# Patient Record
Sex: Male | Born: 1960 | Race: White | Hispanic: No | Marital: Married | Smoking: Never smoker
Health system: Southern US, Community
[De-identification: ages and names within clinical notes are randomized; demographics above are authoritative.]

## PROBLEM LIST (undated history)

## (undated) DIAGNOSIS — C801 Malignant (primary) neoplasm, unspecified: Secondary | ICD-10-CM

## (undated) DIAGNOSIS — E785 Hyperlipidemia, unspecified: Secondary | ICD-10-CM

## (undated) DIAGNOSIS — I10 Essential (primary) hypertension: Secondary | ICD-10-CM

## (undated) DIAGNOSIS — D649 Anemia, unspecified: Secondary | ICD-10-CM

## (undated) DIAGNOSIS — I2510 Atherosclerotic heart disease of native coronary artery without angina pectoris: Secondary | ICD-10-CM

## (undated) DIAGNOSIS — K2950 Unspecified chronic gastritis without bleeding: Secondary | ICD-10-CM

## (undated) DIAGNOSIS — N529 Male erectile dysfunction, unspecified: Secondary | ICD-10-CM

## (undated) DIAGNOSIS — T82858A Stenosis of vascular prosthetic devices, implants and grafts, initial encounter: Secondary | ICD-10-CM

## (undated) DIAGNOSIS — F419 Anxiety disorder, unspecified: Secondary | ICD-10-CM

## (undated) DIAGNOSIS — K3184 Gastroparesis: Secondary | ICD-10-CM

## (undated) DIAGNOSIS — K2270 Barrett's esophagus without dysplasia: Secondary | ICD-10-CM

## (undated) DIAGNOSIS — K861 Other chronic pancreatitis: Secondary | ICD-10-CM

## (undated) DIAGNOSIS — D45 Polycythemia vera: Secondary | ICD-10-CM

## (undated) DIAGNOSIS — B3781 Candidal esophagitis: Secondary | ICD-10-CM

## (undated) DIAGNOSIS — G4730 Sleep apnea, unspecified: Secondary | ICD-10-CM

## (undated) DIAGNOSIS — R112 Nausea with vomiting, unspecified: Secondary | ICD-10-CM

## (undated) DIAGNOSIS — K219 Gastro-esophageal reflux disease without esophagitis: Secondary | ICD-10-CM

## (undated) DIAGNOSIS — Z9889 Other specified postprocedural states: Secondary | ICD-10-CM

## (undated) DIAGNOSIS — T7840XA Allergy, unspecified, initial encounter: Secondary | ICD-10-CM

## (undated) HISTORY — DX: Barrett's esophagus without dysplasia: K22.70

## (undated) HISTORY — PX: NASAL SEPTUM SURGERY: SHX37

## (undated) HISTORY — DX: Other chronic pancreatitis: K86.1

## (undated) HISTORY — DX: Anemia, unspecified: D64.9

## (undated) HISTORY — DX: Unspecified chronic gastritis without bleeding: K29.50

## (undated) HISTORY — DX: Male erectile dysfunction, unspecified: N52.9

## (undated) HISTORY — DX: Hyperlipidemia, unspecified: E78.5

## (undated) HISTORY — DX: Sleep apnea, unspecified: G47.30

## (undated) HISTORY — DX: Gastroparesis: K31.84

## (undated) HISTORY — DX: Polycythemia vera: D45

## (undated) HISTORY — DX: Atherosclerotic heart disease of native coronary artery without angina pectoris: I25.10

## (undated) HISTORY — DX: Anxiety disorder, unspecified: F41.9

## (undated) HISTORY — PX: FINGER TENDON REPAIR: SHX1640

## (undated) HISTORY — PX: KNEE SURGERY: SHX244

## (undated) HISTORY — DX: Candidal esophagitis: B37.81

## (undated) HISTORY — PX: CARDIAC CATHETERIZATION: SHX172

## (undated) HISTORY — PX: CHOLECYSTECTOMY: SHX55

## (undated) HISTORY — DX: Gastro-esophageal reflux disease without esophagitis: K21.9

## (undated) HISTORY — DX: Allergy, unspecified, initial encounter: T78.40XA

## (undated) HISTORY — PX: APPENDECTOMY: SHX54

---

## 1985-07-31 HISTORY — PX: COLONOSCOPY: SHX174

## 2000-09-15 ENCOUNTER — Encounter: Admission: RE | Admit: 2000-09-15 | Discharge: 2000-09-15 | Payer: Self-pay | Admitting: Orthopedic Surgery

## 2000-09-15 ENCOUNTER — Encounter: Payer: Self-pay | Admitting: Orthopedic Surgery

## 2001-01-03 ENCOUNTER — Encounter: Payer: Self-pay | Admitting: Family Medicine

## 2001-01-03 ENCOUNTER — Ambulatory Visit (HOSPITAL_COMMUNITY): Admission: RE | Admit: 2001-01-03 | Discharge: 2001-01-03 | Payer: Self-pay | Admitting: Family Medicine

## 2001-01-26 ENCOUNTER — Inpatient Hospital Stay (HOSPITAL_COMMUNITY): Admission: EM | Admit: 2001-01-26 | Discharge: 2001-01-31 | Payer: Self-pay | Admitting: *Deleted

## 2001-01-26 ENCOUNTER — Encounter: Payer: Self-pay | Admitting: *Deleted

## 2001-01-30 ENCOUNTER — Encounter: Payer: Self-pay | Admitting: Family Medicine

## 2001-07-30 ENCOUNTER — Inpatient Hospital Stay (HOSPITAL_COMMUNITY): Admission: EM | Admit: 2001-07-30 | Discharge: 2001-08-05 | Payer: Self-pay | Admitting: *Deleted

## 2001-07-30 ENCOUNTER — Encounter: Payer: Self-pay | Admitting: *Deleted

## 2001-07-31 ENCOUNTER — Encounter: Payer: Self-pay | Admitting: Family Medicine

## 2001-09-19 ENCOUNTER — Ambulatory Visit (HOSPITAL_COMMUNITY): Admission: RE | Admit: 2001-09-19 | Discharge: 2001-09-19 | Payer: Self-pay | Admitting: Internal Medicine

## 2001-09-19 HISTORY — PX: ESOPHAGOGASTRODUODENOSCOPY: SHX1529

## 2001-09-21 ENCOUNTER — Encounter (INDEPENDENT_AMBULATORY_CARE_PROVIDER_SITE_OTHER): Payer: Self-pay | Admitting: Internal Medicine

## 2001-09-21 ENCOUNTER — Ambulatory Visit (HOSPITAL_COMMUNITY): Admission: RE | Admit: 2001-09-21 | Discharge: 2001-09-21 | Payer: Self-pay | Admitting: Internal Medicine

## 2001-11-07 ENCOUNTER — Ambulatory Visit (HOSPITAL_COMMUNITY): Admission: RE | Admit: 2001-11-07 | Discharge: 2001-11-07 | Payer: Self-pay | Admitting: Internal Medicine

## 2001-11-07 ENCOUNTER — Encounter (INDEPENDENT_AMBULATORY_CARE_PROVIDER_SITE_OTHER): Payer: Self-pay | Admitting: Internal Medicine

## 2002-04-03 ENCOUNTER — Ambulatory Visit (HOSPITAL_COMMUNITY): Admission: RE | Admit: 2002-04-03 | Discharge: 2002-04-03 | Payer: Self-pay | Admitting: Internal Medicine

## 2002-04-03 ENCOUNTER — Encounter (INDEPENDENT_AMBULATORY_CARE_PROVIDER_SITE_OTHER): Payer: Self-pay | Admitting: Internal Medicine

## 2002-08-07 ENCOUNTER — Encounter: Payer: Self-pay | Admitting: Family Medicine

## 2002-08-07 ENCOUNTER — Ambulatory Visit (HOSPITAL_COMMUNITY): Admission: RE | Admit: 2002-08-07 | Discharge: 2002-08-07 | Payer: Self-pay | Admitting: Family Medicine

## 2002-09-07 ENCOUNTER — Emergency Department (HOSPITAL_COMMUNITY): Admission: EM | Admit: 2002-09-07 | Discharge: 2002-09-07 | Payer: Self-pay | Admitting: Emergency Medicine

## 2003-07-22 ENCOUNTER — Emergency Department (HOSPITAL_COMMUNITY): Admission: EM | Admit: 2003-07-22 | Discharge: 2003-07-22 | Payer: Self-pay | Admitting: Emergency Medicine

## 2003-08-01 ENCOUNTER — Ambulatory Visit (HOSPITAL_COMMUNITY): Admission: RE | Admit: 2003-08-01 | Discharge: 2003-08-01 | Payer: Self-pay | Admitting: Family Medicine

## 2003-08-30 ENCOUNTER — Emergency Department (HOSPITAL_COMMUNITY): Admission: EM | Admit: 2003-08-30 | Discharge: 2003-08-30 | Payer: Self-pay | Admitting: Emergency Medicine

## 2004-01-20 ENCOUNTER — Ambulatory Visit (HOSPITAL_COMMUNITY): Admission: RE | Admit: 2004-01-20 | Discharge: 2004-01-20 | Payer: Self-pay | Admitting: Internal Medicine

## 2004-08-23 HISTORY — PX: CORONARY ANGIOPLASTY WITH STENT PLACEMENT: SHX49

## 2004-09-10 ENCOUNTER — Ambulatory Visit: Payer: Self-pay | Admitting: Internal Medicine

## 2004-10-15 ENCOUNTER — Inpatient Hospital Stay (HOSPITAL_COMMUNITY): Admission: EM | Admit: 2004-10-15 | Discharge: 2004-10-24 | Payer: Self-pay | Admitting: Cardiology

## 2004-10-15 ENCOUNTER — Ambulatory Visit: Payer: Self-pay | Admitting: Cardiology

## 2004-10-15 ENCOUNTER — Emergency Department (HOSPITAL_COMMUNITY): Admission: RE | Admit: 2004-10-15 | Discharge: 2004-10-15 | Payer: Self-pay | Admitting: Family Medicine

## 2004-10-28 ENCOUNTER — Ambulatory Visit (HOSPITAL_COMMUNITY): Admission: RE | Admit: 2004-10-28 | Discharge: 2004-10-28 | Payer: Self-pay | Admitting: Family Medicine

## 2004-11-02 ENCOUNTER — Encounter
Admission: RE | Admit: 2004-11-02 | Discharge: 2004-11-02 | Payer: Self-pay | Admitting: Thoracic Surgery (Cardiothoracic Vascular Surgery)

## 2004-11-16 ENCOUNTER — Ambulatory Visit: Payer: Self-pay | Admitting: Cardiology

## 2004-11-23 ENCOUNTER — Encounter
Admission: RE | Admit: 2004-11-23 | Discharge: 2004-11-23 | Payer: Self-pay | Admitting: Thoracic Surgery (Cardiothoracic Vascular Surgery)

## 2004-11-24 ENCOUNTER — Observation Stay (HOSPITAL_COMMUNITY)
Admission: RE | Admit: 2004-11-24 | Discharge: 2004-11-25 | Payer: Self-pay | Admitting: Thoracic Surgery (Cardiothoracic Vascular Surgery)

## 2004-11-24 ENCOUNTER — Encounter (INDEPENDENT_AMBULATORY_CARE_PROVIDER_SITE_OTHER): Payer: Self-pay | Admitting: *Deleted

## 2004-11-24 HISTORY — PX: CORONARY ARTERY BYPASS GRAFT: SHX141

## 2005-01-26 ENCOUNTER — Ambulatory Visit: Payer: Self-pay | Admitting: Cardiology

## 2005-03-15 ENCOUNTER — Encounter
Admission: RE | Admit: 2005-03-15 | Discharge: 2005-03-15 | Payer: Self-pay | Admitting: Thoracic Surgery (Cardiothoracic Vascular Surgery)

## 2005-06-15 ENCOUNTER — Ambulatory Visit: Payer: Self-pay | Admitting: Internal Medicine

## 2005-07-26 ENCOUNTER — Ambulatory Visit: Payer: Self-pay | Admitting: Cardiology

## 2006-01-11 ENCOUNTER — Ambulatory Visit: Payer: Self-pay | Admitting: Internal Medicine

## 2006-03-15 ENCOUNTER — Ambulatory Visit (HOSPITAL_COMMUNITY): Admission: RE | Admit: 2006-03-15 | Discharge: 2006-03-15 | Payer: Self-pay | Admitting: Family Medicine

## 2006-05-16 ENCOUNTER — Encounter
Admission: RE | Admit: 2006-05-16 | Discharge: 2006-05-16 | Payer: Self-pay | Admitting: Thoracic Surgery (Cardiothoracic Vascular Surgery)

## 2006-05-23 ENCOUNTER — Encounter
Admission: RE | Admit: 2006-05-23 | Discharge: 2006-05-23 | Payer: Self-pay | Admitting: Thoracic Surgery (Cardiothoracic Vascular Surgery)

## 2006-05-30 ENCOUNTER — Encounter: Payer: Self-pay | Admitting: Emergency Medicine

## 2006-05-30 ENCOUNTER — Ambulatory Visit: Payer: Self-pay | Admitting: Cardiology

## 2006-05-30 ENCOUNTER — Observation Stay (HOSPITAL_COMMUNITY): Admission: AD | Admit: 2006-05-30 | Discharge: 2006-06-01 | Payer: Self-pay | Admitting: Cardiology

## 2006-06-21 ENCOUNTER — Ambulatory Visit: Payer: Self-pay | Admitting: Cardiology

## 2006-07-01 ENCOUNTER — Ambulatory Visit: Payer: Self-pay | Admitting: Internal Medicine

## 2006-10-20 ENCOUNTER — Ambulatory Visit: Payer: Self-pay | Admitting: Cardiology

## 2006-10-20 ENCOUNTER — Ambulatory Visit: Payer: Self-pay | Admitting: *Deleted

## 2006-11-10 ENCOUNTER — Ambulatory Visit: Payer: Self-pay | Admitting: Cardiology

## 2007-04-13 ENCOUNTER — Ambulatory Visit: Payer: Self-pay | Admitting: Internal Medicine

## 2007-04-13 ENCOUNTER — Inpatient Hospital Stay (HOSPITAL_COMMUNITY): Admission: EM | Admit: 2007-04-13 | Discharge: 2007-04-17 | Payer: Self-pay | Admitting: Cardiology

## 2007-04-13 ENCOUNTER — Encounter: Payer: Self-pay | Admitting: Emergency Medicine

## 2007-05-09 ENCOUNTER — Ambulatory Visit: Payer: Self-pay | Admitting: Cardiology

## 2007-09-29 ENCOUNTER — Ambulatory Visit: Payer: Self-pay | Admitting: Cardiology

## 2009-04-10 ENCOUNTER — Ambulatory Visit (HOSPITAL_COMMUNITY): Admission: RE | Admit: 2009-04-10 | Discharge: 2009-04-10 | Payer: Self-pay | Admitting: Family Medicine

## 2009-05-20 ENCOUNTER — Ambulatory Visit: Payer: Self-pay | Admitting: Cardiology

## 2009-05-20 ENCOUNTER — Telehealth: Payer: Self-pay | Admitting: Cardiology

## 2009-05-20 ENCOUNTER — Observation Stay (HOSPITAL_COMMUNITY): Admission: RE | Admit: 2009-05-20 | Discharge: 2009-05-21 | Payer: Self-pay | Admitting: Cardiology

## 2009-05-20 ENCOUNTER — Encounter: Payer: Self-pay | Admitting: Cardiology

## 2009-05-20 DIAGNOSIS — I2510 Atherosclerotic heart disease of native coronary artery without angina pectoris: Secondary | ICD-10-CM | POA: Insufficient documentation

## 2009-05-20 DIAGNOSIS — R079 Chest pain, unspecified: Secondary | ICD-10-CM

## 2009-05-20 DIAGNOSIS — E119 Type 2 diabetes mellitus without complications: Secondary | ICD-10-CM | POA: Insufficient documentation

## 2009-05-20 DIAGNOSIS — E785 Hyperlipidemia, unspecified: Secondary | ICD-10-CM

## 2009-05-21 ENCOUNTER — Encounter: Payer: Self-pay | Admitting: Cardiology

## 2009-05-30 ENCOUNTER — Encounter: Payer: Self-pay | Admitting: Cardiology

## 2009-06-10 ENCOUNTER — Telehealth: Payer: Self-pay | Admitting: Cardiology

## 2010-08-25 ENCOUNTER — Ambulatory Visit: Admit: 2010-08-25 | Payer: Self-pay | Admitting: Internal Medicine

## 2010-09-13 ENCOUNTER — Encounter: Payer: Self-pay | Admitting: Thoracic Surgery (Cardiothoracic Vascular Surgery)

## 2010-09-22 ENCOUNTER — Ambulatory Visit
Admission: RE | Admit: 2010-09-22 | Discharge: 2010-09-22 | Payer: Self-pay | Source: Home / Self Care | Attending: Internal Medicine | Admitting: Internal Medicine

## 2010-09-27 NOTE — Consult Note (Signed)
NAME:  [name]             ACCOUNT NO.:  [id number]  MEDICAL RECORD NO.:  [id number]           PATIENT TYPE:  LOCATION:                                 FACILITY:  PHYSICIAN:  [name], M.D.    DATE OF BIRTH:  04-19-1961  DATE OF CONSULTATION: DATE OF DISCHARGE:                                CONSULTATION   PRESENTING COMPLAINT:  Followup for chronic pancreatitis secondary to hypertriglyceridemia, chronic GERD/gastroparesis.  Last visit in [name] 2010 in [place].  [name] is 50 year old Caucasian male patient of Dr. Katharine Look who is here for scheduled visit.  He states he has been having intermittent pain in his left upper quadrant.  He had one such episode in [name]. He had back pain and nausea and symptoms lasted for 3 days.  He had a single episode of vomiting.  When this occurs, he goes on liquid diet and slowly he gets better.  He had milder episodes.  He has not been to Dr. Michelle Nas[facility] and emergency room during these visits.  He is presently taking Levaquin for prostatitis and sinusitis.  He continues to experience left-sided chest and shoulder and neck pain felt to be musculoskeletal and now rarely using naproxen.  He has a copy of blood work from June 23, 2010, his triglycerides is 847.  He is due to have blood work next week.  He states triglyceride level prior to this was close to 1100.  He denies fever, chills, or vomiting.  He did have a CAT scan by Dr. Sudie Bailey in August of 2010, and no abnormality was noted to his pancreas.  CURRENT MEDICATIONS: 1. Enalapril 2.5 mg daily. 2. Metoprolol 25 mg daily. 3. Lantus 48 units every night. 4. Pravastatin 40 mg daily. 5. Paroxetine 10 mg daily. 6. Plavix 37.5 mg daily. 7. Gemfibrozil 300 mg daily. 8. Pancrelipase 5000 three capsules each meal, one with snack. 9. Glipizide 10 mg b.i.d. 10.Metformin 1 g b.i.d. 11.Domperidone 10 mg b.i.d. 12.Nitroglycerin 0.4 mg sublingual  p.r.n. 13.Levofloxacin 500 mg daily. 14.Naproxen 500 mg daily p.r.n. which is occasional or Aleve 220 mg     daily p.r.n. 15.Prevacid 30 mg daily p.r.n. 16.Hydrocodone/APAP 7.5/650 b.i.d. p.r.n. 17.Diazepam 10 mg daily p.r.n. 18.Co Q10 one daily. 19.Vitamin D 50,000 units once a week.  OBJECTIVE:  VITAL SIGNS:  Weight 171 pounds, which is stable, he is 69- 1/2 inches tall, pulse 72 per minute, blood pressure 120/70, temp is 97.8. HEENT:  Conjunctivae is pink.  Sclerae nonicteric.  Oropharyngeal mucosa is normal.  No neck masses are noted. ABDOMEN:  Symmetrical.  Bowel sounds are normal.  He has mild tenderness in left upper quadrant and midepigastric region, but no organomegaly or masses noted. EXTREMITIES:  He does not have peripheral edema or clubbing.  LABORATORY DATA:  From June 23, 2010, bilirubin is 0.9, AP 53, SGOT 24, SGPT 15, albumin 4.2.  His cholesterol was 275, triglycerides 847, HDL 32.  His hemoglobin A1c was 7.9.  ASSESSMENT:  [name] has chronic pancreatitis secondary to hypertriglyceridemia complicated by pseudocysts, but these have completely resolved.  His last CT was over a year ago.  He may be experiencing mild episode of pancreatitis which is not surprising, given his triglyceride level is still very high.  Ideally, he should get it below 500, but at least keep it below 1000.  The risk of pancreatitis increases significantly with levels above 1000.  He is on subtherapeutic dose of gemfibrozil.  He is intolerant of NIACIN.  PLAN:  The patient advised to increase his gemfibrozil to at least 300 mg b.i.d.  He needs to go back to his walking or exercise schedule.  If he has another episode of severe epigastric pain, we will check his amylase and perhaps consider CT.  We will wait to get copy of his planned lipid profile.  Unless he has problems, he will return for OV in 1 year.     [name], M.D.     NR/MEDQ  D:  09/23/2010  T:  09/23/2010  Job:   [id number]  cc:   Mila Homer. Sudie Bailey, M.D. Fax: [phone]  Electronically Signed by [name] M.D. on 09/27/2010 01:33:36 PM

## 2010-10-14 ENCOUNTER — Ambulatory Visit: Payer: Self-pay | Admitting: Cardiology

## 2010-11-12 ENCOUNTER — Encounter: Payer: Self-pay | Admitting: Cardiology

## 2010-11-13 ENCOUNTER — Encounter: Payer: Self-pay | Admitting: Cardiology

## 2010-11-13 ENCOUNTER — Encounter: Payer: Self-pay | Admitting: *Deleted

## 2010-11-23 ENCOUNTER — Ambulatory Visit (INDEPENDENT_AMBULATORY_CARE_PROVIDER_SITE_OTHER): Payer: Medicare Other | Admitting: Cardiology

## 2010-11-23 ENCOUNTER — Encounter: Payer: Self-pay | Admitting: Cardiology

## 2010-11-23 VITALS — BP 140/90 | HR 64 | Ht 69.0 in | Wt 172.0 lb

## 2010-11-23 DIAGNOSIS — I2510 Atherosclerotic heart disease of native coronary artery without angina pectoris: Secondary | ICD-10-CM

## 2010-11-23 DIAGNOSIS — R079 Chest pain, unspecified: Secondary | ICD-10-CM

## 2010-11-23 DIAGNOSIS — E785 Hyperlipidemia, unspecified: Secondary | ICD-10-CM

## 2010-11-23 NOTE — Patient Instructions (Signed)
Follow up in 12 months with Dr Antoine Poche

## 2010-11-23 NOTE — Assessment & Plan Note (Signed)
He has not anginal musculoskeletal pain. No further workup is planned. He does feel occasional pounding heartbeats at night. He is reminded to take his p.m. Beta blocker which he sometimes forgets.

## 2010-11-23 NOTE — Assessment & Plan Note (Signed)
A stress perfusion study demonstrated no new ischemia in 2010. No change in therapy is indicated and he will continue with risk reduction. No further imaging is indicated.

## 2010-11-23 NOTE — Progress Notes (Signed)
HPI The patient presents for followup of his known coronary disease. Since I last saw him he has had no new cardiovascular problems. He did have a catheterization in 2010 with results described below. He has continued to have some sternal chest discomfort but no anginal pain. It actually been treated with nonsteroidals with some relief. He denies any new chest pressure, neck or arm discomfort. He denies any palpitations, presyncope or syncope. He has no weight gain or edema.  He has no new SOB, PND or orthopnea.    Allergies  Allergen Reactions  . Iodinated Diagnostic Agents   . Iohexol      Desc: HIVES,SOB NEEDS PREP.MEDS   . Penicillins   . Sulfonamide Derivatives     Current Outpatient Prescriptions  Medication Sig Dispense Refill  . aspirin 81 MG tablet Take 81 mg by mouth daily.        . clopidogrel (PLAVIX) 75 MG tablet Take 75 mg by mouth daily.        . diazepam (VALIUM) 10 MG tablet Take 10 mg by mouth. Take 1 tablet three times a day       . enalapril (VASOTEC) 10 MG tablet Take 2.5 mg by mouth daily.       Marland Kitchen gemfibrozil (LOPID) 600 MG tablet Take 300 mg by mouth daily.        Marland Kitchen glipiZIDE (GLUCOTROL) 10 MG tablet Take 10 mg by mouth 2 (two) times daily.        . hydrocodone-acetaminophen (LORCET PLUS) 7.5-650 MG per tablet Take 1 tablet by mouth every 6 (six) hours as needed.        . insulin glargine (LANTUS) 100 UNIT/ML injection Inject 35 Units into the skin at bedtime.        . lansoprazole (PREVACID) 30 MG capsule Take 30 mg by mouth as needed.        Marland Kitchen levofloxacin (LEVAQUIN) 500 MG tablet Take 500 mg by mouth daily.        . metFORMIN (GLUCOPHAGE) 1000 MG tablet Take 1,000 mg by mouth 2 (two) times daily.        . metoprolol (LOPRESSOR) 100 MG tablet Take 25 mg by mouth daily.       . naproxen sodium (ANAPROX) 220 MG tablet Take 220 mg by mouth as needed.        . nitroGLYCERIN (NITROSTAT) 0.4 MG SL tablet Place 0.4 mg under the tongue every 5 (five) minutes as needed.         . NON FORMULARY Take 10 mg by mouth daily. Motilium       . PANCRELIPASE, LIP-PROT-AMYL, 5000 UNITS CPEP Take 5,000 Units by mouth. Take 3 tablets before each meal and 2 tablets with a snack       . PARoxetine (PAXIL) 10 MG tablet Take 10 mg by mouth daily.        . pravastatin (PRAVACHOL) 40 MG tablet Take 40 mg by mouth at bedtime.        . promethazine (PHENERGAN) 25 MG tablet Take 25 mg by mouth as needed.        Marland Kitchen DISCONTD: niacin (NIASPAN) 500 MG CR tablet Take 1,000 mg by mouth at bedtime.        Marland Kitchen DISCONTD: Omega-3 Fatty Acids (FISH OIL MAXIMUM STRENGTH) 1200 MG CAPS Take 1 capsule by mouth at bedtime.        Marland Kitchen DISCONTD: RABEprazole (ACIPHEX) 20 MG tablet Take 20 mg by mouth daily.  Past Medical History  Diagnosis Date  . Coronary artery disease     Patent Stent to Circ.  Patent coronary bypass grafts with a free radial graft to left  . Diabetes mellitus   . Pancreatitis chronic     with pseudocyst  . Dyslipidemia     poorly controlled, probably related to his DM (Some of this is secondary to his inability to afford medications).  . ED (erectile dysfunction)     Past Surgical History  Procedure Date  . Appendectomy   . Cholecystectomy   . Knee surgery     left knee  . Nasal septum surgery   . Finger tendon repair     in middle finger  . Coronary artery bypass graft 11/24/2004  . Cardiac catheterization     ROS  PHYSICAL EXAM BP 140/90  Pulse 64  Ht 5\' 9"  (1.753 m)  Wt 172 lb (78.019 kg)  BMI 25.40 kg/m2 GENERAL:  Well appearing HEENT:  Pupils equal round and reactive, fundi not visualized, oral mucosa unremarkable NECK:  No jugular venous distention, waveform within normal limits, carotid upstroke brisk and symmetric, no bruits, no thyromegaly LYMPHATICS:  No cervical, inguinal adenopathy LUNGS:  Clear to auscultation bilaterally BACK:  No CVA tenderness CHEST:  Well healed sternotomy scar. HEART:  PMI not displaced or sustained,S1 and S2 within  normal limits, no S3, no S4, no clicks, no rubs, no murmurs ABD:  Flat, positive bowel sounds normal in frequency in pitch, no bruits, no rebound, no guarding, no midline pulsatile mass, no hepatomegaly, no splenomegaly EXT:  2 plus pulses throughout, no edema, no cyanosis no clubbing SKIN:  No rashes no nodules NEURO:  Cranial nerves II through XII grossly intact, motor grossly intact throughout PSYCH:  Cognitively intact, oriented to person place and time   EKG:  Normal sinus rhythm, rate 64, incomplete right bundle branch block, no acute ST-T wave changes  ASSESSMENT AND PLAN

## 2010-11-23 NOTE — Assessment & Plan Note (Signed)
I don't have his most recent lipid profile. However, he is on combination therapy and it appears that he is getting very aggressive therapy.

## 2010-11-25 ENCOUNTER — Encounter: Payer: Self-pay | Admitting: Cardiology

## 2010-11-27 LAB — TSH: TSH: 1.794 u[IU]/mL (ref 0.350–4.500)

## 2010-11-27 LAB — COMPREHENSIVE METABOLIC PANEL
AST: 36 U/L (ref 0–37)
CO2: 28 mEq/L (ref 19–32)
Calcium: 9.1 mg/dL (ref 8.4–10.5)
Chloride: 105 mEq/L (ref 96–112)
Creatinine, Ser: 0.73 mg/dL (ref 0.4–1.5)
GFR calc Af Amer: >60 mL/min (ref >60)
GFR calc non Af Amer: >60 mL/min (ref >60)
Glucose, Bld: 100 mg/dL — ABNORMAL HIGH (ref 70–99)
Total Bilirubin: 1.7 mg/dL — ABNORMAL HIGH (ref 0.3–1.2)

## 2010-11-27 LAB — GLUCOSE, CAPILLARY
Glucose-Capillary: 281 mg/dL — ABNORMAL HIGH (ref 70–99)
Glucose-Capillary: 340 mg/dL — ABNORMAL HIGH (ref 70–99)
Glucose-Capillary: 392 mg/dL — ABNORMAL HIGH (ref 70–99)
Glucose-Capillary: 88 mg/dL (ref 70–99)

## 2010-11-27 LAB — LIPID PANEL: Cholesterol: 221 mg/dL — ABNORMAL HIGH (ref 0–200)

## 2010-11-27 LAB — CBC
HCT: 49.2 % (ref 39.0–52.0)
HCT: 54.4 % — ABNORMAL HIGH (ref 39.0–52.0)
Hemoglobin: 16.9 g/dL (ref 13.0–17.0)
MCHC: 33.8 g/dL (ref 30.0–36.0)
MCHC: 34.3 g/dL (ref 30.0–36.0)
MCV: 86.5 fL (ref 78.0–100.0)
MCV: 87.4 fL (ref 78.0–100.0)
RBC: 5.68 MIL/uL (ref 4.22–5.81)
RBC: 6.22 MIL/uL — ABNORMAL HIGH (ref 4.22–5.81)
WBC: 4.6 10*3/uL (ref 4.0–10.5)
WBC: 5.5 10*3/uL (ref 4.0–10.5)

## 2010-11-27 LAB — CARDIAC PANEL(CRET KIN+CKTOT+MB+TROPI): Relative Index: INVALID (ref 0.0–2.5)

## 2010-11-27 LAB — APTT: aPTT: 29 seconds (ref 24–37)

## 2010-11-27 LAB — HEMOGLOBIN A1C: Hgb A1c MFr Bld: 7.7 % — ABNORMAL HIGH (ref 4.6–6.1)

## 2010-11-27 LAB — PROTIME-INR: INR: 0.9 (ref 0.00–1.49)

## 2011-01-05 NOTE — Cardiovascular Report (Signed)
NAMEDAYTON, KENLEY NO.:  [id number]   MEDICAL RECORD NO.:  [id number]           PATIENT TYPE:   LOCATION:                                 FACILITY:   PHYSICIAN:  Rollene Rotunda, MD, FACCDATE OF BIRTH:  November 06, 1960   DATE OF PROCEDURE:  04/17/2007  DATE OF DISCHARGE:                            CARDIAC CATHETERIZATION   PRIMARY CARE PHYSICIAN:  Dr. John Giovanni.   CARDIOLOGIST:  Dr. Antoine Poche.   PROCEDURE:  Left heart catheterization/coronary arteriography.   INDICATIONS:  A patient with chest pain suggestive of his previous  unstable angina.  He had previous bypass grafting and previous stenting  of his native circumflex in the AV groove.   PROCEDURE NOTE:  Left heart catheterization performed.  The right  femoral artery was cannulated using the anterior wall puncture.  A #6-  French arterial sheath was inserted via the modified Seldinger  technique.  A preformed Judkins and a pigtail catheter were utilized.  The patient tolerated the procedure well and left the lab in stable  condition.   RESULTS:  Hemodynamics LV 124/23, AO 121/90.  Coronaries:  The left main  was normal.  The LAD was large, wrapping the apex.  There was a long  proximal 80% stenosis.  There was mid 30% stenosis.  There was diffuse  apical moderate plaque.  A mid diagonal was moderate with 60% proximal  stenosis, a second diagonal was large and normal.  The circumflex in the  AV groove was a dominant vessel.  There was a patent stent in the mid  segment.  First and second obtuse marginal were small and normal.  Third  and fourth obtuse marginal were moderate size and normal.  The  posterolateral was large and it occluded at the ostium.  It was seen to  fill via vein graft.  The PDA was small to moderate size and normal.  The right coronary artery is a nondominant vessel with a mid 99%  stenosis.   Grafts:  A left radial to the posterolateral was widely patent.  LIMA to  the LAD was  widely patent.   Left ventriculogram:  Left ventriculogram was obtained in the RAO  projection.  The EF was 60% with mild inferior hypokinesis.   CONCLUSION:  Native three-vessel coronary artery disease.  Patent bypass  grafts.  Well-preserved ejection fraction.   PLAN:  The patient's pain is either nonanginal, as there is no objective  evidence of ischemia, or it could be related to small vessel disease.  He will continue with aggressive medical management and risk reduction.  I have discussed with Dr. Sudie Bailey the need to continue to rule out  nonanginal causes.      Rollene Rotunda, MD, [facility]  Electronically Signed     JH/MEDQ  D:  04/17/2007  T:  04/17/2007  Job:  [id number]   cc:   Mila Homer. Sudie Bailey, M.D.

## 2011-01-05 NOTE — Discharge Summary (Signed)
NAMEDANDRAE, KUSTRA NO.:  [id number]   MEDICAL RECORD NO.:  [id number]          PATIENT TYPE:  INP   LOCATION:  2028                         FACILITY:  [facility]   PHYSICIAN:  Rollene Rotunda, MD, FACCDATE OF BIRTH:  1960/09/30   DATE OF ADMISSION:  04/13/2007  DATE OF DISCHARGE:  04/17/2007                               DISCHARGE SUMMARY   PRIMARY CARDIOLOGIST:  Rollene Rotunda, MD, [facility]   PRIMARY CARE PHYSICIAN:  Mila Homer. Sudie Bailey, M.D.   DISCHARGE DIAGNOSIS:  Chest pain.   SECONDARY DIAGNOSES:  1. Coronary artery disease.      a.     Status post CABG x2.      b.     Status post PCI and stenting of the PDA with bare metal       stent in October 2007.  2. Hyperlipidemia.  3. Type 2 diabetes mellitus.  4. Chronic pancreatitis.  5. Erectile dysfunction.  6. GERD.  7. Hypertriglyceridemia.   ALLERGIES:  PENICILLIN, SULFA, REGLAN, INTOLERANCE TO SOME STATINS.   PROCEDURES:  Left heart cardiac catheterization.   HISTORY OF PRESENT ILLNESS:  A 50 year old Caucasian male with prior  history of CAD as outlined above who was in his usual state of health  until the morning of April 13, 2007, when he had mid sternal chest  discomfort with radiation to the left arm and left neck with associated  nausea.  Symptoms lasted approximately 2 hours prior to resolving with  sublingual nitroglycerin.  Unfortunately, had recurrent symptoms  prompting him to present to the [facility] ED.  He notes that his pain  was similar to his previous MI pain.  Cardiac markers were negative in  the ED and he was admitted for further evaluation.   HOSPITAL COURSE:  He had recurrent symptoms while hospitalized despite  ruling out.  Decision was made to perform cardiac catheterization.  He  had no recurrent pain over the weekend, and this morning underwent left  heart cardiac catheterization revealing patency of his LIMA to the LAD  as well as radial graft to the PL.  He had multivessel  native coronary  artery disease which was stable.  EF was 60% with mild inferior  hypokinesis.  Post catheterization he has not had recurrent symptoms and  he has been ambulating without difficulty.  He will be discharged home  today in satisfactory condition.   DISCHARGE LABORATORIES:  Hemoglobin 17.6, hematocrit 51.3, WBC 6.4,  platelets 187, MCV 82.9, neutrophils 63, sodium 134, potassium 4.5,  chloride 99, CO2 26, BUN 18, creatinine 1.01, glucose 256.  Total  bilirubin 123, alkaline phosphatase 41, AST 28, ALT 19, amylase 63,  albumin 3.6, CK 174, MB 0.9, troponin I 0.01, total cholesterol 163,  triglycerides 843, HDL 29, LDL unable to be calculated, calcium 9.6,  hemoglobin A1c 7.8.   DISPOSITION:  The patient is being discharged home today in good  condition.   FOLLOW-UP PLANS AND APPOINTMENTS:  He has follow-up with Dr. Antoine Poche on  September 16 at 3:45 p.m..  He is asked to follow-up Dr. Sudie Bailey as  previously scheduled.  DISCHARGE MEDICATIONS:  1. Fenofibrate 48 mg daily (new).  2. Lantus 42 units q.h.s.  3. Metoprolol 50 mg half tablet b.i.d.  4. Vytorin 10/04 mg daily.  5. Domperidone 10 mg one tablet b.i.d.  6. Glipizide 10 mg b.i.d.  7. Enalapril 2.5 mg daily.  8. Nitroglycerin 0.4 mg sublingual p.r.n. chest pain.  9. Prevacid 30 mg daily.  10.Aspirin 81 mg daily.  11.Fish oil one tablet daily.  12.Paxil 10 mg daily.  13.Plavix 75 mg daily.  14.Lipram 4500, three caps with meals, one to two caps with snacks.  15.Metformin 1000 mg b.i.d. to be resumed April 19, 2007.  16.Hydrocodone, Naproxen, diazepam and promethazine all as previously      prescribed.   PENDING LABORATORY STUDIES:  None.   DURATION DISCHARGE ENCOUNTER:  Thirty eight minutes including physician  time.      Nicolasa Ducking, ANP      Rollene Rotunda, MD, [facility]  Electronically Signed    CB/MEDQ  D:  04/17/2007  T:  04/17/2007  Job:  [id number]   cc:   Mila Homer. Sudie Bailey, M.D.

## 2011-01-05 NOTE — H&P (Signed)
[name], HOLTSCLAW NO.:  [id number]   MEDICAL RECORD NO.:  [id number]          PATIENT TYPE:  INP   LOCATION:  2904                         FACILITY:  [facility]   PHYSICIAN:  Bevelyn Buckles. Bensimhon, MDDATE OF BIRTH:  Nov 01, 1960   DATE OF ADMISSION:  04/13/2007  DATE OF DISCHARGE:                              HISTORY & PHYSICAL   PRIMARY CARDIOLOGIST:  Dr. Angelina Sheriff.   PRIMARY CARE PHYSICIAN:  Dr. Sudie Bailey in [place].   HISTORY OF PRESENT ILLNESS:  This is a 50 year old Caucasian male with  known history of coronary artery disease, coronary artery bypass graft  with left internal mammary artery to left anterior descending and left  radial artery to left posterolateral branch, with history of  percutaneous coronary intervention to the posterior descending artery in  2007, with also history of insulin-dependent diabetes mellitus, chronic  pancreatitis, and hyperlipidemia.  The patient began to feel some dull,  midsternal left-sided chest pain radiating to the left rib and left arm  today. However, he has been having some nagging pain over the last  couple of weeks in his chest, which he has ignored.  The patient has  chronic pancreatitis, and felt it was related to that.  The patient also  states that he did some yard work about a week ago, doing some hoeing,  et [employer], outside, and he was asymptomatic at that time.  Today the  patient has noticed some increasing intensity of the left arm pain, left-  sided chest pain, and radiating now to the left neck with numbness onto  the left side of his head, and associated nausea.  The pain lasted  approximately 2 hours this morning before the patient took a  nitroglycerin sublingually.  He said that it decreased the pain and  helped for about 10-15 minutes, but then the pain returned and increased  in intensity.  The patient waited an additional 2 hours and took a  second nitroglycerin because he was nauseated, and  did not want to put  anything in his stomach.  He did take the second nitroglycerin and it  did help transiently.  Right afterward, he had what he described as  almost falling.  The patient states that he went down to his knees, that  he broke the fall by catching himself before hitting the floor, and  regained his strength and sat in a chair for a few minutes until the  dizziness passed.  He thought it was related to taking the second  nitroglycerin.  The pain recurred and his dizziness went away.  He got  up and took a shower, got dressed, and walked about in his home, and the  pain continued to be nagging.  He went to a neighbor's house to take his  dog there to have them look after it, and drove himself to the emergency  room at [facility].  The patient was seen at [facility], and had an EKG  completed, revealing no acute ST-T wave abnormalities.  He was given  morphine IV, heparin, nitroglycerin, aspirin, and Lopressor 5 mg IV.  The pain was relieved.  The patient also admits to being on a recent  course of antibiotics for sinusitis and prostatitis, which he states was  Levaquin through his primary care physician.  The pain he describes, he  states was similar to pain prior to his coronary artery bypass graft and  stent 2 years ago.  As a result of his history and recurrent discomfort,  the patient was transferred to [facility] for further evaluation and  continued treatment.   The patient currently is complaining of some mild soreness in his chest  but no acute frank pain at this time.   REVIEW OF SYSTEMS:  Positive for some mild diaphoresis, headache, chest  pain, mild shortness of breath, one episode of near-syncope, but no loss  of consciousness.  He complains of numbness in his head and the left  side of his neck with associated nausea.  All other systems are reviewed  and found to be negative.   PAST MEDICAL HISTORY:  1. Includes coronary artery disease with severe 3-vessel  coronary      artery disease found by cardiac catheterization in 2006, with      subsequent coronary artery bypass grafting in 2006, with left      internal mammary artery to left anterior descending and left radial      artery to left posterolateral branch.  At that time, the      catheterization revealed a 40% circumflex stenosis, 80% posterior      descending artery, and right coronary artery being nondominant.      The patient had subsequent bare metal stent to the posterior      descending artery secondary to the 80% stenosis in 2007 per Dr.      Samule Ohm.  2. The patient has history of diabetes.  3. Chronic pancreatitis.  4. Hyperlipidemia.  5. Erectile dysfunction.   PAST SURGICAL HISTORY:  1. Coronary artery bypass grafting.  2. Appendectomy.  3. Cholecystectomy.  4. Left knee surgery.   SOCIAL HISTORY:  The patient lives in [place] with his wife.  He is  disabled secondary to coronary artery disease and pancreatitis.  He does  not smoke, he does not drink alcohol.  There is no drug use.   FAMILY HISTORY:  Nonsignificant secondary to current diagnoses.   CURRENT MEDICATIONS:  1. Metoprolol 25 mg b.i.d.  2. Lipram once a day.  3. Paroxetine 10 mg daily.  4. Glipizide 10 mg b.i.d.  5. Metformin 1000 mg b.i.d.  6. Lantus insulin 35 units subcutaneously daily.  7. Prevacid 30 mg daily.  8. Vytorin 10/40 mg once a day.  9. Enalapril 2.5 mg daily.  10.Plavix 75mg  daily.  11.Aspirin 81 mg daily.  12.Niacin 500 mg once a day.  13.Diazepam 10 mg t.i.d.  14.Hydrocodone 7.5/650 mg t.i.d. p.r.n.   ALLERGIES:  1. To intravenous contrast dye.  2. Penicillin.  3. Sulfa.  4. Reglan.  5. Intolerance to statins.   CURRENT LABORATORY DATA:  Sodium 136, potassium 3.8, chloride 106, CO2  22, BUN 15, creatinine 0.78, glucose 121.  Hemoglobin 17.6, hematocrit  51.3, white blood cell count 6.4, platelets 187.  CK 54, MB 1.2,  troponin 0.03.  Point of care markers:  Myoglobin  45.4, MB less than  1.0, troponin less than 0.05.   PHYSICAL EXAMINATION:  VITAL SIGNS:  Blood pressure 105/73, pulse 58,  respirations 16.  HEENT:  Head normocephalic, atraumatic.  Eyes:  Pupils are equal, round,  reactive to  light and accommodation.  Mucous membranes are now pink and  moist.  Tongue midline.  NECK:  Supple.  There is no jugular venous distention and no carotid  bruit appreciated.  CARDIOVASCULAR:  Regular rate and rhythm with 1/6 systolic murmur  auscultated.  Pulses are 2+ and equal bilaterally with no bruits  appreciated.  LUNGS:  Clear to auscultation without wheezes, rales, or rhonchi.  ABDOMEN:  Soft, nontender, with 2+ bowel sounds.  There is some mild  tenderness to palpation.  EXTREMITIES:  No clubbing, cyanosis or edema.  MUSCULOSKELETAL:  Pain is reproducible on palpation of the left chest,  radiating to the left arm, and also pain is reproducible with range of  motion of the left arm.  NEUROLOGIC:  Cranial nerves II-XII are intact.   EKG reveals normal sinus rhythm without evidence of acute ST-T wave  changes.   IMPRESSION:  1. Chest pain in a patient with known coronary artery disease.      a.     Status post coronary artery bypass grafting left internal       mammary artery to left anterior descending and radial artery to       left posterolateral branch in 2006.      b.     Status post bare metal stent to posterior descending artery.  2. Insulin-dependent diabetes mellitus.  3. Chronic pancreatitis.  4. Familial hypercholesterolemia.  5. History of erectile dysfunction.   PLAN:  The patient was seen and examined by myself and Dr. Arvilla Meres in intensive care unit after being transferred from [facility].  The chest pain has both typical and atypical features, primarily  atypical.  EKG is negative for acute ST-T wave changes.  Discussed  stress test versus catheterization.  Dr. Gala Romney favors stress  Myoview.  The patient will discuss  this with family and make a decision  concerning whether he wishes to proceed with catheterization or stress  test.  If cardiac enzymes are positive, will definitely need to be  scheduled for catheterization.  In the interim, we will discontinue  heparin, continue the nitroglycerin, cycle cardiac enzymes, check  amylase and lipase, and start the patient on Lovenox in lieu of heparin.  The patient will be followed in the ICU overnight, and further  recommendations based on hospital course, and the patient's response to  treatment.      Bettey Mare. Lyman Bishop, NP      Bevelyn Buckles. Bensimhon, MD  Electronically Signed    KML/MEDQ  D:  04/13/2007  T:  04/14/2007  Job:  [id number]   cc:   [name] Giovanni, MD

## 2011-01-05 NOTE — Assessment & Plan Note (Signed)
[facility]                            CARDIOLOGY OFFICE NOTE   NAME:[name], [name] ALEN                      MRN:          [id number]  DATE:09/29/2007                            DOB:          [birth year]    PRIMARY CARE PHYSICIAN:  Mila Homer. Sudie Bailey, M.D.   REASON FOR PRESENTATION:  A patient with coronary disease.   HISTORY OF PRESENT ILLNESS:  The patient presents for evaluation of the  above.  Since I last saw him he has had at least one episode of chest  discomfort, that was somewhat severe.  This was about one week ago.  He  took nitroglycerin for this.  It was somewhat atypical.  He did feel  uncomfortable the next day.  It is similar to previous pain he had, such  as pain with the last catheterization; when he was found to have disease  and we managed medically.  He says he has taken 3 nitroglycerin in the  last 6 months.  He has otherwise felt pretty well.  He is not describing  any chest pressure, neck or arm discomfort.  He has not been having any  palpitation, presyncope or syncope.  He has not been having any PND or  orthopnea.   PAST MEDICAL HISTORY:  1. Coronary artery disease:  (Catheterization on August 21      demonstrated left radial to PDA graft widely patent;  LIMA to the      LAD widely patent.  He had native three-vessel disease.  He had      normal left main.  The LAD had  proximal 80% stenosis and mid 30%      stenosis.  There was apical moderate plaque.  He had a diagonal      which had 60% proximal stenosis.  The circumflex had a stent in the      mid segment; this was patent.  Posterolateral was large and      occluded at the ostium; this was seen to fill via vein graft.  The      PDA was small to moderate sized and normal.  The right coronary      artery was nondominant, with mid 99% stenosis.  He was managed      medically.).  2. Diabetes mellitus.  3. Chronic pancreatitis with pseudocyst.  4. Poorly controlled  dyslipidemia, probably related to his diabetes.      (Some of this is secondary to his inability to afford medications).  5. Appendectomy.  6. Cholecystectomy.  7. Left knee surgery.  8. Repair of a deviated nasal septum.  9. Repair of tendons in the middle finger of his right hand.  10.Erectile dysfunction.   ALLERGIES AND INTOLERANCES:  CONTRAST DYE, PENICILLIN, SULFA and REGLAN.   MEDICATIONS:  1. Fenofibrate 54 mg daily.  2. Plavix 75 mg daily.  3. Enalapril 2.5 mg daily.  4. Vytorin 10/40.  5. Omega-3.  6. Prevacid 30 mg daily.  7. Lantus.  8. Metformin 1000 mg b.i.d.  9. Glipizide 10 mg b.i.d.  10.Paroxetine.  11.Domperidone.  12.__________.  13.Metoprolol 25  mg b.i.d.   REVIEW OF SYSTEMS:  As stated in the HPI, otherwise negative for other  systems.   PHYSICAL EXAMINATION:  The patient is in no distress.  Blood pressure  139/95, heart 74 and regular, weight 178 pounds, body mass index 25.  HEENT:  Eyes were unremarkable; pupils equal, round and reactive to  light.  Fundi not visualized.  Oral mucosa unremarkable.  NECK:  No jugular distention to 45 degrees.  Carotid upstroke brisk and  symmetrical.  No bruits or thyromegaly.  LYMPHATICS:  No cervical, axillary or inguinal adenopathy.  LUNGS:  Clear to auscultation bilaterally.  BACK:  No costovertebral angle tenderness.  CHEST:  Well-healed sternotomy scar.  HEART:  PMI not displaced or sustained.  S1 and S2 within normal limits;  with no S3 and no S4.  No clicks, rubs or murmurs.  ABDOMEN:  Obese, positive bowel sounds.  Normal frequency and pitch.  No  bruits, rebound, guarding or midline pulsatile mass.  No organomegaly.  SKIN:  No rash.  EXTREMITIES:  Pulses 2+ and no edema.   ASSESSMENT/PLAN:  1. CORONARY DISEASE.  He is having some atypical symptoms.  This is a      relatively stable pattern.  No further cardiovascular testing is      suggested.  He will continue with risk reduction.  2. DYSLIPIDEMIA.   He is having this followed closely by Dr. Sudie Bailey.      His triglycerides were 920 in December.  However, he was not taking      good control of his diabetes.  He was skimping on his medicines      because of cost.  Dr. Sudie Bailey is helping him with this.  Hopefully      he will get this into a reasonable level.   FOLLOW-UP:  We will see the patient back in 1 year, or sooner if needed.     Rollene Rotunda, MD, [facility]  Electronically Signed    JH/MedQ  DD: 09/29/2007  DT: 10/01/2007  Job #: [id number]   cc:   Mila Homer. Sudie Bailey, M.D.

## 2011-01-05 NOTE — Assessment & Plan Note (Signed)
[facility]                            CARDIOLOGY OFFICE NOTE   NAME:[name], [name] NANNEY                      MRN:          [id number]  DATE:05/09/2007                            DOB:          [birth year]    PRIMARY:  Dr. Sudie Bailey.   REASON FOR PRESENTATION:  Evaluate patient with chest pain and coronary  disease.   HISTORY OF PRESENT ILLNESS:  Patient was admitted on August 21 with  chest discomfort.  He subsequently had a cardiac catheterization that  demonstrated a left radial graft to a posterior lateral as widely  patent, a LIMA to an LAD was widely patent.  He has native 3 vessel  coronary disease with a normal left main, the LAD had a proximal 80%  stenosis, mid 30% stenosis.  There was apical moderate plaque.  A  diagonal had 60% proximal stenosis.  His circumflex had a stent in the  mid-segment.  It was patent.  It was a posterior lateral which was large  and occluded at the ostium.  This was seen to fill via the vein graft.  The PDA was small to moderate size and normal.  The right coronary  artery is nondominant with mid 99% stenosis.  He was managed medically.  Since that time he has done well.  He has had no recurrent chest  discomfort.  He is doing some activity.  He has had no new shortness of  breath.  He denies any PND or orthopnea.   PAST MEDICAL HISTORY:  1. Coronary artery disease as described above.  2. Diabetes mellitus.  3. Chronic pancreatitis with pseudocyst.  4. Familiar dyslipidemia.  5. Appendectomy.  6. Cholecystectomy.  7. Left knee surgery.  8. Repair of a deviated nasal septum.  9. Repair of tendons in the middle finger of the right hand.  10.Erectile dysfunction.   ALLERGIES:  CONTRAST DYE, PENICILLIN, SULFA, REGLAN.   MEDICATIONS:  1. Metoprolol 25 mg b.i.d.  2. Lipram.  3. Paroxetine 10 mg daily.  4. Glipizide 10 mg b.i.d.  5. Metformin 1000 mg b.i.d.  6. Lantus.  7. Prevacid 30 mg daily.  8. Vytorin  10/40 daily.  9. Enalapril 2.5 mg daily.  10.Plavix 75 mg daily.  11.Aspirin 81 mg daily.  12.Niacin 500 mg daily.  13.Diazepam 10 mg t.i.d.  14.Hydrocodone.   REVIEW OF SYSTEMS:  As stated in the HPI and otherwise negative for  other systems.   PHYSICAL EXAMINATION:  Patient is in no distress.  Blood pressure  127/91, heart rate 64 and regular.  HEENT:  Eyelids unremarkable, pupils equally round and reactive to  light, fundi not visualized.  NECK:  No jugular venous distention at 45 degrees, carotid upstroke  brisk and symmetrical, no bruits, no thyromegaly.  LYMPHATICS:  No adenopathy.  LUNGS:  Clear to auscultation bilaterally.  BACK:  No costovertebral angle tenderness.  CHEST:  Well-healed sternotomy scar.  HEART:  PMI not displaced or sustained, S1 and S2 within normal limits,  no S3, no S4, no clicks, no rubs, no murmurs.  ABDOMEN:  Flat, positive bowel sounds normal in  frequency and pitch, no  bruits, no rebound, no guarding, no midline pulsatile mass, no  organomegaly.  SKIN:  No rashes, no nodules.  EXTREMITIES:  With 2+ right radial pulse, absent left radial pulse with  a well-healed radial harvest site, no cyanosis, no clubbing, no edema.  NEURO:  Grossly intact.   EKG sinus rhythm, rate 60, incomplete right bundle branch block, no  acute ST-T wave changes.   ASSESSMENT/PLAN:  1. Coronary disease.  Patient is having no new symptoms consistent      with angina.  He is being managed with secondary risk reduction.  2. Hypertriglyceridemia, this is his predominant lipid abnormality.      He is due to have lipids checked again and may need an increase to      his fenofibrate.  He has been watching his diet.  This can be      followed by his primary care physician.  3. Diabetes mellitus per Dr. Sudie Bailey.  4. Followup, we will see the patient back in about 6 months or sooner      if needed.     [name] Rotunda, MD, [facility]  Electronically Signed    JH/MedQ  DD:  05/11/2007  DT: 05/11/2007  Job #: [id number]   cc:   [name]. Sudie Bailey, M.D.

## 2011-01-08 NOTE — Group Therapy Note (Signed)
[facility]  Patient:    [name], [name] Visit Number: [id number] MRN: [id number]          Service Type: MED Location: 3A A213 01 Attending Physician:  Rosalyn Charters Dictated by:   John Giovanni, M.D. Admit Date:  07/30/2001                               Progress Note  SUBJECTIVE:  Patient developed diffuse abdominal pain starting about four days prior to admission.  I had seen him in the office two days prior to that and had cut back his Duragesic patch from 50 mg q.3d. to 25 mg q.3d.  When the patient developed pain, he thought it was secondary to a lower-dose patch and within a couple of days, he increased the patch back to 50 mg q.3d., but then two days later he was still having abdominal pain, so he presented to the ER. He says the pain now is somewhat improved.  CT of the abdomen done two days ago did show multiple diverticula in the descending colon.  It was felt that he had multiple diverticula and acute diverticulitis.  His pancreas was absolutely normal.  (His biliary stent was removed three weeks ago.)  OBJECTIVE:  He is supine in bed, in no acute distress, oriented and alert. Today, his temperature is 99 degrees, pulse is 70, respiratory rate 20, blood pressure 103/58.  The heart is regular with no murmur, rate of 70.  Lungs are clear throughout.  Abdomen soft without hepatosplenomegaly or mass and although he has had diffuse tenderness, there is no severe tenderness or rebound.  Bowel sounds appear to be normal and there is no edema in the ankles.  ASSESSMENT: 1. Diverticulitis. 2. History of pancreatitis.  PLAN:  Continue current medications including IV Cipro and IV metronidazole. Decrease IV rate.  Hopefully, he will be able to be discharged home in two to three days. Dictated by:   John Giovanni, M.D. Attending Physician:  Rosalyn Charters DD:  08/01/01 TD:  08/01/01 Job: [id number]

## 2011-01-08 NOTE — Op Note (Signed)
NAMEJAHAAD, [name]               ACCOUNT NO.:  [id number]   MEDICAL RECORD NO.:  [id number]          PATIENT TYPE:  INP   LOCATION:  2870                         FACILITY:  [facility]   PHYSICIAN:  Salvatore Decent. Cornelius Moras, M.D. DATE OF BIRTH:  [birth year]   DATE OF PROCEDURE:  11/24/2004  DATE OF DISCHARGE:                                 OPERATIVE REPORT   PREOPERATIVE DIAGNOSIS:  Open sternal wound with exposed sternal wire.   POSTOPERATIVE DIAGNOSIS:  Open sternal wound with exposed sternal wire.   PROCEDURE:  Sternal wire removal, excisional debridement of sternal wound,  placement of vacuum-assisted closure device.   SURGEON:  Salvatore Decent. Cornelius Moras, M.D.   ANESTHESIA:  General.   BRIEF CLINICAL NOTE:  The patient is a 50 year old diabetic male who  underwent coronary artery bypass grafting x2 on October 20, 2004.  Over the  last two weeks prior to surgery, the patient has been followed carefully in  the office with an area of nonhealing portion of the inferior aspect of the  sternal wound.  Wound cultures have been obtained and have been sterile.  There is evidence of some fat necrosis inferiorly, and the wound is been  opened and packed.  Upon return to the office April 3, the wound appeared  very clean with healthy granulation tissue, although one could appreciate  that the inferior sternal wire is now exposed.  The patient is brought in  for planned sternal wire removal with wound debridement and subsequent  V.A.C. placement to assist with further wound healing.   OPERATIVE CONSENT:  The patient and his wife have been counseled at length  regarding the indications and potential benefits of wound debridement and  wire removal.  They understand the possibility that the sternum could become  unstable, although with exposed wire it is unlikely that the wound would  heal sufficiently without wire removal.  All of their questions have been  addressed.   OPERATIVE NOTE IN DETAIL:  The  patient is brought to the operating room on  the above-mentioned date and placed in the supine position on the operating  table.  Intravenous antibiotics are administered.  General endotracheal  anesthesia is induced uneventfully under the care and direction of Dr. Sheldon Silvan.  The patient's anterior chest is prepared with Betadine solution  after removal of the small dressing from the patient's existing open wound.   The wound is carefully inspected.  The exposed sternal wire is removed.  The  soft tissues around this area are debrided with a 15 blade knife. A second  sternal wire that is immediately adjacent is encountered, and this is  subsequently removed and both wires are sent to pathology for identification  purposes.  Further sharp debridement of a slight amount of exudate and  devitalized tissue is performed, although the wound itself looks very clean  with essentially healthy-appearing granulation tissue surrounding.  There is  no sign of sternal instability.  The wound is irrigated with copious saline  solution.  A vacuum-assisted closure device is placed after trimming a small size  sponge to an appropriate  size.  The V.A.C. device is hooked up to continuous  suction.   A Foley catheter is placed and removed while the patient remains under  general anesthesia to enter the patient's bladder.  The patient is extubated  in the operating room and transported to the recovery room in stable  condition.  There are no intraoperative complications.      CHO/MEDQ  D:  11/24/2004  T:  11/24/2004  Job:  [id number]

## 2011-01-08 NOTE — Discharge Summary (Signed)
[name], [name]               ACCOUNT NO.:  [id number]   MEDICAL RECORD NO.:  [id number]          PATIENT TYPE:  INP   LOCATION:  2915                         FACILITY:  [facility]   PHYSICIAN:  Rollene Rotunda, MD, FACCDATE OF BIRTH:  [birth year]   DATE OF ADMISSION:  05/30/2006  DATE OF DISCHARGE:  06/01/2006                                 DISCHARGE SUMMARY   PRIMARY CARDIOLOGIST:  Rollene Rotunda, M.D.   PRIMARY CARE PHYSICIAN:  Dr. Sudie Bailey in [place].   PRINCIPAL DIAGNOSIS:  Unstable angina.   SECONDARY DIAGNOSES:  1. Coronary artery disease.  2. Hyperlipidemia.  3. Type 2 diabetes mellitus.  4. Chronic pancreatitis.  5. Severe hypertriglyceridemia.  6. Pancreatic stenting in 2005.  7. Status post appendectomy.  8. Status post cholecystectomy.  9. History of deviated septum status post repair.  10.Status post right hand surgical procedure.   ALLERGIES:  IV CONTRAST, PENICILLIN, SULFA, REGLAN AND INTOLERANCE TO  STATINS.   PROCEDURES:  Left heart catheterization with successful PCI and stenting of  the distal left circumflex with placement of a 2.0 x 12 mm Multilink  MiniVision bare metal stent.   HISTORY OF PRESENT ILLNESS:  A 50 year old married white male with prior  history of CAD status post CABG x2 in February of 2006 who was in his usual  state of health until several weeks ago when he began to experience an  uneasy feeling in his chest.  He recently had a chest CT performed by Dr.  Tressie Stalker which showed no significant abnormalities.  He had recurrent  discomfort in his chest the morning of October 8 prompting him to present to  [facility] where an EKG showed sinus rhythm without any acute ST or  T abnormalities.  Cardiac markers were negative x1 and his discomfort was  relieved with sublingual nitroglycerin.  The decision was made to transfer  him to [facility] for further evaluation.   HOSPITAL COURSE:  [name] ruled out for MI by cardiac  markers.  On October  9, he was taken to the cardiac cath lab where he underwent left heart  cardiac catheterization revealing patency of the previously placed LIMA to  the first diagonal as well as the free radial artery to the left LAD.  He  was noted to have an 80% stenosis in the distal circumflex as well as an  occluded nondominant right coronary artery which was old.  Films were  reviewed by Dr. Samule Ohm and he underwent successful PCI and stenting of the  distal circumflex with placement of a 2.0 x 12 mm Multilink MiniVision bare  metal stent.  Of note, his EF was 60% without regional wall motion  abnormalities on left ventriculography.  Postprocedure, [name] was  monitored in the stepdown area where he has not had any recurrent chest  pain.  He has been maintained on aspirin and Plavix therapy as well as his  home medications and is being discharged home today in satisfactory  condition.   DISCHARGE LABS:  Hemoglobin 15.8, hematocrit 46.5, WBC 8.8, platelets 152,  sodium 135, potassium 4.3,  chloride 102, CO2 29, BUN 15, creatinine 1.0,  glucose 271, total bilirubin 1.1, alkaline phosphatase 50, AST 17, ALT 13,  total protein 5.4, albumin 3.0, calcium 8.6, CK 69, MB 1.0, troponin I 0.02.   DISPOSITION:  The patient is being discharged home today in good condition.   FOLLOWUP PLANS AND APPOINTMENTS:  He has a followup appointment with Dr.  Rollene Rotunda at our [facility] office on October 30 at 10:45 a.m.  He is  asked to follow up with his primary care physician, Dr. Sudie Bailey, in  approximately three to four weeks.   DISCHARGE MEDICATIONS:  1. Aspirin 325 mg q.d.  2. Plavix 75 mg q.d. x30 days.  3. Paxil 10 mg q.d.  4. Glipizide 10 mg b.i.d.  5. Enalapril 2.5 mg q.d.  6. Metformin 500 mg b.i.d. to be resumed June 02, 2006.  7. Vytorin 10/40 mg q.h.s.  8. Lipram three capsules t.i.d. with meals.  9. Metoprolol 50 mg b.i.d.  10.Lantus 35 units q.h.s.  11.Prevacid 30 mg  q.d.  12.Nitroglycerin 0.4 mg sublingual p.r.n. chest pain.   OUTSTANDING LABS AND STUDIES:  None.   DURATION OF DISCHARGE ENCOUNTER:  Fifty minutes including physician time.     ______________________________  Nicolasa Ducking, ANP    ______________________________  Rollene Rotunda, MD, [facility]    CB/MEDQ  D:  06/01/2006  T:  06/01/2006  Job:  [id number]   cc:   Dr. Sudie Bailey

## 2011-01-08 NOTE — Group Therapy Note (Signed)
[facility]  Patient:    [name], [name] Visit Number: [id number] MRN: [id number]          Service Type: MED Location: 3A Y782 01 Attending Physician:  Rosalyn Charters Dictated by:   John Giovanni, M.D. Admit Date:  07/30/2001                               Progress Note  SUBJECTIVE:  Patient is generally getting better.  He really started getting sick about a week ago and was admitted to the hospital three days ago.  Since then, he has gradually improved.  He still has some lower abdominal tenderness.  OBJECTIVE:  Temperature 97 degrees, pulse 86, respiratory rate 20, blood pressure 110/60.  He is semirecumbent in bed, in no acute distress, oriented and alert, well-developed, well-nourished.  His abdomen is soft without hepatosplenomegaly or mass but there is some mild tenderness, particularly in the left lower quadrant.  His skin turgor is normal.  Mucous membranes are moist.  ASSESSMENT:  Diverticulitis, much improved.  PLAN:  Since he is now urinating well, we will switch to a Heplock.  He has been getting injections of narcotic q.2h. and will discontinue this and put him back on his Duragesic 50 patch q.3d. and use Tylox for breakthrough pain. Hopefully, we will be able to discharge him in a couple of days.  DISCUSSION:  At his request, I am checking an ANA, given his family history of lupus (sister has this).Dictated by:   John Giovanni, M.D. Attending Physician:  Rosalyn Charters DD:  08/02/01 TD:  08/03/01 Job: [id number]

## 2011-01-08 NOTE — Discharge Summary (Signed)
[facility]  Patient:    [name], [name] Visit Number: [id number] MRN: [id number]          Service Type: MED Location: 3A Y782 01 Attending Physician:  Rosalyn Charters Dictated by:   John Giovanni, M.D. Admit Date:  07/30/2001 Disc. Date: 08/05/01                             Discharge Summary  This 50 year old man was admitted to the hospital with abdominal pain which was found to be secondary to diverticulitis.  He had a benign seven day hospitalization extending from July 30, 2001 through August 05, 2001.  Given his history of pancreatitis it was initially felt that he had recurrent pancreatitis.  His acute abdominal x-ray and PA chest were essentially normal except for chronic bronchitis changes in the lung.  The CT scan of the chest showed a normal noncontrast CT appearance of the pancreas with no evidence of pancreatitis, but changes along the descending colon felt to be representing an acute diverticulitis.  CT of the pelvis, again, showed diverticulitis findings.  Admission white cell count 3900, 55 neutrophils, 34 lymphs.  Recheck on August 03 2999 with 48 neutrophils and 38 lymphs.  MET-7 was acceptable. Lipase 18, amylase 56.  UA was negative.  Liver tests were normal.  Treatment consisted of IV normal saline 125 cc/hour, Dilaudid 2 mg IV q.2h. p.r.n. severe pain.  He is continued on Tricor 160 mg q.d., Paxil 20 mg q.d., Protonix 40 mg q.d., Pancrease three capsules with meals and one with snacks, Zocor 10 mg q.d., Xanax 0.5 mg q.i.d. p.r.n.  Accu-Cheks a.c. and h.s.  He was also given Reglan 10 mg IV q.6h. and when the diverticulitis was diagnosed he was started on Cipro 400 mg IV q.12h, Flagyl 500 mg IV q.8h.  By the third day his IV fluids were decreased to 50 ml/hour.  Also, since he was concerned about lupus in a family member we checked an ANA which was negative.  Sedimentation rate was 1.  Repeat MET-7 was normal.  By the  fourth day his I was decreased to 30 cc/hour and then discontinued and switched to Heplock.  After four days of Dilaudid this was discontinued.  He was put on Tylox q.4h. p.r.n. severe pain and his Duragesic 50 patch started back up again q.3 days.  That evening he became quite agitated and his Dilaudid had to be restarted at 2 mg IV just over the night.  Did well by the following morning, however, and seemed to be doing fine with his patch and Tylox and we were able to continue him off the Dilaudid at that point.  I also switched him from alprazolam 0.5 to diazepam 10 mg t.i.d. which is the benzodiazepine he usually uses at home. He was put on a low fat diet which he tolerated quite well the morning of discharge.  DISCHARGE DIAGNOSES: 1. Acute diverticulitis. 2. History of pancreatitis currently with a normal CT of the pancreas. 3. Diabetes related to the pancreatitis, but with stable blood sugars on his    a.c. and h.s. Accu-Cheks. 4. Hyperlipidemia. 5. Depression.  DISCHARGE MEDICATIONS: 1. Cipro 500 mg b.i.d. for seven day (14, no refills). 2. Flagyl 500 mg t.i.d. (20 with no refills). 3. Duragesic patch at home two days from tomorrow and then three days after    that will go from 50 patch to 25 patch as already arranged  outpatient    before he was admitted. 4. Go back on Pancrease three capsules with meals and one with snacks. 5. Tylox q.4h. 6. Zocor 10 mg q.d. 7. Tricor 160 mg q.d. 8. Protonix 40 mg q.d. 9. He has done well without Avandia in the hospital, see if he can do okay    without this at home.  Follow up in my office in about a week. Dictated by:   John Giovanni, M.D. Attending Physician:  Rosalyn Charters DD:  08/05/01 TD:  08/05/01 Job: [id number]

## 2011-01-08 NOTE — Procedures (Signed)
[name], [name] NO.:  [id number]   MEDICAL RECORD NO.:  [id number]           PATIENT TYPE:   LOCATION:  2027                          FACILITY:  [facility]   PHYSICIAN:  Edward L. Juanetta Gosling, M.D.DATE OF BIRTH:  [birth year]   DATE OF PROCEDURE:  10/15/2004  DATE OF DISCHARGE:                                EKG INTERPRETATION   TIME SEEN:  1453   EKG INTERPRETATION:  The rhythm is sinus rhythm with sinus arrythmia.  There  is an incomplete right bundle branch block.  There is a suggestion of a  chronic pulmonary disease pattern.  Q-waves are seen inferiorly and there  was T-wave inversion inferiorly which could indicate something acute but  there were no ST elevations definitely suggesting acute inferior MI.  Clinical correlation is suggested.  T-wave abnormalities were seen  anteriorly and laterally  Abnormal electrocardiogram.      ELH/MEDQ  D:  10/16/2004  T:  10/16/2004  Job:  [id number]

## 2011-01-08 NOTE — H&P (Signed)
[facility]  Patient:    [name], [name] Visit Number: [id number] MRN: [id number]          Service Type: MED Location: 3A [phone] Attending Physician:  Rosalyn Charters Dictated by:   Kari Baars, M.D. Admit Date:  07/30/2001                           History and Physical  REASON FOR ADMISSION:  Recurrent pancreatitis.  HISTORY OF PRESENT ILLNESS:  This is a 50 year old who has had a history of pancreatitis on multiple occasions.  He has had two hospitalizations fairly recently for this, and he has been being followed at [facility] by one of the gastroenterologist.  He recently had a stent removed and his wife says he was told that if had some fluid building up around his pancreas and if this did not clear that he would need to have a partial pancreatectomy.  He said that his pain is exactly what he has had in the past when he has had pancreatitis, and it has got worse over the last three to four days.  He says that he has not been able to eat.  His abdominal pain is 8/10.  He has received Dilaudid and says he is more comfortable.  He has not had any dysphagia.  He also was found to have an ulcer when he was at [facility] recently, and he appears to have had some exacerbation of his abdominal pain, perhaps related to his ulcer. The etiology of his pancreatitis appears to be hyperlipidemia.  MEDICATIONS:  He is on a large number of medications including:  1. Avandia 8 mg daily.  2. Amaryl that he takes on a p.r.n. basis.  3. Tricor 160 mg daily.  4. Zocor 10 mg daily.  5. Paxil 25 mg daily.  6. Fish oil tablets.  7. Tylox on a p.r.n. basis.  8. Duragesic patch (I do not know the size of that).  9. Prevacid 30 mg daily. 10. Pancrease three with meals and one with snacks.  PAST MEDICAL HISTORY:  In addition to his other problems he has diabetes which is related to his pancreatitis obviously.  Otherwise, he has a gastric ulcer, history of pancreatitis,  chronic prostate complaints.  ALLERGIES:  He is allergic to PENICILLIN, SULFA and IV DYES.  PAST SURGICAL HISTORY:  He has had an arthroscopy of the knee, a cholecystectomy, appendectomy, deviated septum repair, repair of the tendons in his middle fingers.  FAMILY HISTORY:  His mother died of a myocardial infarction.  There is a very strong positive family history of hyperlipidemia.  His father died from complications of gangrene in the leg, and his brother has also had problems with pancreatitis.  SOCIAL HISTORY:  He is married.  He does not use alcohol or tobacco.  He has not been able to work in the last 10 months or so because of problems with his abdomen.  PHYSICAL EXAMINATION:  GENERAL:  He appears to be very anxious.  Temperature 97, pulse 87, respirations 16, blood pressure 132/90.  HEENT:  His pupils are equal, round and reactive to light and accommodation. Mucous membranes are slightly dry.  NECK:  His neck is supple without masses, bruits or jugular venous distention.  CHEST: His chest is fairly clear without wheezes, rales or rhonchi.  HEART:  Regular without murmurs, gallops or rubs.  ABDOMEN:  His abdomen is soft without masses.  His abdomen is  indeed fairly soft right now.  He says it is much better since he has had his Dilaudid.  EXTREMITIES:  No edema.  LABORATORY DATA:  Lab work shows that his amylase and lipase are both normal. His metabolic-7 is normal.  Albumin normal.  Liver enzymes are normal.  His white blood count is 3900, hemoglobin 15.5.  ASSESSMENT:  He has abdominal pain but no definite evidence of pancreatitis. However, he certainly has a history of pancreatitis and very well may have problems with that again, and we may simply have not seen a rise in his enzyme levels as yet.  He is clearly having a lot of pain.  He is also very anxious which, of course, may be making his problems worse.  His diabetes is fairly well-controlled at this  point.  He does have a history of an ulcer, and that will need to be treated as well.  He sees a gastroenterologist at [facility] who had recently removed a stent and he actually discussed his situation with a gastroenterologist at [facility] today and was told that he should come here because there were no beds available at [facility] at this time.  PLAN:  The plan then is to get him into the hospital, give him pain medications as needed, put him on Protonix for his presumed ulcer, continue with treatments for his hyperlipidemia, continue with his Paxil for his depression.  Add Xanax as needed and repeat labs, etc., in the morning to see if his amylase and Pancrease have gone up at that time.  He may need to be transferred to [facility] for further evaluation by his gastroenterologist. Dictated by:   Kari Baars, M.D. Attending Physician:  Rosalyn Charters DD:  07/30/01 TD:  07/30/01 Job: [id number]

## 2011-01-08 NOTE — Cardiovascular Report (Signed)
NAMEVALERY, CHANCE NO.:  [id number]   MEDICAL RECORD NO.:  [id number]          PATIENT TYPE:  INP   LOCATION:                               FACILITY:  [facility]   PHYSICIAN:  Rollene Rotunda, M.D.   DATE OF BIRTH:  1960-11-01   DATE OF PROCEDURE:  DATE OF DISCHARGE:                              CARDIAC CATHETERIZATION   PRIMARY CARE PHYSICIAN:  Dr. Katharine Look.   PROCEDURE:  Left heart catheterization/coronary arteriography.   INDICATION:  Evaluate patient with unstable angina.   PROCEDURE NOTE:  Left heart catheterization was performed via the right  femoral artery.  The artery was cannulated using anterior wall puncture.  A  #6 French arterial sheath was inserted via the modified Seldinger technique.  Preformed Judkins and a pigtail catheter were utilized.  The patient  tolerated the procedure well and left the lab in stable condition.   RESULTS:  Hemodynamics:  LV 114/26, AO 115/81.  Coronaries of the left main  was normal.   The LAD was a large vessel wrapping the apex.  There was long proximal 80%  stenosis before a mid diagonal.  Following this there was a mid 30% stenosis  and diffuse luminal irregularities.  The first diagonal was a moderate-sized  vessel with proximal 60% stenosis.  The second diagonal was a large vessel  and was normal.   The circumflex in the AV groove had diffuse luminal irregularities.  There  was long 40% stenosis before posterolateral and PDA in the distal  circumflex.  The first two obtuse marginals were very small normal.  The  next two obtuse marginals were moderate-sized and normal.  A fifth obtuse  marginal was a very large vessel with long proximal and mid 95% stenosis and  TIMI-2 flow.  There was an 80% focal lesion at the takeoff of the moderate-  sized PDA.   The right coronary artery was a nondominant vessel.  It was occluded  proximally with scant bridging collaterals.   Left ventriculogram -- the left  ventriculogram was obtained in the RAO  projection.  The EF was 60% with mild inferior wall hypokinesis.   CONCLUSION:  Three-vessel coronary artery disease.  The lesions are long.  I  think this would best managed with coronary artery bypass grafting.  He will  also need management of his severe hypertriglyceridemia and diabetes.  We  will consult CVTS and discuss possible endocrinology consult with his  primary care physician.   PLAN:  I reviewed the films with Dr. Riley Kill.  The patient may best be  served with a left internal mammary artery to the left anterior descending  artery.       ___________________________________________  Rollene Rotunda, M.D.    JH/MEDQ  D:  10/19/2004  T:  10/19/2004  Job:  [id number]   cc:   Mila Homer. Sudie Bailey, M.D.  [address] [place], [place] [address]  Fax: [phone]

## 2011-01-08 NOTE — Cardiovascular Report (Signed)
NAMEJAKOBY, MELENDREZ NO.:  [id number]   MEDICAL RECORD NO.:  [id number]           PATIENT TYPE:   LOCATION:                                 FACILITY:   PHYSICIAN:  Salvadore Farber, MD  DATE OF BIRTH:  [birth year]   DATE OF PROCEDURE:  05/31/2006  DATE OF DISCHARGE:                              CARDIAC CATHETERIZATION   PROCEDURE:  1. Left heart catheterization.  2. Left ventriculography.  3. Coronary angiography.  4. Bypass graft angiography.  5. Bare metal stenting of the distal circumflex.  6. Star close closure of the right common femoral arteriotomy site.   INDICATIONS:  Mr. Latour is a 50 year old gentleman, status post coronary  artery bypass grafting surgery in February 2006, using a LIMA to the LAD and  a free radial graft to the left posterior left ventricular branch.  He has  been troubled by chronic chest pain since then which he describes as a  bruised feeling.  He now presents with sharp shooting pains radiating from  his chest to his left arm which is distinct from his chronic pain.  He was  admitted to hospital.  Troponin was minimally elevated 0.06.  He was  referred for diagnostic angiography and possible percutaneous coronary  intervention.   PROCEDURAL TECHNIQUE:  Informed consent was obtained.  Under 1% lidocaine  local anesthesia, a 5-French sheath was placed in the right common femoral  artery, using the modified Seldinger technique.  Coronary angiography of the  native vessels was performed using JL-4 and JR-4 catheters.  A JR-4 catheter  was used for the free radial graft and LIMA catheter for the left internal  mammary artery graft.  Left heart catheterization and ventriculography was  then performed using a pigtail catheter.  Images demonstrated an 80-85%  stenosis of the distal circumflex.  Decision was made to proceed to  percutaneous revascularization of this lesion.   Anticoagulation was initiated with bivalirudin.  ACT was  confirmed to be  greater than 225 seconds.  Alla Feeling Study drugs were administered per  protocol.  Sheath was up-sized over a wire to 6-French.  A 6-French Voda  left guide was advanced over wire engaging the ostium of the left main.  Prowater wire was advanced to the distal circumflex without difficulty.  The  lesion was pre-dilated using a 2 x 9-mm Maverick at 6 atmospheres.  He was  then stented using a 2 x 12-mm mini Vision at 14 atmospheres.  The stent was  then post-dilated using a 2.25 x 12-mm Quantum at 16 atmospheres.  Final  angiography demonstrated no residual stenosis, no dissection, and TIMI III  flow to the distal vasculature.   The arteriotomy was then closed using a Star close device.  Complete  hemostasis was obtained.  He was then transferred to the holding room in  stable condition having tolerated the procedure well.   COMPLICATIONS:  None.   FINDINGS:  1. LV:  136/6/10.  2. EF 60% without regional wall motion abnormality.  3. No aortic stenosis or mitral regurgitation.  4. Left subclavian artery, angiographically normal.  5. Left main, angiographically normal.  6. LAD:  Moderate-sized vessel with a long segment of 80% stenosis      proximally.  The LIMA graft does not in fact touchdown on the left      anterior descending artery.  Instead, it touches down a moderate-sized      diagonal which in turn fills the LAD.  Filling of the LAD is excellent.  7. Ramus intermedius:  Moderate-sized vessel with 50% proximal stenosis      and a 1.5-mm vessel.  8. Circumflex:  Fairly large dominant vessel.  There is a 40% stenosis      proximally.  There was then an 80% stenosis distally before the origin      of the left PDA.  This 80% stenosis was stented to no residual using a      bare metal stent.  There is a posterior left ventricular branch which      is totally occluded but is well supplied via the free radial graft      which is widely patent.  9. Right coronary:   Relatively small nondominant vessel.  It is totally      occluded proximally and has bridging collaterals.   IMPRESSION:  1. Patent left internal mammary artery to diagonal and free radial to      posterior left ventricular branch grafts.  2. Successful stenting of the native circumflex.   RECOMMENDATIONS:  The patient should be maintained on Plavix for 30 days and  aspirin indefinitely.      Salvadore Farber, MD  Electronically Signed     WED/MEDQ  D:  05/31/2006  T:  06/01/2006  Job:  [id number]   cc:   Mila Homer. Sudie Bailey, M.D.  Rollene Rotunda, MD, [facility]

## 2011-01-08 NOTE — Group Therapy Note (Signed)
[facility]  Patient:    [name], [name] Visit Number: [id number] MRN: [id number]          Service Type: MED Location: [id number] Attending Physician:  Rosalyn Charters Dictated by:   Kari Baars, M.D. Admit Date:  07/30/2001                               Progress Note  PRIMARY CARE PHYSICIAN:  Dr. John Giovanni.  PROBLEM:  Abdominal pain.  SUBJECTIVE:  [name] says he is feeling a little bit better this morning. He has had no new problems.  He has no fever or chills, no cough, no sputum production.  His abdomen is about the same as it has been.  His amylase and lipase are normal again, however, CT scan shows that his pancreas looks normal on the CT but his sigmoid colon has changes suggestive that he has sigmoid diverticulitis.  I think this would correspond with the clinical picture with left-sided pain.  He now says that his pain may not be exactly what he has had in the past with pancreatitis.  There is no inflammation in the pancreas and no evidence of pseudocyst.  OBJECTIVE:  His exam shows that he is still minimally tender.  He is afebrile with chest clear.  His heart is regular.  His abdomen is softer.  ASSESSMENT:  He has diverticulitis, not pancreatitis.  PLAN:  Plan is to have him start Flagyl 500 mg IV q.8h. and Cipro 400 mg IV q.12h.  I had told him that I could put him on Levaquin but he says that the last few times that he has taken Levaquin, it has caused him to have a lot of problems.  He says he thinks he tolerates Cipro. Dictated by:   Kari Baars, M.D. Attending Physician:  Rosalyn Charters DD:  07/31/01 TD:  08/01/01 Job: [id number]

## 2011-01-08 NOTE — Discharge Summary (Signed)
NAMEKAYSHAWN, OZBURN               ACCOUNT NO.:  [id number]   MEDICAL RECORD NO.:  [id number]          PATIENT TYPE:  INP   LOCATION:  2002                         FACILITY:  [facility]   PHYSICIAN:  Salvatore Decent. Cornelius Moras, M.D. DATE OF BIRTH:  [birth year]   DATE OF ADMISSION:  10/15/2004  DATE OF DISCHARGE:                                 DISCHARGE SUMMARY   PRIMARY DIAGNOSIS:  Coronary artery disease.   SECONDARY DIAGNOSES:  1.  Chronic pancreatitis initially discovered in 2002.  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  Abdominal      computed tomographic scan performed last week at [facility]      documented stable radiographic appearance of two small pseudocysts with      no other acute changes.  2.  Familial hyperlipidemia.  3.  Diabetes mellitus type 2.   OPERATIONS AND PROCEDURES:  1.  Left heart catheterization and coronary arteriography.  2.  Coronary artery bypass grafting x2 with a left internal mammary artery      to the distal left anterior descending coronary artery and left radial      artery to the left distal lateral branch.  Note this was an off pump      coronary artery bypass grafting.   ALLERGIES:  The patient states he is allergic to IV CONTRAST, PENICILLIN,  SULFA and REGLAN.   HISTORY OF PRESENT ILLNESS:  Mr. Witucki is a 50 year old white male with no  previous history of coronary artery disease.  Past medical history notable  for familial hyperlipidemia, pancreatitis secondary to hyperlipidemia, and  adult onset of type 2 diabetes mellitus with a strong family history of  coronary artery disease.  Mr. Monk reports that over the last 2 or 3 years  he has had occasional episodes of mild chest pain.  For evaluation of this,  he apparently underwent a stress Cardiolite exam in 2004 which was thought  to be low risk for ongoing ischemia.  Apparently 2 or  3 months ago, the  patient started developing more frequent episodes of chest pain.  Initially  these symptoms were attributed to possible symptoms of reflux or ulcer  disease.  However, he began to note worsening symptoms of burning and severe  substernal chest pain that was brought on by physical activity and relieved  by sublingual nitroglycerine.  Since these symptoms progressed, he  ultimately returned to see Dr. Sudie Bailey for further evaluation.  Last week  he underwent chest CT scan to follow up his known history of pancreatitis.  Shortly after completing this examination, he remained in the radiology  department in [facility].  He developed severe substernal chest  pain that persisted and radiated down both arms that was associated with  some radiation to his neck as well as nausea and shortness of breath.  His  pain ultimately was relieved following the administration of three  sublingual nitroglycerine tablets and IV nitroglycerin drip.  He was  promptly admitted  to the hospital and transferred to [facility] for further management.  The patient had subsequently ruled out for  acute myocardial infarction by serial cardiac enzymes.  Mr. Carriere was taken  for elective cardiac catheterization by Dr. Antoine Poche on October 19, 2004.  Findings at the time of catheterization included three vessel coronary  artery disease with normal left ventricular function.  We were then  consulted for evaluation of coronary artery bypass grafting.   For details of the patient's past medical history and physical exam, please  see dictated history and physical.   HOSPITAL COURSE:  On October 20, 2004, Mr. Oelkers underwent coronary artery  bypass grafting x2.  This was done off pump.  He had a left anterior mammary  artery to distal left anterior descending artery and the left radial artery  to the left posterolateral artery.  The patient tolerated this procedure  well and was  transferred up to the intensive care unit in stable condition.  His postoperative course was pretty much unremarkable.  The patient was  extubated the following evening.  His chest tubes were discontinued on  postoperative day #1.  The patient was out of bed and ambulating well on  postoperative day #1.  He was transferred over to telemetry floor on  postoperative day #2 in stable condition.  During the patient's hospital  stay his blood glucose levels were elevated in the 200s.  Due to the  elevation, the patient was placed on Lantus Insulin.  At discharge, we did  restart him back on his Glipizide and Avandia, but did not discharge him  home on any insulin.  He was told to follow up with Dr. Sudie Bailey, who is his  family physician, for further management and possible consult for an  endocrinologist.  It was also noted postoperatively that the patient does  have hyperlipidemia for which he was tried on Statin medication prior to  undergoing surgery and he did not tolerate it secondary to muscle aches.  He  has been on gemfibrozil since which has not successfully managed his  hyperlipidemia.  We will discharge him home on his gemfibrozil, but, again,  he will follow up with Dr. Sudie Bailey to discuss further management and  possible change in medication for his hyperlipidemia.  Prior to discharge,  the patient was ambulating well, appetite good and was on room air with O2  saturations about 92%.  Prior to discharge, the patient's incisions were dry  and intact and healing well.  Mr. Seeley was discharged to home on  postoperative day #1 in stable condition.   FOLLOWUP:  A followup appointment was made with Dr. Cornelius Moras for November 16, 2004  at 1:15 p.m.  The patient will followup with Dr. Antoine Poche in about 2 weeks  for which an appointment will be schedule.  At this appointment he will  receive a PA and lateral chest x-ray and he will bring that chest x-ray with him to his appointment with Dr. Cornelius Moras.   The patient will also need to call  and make a followup appointment with Dr. Sudie Bailey for further discussion of  treatment for hyperlipidemia and his diabetes.   Mr. Harner received instructions on diet, activity level and incisional care.  He was told he was restricted to drive till released to do so.  No heavy  lifting greater than 10 pounds.  The patient was also told he is to shower  using soap and water.  He is to contact the  office if he develops any  drainage or bleeding from any of his incisions.  The patient acknowledges  understanding.   DISCHARGE MEDICATIONS:  1.  Aspirin 325 mg p.o. daily.  2.  Lopressor 25 mg p.o. b.i.d.  3.  Vasotec 2.5 mg p.o. daily.  4.  Gemfibrozil 600 mg p.o. b.i.d.  5.  Pancreatic medications as taken at home.  6.  Prevacid 30 mg p.o. daily.  7.  Avandia 8 mg p.o. daily.  8.  Lasix 40 mg p.o. daily x14 days.  9.  Potassium chloride 20 mEq p.o. daily x14 days.  10. Glipizide 10 mg p.o. b.i.d.  11. Paxil 10 mg p.o. daily.  12. Tylox 1 tablet p.o. q.4h. p.r.n. pain.  13. __________ 1 tablet twice daily.  14. Valium as needed.      KMD/MEDQ  D:  10/23/2004  T:  10/24/2004  Job:  [id number]

## 2011-01-08 NOTE — Assessment & Plan Note (Signed)
HEALTHCARE                            CARDIOLOGY OFFICE NOTE   NAME:[name], [name]                      MRN:          [id number]  DATE:10/20/2006                            DOB:          [birth year]    PRIMARY:  Mila Homer. Sudie Bailey, M.D.   REASON FOR PRESENTATION:  Evaluate patient with dyslipidemia and  coronary disease.   HISTORY OF PRESENT ILLNESS:  The patient presents for followup.  He is  50 years old.  He has coronary disease as described below.  We saw him  today here and he was seen extensively (greater than 45 minutes) by our  pharmacist in our lipid clinic to discuss his severe  hypertriglyceridemia.  Currently, it is not clear what the most recent  values are and he did not bring his medications to let me know what  formulation of over-the-counter Niaspan he is currently taking.   He did have some chest discomfort recently but this came from lifting  wood.  He has not had any substernal chest pressure, neck or arm  discomfort.  He has not had any palpitations, presyncope or syncope.  There has been no PND or orthopnea.   PAST MEDICAL HISTORY:  1. Coronary artery disease.  (Catheterization May 30, 2006, with an      LAD moderate sized with 80% proximal stenosis, LIMA to the diagonal      widely patent back-filling the LAD, ramus intermediate 50%      stenosis, circumflex 40% stenosis, 80% stenosis before the PDA, 80%      stenosis treated with a bare-metal stent.  The PDA was totally      occluded but filled via a free radial graft which was widely      patent.  The relatively small nondominant right coronary artery is      totally occluded with bridging collaterals.  EF 60%.)  2. Diabetes mellitus.  3. Chronic pancreatitis with pseudocyst.  4. Familial dyslipidemia.  5. Appendectomy.  6. Cholecystectomy.  7. Left knee surgery.  8. Repair of deviated nasal septum.  9. Repair of tendons in the middle finger of the right  hand.  10.Erectile dysfunction.   ALLERGIES:  1. INTRAVENOUS CONTRAST DYE.  2. PENICILLIN.  3. SULFA.  4. REGLAN.   CURRENT MEDICATIONS:  1. Metoprolol 25 mg b.i.d.  2. Lipram.  3. Domperidone 10 mg b.i.d.  4. Paroxetine 10 mg daily.  5. Glipizide 10 mg b.i.d.  6. Metformin 1000 mg b.i.d.  7. Lantus.  8. Prevacid 30 mg daily.  9. Omega 3 fatty acid.  10.Vytorin 10/40 daily.  11.Enalapril 5 mg daily.  12.Plavix 75 mg daily.  13.Aspirin 81 mg two tablets daily.  14.Niacin 500 mg daily.   REVIEW OF SYSTEMS:  As stated in the HPI and otherwise negative for  other systems.   PHYSICAL EXAMINATION:  GENERAL:  The patient is in no distress.  VITAL SIGNS:  Blood pressure 140/98, heart rate 68 and regular, weight  181 pounds, body mass index 26.  HEENT:  Eyelids unremarkable.  Pupils equal, round, and react to light.  Fundi not visualized.  Oral mucosa unremarkable.  NECK:  No jugular venous distention at 45 degrees.  Carotid upstroke  brisk and symmetric.  No bruits, no thyromegaly.  LYMPHATICS:  No cervical, axillary or inguinal adenopathy.  LUNGS:  Clear to auscultation bilaterally.  BACK:  No costovertebral angle tenderness.  CHEST:  Well-healed sternotomy scar.  HEART:  PMI not displaced or sustained.  S1 and S2 within normal limits.  No S3, no S4.  No clicks, no rubs, no murmurs.  ABDOMEN:  Flat, positive bowel sounds normal in frequency and pitch.  No  bruits, no rebound, no guarding.  No midline pulsatile mass.  No  hepatomegaly, no splenomegaly.  SKIN:  No rashes, no nodules.  EXTREMITIES:  Show 2+ right radial, absent left radial with a well-  healed radial harvest site.  No edema, no cyanosis, 2+ distal lower  extremity pulses.   EKG:  Sinus rhythm, rate 68, axis rightward, incomplete right bundle-  branch block, no acute ST-T wave changes.   ASSESSMENT AND PLAN:  1. Coronary disease.  The patient is having no ongoing symptoms.  No      further cardiovascular  testing is suggested.  He will continue with      aggressive risk reduction.  2. Dyslipidemia.  We saw him and talked to him at great length in our      lipid clinic.  We did not have most recent labs and need to know      the formulation of his niacin before proceeding with suggestions.      However, he has severe dyslipidemia and will need aggressive      combination therapy.  He is going to bring his medicines and come      back in about 2 weeks.  He has discussed this with Dr. Sudie Bailey who      agrees with the lipid clinic evaluation.  3. Hypertension.  Blood pressure is not at target.  However, it has      been at every other visit.  I will not make medication changes at      this point but will follow this.  His target is 120/70 with his      diabetes.  4. Followup.  I will see him back in 6 months but he will be coming      back to the lipid clinic in a couple of weeks.     Rollene Rotunda, MD, [facility]  Electronically Signed    JH/MedQ  DD: 10/20/2006  DT: 10/20/2006  Job #: [id number]   cc:   Mila Homer. Sudie Bailey, M.D.

## 2011-01-08 NOTE — Consult Note (Signed)
[name], [name]               ACCOUNT NO.:  [id number]   MEDICAL RECORD NO.:  [id number]          PATIENT TYPE:  INP   LOCATION:  2302                         FACILITY:  [facility]   PHYSICIAN:  Salvatore Decent. Cornelius Moras, M.D. DATE OF BIRTH:  1961-04-13   DATE OF CONSULTATION:  10/19/2004  DATE OF DISCHARGE:                                   CONSULTATION   REQUESTING PHYSICIAN:  Rollene Rotunda, M.D.   PRIMARY CARE PHYSICIAN:  Mila Homer. Sudie Bailey, M.D.   REASON FOR CONSULTATION:  Severe three-vessel coronary artery disease with  class IV unstable angina.   HISTORY OF PRESENT ILLNESS:  [name] is a 50 year old white male with no  previous history of coronary artery disease but past medical history notable  for history of familial hyperlipidemia, chronic pancreatitis secondary to  hyperlipidemia, and adult-onset type 2 diabetes mellitus with a strong  family history of coronary artery disease.  [name] reports that over the  last two or three years he has had occasional episodes of mild chest pain.  For evaluation of this, he apparently underwent a stress Cardiolite exam in  2004, which was felt to be low-risk for ongoing ischemia.  Approximately two  or three months ago, the patient started developing more frequent episodes  of chest pain.  Initially these symptoms were attributed to possible  symptoms of reflux or ulcer disease.  However, he began to notice worsening  symptoms of burning and severe substernal chest pain that were brought on by  physical activity and relieved by sublingual nitroglycerin.  As these  symptoms progressed, he ultimately returned to see Dr. Sudie Bailey for further  evaluation.  Last week he underwent a chest CT scan to follow up his known  history of pancreatitis.  Shortly after completing this examination while he  remained in the radiology department at [facility], he developed  severe substernal chest pain that persisted and radiates down both arms.   It  was associated with some radiation to his neck as well as nausea and  shortness of breath.  His pain ultimately was relieved following the  administration of three sublingual nitroglycerin tablets and IV  nitroglycerin drip.  He was promptly admitted to the hospital and  transferred to [facility] for further management.  The  patient had subsequently ruled out for acute myocardial infarction by serial  cardiac enzymes.  Mr. Carsten was taken for elective cardiac catheterization  today by Dr. Antoine Poche.  Findings at the time of catheterization include  severe three-vessel coronary artery disease with normal left ventricular  function.  Cardiac surgical consultation history been requested to consider  elective surgical revascularization.   REVIEW OF SYSTEMS:  GENERAL:  The patient reports that his appetite is  stable.  He has not been gaining or losing weight recently.  He does report  worsening exertional fatigue over the last few years.  CARDIAC:  Notable for  symptoms of exertional chest pain prior to the episode of severe chest pain  that occurred last week and prompted hospital admission.  He reports some  exertional shortness of  breath as well as occasional episodes of PND.  He  denies any orthopnea or lower extremity edema.  He has had occasional  palpitations and dizzy spells, but he denies syncopal episodes.  RESPIRATORY:  Notable for mild exertional shortness of breath.  The patient  has intermittent dry, nonproductive cough.  He denies productive cough,  hemoptysis, wheezing.  GASTROINTESTINAL:  Notable for longstanding problems  with indigestion, symptoms of reflux, some difficulty swallowing.  The  patient reports that occasionally food even seems to get stuck and has to  get thrown up afterwards, particularly after meals.  He has had problems  with abdominal pain in the past, although this does not seem to be ongoing  at present.  He reports occasional  constipation but otherwise no problems  with his bowel function.  He denies recent history of hematochezia,  hematemesis, or melena.  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  NEUROLOGIC:  Negative.  The patient denies symptoms suggestive of previous TIA or stroke.  GENITOURINARY:  Negative.  HEENT:  Negative.  ENDOCRINE:  Notable for  history of familial hyperlipidemia, which has been difficult to control.  The patient has also developed secondary diabetes.  PSYCHIATRIC:  Negative.   PAST MEDICAL HISTORY:  1.  Chronic pancreatitis, initially discovered in 2002.  The patient      eventually underwent pancreatic stent placement in June 2005 for      pancreatic duct stricture with known pancreatic pseudocysts.  Abdominal      CT scan performed last week at [facility] documents stable      radiographic appearance of two small pseudocysts with no other acute      changes.  2.  Familial hyperlipidemia.  3.  Adult-onset type 2 diabetes mellitus.   PAST SURGICAL HISTORY:  1.  Appendectomy for ruptured appendix in 1979.  2.  Cholecystectomy in 2001.  3.  Left knee surgery in 2002.  4.  Repair of deviated nasal septum.  5.  Repair of tendons in the middle fingers of his right hand.   FAMILY HISTORY:  The patient's mother died of acute myocardial infarction at  age 53.  The patient has several siblings who have early-onset coronary  artery disease.   SOCIAL HISTORY:  The patient is disabled and lives with his wife in  [place].  They have one daughter.  The patient is a nonsmoker and denies  a history of significant alcohol consumption.   MEDICATIONS PRIOR TO ADMISSION:  1.  Gemfibrozil 20 mg twice daily.  2.  Avandia 8 mg daily.  3.  Glipizide 10 mg twice daily.  4.  Pancrease tablets three tablets before every meal and two tablets before      every snack.  5.  Hydrocodone as needed for pain. 6.  Domperidone two tablets  before breakfast and dinner.  7.  Paxil 10 mg daily.  8.  Valium as needed.  9.  Prevacid 30 mg daily.  10. Enalapril 10 mg twice daily.   DRUG ALLERGIES:  1.  IV CONTRAST.  2.  PENICILLIN.  3.  SULFA.  4.  REGLAN.   PHYSICAL EXAMINATION:  GENERAL:  The patient is a well-appearing white male  who appears his stated age, in no acute distress.  VITAL SIGNS:  Blood pressure most recently measured 114/66 with a pulse 85  and normal sinus rhythm.  The patient has been afebrile.  HEENT:  Grossly unrevealing.  NECK:  Supple.  There is no cervical or supraclavicular lymphadenopathy.  There is no jugular venous distention.  No carotid bruits are noted.  CHEST:  Auscultation of the chest includes clear and symmetrical breath  sounds bilaterally.  No wheezes or rhonchi are demonstrated.  CARDIOVASCULAR:  Regular rate and rhythm.  No murmurs, rubs or gallops are  noted.  ABDOMEN:  Soft, nontender.  There are no palpable masses.  Bowel sounds are  present.  EXTREMITIES:  Warm and well-perfused.  There is no lower extremity edema.  Distal pulses are palpable in both lower legs at the ankle.  There appears  to be intact circulation in the left hand on palmar arch based upon normal  capillary refill despite compression of the left radial pulse.  RECTAL, GENITOURINARY:  Exams are both deferred.  NEUROLOGIC:  Grossly nonfocal and symmetrical throughout.   LABORATORY DATA:  Complete blood count obtained this morning includes  hemoglobin 13.9, hematocrit 39.5%, white blood count 6100, platelet count  143,000.  Serum electrolytes are within normal limits with a baseline  creatinine of 1.1 prior to catheterization.  The patient's blood sugars have  been very poorly controlled this hospital admission so far, and most recent  blood sugar measured 480 last night at 10 p.m.  Hemoglobin a1c measured 8.5.   DIAGNOSTIC TESTS:  Cardiac catheterization performed by Dr. Antoine Poche today  is reviewed.  This  demonstrates a left-dominant coronary circulation.  There  is severe three-vessel coronary artery disease.  Specifically, there is long-  segment 90% stenosis of the proximal and mid-left anterior descending  coronary artery.  The left circumflex coronary artery is a large vessel  which is dominant.  It gives rise to a single dominant posterolateral  branch, which supplies the majority of the posterolateral wall of the left  ventricle.  There is 95% proximal stenosis of this vessel.  There is 80%  stenosis of the distal left circumflex beyond this vessel prior to a small  posterior descending coronary artery.  The posterior descending coronary  artery may not be large enough for grafting.  Left ventricular function  appears normal with no wall motion abnormalities.   IMPRESSION:  Severe three-vessel coronary artery disease with class IV  unstable angina.  I believe that Mr. Danis would best be treated by elective coronary artery bypass grafting.  His diabetes not been well-controlled, and  his blood sugars are now completely out of control as he has been given  steroids in preparation for his recent catheterization.  This will need to  be addressed prior to surgery.   PLAN:  I have outlined the options at length with Mr. Volkov and his family.  They understand and accept all associated risks of surgery, including but  not limited to risk of death, stroke, myocardial infarction, congestive  heart failure, respiratory failure, pneumonia, bleeding requiring blood  transfusion, arrhythmia, infection, and recurrent coronary artery disease.  All of their questions have been addressed.      CHO/MEDQ  D:  10/19/2004  T:  10/20/2004  Job:  [id number]   cc:   Rollene Rotunda, M.D.   Mila Homer. Sudie Bailey, M.D.  [address] [place], [place] [address]  Fax: [phone]

## 2011-01-08 NOTE — Assessment & Plan Note (Signed)
[facility]                              CARDIOLOGY OFFICE NOTE   NAME:[name], [name]                      MRN:          [id number]  DATE:06/21/2006                            DOB:          12-23-1960    PRIMARY:  Dr. Sudie Bailey.   REASON FOR PRESENTATION:  Evaluate the patient with recent stenting.   HISTORY OF PRESENT ILLNESS:  The patient is a 50 year old gentleman with  coronary disease status post CABG in the past.  He presented with chest pain  on October 8, thought possibly to be unstable angina.  He ruled out for a  myocardial infarction.  He did have a cardiac catheterization that  demonstrated the LAD was moderate sized with an 80% proximal stenosis.  There was a LIMA to the diagonal which was widely patent.  It back filled  the LAD.  The ramus intermediate had a 50% stenosis.  There was a circumflex  with 40% stenosis proximally.  There was an 80% stenosis distally before the  origin of the PDA.  The 80% stenosis was treated with a bare metal stent.  There was a PDA which was totally occluded but filled a free radial graft  which was widely patent.  The relatively small, nondominant right coronary  was totally occluded with bridging collaterals.  The EF was 60%.   The patient now returns for followup status post bare metal stenting.  He  has done relatively well.  He had none of the chest discomfort that prompted  his admission.  He has had some fleeting atypical pains.  He has taken a  couple of sublingual nitroglycerins since discharge.  His pains have been  easily relieved.  He has not been having any classic symptoms such as  substernal chest pressure or neck discomfort.  He is not having any arm  discomfort.  He is not noticing any symptomatic palpitations with presyncope  or syncope.  He is not having any new PND or orthopnea.  He actually has  started doing a little exercising and did a little stair climbing and some  light  weights recently without significant limitations.   He does report some significant hypertriglyceridemia.  I do not have these  results.  This was on blood work done prior to admission.   PAST MEDICAL HISTORY:  1. Coronary artery disease as described.  2. Diabetes mellitus.  3. Chronic pancreatitis with pseudocysts.  4. Familial hyperlipidemia.  5. Appendectomy.  6. Cholecystectomy.  7. Left knee surgery.  8. Repair of deviated nasal septum.  9. Repair of tendons in the middle finger of his right hand.  10.Erectile dysfunction.   ALLERGIES:  1. INTRAVENOUS CONTRAST DYE.  2. PENICILLIN.  3. SULFA.  4. REGLAN.   MEDICATIONS:  1. Aspirin 325 mg daily.  2. Plavix 75 mg for 1 month.  3. Paxil 10 mg daily.  4. Glipizide 10 mg b.i.d.  5. Enalapril 2.5 mg a day.  6. Metformin 500 mg b.i.d.  7. Vytorin 10/40 nightly.  8. Lipram 3 capsules t.i.d. with meals.  9. Metoprolol 50 mg b.i.d.  10.Lantus 35 mg nightly.  11.Prevacid 30 mg daily.  12.Nitroglycerin.   REVIEW OF SYSTEMS:  As stated in the HPI and otherwise negative for other  systems.   PHYSICAL EXAMINATION:  The patient is in no distress.  Blood pressure 126/94.  Heart rate 78 and regular.  Weight 178 pounds. Body  mass index 25.  HEENT:  Eyes unremarkable.  Pupils equal, round, and reactive to light.  Fundi not visualized.  Oral mucosa unremarkable.  NECK:  No jugular venous distention at 45 degrees, carotid upstroke brisk  and symmetric, no bruits, no thyromegaly.  LYMPHATICS:  No adenopathy.  LUNGS:  Clear to auscultation bilaterally.  BACK:  No costovertebral angle tenderness.  CHEST:  Well-healed sternotomy scar without sternal mobility, erythema, or  exudate.  HEART:  PMI not displaced or sustained, S1 and S2 within normal limits, no  S3, no S4, no murmurs.  ABDOMEN:  Mildly obese, positive bowel sounds, normal in frequency and  pitch, no bruits, no rebound, no guarding.  No midline pulsatile mass or   organomegaly.  SKIN: No rashes, no nodules.  EXTREMITIES:  2+ right radial pulse, 2+ lower extremity pulses, status post  left radial harvest.  No cyanosis, or clubbing.  NEURO:  Oriented to person, place, and time.  Cranial nerves II through XII  grossly intact.  Motor grossly intact.   EKG sinus rhythm, rate 78, incomplete right bundle branch block, rightward  axis.  No acute ST-T wave changes, no change from previous EKGs.   ASSESSMENT AND PLAN:  1. Coronary disease.  The patient is having no symptoms consistent with      ongoing angina.  No further cardiovascular testing is suggested.  He      will continue with aggressive risk reduction.  2. Dyslipidemia.  I told him I would be happy to review his lipids if he      would like to me.  He is going to send the results.  This will be      managed by Dr. Sudie Bailey and I will share any suggestions I might have      with him.  It sounds like this has been a chronic and ongoing problem      with his hypertriglyceridemia.  3. Hypertension.  Blood pressure is well controlled and he will continue      the medications as listed.  4. Followup.  We will see him back in 4 months or sooner if needed.    ______________________________  Rollene Rotunda, MD, [facility]    JH/MedQ  DD: 06/21/2006  DT: 06/21/2006  Job #: [id number]   cc:   Mila Homer. Sudie Bailey, M.D.

## 2011-01-08 NOTE — Op Note (Signed)
NAMEKEVONTAY, [name]               ACCOUNT NO.:  [id number]   MEDICAL RECORD NO.:  [id number]          PATIENT TYPE:  INP   LOCATION:  2302                         FACILITY:  [facility]   PHYSICIAN:  Salvatore Decent. Cornelius Moras, M.D. DATE OF BIRTH:  01-17-1961   DATE OF PROCEDURE:  10/20/2004  DATE OF DISCHARGE:                                 OPERATIVE REPORT   PREOPERATIVE DIAGNOSIS:  Severe three-vessel coronary artery disease with  class IV unstable angina.   POSTOPERATIVE DIAGNOSIS:  Severe three-vessel coronary artery disease with  class IV unstable angina.   PROCEDURE:  Median sternotomy for off-pump coronary artery bypass grafting x  2 (left internal mammary artery to distal left anterior descending coronary  artery, left radial artery to left posterolateral branch).   SURGEON:  Dr. Purcell Nails.   ASSISTANT:  Evelene Croon, M.D.   SECOND ASSISTANT:  Pecola Leisure, PA   ANESTHESIA:  General.   BRIEF CLINICAL NOTE:  The patient is a 50 year old white male with familial  hypercholesterolemia, diabetes mellitus, and a strong family history of  coronary artery disease. He presents with symptoms of unstable angina and  cardiac catheterization performed by Dr. Rollene Rotunda demonstrates severe  three-vessel coronary artery disease with normal left ventricular function.  There is left dominant coronary circulation. A full consultation note has  been dictated previously.   OPERATIVE CONSENT:  The patient and his wife have been counseled at length  regarding the indications, risks, and potential benefits of coronary artery  bypass grafting. They understand and accept all associated risks of surgery  and desire to proceed as described.   OPERATIVE NOTE IN DETAIL:  The patient is brought to the operating room and  above-mentioned date and central monitoring was established by the  anesthesia service under the care and direction of Dr. Jairo Ben.  Specifically, a Swan-Ganz  catheter was placed through the right internal  jugular approach. A right radial arterial line was placed. Intravenous  antibiotics were administered. In the preoperative holding area, the  patient's left hand is again examined and a pulse oximetry probe was placed  on the patient's left index finger. Pulse oximetry waveform was continuously  monitored while intermittently occluding the left radial pulse. Obliteration  of the radial pulse does not significantly diminish the pulse oximetry  waveform.   The patient is brought to the operating room and placed in the supine  position on the operating table. General endotracheal anesthesia is induced  uneventfully. A Foley catheter was placed. The patient's chest, abdomen,  left upper extremity, both groins, and both lower extremities were prepared  and draped in a sterile manner.   Baseline transesophageal echocardiogram was performed by Dr. Jean Rosenthal. This  demonstrates normal left ventricular function. There is trivial mitral  regurgitation. No other abnormalities were noted.   A median sternotomy incision is performed in the left internal mammary  artery was dissected from the chest wall and prepared for bypass grafting.  The left internal mammary artery is slightly small caliber but otherwise  good quality and has normal flow. Simultaneously, the left radial artery was  harvested from the left forearm through a longitudinal incision along the  volar aspect of the left forearm. The artery was harvested from just below  the antecubital fossa to just above the wrist. All intervening side branches  of the artery and its associated veins were divided with the harmonic  scalpel.  Care was taken during the dissection to avoid dissection and  injury to the superficial thenar branch of the left radial nerve. After the  left radial artery is removed, both the proximal and distal stump of the  artery are oversewn with suture ligatures. The incision  is irrigated with  saline solution and inspected for hemostasis. The forearm incision is then  closed with a single running absorbable suture layer to close the  subcutaneous tissues followed by running subcuticular skin closure.   The patient is heparinized systemically. The pericardium was opened. The  heart is carefully inspected and distal sites for coronary bypass  identified. Of note, there is left dominant coronary circulation.  All of  the patients coronary arteries are relatively small.  The distal branches of  the non-dominant right coronary artery are very small and not suitable for  grafting. The distal branches of the left circumflex coronary artery beyond  the large posterolateral branch are also very small and not suitable for  grafting. The left internal mammary artery and left radial artery are both  prepared for bypass grafting and trimmed to appropriate lengths.   Off-pump coronary artery bypass grafting was performed using the Guidant  acrobat stabilization device with apical vacuum cup and additional  stabilization pad. Elastic vessel loops were placed around each distal  target proximally to assist with hemostasis. Intracoronary shunts are not  necessary. Exposure of stabilization is felt to be excellent.   Following distal coronary anastomoses were performed:  (1)  The posterolateral branch off the distal left circumflex coronary  artery is grafted with a left radial artery in end-to-side fashion. This  vessel was the dominant vessel along the entire inferior posterolateral wall  of the left ventricle. At the site of distal bypass it measured 1.5 mm in  diameter and is of good quality.  (2)  The distal left anterior descending coronary artery is grafted with  left internal mammary artery. This vessel was slightly small in caliber but  good quality, measuring 1.4 mm at the site of distal bypass.  After completion of both distal anastomoses, the heart was returned  to its  normal position. Construction of the distal bypass was performed without  significant hemodynamic compromise. Excellent back bleeding was appreciated  through the left radial artery graft. The radial artery graft is trimmed to  appropriate length and the proximal anastomoses constructed directly to the  ascending aorta under a separate partial occlusion clamp. After completion  of all bypass graft, the single proximal and both distal coronary  anastomoses were inspected for hemostasis and appropriate graft orientation.  Protamine was administered to reverse the anticoagulation.   The mediastinum and left chest are irrigated with saline solution containing  vancomycin. Meticulous surgical hemostasis ascertained. The mediastinum and  left and right pleural spaces were drained using three chest tubes exited  through separate stab incisions inferiorly. The median sternotomy was closed  with double-strength sternal wire. The soft tissues anterior to sternum were  closed in multiple layers and the skin was closed with a running  subcuticular skin closure.   The patient tolerated the procedure well and was transported directly to the  surgical intensive  care unit in stable condition. There are no  intraoperative complications. All sponge, instrument and needle counts were  verified correct at completion of the operation. No blood products were  administered.      CHO/MEDQ  D:  10/20/2004  T:  10/20/2004  Job:  [id number]   cc:   Rollene Rotunda, M.D.   Mila Homer. Sudie Bailey, M.D.  [address] [place], [place] [address]  Fax: [phone]

## 2011-01-08 NOTE — Group Therapy Note (Signed)
[facility]  Patient:    [name], [name] Visit Number: [id number] MRN: [id number]          Service Type: MED Location: 3A [phone] Attending Physician:  Rosalyn Charters Dictated by:   Butch Penny, M.D. Proc. Date: 08/03/01 Admit Date:  07/30/2001                               Progress Note  SUBJECTIVE:  This patient was admitted with abdominal pain, does have previous history of ulcer.  He was thought to have initially pancreatitis but this has not been borne out on his studies.  He does have what appears to be diverticulitis.  OBJECTIVE:  VITAL SIGNS:  Blood pressure 101/70, respirations 20, pulse 77, temperature 97.4.  LUNGS:  Clear to P&A.  Occasional rhonchus heard over lower lung field.  HEART:  Regular rhythm.  ABDOMEN:  No palpable organs or masses.  ASSESSMENT:  Patient is being treated for diverticulitis.  PLAN:  Plan to continue current regimen. Dictated by:   Butch Penny, M.D.  Attending Physician:  Rosalyn Charters DD:  08/03/01 TD:  08/03/01 Job: [id number]

## 2011-01-08 NOTE — H&P (Signed)
[name], [name] NO.:  [id number]   MEDICAL RECORD NO.:  [id number]          PATIENT TYPE:  INP   LOCATION:  2027                         FACILITY:  [facility]   PHYSICIAN:  Rollene Rotunda, M.D.   DATE OF BIRTH:  1961/04/02   DATE OF ADMISSION:  10/15/2004  DATE OF DISCHARGE:                                HISTORY & PHYSICAL   PRIMARY CARE PHYSICIAN:  Mila Homer. Sudie Bailey, M.D.   REASON FOR PRESENTATION:  Patient with chest pain.   HISTORY OF PRESENT ILLNESS:  The patient is a pleasant 50 year old gentleman  with history of chest pain. He had a chest perfusion study in 2004 which was  negative for any evidence of ischemia. In December of 2005 he started  noticing chest discomfort. This was slightly different than his previous  pain which he has had related to chronic pancreatitis. He did have some  epigastric discomfort, but would also get some substernal pain. He would  occasionally take nitroglycerin. He did have some severe episodes. He was  getting a GI evaluation today and a CT scan. He was looking at the results  when he developed severe discomfort. This was substernal. It was 10/10 in  intensity. He had some radiation down both arms. He had some neck  discomfort. He felt somewhat nauseated. He was short of breath. He came to  the emergency room at [facility] because of this. There, he did have some  improvement with sublingual nitroglycerin and eventually IV nitroglycerin.  He was also treated with aspirin and morphine.   The patient says that in retrospect he has had this discomfort typically  with exertion. It seems to be getting slowly worse and is coming on with  less exertion and more frequent and more intense. He denies any  palpitations, presyncope, or syncope. He has had no PND or orthopnea.   PAST MEDICAL HISTORY:  Chronic pancreatitis,  peptic ulcer disease, diabetes  mellitus related to chronic pancreatitis, hyperlipidemia, and chronic  pain.   PAST SURGICAL HISTORY:  Cholecystectomy, knee surgery, appendectomy.   ALLERGIES:  CONTRAST DYE causes hives; SULFA; PENICILLIN.   MEDICATIONS:  1.  Gemfibrozil 600 mg q.12h.  2.  Avandia 8 mg daily.  3.  Diazepam 10 mg t.i.d.  4.  Pantocaps three tablets before meals and one before snacks.  5.  Hydrocodone.  6.  Paroxetine 10 mg daily.  7.  Enalapril 20 mg daily.  8.  Oxycodone 5/500 one tablet six times per day.  9.  Domperidone 10 mg b.i.d. before meals.  10. Nizoral each nostril b.i.d.  11. Prevacid 30 mg daily.   SOCIAL HISTORY:  The patient is married. He is disabled. He has never smoked  cigarettes. He does not drink alcohol.   FAMILY HISTORY:  Contributory for his mother having myocardial infarction at  age 60.   REVIEW OF SYSTEMS:  As stated in HPI. Positive for fever, chills, diarrhea,  and constipation. Negative for all other systems.   PHYSICAL EXAMINATION:  GENERAL: The patient is in no acute distress.  VITAL SIGNS: Blood pressure 118/73, heart rate  95 and regular.  HEENT: Eyes are unremarkable. Pupils equal, round, and reactive to light.  Fundi not visualized. Oral mucosa unremarkable.  NECK: No jugular venous distention. Wave form within normal limits. Carotid  upstrokes brisk and symmetrical. No bruits or thyromegaly.  LYMPHATICS: No cervical, axillary, or inguinal adenopathy.  LUNGS: Clear to auscultation bilaterally.  BACK: No costovertebral angle tenderness.  CHEST: Unremarkable.  HEART: PMI not displaced or sustained. S1 and S2 within normal limits. No  S3. No S4. No murmurs.  ABDOMEN: Mild tenderness in the midline right upper quadrant. Not increased  by baseline report. No rebound, guarding. No hepatomegaly, splenomegaly, or  bruits. No midline pulsatile masses.  SKIN: No rashes. No nodules.  EXTREMITIES: 2+ pulses.  No edema. No cyanosis or clubbing.  NEUROLOGIC: Oriented to person, place, and time. Cranial nerves II-XII  grossly intact.  Motor grossly intact.   EKG reveals sinus rhythm with rate of 95, axis within normal limits,  intervals within normal limits, inferior and anterolateral T-wave inversions  without old EKGs for comparison and consistent with ischemia.   LABORATORY DATA:  Cardiac markers times two negative. WBC 6.1, hemoglobin  17.5, platelet count 176,000. Sodium 127, potassium 4.3, BUN 12, creatinine  0.8, lipase 22.   ASSESSMENT/PLAN:  1.  Chest discomfort. The patient's chest discomfort has some worrisome      features. He certainly has cardiovascular risk factors. Given this he      has to be assumed to have unknown coronary syndrome until proven      otherwise. He will be admitted with heparin, beta blocker, aspirin, and      IV nitroglycerin titrated to pain. I have discussed the risks and      benefits of cardiac catheterization with the patient and his wife, and      they agree to proceed which will be done electively. He will need IV      contrast prophylaxis prior to this. We will look towards risk reduction      by      checking a lipid profile.  2.  Chronic pain. Will continue on his pain regimen.  3.  Diabetes. He will receive his usual medications with sliding scale      insulin.      JH/MEDQ  D:  10/15/2004  T:  10/15/2004  Job:  [id number]   cc:   Mila Homer. Sudie Bailey, M.D.  [address] [place], [place] [address]  Fax: [phone]

## 2011-01-08 NOTE — Consult Note (Signed)
NAMEMARWIN, PRIMMER               ACCOUNT NO.:  [id number]   MEDICAL RECORD NO.:  [id number]          PATIENT TYPE:  INP   LOCATION:  2915                         FACILITY:  [facility]   PHYSICIAN:  Jesse Sans. Wall, MD, FACCDATE OF BIRTH:  1961-05-02   DATE OF CONSULTATION:  DATE OF DISCHARGE:                                   CONSULTATION   CHIEF COMPLAINT:  Elephant sitting on my chest, starting about 8:45 this  morning followed by some nausea.   HISTORY OF PRESENT ILLNESS:  [name] is a 50 year old married  white male from [place] with known coronary disease.  He presented with  chest discomfort that was quite worrisome for angina in February 2006.  He  underwent coronary catheterization, showed an ejection fraction 60% with  mild inferior wall hypokinesia, totally occluded non-dominant right coronary  artery with scant bridging collaterals.  A large circumflex had high-grade  95% stenosis and a very large posterior lateral branch and then some distal  circ disease as well, and 80% proximal long LAD stenosis.   He was seen by Dr. Tressie Stalker.  He underwent coronary bypass grafting on  October 20, 2004 with a left internal mammary graft of the LAD a left  radial artery to the posterior lateral.   Discussion with Dr. Cornelius Moras today implied that there was small distal disease.  He did have a distal totally occluded non-dominant right coronary vessel  that had collaterals that was not grafted.   He presented this morning with the above complaints.  Over the past several  weeks, he has had an uneasy feeling in his chest.  He actually saw Dr. Cornelius Moras  in the office about a week and a half ago and had a chest CT.  This showed  no significant abnormalities.  He had had a mean sternotomy revision from  poor healing with his diabetes in March of 2006.   His presentation to [facility], he had an EKG that showed sinus  rhythm, RSR prime in V1 and V2 with a rightward axis.   His cardiac enzyme  markers x1 were negative.  The rest his blood work was unremarkable.  He  received significant pain relief with sublingual nitroglycerin.  He  complains now of a headache and some occasional sharp shooting pains into  his chest.   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  This was in 2002.  He has familial  hyperlipidemia with severe hypertriglyceridemia.  This could have  contributed.  He has had a stent placed the pancreas in 2005.  He had an  appendectomy, cholecystectomy, deviated septum repair and a right hand  surgical procedure.   ALLERGIES:  HE IS ALLERGIC TO IV CONTRAST, CAUSES HIVES; PENICILLIN, SULFA,  REGLAN, AND HE IS INTOLERANT OF STATINS.   SOCIAL HISTORY:  He lives in [place] with his wife.  He is disabled.  He  has one daughter.  He denies any tobacco, alcohol or drug use.   FAMILY HISTORY:  He has a very positive family history of coronary  disease,  with his mother dying of an MI at age 29.   REVIEW OF SYSTEMS:  Is negative x 12 systems reviewed.   EXAM:  VITAL SIGNS:  His blood pressure is 107/64.  His pulse is 72 and  sinus rhythm, O2 SATs 99%, respirations 15.  He is afebrile.  SKIN:  Warm  and dry.  He is in no acute distress.  HEENT:  He wears glasses.  He has got a mustache.  He is graying in a  balding hair pattern.  His dentition is satisfactory.  PERRLA.  Extraocular  is intact.  Sclerae are clear.  Carotids full without bruits.  No JVD.  Thyroid is not enlarged.  Trachea is midline.  LUNGS:  Clear.  HEART:  Reveals regular rate and rhythm.  There is no rub.  S2 splits  physiologically.  Sternotomy site is stable.  ABDOMEN:  Soft with good bowel sounds.  There is no tenderness.  There is no  midline bruits, no hepatomegaly.  EXTREMITIES:  No sinus clubbing or edema.  Pulses are brisk.  NEURO:  Intact.   ASSESSMENT:  1. Unstable angina in a patient with known coronary  disease status post      coronary bypass grafting as above.  He has diffuse distal disease and a      totally occluded non-dominant right that was not graftable.  2. History of a median sternotomy revision with recent CT scan being      negative with Dr. Cornelius Moras for atypical chest pain.  3. Familial hyperlipidemia.  4. Type 2 diabetes.  5. Chronic pancreatitis.  6. DYE ALLERGY.   PLAN:  1. Cardiac catheterization with left _________ and grafts tomorrow      morning.  Indications, risks, potential  benefits have been discussed      with the patient his wife and family.  They agree to proceed.  2. Hydration.  3. Continue IV nitro and Heparin to me including continue IV Heparin.  4. Dye allergy prophylaxis.      Thomas C. Daleen Squibb, MD, [facility]  Electronically Signed     TCW/MEDQ  D:  05/30/2006  T:  06/01/2006  Job:  [id number]   cc:   Mila Homer. Sudie Bailey, M.D.  Rollene Rotunda, MD, [facility]  Salvatore Decent. Cornelius Moras, M.D.

## 2011-01-08 NOTE — Assessment & Plan Note (Signed)
[facility]                               LIPID CLINIC NOTE   NAME:[name], [name] FULGHUM                      MRN:          [id number]  DATE:11/10/2006                            DOB:          1961-05-20    [name] comes in today for followup of his hyperlipidemia treatment.  His current medications for cholesterol lowering include omega-3 flax  seed oil 2 grams daily, niacin over the counter 500 mg daily, and  Vytorin 10/40 one daily.  He is tolerating these medications just fine  and denies any muscle aches and pains at this time.  His other  medications have not changed and they include metoprolol, Lipram,  domperidone, paroxetine, Glipizide, metformin, Lantus, Prevacid,  Enalapril, Plavix, Aspirin 81mg .   PHYSICAL EXAMINATION:  Weight is 180 pounds, blood pressure is 120/85,  heart rate is 64.   LABORATORY DATA:  Includes total cholesterol 242, triglycerides 1108,  HDL 30, LDL was not able to be calculated.  Liver function tests are  within normal limits.  His hemoglobin A1c was 7.5, fasting glucose was  149.   ASSESSMENT:  [name] has familiar hypertriglyceridemia.  His  triglyceride goal is less than 150, HDL goal greater than 40, LDL goal  of less than 70.  IN THE PAST HE HAS BEEN INTOLERANT TO TRICOR,  REPORTING BACK SPASMS.  ALSO WITH MEVACOR HE EXPERIENCED THE SAME THING  PLUS ABDOMINAL PAIN.  It appears that his diabetes is not perfectly  controlled and this contributes to his high triglycerides.  We talked  about the different types of omega-3 fatty acids, differences between  over the counter niacin and the Niaspan prescription product.   PLAN:  We are going to stop the flax seed omega-3 fish oil and start  omega-3 fish oil supplement over the counter, I recommended it contain  EPA and DHA fatty acids and we are going to start with 2 grams a day,  increasing to 4 grams per day as tolerated.  We are going to stop the  over the counter  niacin and start Niaspan 500 mg per night, increasing  to 1000 mg per night if he tolerates.  I gave him information on how to  limit the flushing reaction often associated with Niaspan.  I encouraged  him to increase the amount of exercise he is getting which is typically  just walking.  I gave him a diet handout on low cholesterol, low fat  diet and encouraged him to make a commitment to lifestyle modifications  as well as the pharmacotherapy.  I asked him to follow up with [place] in 6  weeks.  He is going to have his blood drawn at [facility] and  they will fax those results to [place].  We are going to check a lipid  panel, liver function tests, as well as a BMET to see what his fasting  glucose is looking like. He was instructed to call us with any concerns  or problems in the meantime.      Charolotte Eke, PharmD  Electronically Signed  Rollene Rotunda, MD, [facility]  Electronically Signed   TP/MedQ  DD: 11/10/2006  DT: 11/10/2006  Job #: [id number]

## 2011-01-08 NOTE — Group Therapy Note (Signed)
[facility]  Patient:    [name], [name] Visit Number: [id number] MRN: [id number]          Service Type: MED Location: 3A [id number] Attending Physician:  Rosalyn Charters Dictated by:   John Giovanni, M.D. Admit Date:  07/30/2001                               Progress Note  SUBJECTIVE:  Patients left lower quadrant abdominal pain is generally better. Seemed to be doing okay yesterday but got quite jittery last night so Dr. Kari Baars reinstituted Dilaudid 2 mg IV q.2h. overnight.  Patient has done better today just on Tylox and off the Dilaudid.  As an outpatient, he was on diazepam 10 mg t.i.d. and as an inpatient, he has been on alprazolam 0.5 mg.  Patient notes the jittery feelings are similar to those he was having when we were trying to decrease his Duragesic patch.  He has had pancreatitis most of the year and has been on narcotics most of the year for the pain from the pancreatitis.  He is beginning to feel hungry again but he is still on clear liquids.  Of note, he has had a problem urinating the last couple of days but today, off the Dilaudid, he urinated well and without difficulty starting the urinary stream.  He has had no constipation.  OBJECTIVE:  He is walking around the room.  He is in no acute distress. Temperature is 97.1, pulse 76, respiratory rate 20, blood pressure 123/85. Accu-Cheks have been 89, 124 and 95 today.  The heart has a regular rhythm without murmur, rate of 80.  The lungs are clear throughout.  The abdomen is soft without hepatosplenomegaly or mass.  He has minimal tenderness in the left lower quadrant; this is only elicited with deep palpation.  ASSESSMENT: 1. Diverticulitis. 2. Narcotic habituation. 3. Generalized anxiety disorder. 4. Hyperlipidemia.  PLAN:  Will go back to low-fat diet tomorrow.  Plan to discharge tomorrow if he is doing well.  We will have to send him home on narcotics and  antibiotics. We will plan on getting him off the Duragesic and the Tylox in the new year. Reinstitute diazepam 10 mg tonight. Dictated by:   John Giovanni, M.D. Attending Physician:  Rosalyn Charters DD:  08/04/01 TD:  08/05/01 Job: [id number]

## 2011-03-10 ENCOUNTER — Other Ambulatory Visit: Payer: Self-pay

## 2011-03-10 ENCOUNTER — Emergency Department (HOSPITAL_COMMUNITY)
Admission: EM | Admit: 2011-03-10 | Discharge: 2011-03-11 | Disposition: A | Discharge status: Other Acute Inpatient Hospital | Payer: Medicare Other | Source: Home / Self Care | Attending: Emergency Medicine | Admitting: Emergency Medicine

## 2011-03-10 ENCOUNTER — Emergency Department (HOSPITAL_COMMUNITY): Payer: Medicare Other

## 2011-03-10 ENCOUNTER — Encounter (HOSPITAL_COMMUNITY): Payer: Self-pay | Admitting: *Deleted

## 2011-03-10 DIAGNOSIS — Z8249 Family history of ischemic heart disease and other diseases of the circulatory system: Secondary | ICD-10-CM | POA: Insufficient documentation

## 2011-03-10 DIAGNOSIS — R079 Chest pain, unspecified: Secondary | ICD-10-CM | POA: Insufficient documentation

## 2011-03-10 DIAGNOSIS — Z951 Presence of aortocoronary bypass graft: Secondary | ICD-10-CM | POA: Insufficient documentation

## 2011-03-10 DIAGNOSIS — Z9861 Coronary angioplasty status: Secondary | ICD-10-CM | POA: Insufficient documentation

## 2011-03-10 DIAGNOSIS — Z833 Family history of diabetes mellitus: Secondary | ICD-10-CM | POA: Insufficient documentation

## 2011-03-10 DIAGNOSIS — I2510 Atherosclerotic heart disease of native coronary artery without angina pectoris: Secondary | ICD-10-CM | POA: Insufficient documentation

## 2011-03-10 DIAGNOSIS — E119 Type 2 diabetes mellitus without complications: Secondary | ICD-10-CM | POA: Insufficient documentation

## 2011-03-10 DIAGNOSIS — M79609 Pain in unspecified limb: Secondary | ICD-10-CM | POA: Insufficient documentation

## 2011-03-10 HISTORY — DX: Stenosis of other vascular prosthetic devices, implants and grafts, initial encounter: T82.858A

## 2011-03-10 LAB — CARDIAC PANEL(CRET KIN+CKTOT+MB+TROPI)
CK, MB: 2.5 ng/mL (ref 0.3–4.0)
Total CK: 41 U/L (ref 7–232)
Troponin I: <0.3 ng/mL (ref <0.30)

## 2011-03-10 LAB — CBC
Platelets: 147 10*3/uL — ABNORMAL LOW (ref 150–400)
RBC: 5.48 MIL/uL (ref 4.22–5.81)
WBC: 5.4 10*3/uL (ref 4.0–10.5)

## 2011-03-10 LAB — BASIC METABOLIC PANEL
CO2: 22 mEq/L (ref 19–32)
Calcium: 9.3 mg/dL (ref 8.4–10.5)
Chloride: 100 mEq/L (ref 96–112)
Potassium: 4.4 mEq/L (ref 3.5–5.1)
Sodium: 135 mEq/L (ref 135–145)

## 2011-03-10 MED ORDER — NITROGLYCERIN 0.4 MG SL SUBL
0.4000 mg | SUBLINGUAL_TABLET | Freq: Once | SUBLINGUAL | Status: AC
  Administered 2011-03-10: 0.4 mg via SUBLINGUAL
  Filled 2011-03-10: qty 25

## 2011-03-10 MED ORDER — NITROGLYCERIN IN D5W 200-5 MCG/ML-% IV SOLN
5.0000 ug/min | Freq: Once | INTRAVENOUS | Status: AC
  Administered 2011-03-10: 5 ug/min via INTRAVENOUS
  Filled 2011-03-10: qty 250

## 2011-03-10 MED ORDER — MORPHINE SULFATE 4 MG/ML IJ SOLN
INTRAMUSCULAR | Status: AC
  Filled 2011-03-10: qty 1

## 2011-03-10 MED ORDER — MORPHINE SULFATE 4 MG/ML IJ SOLN: 4.0000 mg | Freq: Once | INTRAMUSCULAR | Status: DC

## 2011-03-10 MED ORDER — ASPIRIN 325 MG PO TABS
325.0000 mg | ORAL_TABLET | Freq: Once | ORAL | Status: AC
  Administered 2011-03-10: 325 mg via ORAL
  Filled 2011-03-10: qty 1

## 2011-03-10 MED ORDER — ONDANSETRON HCL 4 MG/2ML IJ SOLN
INTRAMUSCULAR | Status: AC
  Filled 2011-03-10: qty 2

## 2011-03-10 MED ORDER — MORPHINE SULFATE 4 MG/ML IJ SOLN
4.0000 mg | Freq: Once | INTRAMUSCULAR | Status: AC
  Administered 2011-03-10: 22:00:00 via INTRAVENOUS

## 2011-03-10 MED ORDER — ONDANSETRON HCL 4 MG/2ML IJ SOLN
4.0000 mg | Freq: Once | INTRAMUSCULAR | Status: AC
  Administered 2011-03-10: 4 mg via INTRAVENOUS

## 2011-03-10 NOTE — ED Provider Notes (Addendum)
History     Chief Complaint  Patient presents with  . Chest Pain   HPI Comments: Pt c/o chest pain intermittently for the past few days, onsets while at rest, located in lower central chest, radiating up sternum and to bilateral upper arms today. Dr. Antoine Poche cardiologist, h/o CAD, CABG, stents, last cath 2006 (possibly 2008). NTG >1 hour ago, pain starting to return. No ASA taken today. Pain similar to past ulcer pain except for the radiation, no bloody or black stools, no burning pain, pain not worse with eating, no n/v.   Patient is a 50 y.o. male presenting with chest pain. The history is provided by the patient.  Chest Pain The chest pain began 3 - 5 days ago. Chest pain occurs intermittently. The chest pain is worsening (much worse today in severity with radiation to his BUEs). At its most intense, the pain is at 10/10. The pain is currently at 7/10. The quality of the pain is described as sharp. The pain radiates to the left arm and right arm (up sternum to arms). Exacerbated by: nothing. Pertinent negatives for primary symptoms include no fever, no shortness of breath, no cough, no palpitations, no abdominal pain and no nausea.  Pertinent negatives for associated symptoms include no diaphoresis, no numbness and no weakness. He tried nitroglycerin and narcotics for the symptoms. Risk factors include male gender (h/o CAD).     Past Medical History  Diagnosis Date  . Coronary artery disease     Patent Stent to Circ.  Patent coronary bypass grafts with a free radial graft toPDA and LIMA to the  diag.  . Diabetes mellitus   . Pancreatitis chronic     Pseudocyst  . Dyslipidemia     Poorly controlled, probably related to his DM (Some of this is secondary to his inability to afford medications).  . ED (erectile dysfunction)   . 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 Appendectomy   . Cholecystectomy   . Knee surgery     left knee  . Nasal septum  surgery   . Finger tendon repair     in middle finger  . Coronary artery bypass graft 11/24/2004  . Cardiac catheterization     Family History  Problem Relation Age of Onset  . Coronary artery disease Mother     strong family history of  . Heart attack Mother     dying of (at age 69)  . Diabetes Mother   . Diabetes Sister   . Diabetes Brother     History  Substance Use Topics  . Smoking status: Never Smoker   . Smokeless tobacco: Not on file  . Alcohol Use: No      Review of Systems  Constitutional: Negative for fever, chills and diaphoresis.  HENT: Negative for neck pain and neck stiffness.   Eyes: Negative for pain.  Respiratory: Negative for cough and shortness of breath.   Cardiovascular: Positive for chest pain. Negative for palpitations and leg swelling.  Gastrointestinal: Negative for nausea and abdominal pain.  Genitourinary: Negative for dysuria.  Musculoskeletal: Negative for back pain.  Skin: Negative for rash.  Neurological: Negative for weakness, numbness and headaches.  All other systems reviewed and are negative.    Physical Exam  BP 154/99  Pulse 59  Temp 97.5 F (36.4 C)  Resp 20  Ht 5\' 9"  (1.753 m)  SpO2 99%  Physical Exam  Constitutional: He  is oriented to person, place, and time. He appears well-developed and well-nourished.  HENT:  Head: Normocephalic and atraumatic.  Eyes: Conjunctivae and EOM are normal. Pupils are equal, round, and reactive to light.  Neck: Trachea normal. Neck supple. No thyromegaly present.  Cardiovascular: Normal rate, regular rhythm, S1 normal, S2 normal and normal pulses.     No systolic murmur is present   No diastolic murmur is present  Pulses:      Radial pulses are 2+ on the right side, and 2+ on the left side.  Pulmonary/Chest: Effort normal and breath sounds normal. He has no wheezes. He has no rhonchi. He has no rales. He exhibits no tenderness.  Abdominal: Soft. Normal appearance and bowel sounds are  normal. There is no tenderness. There is no CVA tenderness and negative Murphy's sign.  Musculoskeletal:       BLE:s Calves nontender, no cords or erythema, negative Homans sign  Neurological: He is alert and oriented to person, place, and time. He has normal strength. No cranial nerve deficit or sensory deficit. GCS eye subscore is 4. GCS verbal subscore is 5. GCS motor subscore is 6.  Skin: Skin is warm and dry. No rash noted. He is not diaphoretic.  Psychiatric: His speech is normal.       Cooperative and appropriate    ED Course  Procedures  Results for orders placed during the hospital encounter of 03/10/11  CBC      Component Value Range   WBC 5.4  4.0 - 10.5 (K/uL)   RBC 5.48  4.22 - 5.81 (MIL/uL)   Hemoglobin 17.1 (*) 13.0 - 17.0 (g/dL)   HCT 40.9  81.1 - 91.4 (%)   MCV 86.3  78.0 - 100.0 (fL)   MCH 31.2  26.0 - 34.0 (pg)   MCHC 36.2 (*) 30.0 - 36.0 (g/dL)   RDW 78.2  95.6 - 21.3 (%)   Platelets 147 (*) 150 - 400 (K/uL)  BASIC METABOLIC PANEL      Component Value Range   Sodium 135  135 - 145 (mEq/L)   Potassium 4.4  3.5 - 5.1 (mEq/L)   Chloride 100  96 - 112 (mEq/L)   CO2 22  19 - 32 (mEq/L)   Glucose, Bld 161 (*) 70 - 99 (mg/dL)   BUN 17  6 - 23 (mg/dL)   Creatinine, Ser 0.86  0.50 - 1.35 (mg/dL)   Calcium 9.3  8.4 - 57.8 (mg/dL)   GFR calc non Af Amer >60  >60 (mL/min)   GFR calc Af Amer >60  >60 (mL/min)  CARDIAC PANEL(CRET KIN+CKTOT+MB+TROPI)      Component Value Range   Total CK 41  7 - 232 (U/L)   CK, MB 2.5  0.3 - 4.0 (ng/mL)   Troponin I <0.30  <0.30 (ng/mL)   Relative Index RELATIVE INDEX IS INVALID  0.0 - 2.5    Chest Portable 1 View  03/10/2011  *RADIOLOGY REPORT*  Clinical Data: Chest pain  PORTABLE CHEST - 1 VIEW  Comparison: 05/21/2009  Findings: Cardiomegaly.  CABG.  Clear lung fields.  No bony abnormality.  No change from priors.  IMPRESSION: No active disease.  Original Report Authenticated By: Elsie Stain, M.D.     Date: 03/10/2011  Rate:  62  Rhythm: normal sinus rhythm  QRS Axis: normal  Intervals: normal  ST/T Wave abnormalities: nonspecific ST changes  Conduction Disutrbances:none  Narrative Interpretation: incomplete RBBB  Old EKG Reviewed: unchanged    10:20 PM Pt  recheck: pt still c/o headache and some chest pain  MDM D/w CAR Dr Shirlee Latch with [facility] cardiology accepts for transfer and for possible cath in am 10:26 PM   I personally performed the services described in this documentation, which was scribed in my presence. The recorded information has been reviewed and considered.    Written by Enos Fling acting as scribe for Sunnie Nielsen, MD.   Sunnie Nielsen, MD 03/10/11 1610  Sunnie Nielsen, MD 03/11/11 [phone]

## 2011-03-10 NOTE — ED Notes (Signed)
Pt c/o intermittent chest pain x 2 wks that got worse 1 hr pta. Pt states he had sudden  Sharp chest pain radiating to both arms. Pt took 1 nitro and 1/2 of a 7.5/650 hydrocodone. Rated pain at onset greater than 10. Pt rates pain a 6-7 now with slight headache.

## 2011-03-11 ENCOUNTER — Inpatient Hospital Stay (HOSPITAL_COMMUNITY)
Admission: AD | Admit: 2011-03-11 | Discharge: 2011-03-12 | DRG: 369 | Disposition: A | Discharge status: Home / Self Care | Payer: Medicare Other | Source: Other Acute Inpatient Hospital | Attending: Cardiology | Admitting: Cardiology

## 2011-03-11 ENCOUNTER — Other Ambulatory Visit: Payer: Self-pay | Admitting: Internal Medicine

## 2011-03-11 DIAGNOSIS — K209 Esophagitis, unspecified without bleeding: Secondary | ICD-10-CM | POA: Diagnosis present

## 2011-03-11 DIAGNOSIS — K2960 Other gastritis without bleeding: Secondary | ICD-10-CM

## 2011-03-11 DIAGNOSIS — Z794 Long term (current) use of insulin: Secondary | ICD-10-CM

## 2011-03-11 DIAGNOSIS — F3289 Other specified depressive episodes: Secondary | ICD-10-CM | POA: Diagnosis present

## 2011-03-11 DIAGNOSIS — Z7902 Long term (current) use of antithrombotics/antiplatelets: Secondary | ICD-10-CM

## 2011-03-11 DIAGNOSIS — I2510 Atherosclerotic heart disease of native coronary artery without angina pectoris: Secondary | ICD-10-CM | POA: Diagnosis present

## 2011-03-11 DIAGNOSIS — K219 Gastro-esophageal reflux disease without esophagitis: Secondary | ICD-10-CM | POA: Diagnosis present

## 2011-03-11 DIAGNOSIS — R079 Chest pain, unspecified: Secondary | ICD-10-CM

## 2011-03-11 DIAGNOSIS — K861 Other chronic pancreatitis: Secondary | ICD-10-CM | POA: Diagnosis present

## 2011-03-11 DIAGNOSIS — B3781 Candidal esophagitis: Principal | ICD-10-CM | POA: Diagnosis present

## 2011-03-11 DIAGNOSIS — E785 Hyperlipidemia, unspecified: Secondary | ICD-10-CM | POA: Diagnosis present

## 2011-03-11 DIAGNOSIS — Z7982 Long term (current) use of aspirin: Secondary | ICD-10-CM

## 2011-03-11 DIAGNOSIS — E781 Pure hyperglyceridemia: Secondary | ICD-10-CM | POA: Diagnosis present

## 2011-03-11 DIAGNOSIS — Z951 Presence of aortocoronary bypass graft: Secondary | ICD-10-CM

## 2011-03-11 DIAGNOSIS — K2970 Gastritis, unspecified, without bleeding: Secondary | ICD-10-CM | POA: Diagnosis present

## 2011-03-11 DIAGNOSIS — F329 Major depressive disorder, single episode, unspecified: Secondary | ICD-10-CM | POA: Diagnosis present

## 2011-03-11 DIAGNOSIS — E119 Type 2 diabetes mellitus without complications: Secondary | ICD-10-CM | POA: Diagnosis present

## 2011-03-11 DIAGNOSIS — K3184 Gastroparesis: Secondary | ICD-10-CM | POA: Diagnosis present

## 2011-03-11 LAB — HEPATIC FUNCTION PANEL
AST: 73 U/L — ABNORMAL HIGH (ref 0–37)
Bilirubin, Direct: 0.1 mg/dL (ref 0.0–0.3)
Indirect Bilirubin: 0.4 mg/dL (ref 0.3–0.9)

## 2011-03-11 LAB — LIPASE, BLOOD: Lipase: 27 U/L (ref 11–59)

## 2011-03-11 LAB — PROTIME-INR: Prothrombin Time: 12.5 seconds (ref 11.6–15.2)

## 2011-03-11 LAB — CBC
HCT: 44.6 % (ref 39.0–52.0)
Hemoglobin: 16 g/dL (ref 13.0–17.0)
MCH: 30.4 pg (ref 26.0–34.0)
MCHC: 35.9 g/dL (ref 30.0–36.0)
RDW: 13.5 % (ref 11.5–15.5)

## 2011-03-11 LAB — DIFFERENTIAL
Eosinophils Relative: 4 % (ref 0–5)
Lymphocytes Relative: 31 % (ref 12–46)
Monocytes Absolute: 0.4 10*3/uL (ref 0.1–1.0)
Monocytes Relative: 9 % (ref 3–12)
Neutro Abs: 2.7 10*3/uL (ref 1.7–7.7)

## 2011-03-11 LAB — BASIC METABOLIC PANEL
Chloride: 100 mEq/L (ref 96–112)
GFR calc Af Amer: >60 mL/min (ref >60)
GFR calc non Af Amer: >60 mL/min (ref >60)
Potassium: 3.9 mEq/L (ref 3.5–5.1)
Sodium: 136 mEq/L (ref 135–145)

## 2011-03-11 LAB — GLUCOSE, CAPILLARY
Glucose-Capillary: 127 mg/dL — ABNORMAL HIGH (ref 70–99)
Glucose-Capillary: 152 mg/dL — ABNORMAL HIGH (ref 70–99)
Glucose-Capillary: 152 mg/dL — ABNORMAL HIGH (ref 70–99)
Glucose-Capillary: 91 mg/dL (ref 70–99)
Glucose-Capillary: 217 mg/dL — ABNORMAL HIGH (ref 70–99)

## 2011-03-11 LAB — LIPID PANEL
LDL Cholesterol: UNDETERMINED mg/dL (ref 0–99)
Triglycerides: 1016 mg/dL — ABNORMAL HIGH (ref <150)
VLDL: UNDETERMINED mg/dL (ref 0–40)

## 2011-03-11 LAB — CK TOTAL AND CKMB (NOT AT ARMC)
Relative Index: INVALID (ref 0.0–2.5)
Total CK: 73 U/L (ref 7–232)

## 2011-03-11 LAB — HEMOGLOBIN A1C: Hgb A1c MFr Bld: 8 % — ABNORMAL HIGH (ref <5.7)

## 2011-03-11 LAB — CARDIAC PANEL(CRET KIN+CKTOT+MB+TROPI)
CK, MB: 2.6 ng/mL (ref 0.3–4.0)
Troponin I: <0.3 ng/mL (ref <0.30)

## 2011-03-11 NOTE — H&P (Signed)
NAMEMarland Kitchen  [name], [name] NO.:  [id number]  MEDICAL RECORD NO.:  [id number]  LOCATION:  2607                         FACILITY:  [facility]  PHYSICIAN:  Marca Ancona, MD      DATE OF BIRTH:  Mar 09, 1961  DATE OF ADMISSION:  03/11/2011 DATE OF DISCHARGE:                             HISTORY & PHYSICAL   PRIMARY CARDIOLOGIST:  Rollene Rotunda, MD, [facility]  PRIMARY CARE PHYSICIAN:  Mila Homer. Sudie Bailey, MD  HISTORY OF PRESENT ILLNESS:  This is a 50 year old with history of coronary artery disease status post bypass surgery, chronic pancreatitis, peptic ulcer disease, noncardiac chest pain who presents with recurrent chest pain episodes.  The patient has had soreness in his epigastrium radiating to his neck and down his arms for the last 2 weeks on and off.  He can think of no particular trigger for the episodes. They are not related to exertion or to food.  They are sometimes relieved with Vicodin, sometimes relieved with ibuprofen.  Today the patient was pulling a drawer out of a chest and he had the acute onset of epigastric pain.  This radiated up his chest to his neck and then down his arms bilaterally.  This episode hurt worse than the prior episodes.  Therefore the patient went to the emergency department.  He says that hydrocodone helped and he was started on nitroglycerin drip and he said that this seemed to help but it took a while before it made any difference.  Pain lasted number of hours.  It is now 1/10 on a pain scale.  He is currently on a nitroglycerin drip.  The pain does not seem like his prior chronic pancreatitis pain which is usually located in the left upper quadrant.  It seems to be most similar to pain from his prior gastric ulcer.  His cardiac enzymes are negative x2.  His EKG shows no change from prior.  ALLERGIES:  The patient has a CONTRAST allergy.  He is also allergic to PENICILLIN and SULFA.  MEDICATIONS: 1. Aspirin 81 mg daily. 2. Plavix 75  mg daily. 3. Diazepam 10 mg t.i.d. 4. Enalapril 2.5 mg daily. 5. Gemfibrozil 300 mg daily. 6. Glipizide 10 mg b.i.d. 7. Insulin glargine 35 units at bedtime. 8. Lansoprazole 30 mg daily. 9. Metformin 1000 mg b.i.d. 10.Lopressor 25 mg b.i.d. 11.Pancrelipase 15,000 mg before every meal. 12.Paxil 10 mg daily. 13.Pravastatin 40 mg daily.  PAST MEDICAL HISTORY: 1. 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  This has     been complicated by pseudocyst in the past. 2. Hypertriglyceridemia. 3. Gastroesophageal reflux disease. 4. Gastroparesis. 5. Type 2 diabetes. 6. Prior noncardiac chest pain thought to be musculoskeletal. 7. Depression. 8. Erectile dysfunction. 9. History of cholecystectomy. 10.History of appendectomy. 11.Peptic ulcer disease with prior gastric ulcer. 12.Coronary artery disease.  The patient had bypass surgery in April     2006, last left heart catheterization was in September 2010 in the     setting of chest pain that showed a 70-80% diffuse proximal LAD     lesion, patent circumflex stent to 70%, proximal nondominant RCA     lesion.  The free radial artery to the left PDA  was patent and the     LIMA to the LAD was patent.  The EF was 55%.  SOCIAL HISTORY:  The patient lives in [place].  He is on disability because of pancreatitis.  He is married and does not smoke.  FAMILY HISTORY:  Mother had an MI at 24, brother had bypass surgery in his 18s.  He also had chronic pancreatitis.  REVIEW OF SYSTEMS:  All systems were reviewed and were negative except as noted in the history of present illness.  PHYSICAL EXAMINATION:  VITAL SIGNS:  The patient is afebrile, pulse is in the 50s, blood pressure is 195/82, oxygen saturation 100% on 2 liters nasal cannula. GENERAL:  This is a well-developed male in no apparent distress. HEENT:  Normal exam. ABDOMEN:  The patient's abdomen is soft.  There is mild epigastric tenderness.  There is no  hepatosplenomegaly. NECK:  There is no thyromegaly or thyroid nodule.  There is no JVD. CARDIOVASCULAR:  Heart regular, S1, S2.  No S3, no S4.  There is no murmur.  There are 2+ posterior tibial pulses bilaterally.  There is no edema.  There is no carotid bruit EXTREMITIES:  No clubbing or cyanosis. LUNGS:  Clear to auscultation bilaterally with normal respiratory effort. NEUROLOGIC:  Alert and oriented x3.  Normal affect. SKIN:  Normal exam.  RADIOLOGY:  Chest x-ray shows clear lungs.  EKG, normal sinus rhythm. There is an incomplete right bundle-branch block.  This is no change from prior EKG.  LABORATORY DATA:  White count 5.4, hematocrit 47.3, platelets 147,000. Creatinine 0.71, potassium 4.4.  First set of cardiac enzymes negative.  IMPRESSION:  This is a 50 year old with coronary artery disease, peptic ulcer disease, and chronic pancreatitis presented with epigastric pain radiating to his neck and his arms. 1. Epigastric/chest/arm pain.  The patient has a difficult symptom     complex as he has a number of prior problems that may have caused     this pain (chronic pancreatitis, history of peptic ulcer, history     of coronary artery disease).  The patient's epigastrium is tender.     His EKG is unchanged.  His cardiac enzymes are negative x2.  I     would actually lean towards a GI cause of his symptoms.  I will     check LFTs and amylase and lipase.  I am going to cycle his cardiac     enzymes with EKGs.  We will do fecal occult blood testing.  We will     start him on aspirin and continue his nitroglycerin drip and his     heparin drip.  We will continue aspirin and Plavix which he is     getting at home.  He is on nitroglycerin drip which I will continue     and he will be on a heparin drip for now.  The heparin drip can be     stopped if he rules out.  If the patient rules out for an     myocardial infarction, I would probably consider doing a stress     Myoview.  I will  also get a GI evaluation as I wonder if he needs     an EGD to assess for peptic ulcer disease and as above we are     getting pancreatic enzymes to look for flare of his pancreatitis.     I will keep him n.p.o. for now. 2. Hypertriglyceridemia.  We will check his lipids.  He  has only been     able to tolerate 300 mg daily of gemfibrozil. 3. Diabetes.  We will cover with insulin as well as n.p.o.     Marca Ancona, MD     DM/MEDQ  D:  03/11/2011  T:  03/11/2011  Job:  [id number]  cc:   Mila Homer. Sudie Bailey, M.D.  Electronically Signed by Marca Ancona MD on 03/11/2011 01:46:14 PM

## 2011-03-12 DIAGNOSIS — R079 Chest pain, unspecified: Secondary | ICD-10-CM

## 2011-03-12 LAB — GLUCOSE, CAPILLARY
Glucose-Capillary: 217 mg/dL — ABNORMAL HIGH (ref 70–99)
Glucose-Capillary: 159 mg/dL — ABNORMAL HIGH (ref 70–99)

## 2011-03-13 ENCOUNTER — Encounter: Payer: Self-pay | Admitting: Internal Medicine

## 2011-03-16 ENCOUNTER — Encounter: Payer: Self-pay | Admitting: Internal Medicine

## 2011-03-18 ENCOUNTER — Telehealth: Payer: Self-pay | Admitting: Internal Medicine

## 2011-03-18 NOTE — Telephone Encounter (Signed)
Spoke with patient and reviewed his pathology report with him. He states he is still having the "hurting pain" in upper chest. He took Diflucan x 4 days and is wondering if this was sufficient. He is taking Pantoprazole BID also. Please, advise.

## 2011-03-19 NOTE — Discharge Summary (Signed)
NAMEFISHEL, [name] NO.:  [id number]  MEDICAL RECORD NO.:  [id number]  LOCATION:  2607                         FACILITY:  [facility]  PHYSICIAN:  Rollene Rotunda, MD, FACCDATE OF BIRTH:  07-19-1961  DATE OF ADMISSION:  03/11/2011 DATE OF DISCHARGE:  03/12/2011                              DISCHARGE SUMMARY   PRIMARY CARDIOLOGIST:  Rollene Rotunda, MD, [facility].  PRIMARY CARE PHYSICIAN:  Mila Homer. Sudie Bailey, MD.  GASTROENTEROLOGIST:  Lionel December, MD in [place]  DISCHARGE DIAGNOSIS:  Candida esophagitis.  SECONDARY DIAGNOSES: 1. Chest pain with troponin elevation, some mild gastritis. 2. Coronary artery disease status post coronary artery bypass     grafting. 3. Chronic pancreatitis. 4. Peptic ulcer disease. 5. History of noncardiac chest pain. 6. Hypertriglyceridemia. 7. Gastroesophageal reflux disease. 8. Gastroparesis. 9. Type 2 diabetes mellitus. 10.Depression. 11.Erectile dysfunction. 12.Status post cystectomy. 13.Status post appendectomy.  ALLERGIES:  STATINS, REGLAN, IV CONTRAST, IODINE, SULFA, PENICILLIN.  PROCEDURES:  EGD performed, March 11, 2011, showing esophagitis throughout the esophagus with Candida esophagitis and irregular z-line. Pathology was sent to the lab.  There is mild gastritis as well as two circular antral erosions.  Otherwise, normal exam.  HISTORY OF PRESENT ILLNESS:  A 50 year old male with the above problem list, who was in his usual state of health until approximately 2 weeks prior to admission, began to experience intermittent epigastric and neck discomfort, radiating down to his arms, not related to exertion or to food.  Symptoms were sometimes relieved with Vicodin and ibuprofen.  On the day of admission, the patient was at home and had sudden onset of epigastric pain, radiating up to his chest and neck, and then down his arms bilaterally.  Symptoms were more severe than usual and he presented to the ED where he was  treated with hydrocodone and also nitroglycerin infusion and finally GI cocktail before he was pain-free.  His initial point-of-care markers were negative and ECG was nonacute.  He was admitted for further evaluation.  HOSPITAL COURSE:  Though the patient's first two sets of troponins were normal, his fourth and fifth sets were slightly elevated to a peak of 0.52.  His CKs and MBs were normal.  He continued to have intermittent epigastric pain and treated with GI cocktail and Gastroenterology was consulted.  Gastroenterology recommended EGD, was performed January 19, showing Candida esophagitis, gastritis, and antral erosions.  He recommended initiation of PPI as well as Diflucan x3 days.  Post EGD, the patient did have some recurrence of epigastric discomfort, relieved by GI cocktail.  His follow-up CK-MB remained within normal limits and therefore it was felt that troponin elevation is nonspecific and unlikely be related to ischemia.  Upon discharging the patient today, we will arrange for an outpatient exercise Myoview in the next week to further rule out ischemia.  DISCHARGE LABS:  Hemoglobin 16.0, hematocrit 44.6, WBC 4.6, platelets 149.  INR 0.91.  Sodium 136, potassium 3.9, chloride 100, CO2 28, BUN 14, creatinine 0.73, glucose 130, total bilirubin 0.5, alkaline phosphatase 29, AST 73, ALT 20, total protein 6.3, albumin 3.3, calcium 8.2, amylase 37, A1c 8.0, lipase 27, CK 41, MB 3.0, total cholesterol 399, triglycerides 1016, HDL 32,  TSH 6.711.  MRSA screen was negative.  DISPOSITION:  The patient will be discharged home today in good condition.  FOLLOWUP PLANS AND APPOINTMENTS:  We will arrange for an outpatient exercise Myoview followed by follow-up with Dr. Antoine Poche.  We have asked to follow up Dr. Karilyn Cota in [place] in the next 2-3 weeks for additional GI evaluation.  Follow up with his primary care provider as previously scheduled.  DISCHARGE MEDICATIONS: 1.  Fluconazole 100 mg daily. 2. Nitroglycerin 0.4 mg subcu p.r.n. chest pain. 3. Protonix 40 mg b.i.d. 4. Aspirin 81 mg daily. 5. Enalapril 2.5 mg daily. 6. Gemfibrozil 600 mg daily. 7. Glipizide 10 mg b.i.d. 8. Lantus 49 units at bedtime. 9. Lopressor 25 mg b.i.d. 10.Metformin 1000 mg b.i.d. 11.Pancrease 1500 mg t.i.d. with meals. 12.Paroxetine 10 mg daily. 13.Plavix 75 mg daily. 14.Pravachol 40 mg daily. 15.Valium 10 mg t.i.d. p.r.n.  OUTSTANDING LAB STUDIES:  Exercise Myoview is pending.  DURATION OF DISCHARGE ENCOUNTER:  40 minutes including physician time.     Nicolasa Ducking, ANP   ______________________________ Rollene Rotunda, MD, [facility]    CB/MEDQ  D:  03/12/2011  T:  03/13/2011  Job:  [id number]  cc:   Lionel December, M.D. Mila Homer. Sudie Bailey, M.D.  Electronically Signed by Nicolasa Ducking ANP on 03/15/2011 10:40:05 AM Electronically Signed by Rollene Rotunda MD [facility] on 03/19/2011 07:51:18 PM

## 2011-03-21 NOTE — Telephone Encounter (Signed)
Please refill Diflucan 100mg , #10, 1 po qd x 10 days, continue PPI bid. Needs an OV to see what else needs to be done.

## 2011-03-22 MED ORDER — FLUCONAZOLE 100 MG PO TABS: 100.0000 mg | ORAL_TABLET | Freq: Every day | ORAL | Status: DC

## 2011-03-22 NOTE — Telephone Encounter (Signed)
Follow up with Dr Karilyn Cota

## 2011-03-22 NOTE — Telephone Encounter (Signed)
Pt aware and rx sent to pharmacy. Pt states that his regular GI doctor is Dr. Karilyn Cota at [facility]. Pt wants to know if he should just follow-up with Dr. Karilyn Cota instead of making an appt with Dr. Juanda Chance. Please advise.

## 2011-03-23 NOTE — Telephone Encounter (Signed)
Message left for pt

## 2011-03-26 ENCOUNTER — Encounter (INDEPENDENT_AMBULATORY_CARE_PROVIDER_SITE_OTHER): Payer: Self-pay

## 2011-04-06 ENCOUNTER — Encounter (INDEPENDENT_AMBULATORY_CARE_PROVIDER_SITE_OTHER): Payer: Self-pay | Admitting: Internal Medicine

## 2011-04-06 ENCOUNTER — Ambulatory Visit (INDEPENDENT_AMBULATORY_CARE_PROVIDER_SITE_OTHER): Payer: Medicare Other | Admitting: Internal Medicine

## 2011-04-06 DIAGNOSIS — R1011 Right upper quadrant pain: Secondary | ICD-10-CM

## 2011-04-06 DIAGNOSIS — K219 Gastro-esophageal reflux disease without esophagitis: Secondary | ICD-10-CM

## 2011-04-06 DIAGNOSIS — R1012 Left upper quadrant pain: Secondary | ICD-10-CM

## 2011-04-06 DIAGNOSIS — R1032 Left lower quadrant pain: Secondary | ICD-10-CM

## 2011-04-06 MED ORDER — PREDNISONE 50 MG PO TABS: 50.0000 mg | ORAL_TABLET | Freq: Every day | ORAL | Status: AC

## 2011-04-06 MED ORDER — LANSOPRAZOLE 30 MG PO CPDR: 30.0000 mg | DELAYED_RELEASE_CAPSULE | Freq: Two times a day (BID) | ORAL | Status: DC

## 2011-04-06 NOTE — Progress Notes (Signed)
Presenting complaint; epigastric pain. 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 He began to experience a chest pain radiating to both his upper extremities in the first week of July. He was admitted to [facility] last month his pain was felt to be noncardiac. He has history of coronary artery disease with prior CABG in 2006. Was seen by Dr. Lina Sar and underwent EGD and found to have candidiasis hepatitis as well as gastritis H. pylori stains were negative. He was treated with Diflucan a close to 3 weeks his dysphagia and chest pain have improved now his pain has migrated into his upper abdomen. Some constant pain made worse with meals at times is burning; he also feels full in his abdomen. While he was in the hospital his triglyceride level was over 1000 and recently by Dr. Sudie Bailey was [phone]; his cholesterol level was 430; he has been intolerant of Niaspan as well as lopid recommended dose. He states he is not exercising since he's been sick. Is not lost any weight since his last visit. The last few days he is experienced more pain in the left upper quadrant which reminds him of pain that he had when he had acute pancreatitis. He has experienced some nausea and in and sporadic heaving but no emesis. His bowels are now regular; he had melena once but he took Pepto-Bismol. He denies rectal bleeding; also denies fever or chills. Current medication Current Outpatient Prescriptions on File Prior to Visit  Medication Sig Dispense Refill  . aspirin 81 MG tablet Take 81 mg by mouth daily.        . clopidogrel (PLAVIX) 75 MG tablet Take 75 mg by mouth daily.        . diazepam (VALIUM) 10 MG tablet Take 10 mg by mouth. As Needed      . enalapril (VASOTEC) 10 MG tablet Take 2.5 mg by mouth daily.       Marland Kitchen gemfibrozil (LOPID) 600 MG tablet  Take 300 mg by mouth daily.        Marland Kitchen glipiZIDE (GLUCOTROL) 10 MG tablet Take 10 mg by mouth 2 (two) times daily.        . hydrocodone-acetaminophen (LORCET PLUS) 7.5-650 MG per tablet Take 1 tablet by mouth every 6 (six) hours as needed.        . insulin glargine (LANTUS) 100 UNIT/ML injection Inject 50 Units into the skin at bedtime.       . metFORMIN (GLUCOPHAGE) 1000 MG tablet Take 1,000 mg by mouth 2 (two) times daily.        . metoprolol (LOPRESSOR) 100 MG tablet Take 25 mg by mouth daily.       . nitroGLYCERIN (NITROSTAT) 0.4 MG SL tablet Place 0.4 mg under the tongue every 5 (five) minutes as needed.        . NON FORMULARY Take 10 mg by mouth daily. Motilium , Takes 2-3 per week      . PANCRELIPASE, LIP-PROT-AMYL, 5000 UNITS CPEP Take 6,000 Units by mouth. Take 3 tablets before each meal and 2 tablets with a snack      . PARoxetine (PAXIL) 10 MG tablet Take 10 mg by mouth daily.        . pravastatin (PRAVACHOL) 40 MG tablet Take 40 mg by mouth at bedtime.        Marland Kitchen  promethazine (PHENERGAN) 25 MG tablet Take 25 mg by mouth as needed.        . naproxen sodium (ANAPROX) 220 MG tablet Take 220 mg by mouth as needed.         past medical history Hypertriglyceridemia Chronic pancreatitis secondary to above complicated by which gradually resolved with conservative therapy; last CT was in October 20 his pancreas was unremarkable. Coronary artery disease with CABG in 2006 History of chest wall pain Diabetes mellitus Chronic GERD Gastroparesis Hypertension History of peptic ulcer; status post therapy for H. pylori gastritis History of diverticulitis Other surgeries include appendectomy, cholecystectomy, nasal surgery for DNS Allergies; Reglan; Iodinated diagnostic agents; Iohexol; Penicillins; and Sulfonamide derivatives Please note that he has been able to tolerate IV dye with prednisone and Benadryl. Objective BP 118/80  Pulse 72  Temp(Src) 97.6 F (36.4 C) (Oral)  Ht 5\' 9"  (1.753 m)   Wt 170 lb (77.111 kg)  BMI 25.10 kg/m2 Conjunctiva is pink. Sclerae nonicteric. Oropharyngeal mucosa is normal. Neck without masses or thyromegaly. Cardiac exam with regular rhythm normal S1 and S2. No murmur gallop noted. Lungs clear to auscultation. No chest wall tenderness noted. Abdomen is symmetrical bowel sounds are normal. Soft abdomen with a moderate tenderness in midepigastrium and below the left costal margin. No organomegaly or masses noted. No peripheral edema or clubbing noted. Assessment Suspect patient's upper abdominal pain is secondary to relapse of his pancreatitis; his amylase and lipase during the recent hospitalization were normal these would not rule out pancreatitis in a patient with known hyperlipidemia. Recent triglyceride level was 1898 which is very worrisome. Recommendations Continue low-fat diet Will switch to lansoprazole 30 mg twice daily as pantoprazole is not working. Abdominopelvic CT with contrast. He will be premedicated with prednisone 50 mg x3 doses and Benadryl 50 mg by mouth. He would have to watch his glucose level with prednisone use.

## 2011-04-12 ENCOUNTER — Ambulatory Visit (HOSPITAL_COMMUNITY)
Admission: RE | Admit: 2011-04-12 | Discharge: 2011-04-12 | Disposition: A | Discharge status: Home / Self Care | Payer: Medicare Other | Source: Ambulatory Visit | Attending: Internal Medicine | Admitting: Internal Medicine

## 2011-04-12 DIAGNOSIS — R109 Unspecified abdominal pain: Secondary | ICD-10-CM | POA: Insufficient documentation

## 2011-04-12 DIAGNOSIS — E119 Type 2 diabetes mellitus without complications: Secondary | ICD-10-CM | POA: Insufficient documentation

## 2011-04-12 DIAGNOSIS — K5730 Diverticulosis of large intestine without perforation or abscess without bleeding: Secondary | ICD-10-CM | POA: Insufficient documentation

## 2011-04-12 MED ORDER — IOHEXOL 300 MG/ML  SOLN
100.0000 mL | Freq: Once | INTRAMUSCULAR | Status: AC | PRN
  Administered 2011-04-12: 100 mL via INTRAVENOUS

## 2011-04-15 ENCOUNTER — Telehealth (INDEPENDENT_AMBULATORY_CARE_PROVIDER_SITE_OTHER): Payer: Self-pay | Admitting: *Deleted

## 2011-04-15 NOTE — Telephone Encounter (Signed)
Message copied by Shona Needles on Thu Apr 15, 2011  9:22 AM ------      Message from: Malissa Hippo      Created: Wed Apr 14, 2011  6:39 PM       CT results reviewed with the patient.      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      No changes of acute pancreatitis or diverticulitis noted.      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      Plan.      Office visit in one month.      Should have fasting  triglyceride levels 2 days prior to office visit      Copy to pcp.

## 2011-04-15 NOTE — Telephone Encounter (Signed)
[name] , please make the OV and let me know so that I can do lab order.

## 2011-04-20 ENCOUNTER — Telehealth (INDEPENDENT_AMBULATORY_CARE_PROVIDER_SITE_OTHER): Payer: Self-pay | Admitting: *Deleted

## 2011-04-20 DIAGNOSIS — I2510 Atherosclerotic heart disease of native coronary artery without angina pectoris: Secondary | ICD-10-CM

## 2011-04-20 DIAGNOSIS — E785 Hyperlipidemia, unspecified: Secondary | ICD-10-CM

## 2011-04-20 DIAGNOSIS — E119 Type 2 diabetes mellitus without complications: Secondary | ICD-10-CM

## 2011-04-20 NOTE — Telephone Encounter (Signed)
Patient to have fasting Triglcerides 2 days prior to office visit

## 2011-04-27 ENCOUNTER — Encounter: Payer: Medicare Other | Admitting: Cardiology

## 2011-04-28 ENCOUNTER — Encounter (INDEPENDENT_AMBULATORY_CARE_PROVIDER_SITE_OTHER): Payer: Self-pay | Admitting: *Deleted

## 2011-04-30 ENCOUNTER — Telehealth (INDEPENDENT_AMBULATORY_CARE_PROVIDER_SITE_OTHER): Payer: Self-pay | Admitting: *Deleted

## 2011-04-30 NOTE — Telephone Encounter (Signed)
Referral & notes faxed to [facility], they will contact patient with appointment.

## 2011-04-30 NOTE — Telephone Encounter (Signed)
[name] , called the office and states that he feels that he needs to be seen or have a test done. He c /o nausea , pain in chest, arms. He feels like he may be ulcerated.  I spoke with Dr. Karilyn Cota on 04-29-11, he would like for the patient to have a Endoscopy Ultrasound. I called and left a message for the patient with this recommendation, forwarded to Luster Landsberg to arrange for the patient.

## 2011-05-11 ENCOUNTER — Ambulatory Visit (INDEPENDENT_AMBULATORY_CARE_PROVIDER_SITE_OTHER): Payer: Medicare Other | Admitting: Cardiology

## 2011-05-11 ENCOUNTER — Encounter: Payer: Self-pay | Admitting: Cardiology

## 2011-05-11 DIAGNOSIS — I2510 Atherosclerotic heart disease of native coronary artery without angina pectoris: Secondary | ICD-10-CM

## 2011-05-11 DIAGNOSIS — E785 Hyperlipidemia, unspecified: Secondary | ICD-10-CM

## 2011-05-11 NOTE — Assessment & Plan Note (Signed)
His triglycerides are greater than 1000.  I do not see a direct LDL.  For now he will continue the meds as listed.

## 2011-05-11 NOTE — Assessment & Plan Note (Signed)
At this point I do not think that he is having a change in his anginal pattern.  No further testing is indicated.  He will continue with meds as listed.

## 2011-05-11 NOTE — Patient Instructions (Addendum)
Follow up in 6 months with Dr Hochrein.  You will receive a letter in the mail 2 months before you are due.  Please call us when you receive this letter to schedule your follow up appointment.   The current medical regimen is effective;  continue present plan and medications.  

## 2011-05-11 NOTE — Progress Notes (Signed)
HPI The patient presents for followup of his known coronary disease. Since I last saw him he has had no new cardiovascular problems. He did have a catheterization in 2010 with results described below. He has continued to have some sternal chest discomfort but no anginal pain.  He is being treated for chronic pancreatitis.  He is to go to [facility] for an endoscopic ultrasound.  He remains somewhat inactive because of this pain.  Allergies  Allergen Reactions  . Reglan Hives    Patient became very spastic  . Iodinated Diagnostic Agents   . Iohexol      Desc: HIVES,SOB NEEDS PREP.MEDS   . Penicillins   . Sulfonamide Derivatives     Current Outpatient Prescriptions  Medication Sig Dispense Refill  . aspirin 81 MG tablet Take 81 mg by mouth daily.        . clopidogrel (PLAVIX) 75 MG tablet Take 34.5 mg by mouth daily.       . diazepam (VALIUM) 10 MG tablet Take 10 mg by mouth. As Needed      . enalapril (VASOTEC) 10 MG tablet Take 2.5 mg by mouth daily.       Marland Kitchen gemfibrozil (LOPID) 600 MG tablet Take 300 mg by mouth daily.        Marland Kitchen glipiZIDE (GLUCOTROL) 10 MG tablet Take 10 mg by mouth 2 (two) times daily.        . hydrocodone-acetaminophen (LORCET PLUS) 7.5-650 MG per tablet Take 1 tablet by mouth every 6 (six) hours as needed.        . insulin glargine (LANTUS) 100 UNIT/ML injection Inject 50 Units into the skin at bedtime.       . lansoprazole (PREVACID) 30 MG capsule Take 1 capsule (30 mg total) by mouth 2 (two) times daily.  30 capsule  5  . metFORMIN (GLUCOPHAGE) 1000 MG tablet Take 1,000 mg by mouth 2 (two) times daily.        . metoprolol (LOPRESSOR) 100 MG tablet Take 25 mg by mouth daily.       . naproxen sodium (ANAPROX) 220 MG tablet Take 220 mg by mouth as needed.        . nitroGLYCERIN (NITROSTAT) 0.4 MG SL tablet Place 0.4 mg under the tongue every 5 (five) minutes as needed.        . NON FORMULARY Take 10 mg by mouth daily. Motilium , Takes 2-3 per week      . PANCRELIPASE,  LIP-PROT-AMYL, 5000 UNITS CPEP Take 6,000 Units by mouth. Take 3 tablets before each meal and 2 tablets with a snack      . PARoxetine (PAXIL) 10 MG tablet Take 10 mg by mouth daily.        . pravastatin (PRAVACHOL) 40 MG tablet Take 40 mg by mouth at bedtime.        . promethazine (PHENERGAN) 25 MG tablet Take 25 mg by mouth as needed.          Past Medical History  Diagnosis Date  . Coronary artery disease     Patent Stent to Circ.  Patent coronary bypass grafts with a free radial graft toPDA and LIMA to the  diag.  . Diabetes mellitus   . Pancreatitis chronic     Pseudocyst  . Dyslipidemia     Poorly controlled, probably related to his DM (Some of this is secondary to his inability to afford medications).  . ED (erectile dysfunction)   . Bypass graft stenosis     '  06  . Metaplasia of esophagus   . Chronic gastritis   . Esophageal yeast infection     Past Surgical History  Procedure Date  . Appendectomy   . Cholecystectomy   . Knee surgery     left knee  . Nasal septum surgery   . Finger tendon repair     in middle finger  . Coronary artery bypass graft 11/24/2004  . Cardiac catheterization   . Esophagogastroduodenoscopy 09/19/01  . Colonoscopy 07/31/1985    ROS  PHYSICAL EXAM BP 122/82  Pulse 80  Ht 5\' 9"  (1.753 m)  Wt 165 lb (74.844 kg)  BMI 24.37 kg/m2 GENERAL:  Well appearing HEENT:  Pupils equal round and reactive, fundi not visualized, oral mucosa unremarkable NECK:  No jugular venous distention, waveform within normal limits, carotid upstroke brisk and symmetric, no bruits, no thyromegaly LYMPHATICS:  No cervical, inguinal adenopathy LUNGS:  Clear to auscultation bilaterally BACK:  No CVA tenderness CHEST:  Well healed sternotomy scar. HEART:  PMI not displaced or sustained,S1 and S2 within normal limits, no S3, no S4, no clicks, no rubs, no murmurs ABD:  Flat, positive bowel sounds normal in frequency in pitch, no bruits, no rebound, no guarding, no  midline pulsatile mass, no hepatomegaly, no splenomegaly EXT:  2 plus pulses throughout, no edema, no cyanosis no clubbing SKIN:  No rashes no nodules NEURO:  Cranial nerves II through XII grossly intact, motor grossly intact throughout PSYCH:  Cognitively intact, oriented to person place and time   EKG:  Normal sinus rhythm, rate 80, incomplete right bundle branch block, no acute ST-T wave changes  ASSESSMENT AND PLAN

## 2011-05-14 ENCOUNTER — Other Ambulatory Visit (INDEPENDENT_AMBULATORY_CARE_PROVIDER_SITE_OTHER): Payer: Self-pay | Admitting: Internal Medicine

## 2011-05-18 ENCOUNTER — Ambulatory Visit (INDEPENDENT_AMBULATORY_CARE_PROVIDER_SITE_OTHER): Payer: Medicare Other | Admitting: Internal Medicine

## 2011-05-18 NOTE — Progress Notes (Signed)
Addended by: Burnett Kanaris A on: 05/18/2011 01:22 PM   Modules accepted: Orders

## 2011-05-19 ENCOUNTER — Telehealth (INDEPENDENT_AMBULATORY_CARE_PROVIDER_SITE_OTHER): Payer: Self-pay | Admitting: *Deleted

## 2011-05-19 DIAGNOSIS — E782 Mixed hyperlipidemia: Secondary | ICD-10-CM

## 2011-05-19 NOTE — Progress Notes (Signed)
Lab noted for October 27 prior to patient's office appointment on 06-21-11. Recent lab faxed to PCP.

## 2011-05-19 NOTE — Telephone Encounter (Signed)
Per Dr. Karilyn Cota the patient will need repeat Triglycerides prior to his office visit 06-21-11. The order will be faxed to [facility] beginning of October. Recent lab results will be faxed to patient's PCP.

## 2011-05-19 NOTE — Progress Notes (Signed)
Lab noted for 06-19-11 prior to the patient 's office visit 06-21-11. Recent lab results faxed to patient's PCP.

## 2011-05-27 ENCOUNTER — Encounter (INDEPENDENT_AMBULATORY_CARE_PROVIDER_SITE_OTHER): Payer: Self-pay | Admitting: *Deleted

## 2011-06-04 LAB — POCT CARDIAC MARKERS
CKMB, poc: <1 — ABNORMAL LOW
Operator id: 216461
Operator id: 216461
Troponin i, poc: <0.05
Troponin i, poc: <0.05

## 2011-06-04 LAB — DIFFERENTIAL
Basophils Absolute: 0
Eosinophils Absolute: 0.3
Eosinophils Relative: 5

## 2011-06-04 LAB — COMPREHENSIVE METABOLIC PANEL
ALT: 23
AST: 26
Albumin: 3.6
Alkaline Phosphatase: 41
BUN: 18
CO2: 22
Chloride: 106
Creatinine, Ser: 1.01
GFR calc Af Amer: >60
GFR calc non Af Amer: >60
Glucose, Bld: 256 — ABNORMAL HIGH
Potassium: 3.8
Potassium: 4.5
Sodium: 136
Total Bilirubin: 1.5 — ABNORMAL HIGH
Total Protein: 6.3

## 2011-06-04 LAB — CARDIAC PANEL(CRET KIN+CKTOT+MB+TROPI)
CK, MB: 0.9
CK, MB: 0.9
Relative Index: 0.5
Total CK: 174
Troponin I: 0.01

## 2011-06-04 LAB — BASIC METABOLIC PANEL
BUN: 16
CO2: 30
Calcium: 8.4
Creatinine, Ser: 0.97
GFR calc non Af Amer: >60
Glucose, Bld: 115 — ABNORMAL HIGH

## 2011-06-04 LAB — CBC
RBC: 6.18 — ABNORMAL HIGH
WBC: 6.4

## 2011-06-04 LAB — PROTIME-INR: INR: 1.1

## 2011-06-04 LAB — APTT
aPTT: 34
aPTT: >200

## 2011-06-04 LAB — HEMOGLOBIN A1C
Hgb A1c MFr Bld: 7.8 — ABNORMAL HIGH
Mean Plasma Glucose: 200

## 2011-06-04 LAB — CK TOTAL AND CKMB (NOT AT ARMC): Relative Index: INVALID

## 2011-06-04 LAB — LIPID PANEL
Cholesterol: 163
HDL: 29 — ABNORMAL LOW

## 2011-06-14 DIAGNOSIS — I10 Essential (primary) hypertension: Secondary | ICD-10-CM | POA: Insufficient documentation

## 2011-06-14 DIAGNOSIS — E785 Hyperlipidemia, unspecified: Secondary | ICD-10-CM | POA: Insufficient documentation

## 2011-06-14 DIAGNOSIS — F419 Anxiety disorder, unspecified: Secondary | ICD-10-CM | POA: Insufficient documentation

## 2011-06-14 DIAGNOSIS — Z8719 Personal history of other diseases of the digestive system: Secondary | ICD-10-CM | POA: Insufficient documentation

## 2011-06-14 DIAGNOSIS — E782 Mixed hyperlipidemia: Secondary | ICD-10-CM | POA: Insufficient documentation

## 2011-06-18 ENCOUNTER — Other Ambulatory Visit (INDEPENDENT_AMBULATORY_CARE_PROVIDER_SITE_OTHER): Payer: Self-pay | Admitting: Internal Medicine

## 2011-06-19 LAB — TRIGLYCERIDES: Triglycerides: 2109 mg/dL — ABNORMAL HIGH (ref <150)

## 2011-06-21 ENCOUNTER — Encounter (INDEPENDENT_AMBULATORY_CARE_PROVIDER_SITE_OTHER): Payer: Self-pay | Admitting: Internal Medicine

## 2011-06-21 ENCOUNTER — Ambulatory Visit (INDEPENDENT_AMBULATORY_CARE_PROVIDER_SITE_OTHER): Payer: Medicare Other | Admitting: Internal Medicine

## 2011-06-21 VITALS — BP 140/92 | HR 74 | Temp 97.7°F | Resp 14 | Ht 69.0 in | Wt 172.0 lb

## 2011-06-21 DIAGNOSIS — E781 Pure hyperglyceridemia: Secondary | ICD-10-CM

## 2011-06-21 NOTE — Patient Instructions (Addendum)
Triglyceride level to be repeated on 07/14/2011. Must walk 2 miles a day at least 4 times a week. If possible increase  gemfibrozil dose to 600 mg daily. Limited meat intake to fish and chicken and avoid red meats Physician will contact you with results of EUS when available

## 2011-06-21 NOTE — Progress Notes (Signed)
Presenting complaint; followup for chronic pancreatitis. Subjective. 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 He also has history of pseudocyst but these resolved gradually. He presented on April 06 2011 with epigastric pain that felt to be secondary to relapse of his pancreatitis. His triglyceride was at around 1800. He had abdominopelvic CT on 04/15/2011. He had small fluid collection in the tail of pancreas with minimal wall calcification and unchanged from previous study of 2010. He did not have any evidence of pancreatitis. He had sigmoid and descending colonic diverticulosis with wall thickening and sigmoid colon unchanged from previous exam of 2010 without changes of diverticulitis. Since he was still having pain he was sent over to [facility] for a EUS which he had last week and was told that he had 3 very small aneurysms. I have not yet seen the report. He feels much better. He denies abdominal pain. His weight is up by 2 pounds since his last visit. He states he has been exercising regularly. He is doing more of martial arts been brisk walking. He still has intermittent chest pain which is felt to be musculoskeletal in has been thoroughly evaluated by his physicians in the past. He denies diarrhea melena or rectal bleeding. Current medications Current Outpatient Prescriptions on File Prior to Visit  Medication Sig Dispense Refill  . aspirin 81 MG tablet Take 81 mg by mouth daily.        . clopidogrel (PLAVIX) 75 MG tablet Take 75 mg by mouth daily.       . diazepam (VALIUM) 10 MG tablet Take 10 mg by mouth. As Needed      . enalapril (VASOTEC) 10 MG tablet Take 2.5 mg by mouth daily.       Marland Kitchen gemfibrozil (LOPID) 600 MG tablet Take 300 mg by mouth daily.        Marland Kitchen glipiZIDE (GLUCOTROL) 10 MG tablet Take 10 mg by mouth 2 (two) times daily.        . hydrocodone-acetaminophen (LORCET PLUS) 7.5-650 MG per tablet  Take 1 tablet by mouth every 6 (six) hours as needed.        . insulin glargine (LANTUS) 100 UNIT/ML injection Inject 50 Units into the skin at bedtime.       . lansoprazole (PREVACID) 30 MG capsule Take 1 capsule (30 mg total) by mouth 2 (two) times daily.  30 capsule  5  . metFORMIN (GLUCOPHAGE) 1000 MG tablet Take 1,000 mg by mouth 2 (two) times daily.        . metoprolol (LOPRESSOR) 100 MG tablet Take 25 mg by mouth daily.       . naproxen sodium (ANAPROX) 220 MG tablet Take 220 mg by mouth as needed.        . nitroGLYCERIN (NITROSTAT) 0.4 MG SL tablet Place 0.4 mg under the tongue every 5 (five) minutes as needed.        . NON FORMULARY Take 10 mg by mouth daily. Motilium , Take 3 per week      . PANCRELIPASE, LIP-PROT-AMYL, 5000 UNITS CPEP Take 6,000 Units by mouth. Take 3 tablets before each meal and 2 tablets with a snack      . PARoxetine (PAXIL) 10 MG tablet Take 10 mg by mouth daily.        . pravastatin (PRAVACHOL) 40 MG tablet Take 40 mg by mouth at bedtime.        . promethazine (PHENERGAN) 25 MG tablet Take  25 mg by mouth as needed.         Objective BP 140/92  Pulse 74  Temp(Src) 97.7 F (36.5 C) (Oral)  Resp 14  Ht 5\' 9"  (1.753 m)  Wt 78.019 kg (172 lb)  BMI 25.40 kg/m2 Conjunctiva is pink. Sclerae nonicteric. Neck masses or thyromegaly noted. Abdomen is symmetrical soft and nontender without organomegaly or masses. No LE edema noted. Lab data Triglyceride level from 06/18/2011 is 2109.  Assessment #1. 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 It does not appear that he has acute pancreatitis or any suspicious areas on recent EUS. I will be able to provide patient more accurate details once I receive the report. #2. Hypertriglyceridemia. Serum triglyceride is going up. He needs to quit eating red meat. He needs to walk up least 2 miles a day. If possible he should increase his gemfibrozil to 600 mg daily or 300 mg twice daily. Plan Increase  gemfibrozil to 600 mg daily. Must walk 2 miles a day least 4 times a week. I will contact patient with results of EUS when available. Repeat triglyceride level on 07/14/2011. Office visit in 6 months.

## 2011-08-08 ENCOUNTER — Telehealth (INDEPENDENT_AMBULATORY_CARE_PROVIDER_SITE_OTHER): Payer: Self-pay | Admitting: Internal Medicine

## 2011-08-08 NOTE — Telephone Encounter (Signed)
I called at 10 to go over the results of the ultrasound with him this study was done at [facility] on 06/17/2011 we did not get a report until patient called [place] with the results . Surprisingly no abnormality noted 2 pancreas. CT scan from August 2012 showed a cyst in the tail of pancreas which is now gone. Patient is scheduled to have triglyceride levels which se will have at  Dr. Sudie Bailey office.

## 2011-08-12 ENCOUNTER — Encounter (INDEPENDENT_AMBULATORY_CARE_PROVIDER_SITE_OTHER): Payer: Self-pay | Admitting: *Deleted

## 2011-09-13 ENCOUNTER — Ambulatory Visit (INDEPENDENT_AMBULATORY_CARE_PROVIDER_SITE_OTHER): Payer: Medicare Other | Admitting: Internal Medicine

## 2011-09-13 ENCOUNTER — Encounter (INDEPENDENT_AMBULATORY_CARE_PROVIDER_SITE_OTHER): Payer: Self-pay | Admitting: Internal Medicine

## 2011-09-13 VITALS — BP 118/80 | HR 78 | Temp 97.8°F | Resp 14 | Ht 69.0 in | Wt 160.0 lb

## 2011-09-13 DIAGNOSIS — Z8719 Personal history of other diseases of the digestive system: Secondary | ICD-10-CM

## 2011-09-13 DIAGNOSIS — E785 Hyperlipidemia, unspecified: Secondary | ICD-10-CM

## 2011-09-13 DIAGNOSIS — R634 Abnormal weight loss: Secondary | ICD-10-CM

## 2011-09-13 DIAGNOSIS — K219 Gastro-esophageal reflux disease without esophagitis: Secondary | ICD-10-CM

## 2011-09-13 MED ORDER — PANTOPRAZOLE SODIUM 40 MG PO TBEC: 40.0000 mg | DELAYED_RELEASE_TABLET | Freq: Every day | ORAL | Status: DC

## 2011-09-13 NOTE — Progress Notes (Signed)
Presenting complaint; Followup for chronic pancreatitis and hypertriglyceridemia. Subjective: [name] is 51 year old Caucasian male patient of Dr. Brett Canales Knowlton's who is here for scheduled visit. He has history of pancreatitis complicated by pseudocyst formation. He developed epigastric pain back in June July and August and his triglyceride level was around 2000 was felt she may have suffered mild episode of pancreatitis. Abdominopelvic CT on 04/15/2011 revealed small collection of fluid in tail of pancreas and minimal calcification unchanged from previous study of 2010. He kept having intermittent pain in left upper quadrant. He had EUS at [facility] on 06/17/2011 which surprisingly revealed no abnormalities. His triglyceride level II months ago was 2109 mg/dL. gemfibrozil dose was increased to twice a day until recently when he developed muscle cramps and the dose was used back to once a day. [facility] [place] [facility] [employer] [facility] 3 times a week. He is also change his eating habits and cut meat altogether. He has had very little pain in left upper quadrant only when he is stressed out. He also is taking lansoprazole only on when necessary basis because of cost. He has lost 10 pounds since his last visit. He denies fever chills or night sweats.  Current Medications: Current Outpatient Prescriptions  Medication Sig Dispense Refill  . clopidogrel (PLAVIX) 75 MG tablet Take 75 mg by mouth daily.       . diazepam (VALIUM) 10 MG tablet Take 10 mg by mouth. As Needed      . enalapril (VASOTEC) 10 MG tablet Take 2.5 mg by mouth daily.       Marland Kitchen gemfibrozil (LOPID) 600 MG tablet Take 300 mg by mouth daily.        Marland Kitchen glipiZIDE (GLUCOTROL) 10 MG tablet Take 5 mg by mouth 2 (two) times daily.       . hydrocodone-acetaminophen (LORCET PLUS) 7.5-650 MG per tablet Take 1 tablet by mouth every 6 (six) hours as needed.        . insulin glargine (LANTUS) 100 UNIT/ML injection Inject 35 Units into the  skin at bedtime.       Marland Kitchen levofloxacin (LEVAQUIN) 500 MG tablet 500 mg. Take 1/2 tablet daily until completed      . metFORMIN (GLUCOPHAGE) 1000 MG tablet Take 750 mg by mouth daily.       . metoprolol (LOPRESSOR) 100 MG tablet Take 25 mg by mouth daily.       . nitroGLYCERIN (NITROSTAT) 0.4 MG SL tablet Place 0.4 mg under the tongue every 5 (five) minutes as needed.        Marland Kitchen PARoxetine (PAXIL) 10 MG tablet Take 10 mg by mouth daily.        . pravastatin (PRAVACHOL) 40 MG tablet Take 40 mg by mouth at bedtime.        . promethazine (PHENERGAN) 25 MG tablet Take 25 mg by mouth as needed.        . pantoprazole (PROTONIX) 40 MG tablet Take 1 tablet (40 mg total) by mouth daily.  30 tablet  11    Objective: Blood pressure 118/80, pulse 78, temperature 97.8 F (36.6 C), temperature source Oral, resp. rate 14, height 5\' 9"  (1.753 m), weight 160 lb (72.576 kg). Conjunctiva is pink. Sclera is nonicteric Oral pharyngeal mucosa is normal. No neck masses or thyromegaly noted. Cardiac exam with regular rhythm normal S1 and S2. No murmur or gallop noted. Lungs are clear to auscultation. Abdomen abdomen is flat. Bowel sounds are normal. No bruits  noted. It is soft and nontender without organomegaly or masses No LE edema or clubbing noted. Labs/studies Results: EUS performed at [facility] on 06/17/2011 was a normal study. No evidence of chronic pancreatitis or pseudocyst. Lab data from Dr. Sudie Bailey office from 08/31/2011 cholesterol 224,triglycerides 219, LDL 134 and HDL 46. H&H is 18.5 and 55.3,[id number].   Assessment: #1. Hypertriglyceridemia. His level is finally close to normal. His triglyceride level has not been this low for several years. This appears to be result of dietary changes exercise and higher dose of gemfibrozil.  #2. History of recurrent pancreatitis secondary to elevated triglyceride levels. He is now asymptomatic and recent EUS did not show any changes of chronic pancreatitis or  pseudocyst. #3. Weight loss; a believe this is secondary to lifestyle changes that need to make sure that it has leveled off. #4. History of GERD/gastroparesis. He should continue PPI on daily basis. #5. H&H is at upper limit of normal which be unusual for non-smoker.     Plan: Patient will stop by at office for weight check in 2 and 4 weeks. Discontinue lansoprazole. Start pantoprazole 40 mg by mouth every morning. Office visit in one year unless weight loss continues in which case he would be seen earlier. Repeat CBC in 3 months by Dr. Sudie Bailey

## 2011-09-13 NOTE — Patient Instructions (Addendum)
Please stop by for weight check in 2 and 4 weeks. Have Dr. Sudie Bailey repeat her CBC in 3 months since your hemoglobin is at upper limit of normal

## 2011-11-08 ENCOUNTER — Encounter: Payer: Self-pay | Admitting: Cardiology

## 2011-11-08 ENCOUNTER — Ambulatory Visit (INDEPENDENT_AMBULATORY_CARE_PROVIDER_SITE_OTHER): Payer: Medicare Other | Admitting: Cardiology

## 2011-11-08 VITALS — BP 170/86 | HR 78 | Ht 69.0 in | Wt 161.0 lb

## 2011-11-08 DIAGNOSIS — I2510 Atherosclerotic heart disease of native coronary artery without angina pectoris: Secondary | ICD-10-CM

## 2011-11-08 DIAGNOSIS — I10 Essential (primary) hypertension: Secondary | ICD-10-CM

## 2011-11-08 DIAGNOSIS — E785 Hyperlipidemia, unspecified: Secondary | ICD-10-CM

## 2011-11-08 NOTE — Assessment & Plan Note (Addendum)
In January his total cholesterol is 224, HDL 46, LDL 134 with triglycerides of 219.  Though still elevated this is much better than previous.  He will continue the meds as listed.

## 2011-11-08 NOTE — Assessment & Plan Note (Addendum)
The patient has no new sypmtoms.  No further cardiovascular testing is indicated.  We will continue with aggressive risk reduction and meds as listed.  

## 2011-11-08 NOTE — Progress Notes (Signed)
HPI The patient presents for followup of his known coronary disease.  Since I last saw him he has done well.  He rarely chest discomfort. He works out at [employer] 3-4 times per week. He does not bring on chest pressure, neck or arm discomfort. He's not having any new shortness of breath, PND or orthopnea. He's had no palpitations, presyncope or syncope.  Allergies  Allergen Reactions  . Reglan Hives    Patient became very spastic  . Iodinated Diagnostic Agents   . Iohexol      Desc: HIVES,SOB NEEDS PREP.MEDS   . Penicillins   . Sulfonamide Derivatives     Current Outpatient Prescriptions  Medication Sig Dispense Refill  . clopidogrel (PLAVIX) 75 MG tablet Take 75 mg by mouth daily.       . diazepam (VALIUM) 10 MG tablet Take 10 mg by mouth. As Needed      . enalapril (VASOTEC) 10 MG tablet Take 2.5 mg by mouth daily.       Marland Kitchen gemfibrozil (LOPID) 600 MG tablet Take 300 mg by mouth daily.        Marland Kitchen glipiZIDE (GLUCOTROL) 10 MG tablet Take 5 mg by mouth 2 (two) times daily.       . hydrocodone-acetaminophen (LORCET PLUS) 7.5-650 MG per tablet Take 1 tablet by mouth every 6 (six) hours as needed.        . insulin glargine (LANTUS) 100 UNIT/ML injection Inject 35 Units into the skin at bedtime.       . metFORMIN (GLUCOPHAGE) 1000 MG tablet Take 500 mg by mouth daily.       . metoprolol (LOPRESSOR) 100 MG tablet Take 25 mg by mouth 2 (two) times daily.       . nitroGLYCERIN (NITROSTAT) 0.4 MG SL tablet Place 0.4 mg under the tongue every 5 (five) minutes as needed.        . pantoprazole (PROTONIX) 40 MG tablet Take 1 tablet (40 mg total) by mouth daily.  30 tablet  11  . PARoxetine (PAXIL) 10 MG tablet Take 10 mg by mouth daily.        . pravastatin (PRAVACHOL) 40 MG tablet Take 40 mg by mouth at bedtime.        . promethazine (PHENERGAN) 25 MG tablet Take 25 mg by mouth as needed.          Past Medical History  Diagnosis Date  . Coronary artery disease     Patent Stent to Circ.   Patent coronary bypass grafts with a free radial graft toPDA and LIMA to the  diag.  . Diabetes mellitus   . Pancreatitis chronic     Pseudocyst  . Dyslipidemia     Poorly controlled, probably related to his DM (Some of this is secondary to his inability to afford medications).  . ED (erectile dysfunction)   . 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 Metaplasia of esophagus   . Chronic gastritis   . Esophageal yeast infection     Past Surgical History  Procedure Date  . Appendectomy   . Cholecystectomy   . Knee surgery     left knee  . Nasal septum surgery   . Finger tendon repair     in middle finger  . Coronary artery bypass graft 11/24/2004  . Cardiac catheterization   . Esophagogastroduodenoscopy 09/19/01  . Colonoscopy 07/31/1985    ROS  As stated in the HPI and negative for all other systems.  PHYSICAL EXAM BP 170/86  Pulse 78  Ht 5\' 9"  (1.753 m)  Wt 161 lb (73.029 kg)  BMI 23.78 kg/m2 GENERAL:  Well appearing HEENT:  Pupils equal round and reactive, fundi not visualized, oral mucosa unremarkable NECK:  No jugular venous distention, waveform within normal limits, carotid upstroke brisk and symmetric, no bruits, no thyromegaly LYMPHATICS:  No cervical, inguinal adenopathy LUNGS:  Clear to auscultation bilaterally BACK:  No CVA tenderness CHEST:  Well healed sternotomy scar. HEART:  PMI not displaced or sustained,S1 and S2 within normal limits, no S3, no S4, no clicks, no rubs, no murmurs ABD:  Flat, positive bowel sounds normal in frequency in pitch, no bruits, no rebound, no guarding, no midline pulsatile mass, no hepatomegaly, no splenomegaly EXT:  2 plus pulses throughout, no edema, no cyanosis no clubbing  EKG:  Normal sinus rhythm, rate 78, incomplete right bundle branch block, no acute ST-T wave changes.  11/08/2011   ASSESSMENT AND PLAN

## 2011-11-08 NOTE — Patient Instructions (Signed)
The current medical regimen is effective;  continue present plan and medications.  Follow up in 1 year with Dr Hochrein.  You will receive a letter in the mail 2 months before you are due.  Please call us when you receive this letter to schedule your follow up appointment.  

## 2011-11-08 NOTE — Assessment & Plan Note (Signed)
His blood pressure is elevated today but a repeat demonstrated the systolic to be down to 142. No change in therapy is indicated. He will keep a blood pressure diary.

## 2011-11-25 ENCOUNTER — Encounter (INDEPENDENT_AMBULATORY_CARE_PROVIDER_SITE_OTHER): Payer: Self-pay | Admitting: *Deleted

## 2011-12-02 IMAGING — CT CT ABD-PELV W/ CM
2 of 4 series · 15 of 46 positions shown, 17 images · IV contrast (Omnipaque 300)
Comparison: 04/10/2009

CLINICAL DATA: Abdominal pain, history pancreatitis, appendectomy,
diabetes, coronary artery disease post bypass

CT ABDOMEN AND PELVIS WITH CONTRAST
TECHNIQUE: Multidetector CT imaging of the abdomen and pelvis was
performed following the standard protocol during bolus
administration of intravenous contrast. Due to history of contrast
allergy, the patient was premedicated with a standard regimen of
oral prednisone and Benadryl.  No immediate complication.  Sagittal
and coronal MPR images reconstructed from axial data set.
Contrast: Dilute oral contrast. 100 ml Omnipaque 300 IV.

[Series 2: abd_pel_with 5.0 b40f · axial · 0.73mm/px · z∈[-786,-340]mm · 12 of 99 slices shown, 14 images]
[im 5/99  soft-tissue]
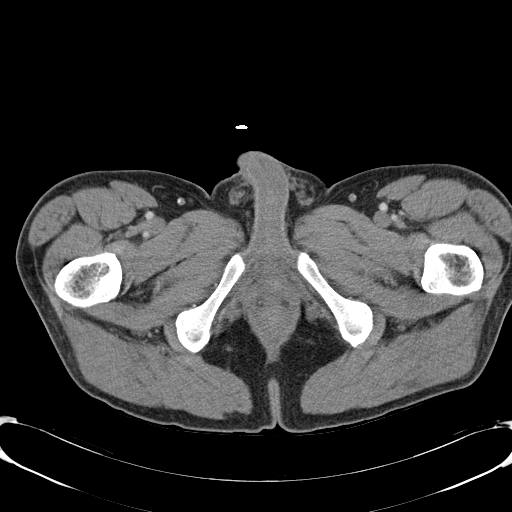
[im 5/99  bone]
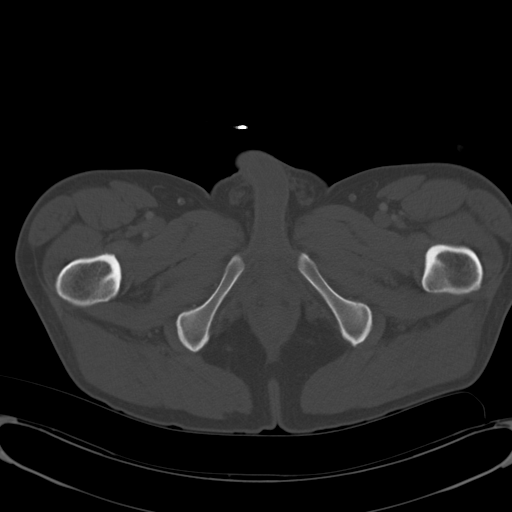
[im 15/99  soft-tissue]
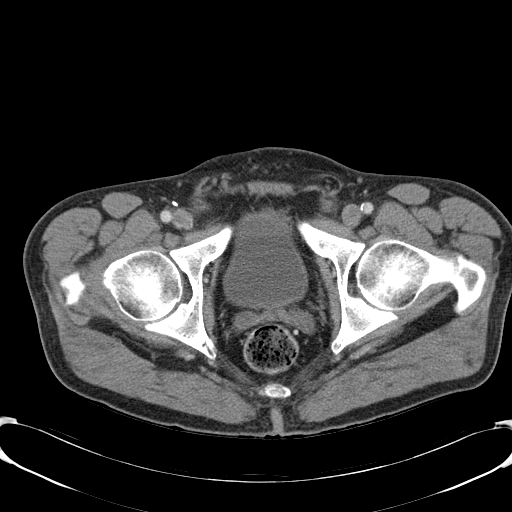
[im 24/99  soft-tissue]
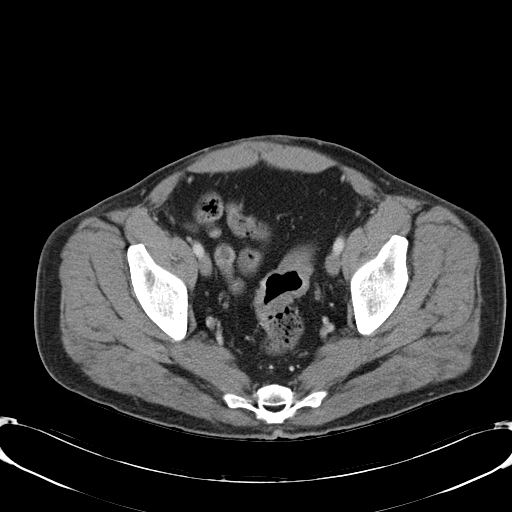
[im 29/99  soft-tissue]
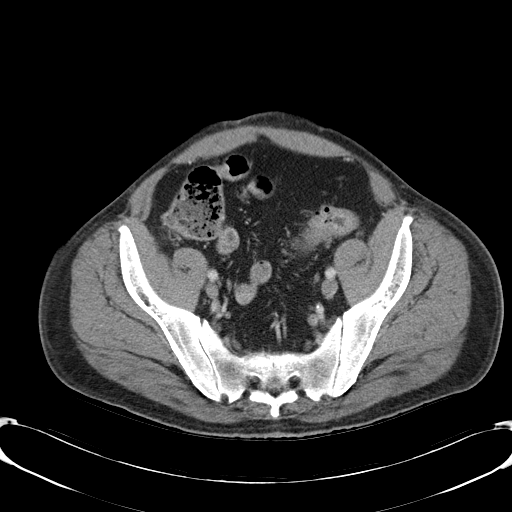
[im 38/99  soft-tissue]
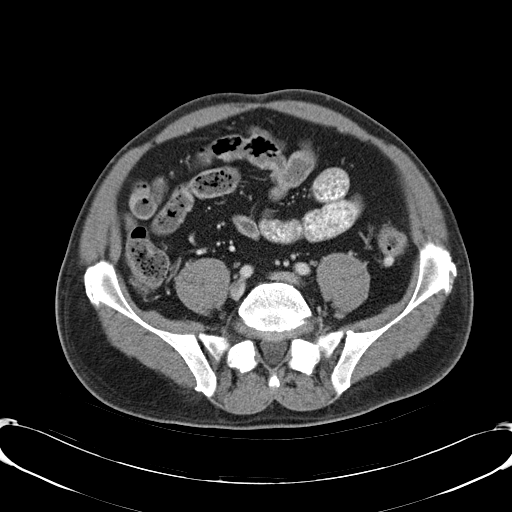
[im 47/99  soft-tissue]
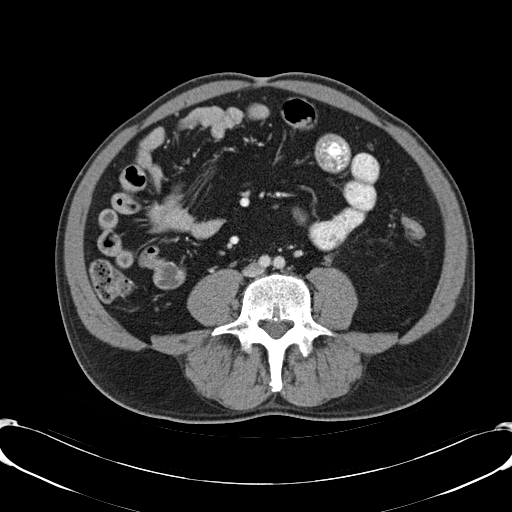
[im 52/99  soft-tissue]
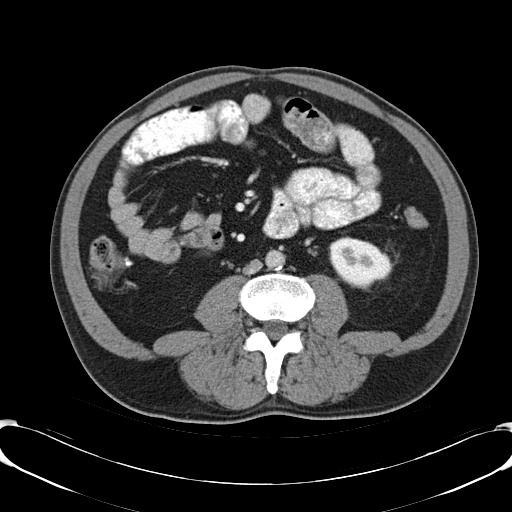
[im 61/99  soft-tissue]
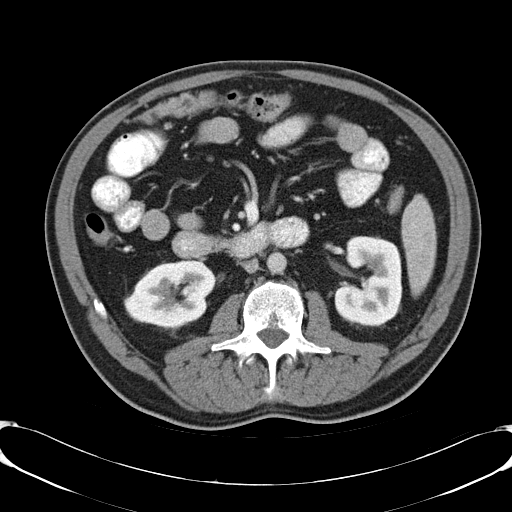
[im 71/99  soft-tissue]
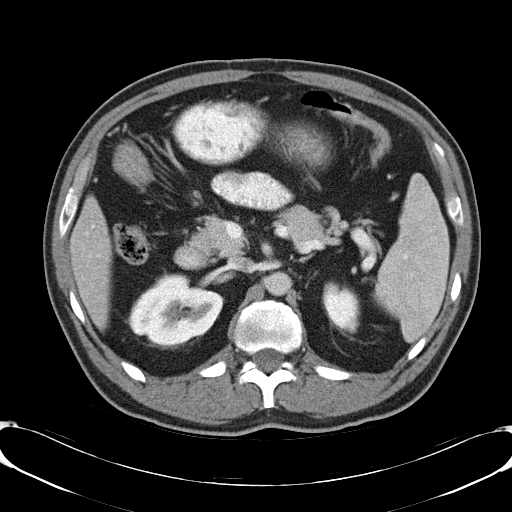
[im 71/99  bone]
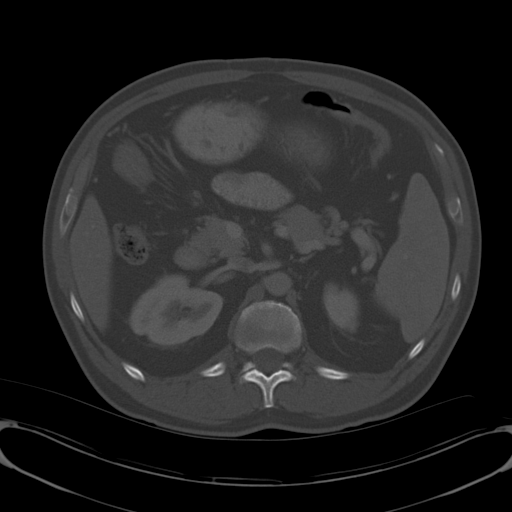
[im 75/99  soft-tissue]
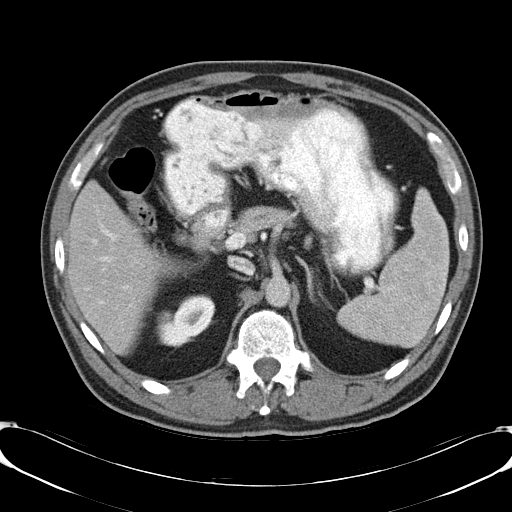
[im 85/99  soft-tissue]
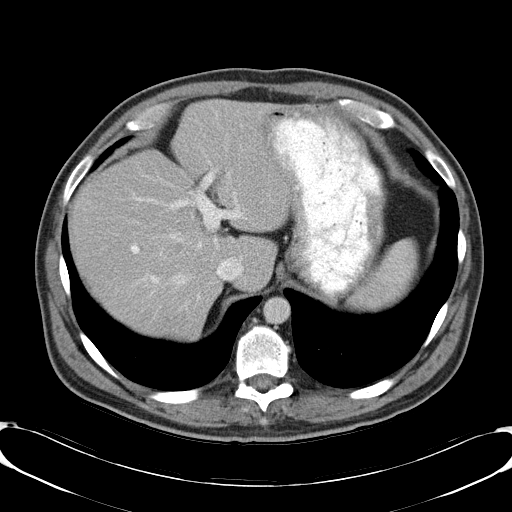
[im 94/99  soft-tissue]
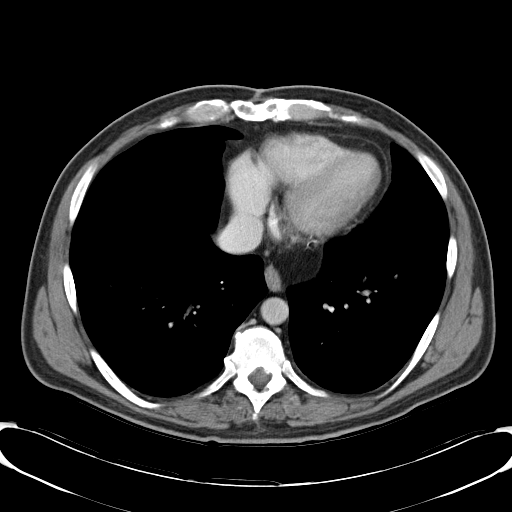

[Series 4: abd_pel_with 3.0 spo cor · coronal · 0.67mm/px · 3 of 96 slices shown]
[im 32/96  soft-tissue]
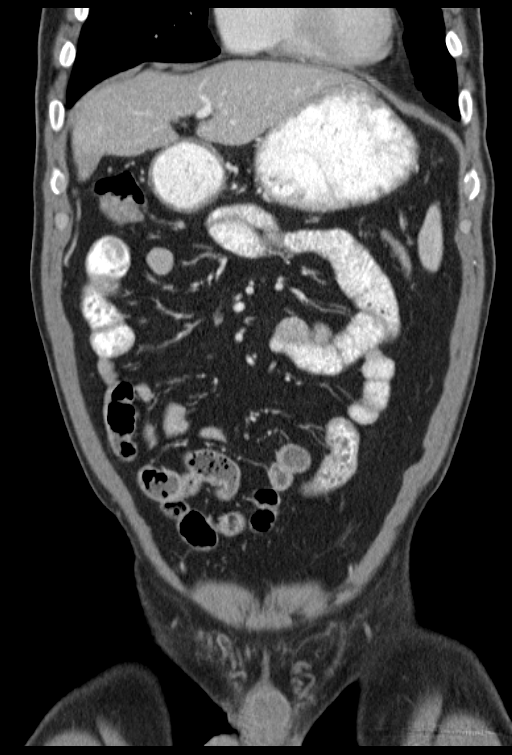
[im 43/96  soft-tissue]
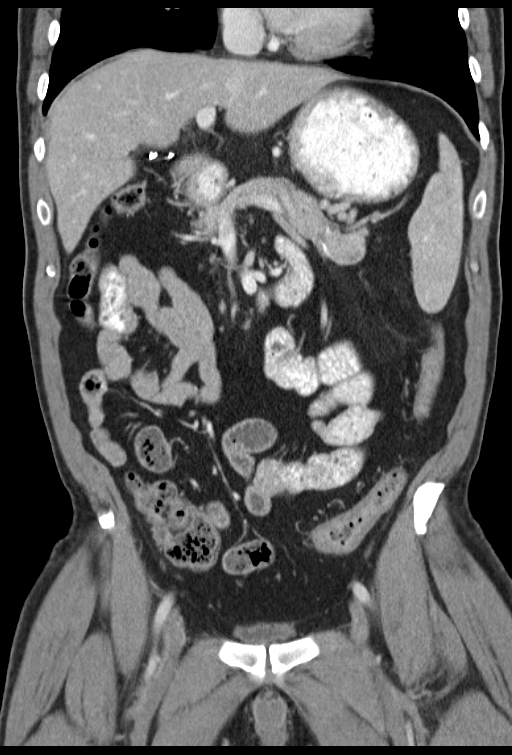
[im 53/96  soft-tissue]
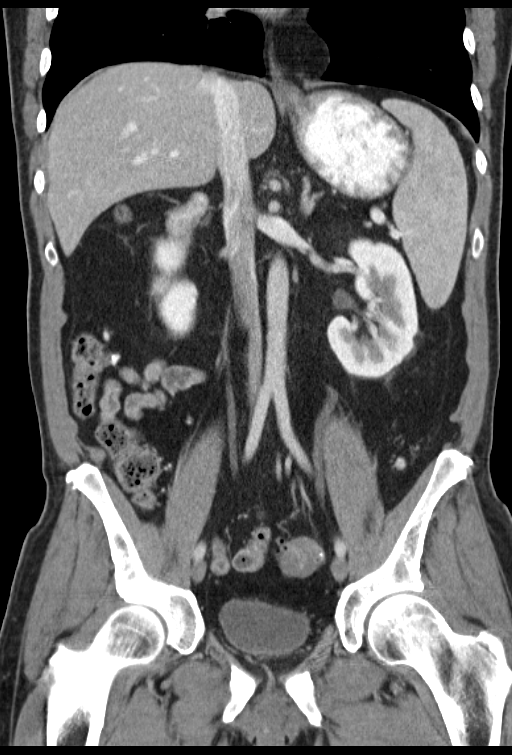

[15 of 46 positions shown; findings below may reference images not displayed]

FINDINGS: Lung bases clear.
Question small lipoma at inferior mediastinum versus para
esophageal herniation of properitoneal fat, 3.9 x 3.2 cm image 8.
Partially calcified nodule within this fat collection, 11 x 6 mm,
unchanged since previous exam, question small partially calcified
lymph node versus calcified area of fat necrosis.
Focal fatty infiltration of liver adjacent the falciform fissure.
Gallbladder and appendix surgically absent.
Probable slightly dilated cystic duct stump unchanged.
Small focal fluid collection with partially calcified walls at tail
of pancreas, 15 x 9 mm image 31, unchanged.
Liver, spleen, pancreas, kidneys, and adrenal glands otherwise
normal.
Scattered normal-sized mesenteric lymph nodes.

Diverticulosis of sigmoid colon with sigmoid wall thickening images
71 - 77, corresponding to site of acute diverticulitis on prior CT.
Pericolic inflammatory changes seen on prior exam resolved.
Remaining bowel loops and stomach unremarkable.
No mass, adenopathy, free fluid or acute inflammatory process.
Minimal asymmetric enlargement of left inguinal canal versus right,
question subtle left inguinal hernia.
No acute osseous findings.
IMPRESSION: Sigmoid and descending colonic diverticulosis with persistent area
of wall thickening of the sigmoid colon, similar to that seen on
the previous exam.
Resolution of previously identified pericolic inflammatory changes.
Stable small focal fluid collection at the tail of pancreas,
demonstrating minimal wall calcification.
This is unchanged since 7282 and similar in size that seen in 9996,
though the wall calcifications are new since 9996.
This may represent a calcified chronic pseudocyst in a patient with
history of pancreatitis, or represent a small stable cystic
pancreatic mass.

Small inferior mediastinal lipoma versus para esophageal herniation
of abdominal fat, containing a stable small calcified nodule as
above.
No other intra abdominal or intrapelvic abnormalities identified.

## 2011-12-21 ENCOUNTER — Ambulatory Visit (INDEPENDENT_AMBULATORY_CARE_PROVIDER_SITE_OTHER): Payer: Medicare Other | Admitting: Internal Medicine

## 2011-12-29 ENCOUNTER — Encounter: Payer: Self-pay | Admitting: Cardiology

## 2012-01-31 ENCOUNTER — Ambulatory Visit (INDEPENDENT_AMBULATORY_CARE_PROVIDER_SITE_OTHER): Payer: Medicare Other | Admitting: Internal Medicine

## 2012-01-31 ENCOUNTER — Encounter (INDEPENDENT_AMBULATORY_CARE_PROVIDER_SITE_OTHER): Payer: Self-pay | Admitting: Internal Medicine

## 2012-01-31 VITALS — BP 130/78 | HR 74 | Temp 96.7°F | Resp 20 | Ht 69.0 in | Wt 160.8 lb

## 2012-01-31 DIAGNOSIS — K219 Gastro-esophageal reflux disease without esophagitis: Secondary | ICD-10-CM | POA: Insufficient documentation

## 2012-01-31 DIAGNOSIS — Z8719 Personal history of other diseases of the digestive system: Secondary | ICD-10-CM

## 2012-01-31 DIAGNOSIS — E781 Pure hyperglyceridemia: Secondary | ICD-10-CM

## 2012-01-31 NOTE — Patient Instructions (Signed)
Notify if you another episode of upper abdominal pain in which case we'll check serum amylase and lipase.

## 2012-01-31 NOTE — Progress Notes (Signed)
Presenting complaint;  Followup for chronic pancreatitis.  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 He did experience left upper quadrant pain about 2 weeks ago. This pain was associated with nausea but no fever or emesis. He states he overexerted himself by doing too much physical activity. His pain has not resolved. His heartburn is well controlled with therapy. His weight has remained stable since his last visit. He does regular exercise to includes stationary bike, wakes and pushups. At times he feels fatigued and he is able to carry on with his day-to-day responsibilities. He had blood work by Dr. Sudie Bailey in January 2013. His hemoglobin was 18.5 g. He was supposed to have a followup exam at forgot to do so. He is planning to get it done prior to his next with Dr. Sudie Bailey next week.  Current Medications: Current Outpatient Prescriptions  Medication Sig Dispense Refill  . clopidogrel (PLAVIX) 75 MG tablet Take 75 mg by mouth daily.       . diazepam (VALIUM) 10 MG tablet Take 10 mg by mouth. As Needed      . enalapril (VASOTEC) 10 MG tablet Take 2.5 mg by mouth daily.       Marland Kitchen gemfibrozil (LOPID) 600 MG tablet Take 300 mg by mouth daily.        Marland Kitchen glipiZIDE (GLUCOTROL) 10 MG tablet Take 5 mg by mouth 2 (two) times daily.       . hydrocodone-acetaminophen (LORCET PLUS) 7.5-650 MG per tablet Take 1 tablet by mouth every 6 (six) hours as needed.        . insulin glargine (LANTUS) 100 UNIT/ML injection Inject 40 Units into the skin at bedtime.       . metFORMIN (GLUCOPHAGE) 1000 MG tablet Take 1,000 mg by mouth daily.       . metoprolol (LOPRESSOR) 100 MG tablet Take 25 mg by mouth 2 (two) times daily.       . nitroGLYCERIN (NITROSTAT) 0.4 MG SL tablet Place 0.4 mg under the tongue every 5 (five) minutes as needed.        . pantoprazole (PROTONIX)  40 MG tablet Take 1 tablet (40 mg total) by mouth daily.  30 tablet  11  . PARoxetine (PAXIL) 10 MG tablet Take 10 mg by mouth daily.        . pravastatin (PRAVACHOL) 40 MG tablet Take 40 mg by mouth at bedtime.        . promethazine (PHENERGAN) 25 MG tablet Take 25 mg by mouth as needed.           Objective: Blood pressure 130/78, pulse 74, temperature 96.7 F (35.9 C), temperature source Oral, resp. rate 20, height 5\' 9"  (1.753 m), weight 160 lb 12.8 oz (72.938 kg). Conjunctiva is pink. Sclera is nonicteric Oropharyngeal mucosa is normal. No neck masses or thyromegaly noted. Abdomen abdomen is symmetrical. Bowel sounds are normal. On palpation soft abdomen without tenderness or organomegaly. Specifically his spleen is not palpable. No LE edema or clubbing noted.  Labs/studies Results: From 08/31/2011. WBC 4.4, H&H 18.5 and 55.3 MCV 91.2 platelet count 134K. Comprehensive chemistry panel normal except glucose was 147. Total cholesterol 224, HDL cholesterol 46, LDL cholesterol 134 and triglycerides 219. Triglyceride level was 2109 mg/dL on 13/03/6577.  Assessment:  #1. 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 He developed pseudocyst but these have gradually resolved. He underwent EUS  in October 2012 which was surprisingly within normal limits. He should not experience pancreatitis as long as triglyceride levels remain below 1000 mg. Should he experience another bout of significant upper abdominal pain would consider checking his amylase and lipase. #2. Familial hypertriglyceridemia. This is the first time that his triglyceride level has been below 250. I hope he can keep triglyceride levels in this range. He is on low-dose gemfibrozil as he cannot tolerate higher doses. #3. hemoglobin is 18.5 g her platelet count is just below normal. He needs to have lab studies repeated as recommended by Dr. Sudie Bailey and if his hemoglobin remains high he needs to be further evaluated to  rule out polycythemia. #4. GERD symptoms well controlled with PPI. He also has history of gastroparesis but currently doing well with dietary measures and a PPI.   Plan:  Notify if upper abdominal pain recurs. Office visit in one year. Patient encouraged to proceed with scheduled blood work as recommended by Dr. Sudie Bailey.

## 2012-03-17 ENCOUNTER — Encounter (HOSPITAL_COMMUNITY): Payer: Medicare Other | Attending: Oncology | Admitting: Oncology

## 2012-03-17 ENCOUNTER — Encounter (HOSPITAL_COMMUNITY): Payer: Self-pay | Admitting: Oncology

## 2012-03-17 VITALS — BP 123/74 | HR 76 | Temp 97.4°F | Ht 69.0 in | Wt 161.0 lb

## 2012-03-17 DIAGNOSIS — D696 Thrombocytopenia, unspecified: Secondary | ICD-10-CM

## 2012-03-17 DIAGNOSIS — D751 Secondary polycythemia: Secondary | ICD-10-CM

## 2012-03-17 DIAGNOSIS — E781 Pure hyperglyceridemia: Secondary | ICD-10-CM

## 2012-03-17 DIAGNOSIS — I25810 Atherosclerosis of coronary artery bypass graft(s) without angina pectoris: Secondary | ICD-10-CM

## 2012-03-17 NOTE — Patient Instructions (Addendum)
[name]  [id number] 1960/09/21 Dr. Glenford Peers  [facility]  Discharge Instructions  RECOMMENDATIONS MADE BY THE CONSULTANT AND ANY TEST RESULTS WILL BE SENT TO YOUR REFERRING DOCTOR.   EXAM FINDINGS BY MD TODAY AND SIGNS AND SYMPTOMS TO REPORT TO CLINIC OR PRIMARY MD: exam and discussion per MD.  Need to check some labs and do CT scan of your abdomen to check size of you liver and spleen.  MEDICATIONS PRESCRIBED: Predinsone 50 mg 13 hours, 7 hours and 1 hour prior to your CTscans on 03/28/12. Benadryl 50 mg 1 hour prior to the CT scans on 03/28/12. Follow label directions  INSTRUCTIONS GIVEN AND DISCUSSED: Other [facility] Radiology Department  Instructions for CT Abdomen/Pelvis  [name], your exam is scheduled for Tuesday  03/28/12 at  1  pm.  **WARNING** IF YOU ARE ALLERGIC TO IODINE/X-RAY DYE, PLEASE NOTIFY us IMMEDIATELY.  YOU MAY NEED ADDITIONAL PRE-MEDICATIONS THE DAY PRIOR TO YOUR EXAM.   Please follow these instructions on the day of your exam.   Do not eat or drink anything after 11am.  (2 hours prior to the exam time)   You will be given two bottles of ReadiCat oral contrast solution to drink.  The solution may taste better if refrigerated, but do not add ice.  SHAKE WELL BEFORE DRINKING   Drink one bottle of ReadiCat (barium solution) at 11am.  (2 hours prior to your exam)   Drink one bottle of ReadiCat (barium solution) at 12 noon.  (1 hour prior to your exam)   You may take any medications as prescribed, with a small amount of water, if necessary.   Please arrive 30 minutes prior to appointment time to register.   Come in and report to Short Stay to register.   If registering in any area other than Radiology, notify the Radiology front office upon your arrival in the Radiology Department and they will notify the CT Department.  The purpose of you drinking the oral contrast solution (ReadiCat) is to aid  in the visualization of your intestinal tract.  The contrast solution may cause some diarrhea. Before your exam is started, you will be given a small amount of water to drink.  Depending on your individual set of symptoms, you may also receive an intravenous injection of x-ray contrast/iodine.  Plan on being in Radiology for 30-60 minutes or longer, depending on the type of exam you are having performed.  If you have any questions regarding your procedure, you may call the CT Department at [phone].    SPECIAL INSTRUCTIONS/FOLLOW-UP: Lab work Needed 8/6, Xray Studies Needed 8/6 and Return to Clinic on 8/27 to see MD.   I acknowledge that I have been informed and understand all the instructions given to me and received a copy. I do not have any more questions at this time, but understand that I may call the Specialty Clinic at [facility] at [phone] during business hours should I have any further questions or need assistance in obtaining follow-up care.    __________________________________________  _____________  __________ Signature of Patient or Authorized Representative            Date                   Time    __________________________________________ Nurse's Signature

## 2012-03-17 NOTE — Progress Notes (Signed)
Problem #1 erythrocytosis unclear etiology occurring in a nonsmoker though he is exposed to his wife's smoking of at least one half a pack of cigarettes per day  Problem #2 hypertriglyceridemia, hereditary, causing pancreatitis on several occasions in the past.  Problem #3 diabetes mellitus on therapy  Problem #4 CABG in the past also with a stent placement.  Problem #5 GERD  Pleasant 51 year old gentleman with the above-mentioned problem list whose main problems have revolved around his hypertriglyceridemia and the several episodes of pancreatitis heart disease etc. He has not been a smoker. Family history is strong for hypertriglyceridemia in a brother died at age 23 of an MI/other complications and his mother died at age 42. He is partially disabled it sounds like but still works and is very active. He was noticed to have elevation in his red cell count and hemoglobin sometime within the last year. He is not aware of weight loss fevers but he does sweat at night somewhat which went his close but he does not have to change sheets or pillows but he does have change his shirt and sometimes unaware. He has a good appetite. He is not aware of any adenopathy. He had a CAT scan last year which suggested a large her than normal spleen and some fatty infiltration of his liver upon my review.  He has not noticed blood in his urine or blood in his stool and he sees Dr. Karilyn Cota on a regular basis he does not have a history of heart disease as a child or heart murmur.  His medications are listed in the chart. Vital signs are recorded he has no lymphadenopathy no thyromegaly. His lungs are clear to auscultation and percussion. He is alert and oriented. He is in no acute distress. HEENT exam is unremarkable. He has no gynecomastia. His skin is very tan from the use of the tanning bed. His heart shows a regular rhythm and rate without murmur rub or gallop. He has a midline chest incision which is well-healed. His  abdomen is soft and nontender without distinct organomegaly. Bowel sounds are normal. He has no peripheral edema. Pulses in his feet are 2+ and symmetrical. He is right handed. Facial symmetry is intact.  Is not clear why this gentleman has erythrocytosis but it needs further investigation. He wants to pursue the laboratory workup and CAT scans that I've ordered sometime in the next 2 or 3 weeks due to his work schedule which is fine.

## 2012-03-28 ENCOUNTER — Other Ambulatory Visit (HOSPITAL_COMMUNITY): Payer: Self-pay | Admitting: Oncology

## 2012-03-28 ENCOUNTER — Encounter (HOSPITAL_COMMUNITY): Payer: Medicare Other | Attending: Oncology

## 2012-03-28 ENCOUNTER — Other Ambulatory Visit (HOSPITAL_COMMUNITY): Payer: Medicare Other

## 2012-03-28 ENCOUNTER — Ambulatory Visit (HOSPITAL_COMMUNITY)
Admission: RE | Admit: 2012-03-28 | Discharge: 2012-03-28 | Disposition: A | Discharge status: Home / Self Care | Payer: Medicare Other | Source: Ambulatory Visit | Attending: Oncology | Admitting: Oncology

## 2012-03-28 DIAGNOSIS — D751 Secondary polycythemia: Secondary | ICD-10-CM

## 2012-03-28 DIAGNOSIS — R161 Splenomegaly, not elsewhere classified: Secondary | ICD-10-CM | POA: Insufficient documentation

## 2012-03-28 DIAGNOSIS — D696 Thrombocytopenia, unspecified: Secondary | ICD-10-CM | POA: Insufficient documentation

## 2012-03-28 DIAGNOSIS — D596 Hemoglobinuria due to hemolysis from other external causes: Secondary | ICD-10-CM | POA: Insufficient documentation

## 2012-03-28 DIAGNOSIS — D599 Acquired hemolytic anemia, unspecified: Secondary | ICD-10-CM

## 2012-03-28 DIAGNOSIS — K5730 Diverticulosis of large intestine without perforation or abscess without bleeding: Secondary | ICD-10-CM | POA: Insufficient documentation

## 2012-03-28 LAB — COMPREHENSIVE METABOLIC PANEL
ALT: 22 U/L (ref 0–53)
Albumin: 3.8 g/dL (ref 3.5–5.2)
Alkaline Phosphatase: 54 U/L (ref 39–117)
Chloride: 98 mEq/L (ref 96–112)
Potassium: 4.6 mEq/L (ref 3.5–5.1)
Sodium: 133 mEq/L — ABNORMAL LOW (ref 135–145)
Total Bilirubin: 0.8 mg/dL (ref 0.3–1.2)
Total Protein: 7.1 g/dL (ref 6.0–8.3)

## 2012-03-28 LAB — CBC WITH DIFFERENTIAL/PLATELET
Basophils Absolute: 0 10*3/uL (ref 0.0–0.1)
Basophils Relative: 0 % (ref 0–1)
HCT: 54.2 % — ABNORMAL HIGH (ref 39.0–52.0)
Hemoglobin: 19 g/dL — ABNORMAL HIGH (ref 13.0–17.0)
Lymphocytes Relative: 4 % — ABNORMAL LOW (ref 12–46)
MCHC: 35.1 g/dL (ref 30.0–36.0)
Monocytes Absolute: 0.2 10*3/uL (ref 0.1–1.0)
Monocytes Relative: 2 % — ABNORMAL LOW (ref 3–12)
Neutro Abs: 8.6 10*3/uL — ABNORMAL HIGH (ref 1.7–7.7)
Neutrophils Relative %: 93 % — ABNORMAL HIGH (ref 43–77)
RDW: 13.1 % (ref 11.5–15.5)
WBC: 9.3 10*3/uL (ref 4.0–10.5)

## 2012-03-28 LAB — URINALYSIS, DIPSTICK ONLY
Glucose, UA: >1000 mg/dL — AB
Ketones, ur: NEGATIVE mg/dL
Leukocytes, UA: NEGATIVE
Nitrite: NEGATIVE
Protein, ur: 30 mg/dL — AB
Urobilinogen, UA: 0.2 mg/dL (ref 0.0–1.0)

## 2012-03-28 LAB — RETICULOCYTES
RBC.: 6.2 MIL/uL — ABNORMAL HIGH (ref 4.22–5.81)
Retic Count, Absolute: 99.2 10*3/uL (ref 19.0–186.0)
Retic Ct Pct: 1.6 % (ref 0.4–3.1)

## 2012-03-28 MED ORDER — IOHEXOL 300 MG/ML  SOLN
100.0000 mL | Freq: Once | INTRAMUSCULAR | Status: AC | PRN
  Administered 2012-03-28: 100 mL via INTRAVENOUS

## 2012-03-28 NOTE — Progress Notes (Signed)
Labs drawn today for cbc/diff,cmp,epo,ferr, retic, Iron and IBC, Urine dipstick

## 2012-03-29 LAB — IRON AND TIBC
Saturation Ratios: 30 % (ref 20–55)
TIBC: 312 ug/dL (ref 215–435)
UIBC: 218 ug/dL (ref 125–400)

## 2012-03-30 ENCOUNTER — Encounter (HOSPITAL_BASED_OUTPATIENT_CLINIC_OR_DEPARTMENT_OTHER): Payer: Medicare Other

## 2012-03-30 DIAGNOSIS — D599 Acquired hemolytic anemia, unspecified: Secondary | ICD-10-CM

## 2012-03-30 DIAGNOSIS — D596 Hemoglobinuria due to hemolysis from other external causes: Secondary | ICD-10-CM

## 2012-03-30 NOTE — Progress Notes (Signed)
[name] presents today for phlebotomy per MD orders. Phlebotomy procedure started at 1140 and ended at 1146. 500 cc removed. Patient tolerated procedure well. IV needle removed intact.  Patient feeling light headed after procedure. Patient has eaten graham crackers and has drunk a diet gingerale. Patient has used the bathroom also.  Patient instructed to rest today, drink a lot of fluids, and to eat. No driving if feeling light headed. Patient verbalized understanding. Patient also instructed that he should feel better tomorrow.

## 2012-04-10 ENCOUNTER — Other Ambulatory Visit (HOSPITAL_COMMUNITY): Payer: Self-pay

## 2012-04-10 DIAGNOSIS — D751 Secondary polycythemia: Secondary | ICD-10-CM

## 2012-04-18 ENCOUNTER — Ambulatory Visit (HOSPITAL_COMMUNITY): Payer: Medicare Other | Admitting: Oncology

## 2012-04-18 ENCOUNTER — Other Ambulatory Visit (HOSPITAL_COMMUNITY): Payer: Medicare Other

## 2012-04-19 ENCOUNTER — Encounter (HOSPITAL_COMMUNITY): Payer: Medicare Other

## 2012-04-19 ENCOUNTER — Other Ambulatory Visit (HOSPITAL_COMMUNITY): Payer: Medicare Other

## 2012-04-21 ENCOUNTER — Encounter (HOSPITAL_BASED_OUTPATIENT_CLINIC_OR_DEPARTMENT_OTHER): Payer: Medicare Other

## 2012-04-21 DIAGNOSIS — D751 Secondary polycythemia: Secondary | ICD-10-CM

## 2012-04-21 NOTE — Addendum Note (Signed)
Addended by: Oda Kilts on: 04/21/2012 12:07 PM   Modules accepted: Orders

## 2012-04-21 NOTE — Progress Notes (Signed)
[name] presents today for phlebotomy per MD orders. Phlebotomy procedure started at 1127 and ended at 1133. 500 cc removed. Patient tolerated procedure well. IV needle removed intact.

## 2012-04-26 ENCOUNTER — Encounter (HOSPITAL_COMMUNITY): Payer: Medicare Other | Attending: Oncology | Admitting: Oncology

## 2012-04-26 ENCOUNTER — Other Ambulatory Visit (HOSPITAL_COMMUNITY): Payer: Medicare Other

## 2012-04-26 ENCOUNTER — Encounter (HOSPITAL_COMMUNITY): Payer: Self-pay

## 2012-04-26 ENCOUNTER — Encounter (HOSPITAL_BASED_OUTPATIENT_CLINIC_OR_DEPARTMENT_OTHER): Payer: Medicare Other

## 2012-04-26 VITALS — BP 148/109 | HR 88 | Temp 98.3°F | Resp 20 | Wt 164.7 lb

## 2012-04-26 VITALS — BP 150/100 | HR 83

## 2012-04-26 DIAGNOSIS — D45 Polycythemia vera: Secondary | ICD-10-CM | POA: Insufficient documentation

## 2012-04-26 DIAGNOSIS — D751 Secondary polycythemia: Secondary | ICD-10-CM | POA: Insufficient documentation

## 2012-04-26 HISTORY — DX: Polycythemia vera: D45

## 2012-04-26 LAB — CBC WITH DIFFERENTIAL/PLATELET
HCT: 45.1 % (ref 39.0–52.0)
Hemoglobin: 15.4 g/dL (ref 13.0–17.0)
Lymphs Abs: 1.2 10*3/uL (ref 0.7–4.0)
Monocytes Absolute: 0.4 10*3/uL (ref 0.1–1.0)
Monocytes Relative: 9 % (ref 3–12)
Neutro Abs: 2.9 10*3/uL (ref 1.7–7.7)
Neutrophils Relative %: 62 % (ref 43–77)
RBC: 5.16 MIL/uL (ref 4.22–5.81)

## 2012-04-26 NOTE — Progress Notes (Signed)
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 He has had 2 phlebotomies a 500 cc each and he feels better interestingly already. He is a nonsmoker.  I have discussed his findings with him and he has asked many good questions. The goal is to keep his hemoglobin at 15 g or less and to keep his hematocrit 45% or less.  He is amenable to regular followup which we will do. He does also understand that our a person and we'll go on to develop a leukemic transformation. This is rare but he didn't need to hear this as a possible complication of this disease process.  We will see him in 3 months check his blood work today continue to phlebotomize him as needed but right now he does not need Hydrea since his white count and platelets are very well within the normal range.

## 2012-04-26 NOTE — Progress Notes (Signed)
[name] presents today for phlebotomy per MD orders. Phlebotomy procedure started at 1528 and ended at 1538. 500 cc removed. Patient tolerated procedure well. IV needle removed intact.

## 2012-05-03 ENCOUNTER — Encounter (HOSPITAL_COMMUNITY): Payer: Medicare Other

## 2012-05-03 ENCOUNTER — Other Ambulatory Visit (HOSPITAL_COMMUNITY): Payer: Medicare Other

## 2012-05-10 ENCOUNTER — Encounter (HOSPITAL_COMMUNITY): Payer: Medicare Other

## 2012-05-16 ENCOUNTER — Encounter (HOSPITAL_BASED_OUTPATIENT_CLINIC_OR_DEPARTMENT_OTHER): Payer: Medicare Other

## 2012-05-16 DIAGNOSIS — D751 Secondary polycythemia: Secondary | ICD-10-CM

## 2012-05-16 NOTE — Progress Notes (Signed)
[name] presents today for phlebotomy per MD orders. Phlebotomy procedure started at 1029 and ended at 1041. 500 cc removed. Patient tolerated procedure well. IV needle removed intact.

## 2012-05-17 ENCOUNTER — Encounter (HOSPITAL_COMMUNITY): Payer: Medicare Other

## 2012-06-07 ENCOUNTER — Other Ambulatory Visit (HOSPITAL_COMMUNITY): Payer: Medicare Other

## 2012-06-14 ENCOUNTER — Encounter (HOSPITAL_COMMUNITY): Payer: Medicare Other | Attending: Oncology

## 2012-06-14 ENCOUNTER — Other Ambulatory Visit (HOSPITAL_COMMUNITY): Payer: Medicare Other

## 2012-06-14 DIAGNOSIS — D45 Polycythemia vera: Secondary | ICD-10-CM | POA: Insufficient documentation

## 2012-06-14 DIAGNOSIS — D751 Secondary polycythemia: Secondary | ICD-10-CM

## 2012-06-14 LAB — CBC
HCT: 45.4 % (ref 39.0–52.0)
Hemoglobin: 15.2 g/dL (ref 13.0–17.0)
MCH: 28.5 pg (ref 26.0–34.0)
MCV: 85.2 fL (ref 78.0–100.0)
RBC: 5.33 MIL/uL (ref 4.22–5.81)

## 2012-06-14 NOTE — Progress Notes (Signed)
Labs drawn today for cbc 

## 2012-07-05 ENCOUNTER — Encounter (HOSPITAL_COMMUNITY): Payer: Medicare Other

## 2012-07-12 ENCOUNTER — Ambulatory Visit (HOSPITAL_BASED_OUTPATIENT_CLINIC_OR_DEPARTMENT_OTHER): Payer: Medicare Other

## 2012-07-12 VITALS — BP 144/99 | HR 65

## 2012-07-12 DIAGNOSIS — D45 Polycythemia vera: Secondary | ICD-10-CM

## 2012-07-12 NOTE — Patient Instructions (Addendum)
[facility]  Discharge Instructions  RECOMMENDATIONS MADE BY THE CONSULTANT AND ANY TEST RESULTS WILL BE SENT TO YOUR REFERRING DOCTOR.   EXAM FINDINGS BY MD TODAY AND SIGNS AND SYMPTOMS TO REPORT TO CLINIC OR PRIMARY MD:   Please keep a check on your Blood Pressure.   I acknowledge that I have been informed and understand all the instructions given to me and received a copy. I do not have any more questions at this time, but understand that I may call the Specialty Clinic at [facility] at [phone] during business hours should I have any further questions or need assistance in obtaining follow-up care.    __________________________________________  _____________  __________ Signature of Patient or Authorized Representative            Date                   Time    __________________________________________ Nurse's Signature

## 2012-07-12 NOTE — Progress Notes (Signed)
[name] presents today for phlebotomy per MD orders. Phlebotomy procedure started at 1423 and ended at 1434. 500 cc removed. Patient tolerated procedure well. IV needle removed intact.

## 2012-07-19 ENCOUNTER — Encounter (HOSPITAL_COMMUNITY): Payer: Medicare Other

## 2012-07-26 ENCOUNTER — Encounter (HOSPITAL_COMMUNITY): Payer: Medicare Other

## 2012-07-26 ENCOUNTER — Ambulatory Visit (HOSPITAL_COMMUNITY): Payer: Medicare Other | Admitting: Oncology

## 2012-07-31 ENCOUNTER — Encounter (HOSPITAL_COMMUNITY): Payer: Medicare Other | Attending: Oncology | Admitting: Oncology

## 2012-07-31 ENCOUNTER — Encounter (HOSPITAL_BASED_OUTPATIENT_CLINIC_OR_DEPARTMENT_OTHER): Payer: Medicare Other

## 2012-07-31 VITALS — BP 156/110 | HR 74

## 2012-07-31 DIAGNOSIS — D45 Polycythemia vera: Secondary | ICD-10-CM

## 2012-07-31 NOTE — Progress Notes (Signed)
[name] presents today for phlebotomy per MD orders. Phlebotomy procedure started at 1117 and ended at 1124. 500 cc removed. Patient tolerated procedure well. IV needle removed intact.

## 2012-07-31 NOTE — Progress Notes (Signed)
Milana Obey, MD [address] [place] [address]  1. Polycythemia vera     CURRENT THERAPY: S/P phlebotomies on 04/26/2012, 05/16/2012, 07/12/2012, 07/31/2012  INTERVAL HISTORY: MADDAX PALINKAS 51 y.o. male returns for  regular  visit for followup of polycythemia vera, Jak2 negative.  Not in need of Hydrea with a normal white count and platelet count.   Tim received a phlebotomy today.  We discussed our goal of a Hgb of 15 g with a secondary goal of a Hct of less than 45%.  I personally reviewed and went over laboratory results with the patient.  All questions were answered.  He was re-educated at his request about the risk of P.vera transforming into a leukemia.   Going forward, we will perform lab work every 4 weeks.  If he requires a phlebotomy, will refer him to the [facility] for donation.  He is agreeable to this plan.  He was educated to contact us after donation so the date can be documented.   He denies any B symptoms today.  He tolerated the phlebotomy well.   Past Medical History  Diagnosis Date  . Coronary artery disease     Patent Stent to Circ.  Patent coronary bypass grafts with a free radial graft toPDA and LIMA to the  diag.  . Diabetes mellitus   . Pancreatitis chronic     Pseudocyst  . Dyslipidemia     Poorly controlled, probably related to his DM (Some of this is secondary to his inability to afford medications).  . ED (erectile dysfunction)   . 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 Metaplasia of esophagus   . Chronic gastritis   . Esophageal yeast infection   . Polycythemia vera 04/26/2012    has DM; DYSLIPIDEMIA; CAD; CHEST PAIN, ACUTE; Hypertension; H/O chronic pancreatitis; GERD (gastroesophageal reflux disease); and Polycythemia vera on his problem list.     is allergic to metoclopramide hcl; iodinated diagnostic agents; iohexol; penicillins; and sulfonamide derivatives.  Mr. Redner does not currently have medications on file.  Past  Surgical History  Procedure Date  . Appendectomy   . Cholecystectomy   . Knee surgery     left knee  . Nasal septum surgery   . Finger tendon repair     in middle finger  . Coronary artery bypass graft 11/24/2004  . Cardiac catheterization   . Esophagogastroduodenoscopy 09/19/01  . Colonoscopy 07/31/1985  . Coronary angioplasty with stent placement 2006    Denies any headaches, dizziness, double vision, fevers, chills, night sweats, nausea, vomiting, diarrhea, constipation, chest pain, heart palpitations, shortness of breath, blood in stool, black tarry stool, urinary pain, urinary burning, urinary frequency, hematuria.   PHYSICAL EXAMINATION  ECOG PERFORMANCE STATUS: 1 - Symptomatic but completely ambulatory  There were no vitals filed for this visit.  GENERAL:alert, no distress, well nourished, well developed, comfortable, cooperative and smiling SKIN: skin color, texture, turgor are normal, no rashes or significant lesions HEAD: Normocephalic, No masses, lesions, tenderness or abnormalities EYES: normal, Conjunctiva are pink and non-injected EARS: External ears normal OROPHARYNX:mucous membranes are moist  NECK: supple, no adenopathy, trachea midline LYMPH:  no palpable lymphadenopathy BREAST:not examined LUNGS: clear to auscultation and percussion HEART: regular rate & rhythm, no murmurs, no gallops, S1 normal and S2 normal ABDOMEN:abdomen soft, non-tender, and normal bowel sounds BACK: Back symmetric, no curvature., No CVA tenderness EXTREMITIES:less then 2 second capillary refill, no joint deformities, effusion, or inflammation, no edema,  no skin discoloration, no clubbing, no cyanosis  NEURO: alert & oriented x 3 with fluent speech, no focal motor/sensory deficits, gait normal    LABORATORY DATA: CBC    Component Value Date/Time   WBC 4.6 06/14/2012 1110   RBC 5.33 06/14/2012 1110   HGB 15.2 06/14/2012 1110   HCT 45.4 06/14/2012 1110   PLT 176 06/14/2012 1110    MCV 85.2 06/14/2012 1110   MCH 28.5 06/14/2012 1110   MCHC 33.5 06/14/2012 1110   RDW 13.2 06/14/2012 1110   LYMPHSABS 1.2 04/26/2012 1223   MONOABS 0.4 04/26/2012 1223   EOSABS 0.2 04/26/2012 1223   BASOSABS 0.0 04/26/2012 1223      Chemistry      Component Value Date/Time   NA 133* 03/28/2012 1048   K 4.6 03/28/2012 1048   CL 98 03/28/2012 1048   CO2 21 03/28/2012 1048   BUN 17 03/28/2012 1048   CREATININE 0.88 03/28/2012 1048      Component Value Date/Time   CALCIUM 9.8 03/28/2012 1048   ALKPHOS 54 03/28/2012 1048   AST 24 03/28/2012 1048   ALT 22 03/28/2012 1048   BILITOT 0.8 03/28/2012 1048     Results for JAXXON, NAEEM (MRN [id number]) as of 07/31/2012 11:50  Ref. Range 04/21/2012 12:07  JAK2 GenotypR No range found Not Detected     RADIOGRAPHIC STUDIES:  03/28/2012  *RADIOLOGY REPORT*  Clinical Data: Splenomegaly. Previous appendectomy,  cholecystectomy.  CT ABDOMEN AND PELVIS WITH CONTRAST  Technique: Multidetector CT imaging of the abdomen and pelvis was  performed following the standard protocol during bolus  administration of intravenous contrast.  Contrast: OMNIPAQUE IOHEXOL 300 MG/ML SOLN the patient was  premedicated per protocol. History of contrast allergy  Comparison: 04/12/2011  Findings: Patchy coronary calcifications. Visualized lung bases  clear. Vascular clips in the gallbladder fossa. Unremarkable  liver, adrenal glands, kidneys. Spleen 13.4 cm craniocaudal length  (previously 14.4), unremarkable. Stable 8 x 16 mm lesion in the  pancreatic tail containing calcifications and central low  attenuation. Portal vein and splenic vein remain patent. Minimal  aortoiliac arterial plaque. A few sub centimeter central  mesenteric lymph nodes. The stomach is distended with ingested  material. Small bowel decompressed. The colon is nondilated with  a few distal descending and sigmoid diverticula; no adjacent  inflammatory/edematous change. Urinary bladder is incompletely   distended. Moderate prostatic enlargement central coarse  calcifications. No ascites. No free air. Bilateral L5 pars  defects with early grade 1 anterolisthesis L5-S1.  IMPRESSION:  1. No acute abdominal process.  2. Stable peripherally calcified cystic pancreatic tail  pseudocyst or mass.  3. Descending and sigmoid diverticulosis.  4. Atherosclerosis, including aortoiliac and coronary artery  disease. Please note that although the presence of coronary artery  calcium documents the presence of coronary artery disease, the  severity of this disease and any potential stenosis cannot be  assessed on this non-gated CT examination. Assessment for  potential risk factor modification, dietary therapy or  pharmacologic therapy may be warranted, if clinically indicated.  Original Report Authenticated By: Osa Craver, M.D.     ASSESSMENT:  1. Jak 2 negative, Polycythemia Vera.  Not in need of hydrea with a normal white count and platelet count.    PLAN:  1. I personally reviewed and went over laboratory results with the patient. 2. Discussion regarding Hgb and HCT goals of 15 and 45% respectively.  3. Patient education regarding polycythemia vera diagnosis. 4. Lab work every  4 weeks: cbc 5. Will cancel Jan appt with Dr. Mariel Sleet and reschedule for 3 months 6. Patient will be referred to [facility] for future phlebotomy when needed.  7. Return in 3 months for follow-up.   All questions were answered. The patient knows to call the clinic with any problems, questions or concerns. We can certainly see the patient much sooner if necessary.  The patient and plan discussed with Glenford Peers, MD and he is in agreement with the aforementioned.  KEFALAS,THOMAS

## 2012-07-31 NOTE — Patient Instructions (Addendum)
[facility] Discharge Instructions  RECOMMENDATIONS MADE BY THE CONSULTANT AND ANY TEST RESULTS WILL BE SENT TO YOUR REFERRING PHYSICIAN.  EXAM FINDINGS BY THE PHYSICIAN TODAY AND SIGNS OR SYMPTOMS TO REPORT TO CLINIC OR PRIMARY PHYSICIAN: exam and discussion by PA.  Will check lab work every 4 weeks and have you seen in follow-up in 3 months.  MEDICATIONS PRESCRIBED:  none  INSTRUCTIONS GIVEN AND DISCUSSED: Report unusual bruising or bleeding or other problems.  SPECIAL INSTRUCTIONS/FOLLOW-UP: Labs monthly and to be seen in follow up in 3 months.  Thank you for choosing [facility] to provide your oncology and hematology care.  To afford each patient quality time with our providers, please arrive at least 15 minutes before your scheduled appointment time.  With your help, our goal is to use those 15 minutes to complete the necessary work-up to ensure our physicians have the information they need to help with your evaluation and healthcare recommendations.    Effective January 1st, 2014, we ask that you re-schedule your appointment with our physicians should you arrive 10 or more minutes late for your appointment.  We strive to give you quality time with our providers, and arriving late affects you and other patients whose appointments are after yours.    Again, thank you for choosing [facility].  Our hope is that these requests will decrease the amount of time that you wait before being seen by our physicians.       _____________________________________________________________  I acknowledge that I have been informed and understand all the instructions given to me and received a copy. I do not have anymore questions at this time but understand that I may call the Cancer Center at [facility] at [phone] during business hours should I have any further questions or need assistance in obtaining follow-up  care.    __________________________________________  _____________  __________ Signature of Patient or Authorized Representative            Date                   Time    __________________________________________ Nurse's Signature

## 2012-08-01 MED ORDER — SODIUM CHLORIDE 0.9 % IJ SOLN
INTRAMUSCULAR | Status: AC
  Filled 2012-08-01: qty 70

## 2012-08-14 ENCOUNTER — Other Ambulatory Visit (HOSPITAL_COMMUNITY): Payer: Medicare Other

## 2012-08-28 ENCOUNTER — Ambulatory Visit (HOSPITAL_COMMUNITY): Payer: Medicare Other | Admitting: Oncology

## 2012-08-28 ENCOUNTER — Other Ambulatory Visit (HOSPITAL_COMMUNITY): Payer: Medicare Other

## 2012-09-05 ENCOUNTER — Encounter (HOSPITAL_COMMUNITY): Payer: Medicare Other | Attending: Oncology

## 2012-09-05 DIAGNOSIS — D45 Polycythemia vera: Secondary | ICD-10-CM

## 2012-09-05 LAB — CBC
HCT: 45.2 % (ref 39.0–52.0)
MCH: 25.1 pg — ABNORMAL LOW (ref 26.0–34.0)
MCV: 79.9 fL (ref 78.0–100.0)
Platelets: 179 10*3/uL (ref 150–400)
RBC: 5.66 MIL/uL (ref 4.22–5.81)
WBC: 5.1 10*3/uL (ref 4.0–10.5)

## 2012-09-05 NOTE — Progress Notes (Signed)
Labs drawn today for cbc 

## 2012-10-02 ENCOUNTER — Other Ambulatory Visit (HOSPITAL_COMMUNITY): Payer: Medicare Other

## 2012-10-30 ENCOUNTER — Encounter (HOSPITAL_COMMUNITY): Payer: Medicare Other | Attending: Oncology

## 2012-10-30 DIAGNOSIS — D45 Polycythemia vera: Secondary | ICD-10-CM

## 2012-10-30 LAB — CBC
HCT: 48.7 % (ref 39.0–52.0)
MCH: 23.9 pg — ABNORMAL LOW (ref 26.0–34.0)
MCHC: 31 g/dL (ref 30.0–36.0)
MCV: 77.2 fL — ABNORMAL LOW (ref 78.0–100.0)
RDW: 16.8 % — ABNORMAL HIGH (ref 11.5–15.5)

## 2012-10-30 NOTE — Progress Notes (Signed)
Labs drawn today for cbc 

## 2012-11-01 ENCOUNTER — Ambulatory Visit (HOSPITAL_COMMUNITY): Payer: Medicare Other | Admitting: Oncology

## 2012-11-06 ENCOUNTER — Ambulatory Visit (INDEPENDENT_AMBULATORY_CARE_PROVIDER_SITE_OTHER): Payer: Medicare Other | Admitting: Internal Medicine

## 2012-11-10 ENCOUNTER — Encounter: Payer: Self-pay | Admitting: Cardiology

## 2012-11-10 ENCOUNTER — Ambulatory Visit (INDEPENDENT_AMBULATORY_CARE_PROVIDER_SITE_OTHER): Payer: Medicare Other | Admitting: Cardiology

## 2012-11-10 VITALS — BP 150/100 | HR 54 | Ht 69.0 in | Wt 167.0 lb

## 2012-11-10 DIAGNOSIS — R079 Chest pain, unspecified: Secondary | ICD-10-CM

## 2012-11-10 DIAGNOSIS — E7800 Pure hypercholesterolemia, unspecified: Secondary | ICD-10-CM

## 2012-11-10 MED ORDER — ENALAPRIL MALEATE 10 MG PO TABS: 5.0000 mg | ORAL_TABLET | Freq: Every day | ORAL | Status: DC

## 2012-11-10 NOTE — Patient Instructions (Addendum)
Please increase your Enalapril to 5 mg a day. Continue all other medications as listed.  Your physician has requested that you have a lexiscan myoview. For further information please visit [email or web address] Please follow instruction sheet, as given.  Please have fasting blood work the same day as your stress test.  Follow up in 6 months with Dr Antoine Poche.  You will receive a letter in the mail 2 months before you are due.  Please call us when you receive this letter to schedule your follow up appointment.

## 2012-11-10 NOTE — Progress Notes (Signed)
HPI The patient presents for followup of his known coronary disease.  Since I last saw him he has done had some chest discomfort. This has been going on for a couple of weeks. He describes a swelling in his chest and down into his shoulder. He has a sick nauseated feeling with this. He's had some discomfort under his left arm.. He has taken naproxen and this has helped recently. He's not been exercising as much. He has no new shortness of breath, PND or orthopnea. There's no new palpitations, presyncope or syncope. He is having treatment for PVR with frequent blood letting.  Allergies  Allergen Reactions  . Metoclopramide Hcl Hives    Patient became very spastic  . Iodinated Diagnostic Agents   . Iohexol      Desc: HIVES,SOB NEEDS PREP.MEDS   . Penicillins   . Sulfonamide Derivatives     Current Outpatient Prescriptions  Medication Sig Dispense Refill  . clopidogrel (PLAVIX) 75 MG tablet Take 75 mg by mouth daily.       . diazepam (VALIUM) 10 MG tablet Take 10 mg by mouth. As Needed      . enalapril (VASOTEC) 10 MG tablet Take 2.5 mg by mouth daily.      Marland Kitchen gemfibrozil (LOPID) 600 MG tablet Take 300 mg by mouth daily.        Marland Kitchen glipiZIDE (GLUCOTROL) 10 MG tablet Take 5 mg by mouth 2 (two) times daily.       . hydrocodone-acetaminophen (LORCET PLUS) 7.5-650 MG per tablet Take 1 tablet by mouth every 6 (six) hours as needed.        . insulin glargine (LANTUS) 100 UNIT/ML injection Inject 50 Units into the skin at bedtime.       . metFORMIN (GLUCOPHAGE) 1000 MG tablet Take 1,000 mg by mouth 2 (two) times daily with a meal.       . metoprolol (LOPRESSOR) 100 MG tablet Take 25 mg by mouth 2 (two) times daily.       . nitroGLYCERIN (NITROSTAT) 0.4 MG SL tablet Place 0.4 mg under the tongue every 5 (five) minutes as needed.        . pantoprazole (PROTONIX) 40 MG tablet Take 1 tablet (40 mg total) by mouth daily.  30 tablet  11  . PARoxetine (PAXIL) 10 MG tablet Take 10 mg by mouth daily.         . pravastatin (PRAVACHOL) 40 MG tablet Take by mouth at bedtime. Takes a whole tablet at bedtime and 1/4 tab in am      . promethazine (PHENERGAN) 25 MG tablet Take 25 mg by mouth as needed.         No current facility-administered medications for this visit.    Past Medical History  Diagnosis Date  . Coronary artery disease     Patent Stent to Circ.  Patent coronary bypass grafts with a free radial graft toPDA and LIMA to the  diag.  . Diabetes mellitus   . Pancreatitis chronic     Pseudocyst  . Dyslipidemia     Poorly controlled, probably related to his DM (Some of this is secondary to his inability to afford medications).  . ED (erectile dysfunction)   . 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 Metaplasia of esophagus   . Chronic gastritis   . Esophageal yeast infection   . Polycythemia vera 04/26/2012    Past Surgical History  Procedure Laterality Date  . Appendectomy    .  Cholecystectomy    . Knee surgery      left knee  . Nasal septum surgery    . Finger tendon repair      in middle finger  . Coronary artery bypass graft  11/24/2004  . Cardiac catheterization    . Esophagogastroduodenoscopy  09/19/01  . Colonoscopy  07/31/1985  . Coronary angioplasty with stent placement  2006    ROS  As stated in the HPI and negative for all other systems.  PHYSICAL EXAM BP 150/100  Pulse 54  Ht 5\' 9"  (1.753 m)  Wt 167 lb (75.751 kg)  BMI 24.65 kg/m2 GENERAL:  Well appearing HEENT:  Pupils equal round and reactive, fundi not visualized, oral mucosa unremarkable NECK:  No jugular venous distention, waveform within normal limits, carotid upstroke brisk and symmetric, no bruits, no thyromegaly LYMPHATICS:  No cervical, inguinal adenopathy LUNGS:  Clear to auscultation bilaterally BACK:  No CVA tenderness CHEST:  Well healed sternotomy scar. HEART:  PMI not displaced or sustained,S1 and S2 within normal limits, no S3, no S4, no clicks, no rubs, no murmurs ABD:  Flat, positive bowel  sounds normal in frequency in pitch, no bruits, no rebound, no guarding, no midline pulsatile mass, no hepatomegaly, no splenomegaly EXT:  2 plus pulses throughout, no edema, no cyanosis no clubbing  EKG:  Normal sinus rhythm, rate 54, incomplete right bundle branch block, no acute ST-T wave changes.  11/10/2012   ASSESSMENT AND PLAN  CAD:  His chest pain is somewhat atypical. However, stress testing is indicated.  In going to try walking on a treadmill. This will be with perfusion imaging. I suspect we might not be able to get his heart rate up and it may  [employer].  need to be converted to  HTN:  His BP is not controlled.  I will increase the lisinopril to 5 mg.    HYPERLIPIDEMIA:  I will check a lipid profile when he returns.

## 2012-11-14 ENCOUNTER — Other Ambulatory Visit (INDEPENDENT_AMBULATORY_CARE_PROVIDER_SITE_OTHER): Payer: Medicare Other

## 2012-11-14 ENCOUNTER — Ambulatory Visit (HOSPITAL_COMMUNITY): Payer: Medicare Other | Attending: Cardiology | Admitting: Radiology

## 2012-11-14 ENCOUNTER — Other Ambulatory Visit: Payer: Medicare Other

## 2012-11-14 VITALS — BP 150/107 | HR 62 | Ht 69.0 in | Wt 166.0 lb

## 2012-11-14 DIAGNOSIS — I2510 Atherosclerotic heart disease of native coronary artery without angina pectoris: Secondary | ICD-10-CM

## 2012-11-14 DIAGNOSIS — R002 Palpitations: Secondary | ICD-10-CM | POA: Insufficient documentation

## 2012-11-14 DIAGNOSIS — Z8249 Family history of ischemic heart disease and other diseases of the circulatory system: Secondary | ICD-10-CM | POA: Insufficient documentation

## 2012-11-14 DIAGNOSIS — E119 Type 2 diabetes mellitus without complications: Secondary | ICD-10-CM | POA: Insufficient documentation

## 2012-11-14 DIAGNOSIS — R5381 Other malaise: Secondary | ICD-10-CM | POA: Insufficient documentation

## 2012-11-14 DIAGNOSIS — R5383 Other fatigue: Secondary | ICD-10-CM | POA: Insufficient documentation

## 2012-11-14 DIAGNOSIS — Z951 Presence of aortocoronary bypass graft: Secondary | ICD-10-CM | POA: Insufficient documentation

## 2012-11-14 DIAGNOSIS — E7800 Pure hypercholesterolemia, unspecified: Secondary | ICD-10-CM

## 2012-11-14 DIAGNOSIS — R079 Chest pain, unspecified: Secondary | ICD-10-CM | POA: Insufficient documentation

## 2012-11-14 LAB — LDL CHOLESTEROL, DIRECT: Direct LDL: 72.9 mg/dL

## 2012-11-14 MED ORDER — TECHNETIUM TC 99M SESTAMIBI GENERIC - CARDIOLITE
30.0000 | Freq: Once | INTRAVENOUS | Status: AC | PRN
  Administered 2012-11-14: 30 via INTRAVENOUS

## 2012-11-14 MED ORDER — REGADENOSON 0.4 MG/5ML IV SOLN
0.4000 mg | Freq: Once | INTRAVENOUS | Status: AC
  Administered 2012-11-14: 0.4 mg via INTRAVENOUS

## 2012-11-14 MED ORDER — TECHNETIUM TC 99M SESTAMIBI GENERIC - CARDIOLITE
10.0000 | Freq: Once | INTRAVENOUS | Status: AC | PRN
  Administered 2012-11-14: 10 via INTRAVENOUS

## 2012-11-14 NOTE — Progress Notes (Signed)
[facility] SITE 3 NUCLEAR MED [address] [place], [place] [address] [phone]    Cardiology Nuclear Med Study  [name] is a 52 y.o. male     MRN : [id number]     DOB: 1961-05-05  Procedure Date: 11/14/2012  Nuclear Med Background Indication for Stress Test:  Evaluation for Ischemia and Stent/Graft Patency History:  '06 CABG; '07 Stent-CFX; '10 Cath:patent grafts, EF=55% Cardiac Risk Factors: Family History - CAD, Hypertension, IDDM Type 2 and Lipids  Symptoms:  Chest Pain/Tightness with and without Exertion (last episode of chest discomfort was yesterday), Fatigue, Palpitations and Rapid HR   Nuclear Pre-Procedure Caffeine/Decaff Intake:  None > 12 hrs NPO After: 9:30pm   Lungs:  Clear. O2 Sat: 98% on room air. IV 0.9% NS with Angio Cath:  20g  IV Site: R Antecubital x 1, tolerated well IV Started by:  Irean Hong, RN  Chest Size (in):  42 Cup Size: n/a  Height: 5\' 9"  (1.753 m)  Weight:  166 lb (75.297 kg)  BMI:  Body mass index is 24.5 kg/(m^2). Tech Comments:  Took Lopressor @11 :00 pm last night, no insulin or diabetic medications today; FBS was 165@8 :45, and 168 @12 :00 noon here    Nuclear Med Study 1 or 2 day study: 1 day  Stress Test Type:  Lexiscan  Reading MD: Kristeen Miss, MD  Order Authorizing Provider:  Rollene Rotunda, MD  Resting Radionuclide: Technetium 59m Sestamibi  Resting Radionuclide Dose: 11.0 mCi   Stress Radionuclide:  Technetium 97m Sestamibi  Stress Radionuclide Dose: 33.0 mCi           Stress Protocol Rest HR: 62 Stress HR: 96  Rest BP: 150/107 Stress BP: 146/111  Exercise Time (min): n/a METS: n/a   Predicted Max HR: 170 bpm % Max HR: 56.47 bpm Rate Pressure Product: 46962   Dose of Adenosine (mg):  n/a Dose of Lexiscan: 0.4 mg  Dose of Atropine (mg): n/a Dose of Dobutamine: n/a mcg/kg/min (at max HR)  Stress Test Technologist: Smiley Houseman, CMA-N  Nuclear Technologist:  Domenic Polite, CNMT     Rest  Procedure:  Myocardial perfusion imaging was performed at rest 45 minutes following the intravenous administration of Technetium 16m Sestamibi.  Rest ECG: NSR with non-specific ST-T wave changes  Stress Procedure: The patient was to initially walk the treadmill utilizing the Bruce protocol, but due to baseline hypertension he was given Lexiscan instead.  He then received IV Lexiscan 0.4 mg over 15-seconds.  Technetium 38m Sestamibi injected at 30-seconds.  Quantitative spect images were obtained after a 45 minute delay.  Stress ECG: No significant ST segment change suggestive of ischemia.  QPS Raw Data Images:  Normal; no motion artifact; normal heart/lung ratio. Stress Images:  Normal homogeneous uptake in all areas of the myocardium. Rest Images:  Normal homogeneous uptake in all areas of the myocardium. Subtraction (SDS):  No evidence of ischemia. Transient Ischemic Dilatation (Normal <1.22):  1.05 Lung/Heart Ratio (Normal <0.45):  0.29  Quantitative Gated Spect Images QGS EDV:  65 ml QGS ESV:  27 ml  Impression Exercise Capacity:  Lexiscan with no exercise. BP Response:  Normal blood pressure response. Clinical Symptoms:  No significant symptoms noted. ECG Impression:  No significant ST segment change suggestive of ischemia. Comparison with Prior Nuclear Study: No images to compare  Overall Impression:  Normal stress nuclear study.  No evidence of ischemia.    LV Ejection Fraction: 58%.  LV Wall Motion:  NL LV Function;  NL Wall Motion.    Vesta Mixer, Montez Hageman., MD, [facility] 11/14/2012, 5:08 PM Office - [phone] Pager [phone]

## 2012-11-22 ENCOUNTER — Other Ambulatory Visit: Payer: Self-pay

## 2012-11-22 MED ORDER — ENALAPRIL MALEATE 10 MG PO TABS: 5.0000 mg | ORAL_TABLET | Freq: Every day | ORAL | Status: DC

## 2012-11-22 NOTE — Telephone Encounter (Signed)
..   Requested Prescriptions   Pending Prescriptions Disp Refills  . enalapril (VASOTEC) 10 MG tablet 90 tablet 3    Sig: Take 0.5 tablets (5 mg total) by mouth daily.

## 2012-12-13 ENCOUNTER — Other Ambulatory Visit (HOSPITAL_COMMUNITY): Payer: Medicare Other

## 2012-12-20 ENCOUNTER — Ambulatory Visit (HOSPITAL_COMMUNITY): Payer: Medicare Other | Admitting: Oncology

## 2012-12-20 NOTE — Progress Notes (Signed)
-  No show, letter sent-   

## 2012-12-22 ENCOUNTER — Other Ambulatory Visit (INDEPENDENT_AMBULATORY_CARE_PROVIDER_SITE_OTHER): Payer: Self-pay | Admitting: Internal Medicine

## 2012-12-22 DIAGNOSIS — K219 Gastro-esophageal reflux disease without esophagitis: Secondary | ICD-10-CM

## 2012-12-22 MED ORDER — PANTOPRAZOLE SODIUM 40 MG PO TBEC: 40.0000 mg | DELAYED_RELEASE_TABLET | Freq: Every day | ORAL | Status: DC

## 2012-12-27 ENCOUNTER — Other Ambulatory Visit (INDEPENDENT_AMBULATORY_CARE_PROVIDER_SITE_OTHER): Payer: Self-pay | Admitting: Internal Medicine

## 2013-01-01 ENCOUNTER — Ambulatory Visit (INDEPENDENT_AMBULATORY_CARE_PROVIDER_SITE_OTHER): Payer: Medicare Other | Admitting: Internal Medicine

## 2013-01-01 ENCOUNTER — Encounter (INDEPENDENT_AMBULATORY_CARE_PROVIDER_SITE_OTHER): Payer: Self-pay | Admitting: Internal Medicine

## 2013-01-01 ENCOUNTER — Other Ambulatory Visit (INDEPENDENT_AMBULATORY_CARE_PROVIDER_SITE_OTHER): Payer: Self-pay | Admitting: Internal Medicine

## 2013-01-01 VITALS — BP 130/90 | HR 70 | Temp 97.0°F | Resp 20 | Ht 69.0 in | Wt 166.8 lb

## 2013-01-01 DIAGNOSIS — K219 Gastro-esophageal reflux disease without esophagitis: Secondary | ICD-10-CM

## 2013-01-01 DIAGNOSIS — Z8719 Personal history of other diseases of the digestive system: Secondary | ICD-10-CM

## 2013-01-01 NOTE — Patient Instructions (Addendum)
Do not take Creon anymore. Can take domperidone 10 mg by mouth 30 minutes before evening meal. Call with progress report in 4 weeks. Notify if you have epigastric pain diarrhea or weight loss

## 2013-01-01 NOTE — Progress Notes (Signed)
Presenting complaint;  Follow for chronic pancreatitis and gastroparesis.  Subjective:  Patient is 52 year old Caucasian male who is here for yearly visit. He was last seen in June 2013. His weight is up by 6 pounds since his last visit. He did have an episode of nausea vomiting and diarrhea last month the symptoms resolved spontaneously. He has intermittent nausea. He believes he may have taken promethazine 10 or 12 times in the last 4 months. He has not experienced any side effects with this medication. He rarely has a good appetite. He notices heartburn when his blood glucoses elevated or if he eats who said he should not. He wants Creon prescription filled. However he's been taking it on as-needed basis. He states if he takes it regularly he gets diarrhea. He denies excessive flatulence orally or bulky stools. He also denies epigastric pain. Patient tells me he lost his brother in January this year possibly due to COPD. He was 52 years old.  Current Medications: Current Outpatient Prescriptions  Medication Sig Dispense Refill  . clopidogrel (PLAVIX) 75 MG tablet Take 75 mg by mouth daily.       . diazepam (VALIUM) 10 MG tablet Take 10 mg by mouth. As Needed      . enalapril (VASOTEC) 10 MG tablet Take 0.5 tablets (5 mg total) by mouth daily.  90 tablet  3  . gemfibrozil (LOPID) 600 MG tablet Take 300 mg by mouth daily.       Marland Kitchen glipiZIDE (GLUCOTROL) 10 MG tablet Take 5 mg by mouth 2 (two) times daily.       . hydrocodone-acetaminophen (LORCET PLUS) 7.5-650 MG per tablet Take 1 tablet by mouth every 6 (six) hours as needed.        . insulin glargine (LANTUS) 100 UNIT/ML injection Inject 50 Units into the skin at bedtime.       . metFORMIN (GLUCOPHAGE) 1000 MG tablet Take 1,000 mg by mouth 2 (two) times daily with a meal.       . metoprolol (LOPRESSOR) 100 MG tablet Take 25 mg by mouth 2 (two) times daily.       . nitroGLYCERIN (NITROSTAT) 0.4 MG SL tablet Place 0.4 mg under the tongue every 5  (five) minutes as needed.        . pantoprazole (PROTONIX) 40 MG tablet TAKE 1 TABLET BY MOUTH DAILY.  30 tablet  1  . PARoxetine (PAXIL) 10 MG tablet Take 10 mg by mouth daily.        . pravastatin (PRAVACHOL) 40 MG tablet Take by mouth at bedtime. Takes a whole tablet at bedtime and 1/4 tab in am      . promethazine (PHENERGAN) 25 MG tablet Take 25 mg by mouth as needed.         No current facility-administered medications for this visit.     Objective: Blood pressure 130/90, pulse 70, temperature 97 F (36.1 C), temperature source Oral, resp. rate 20, height 5\' 9"  (1.753 m), weight 166 lb 12.8 oz (75.66 kg). Patient is alert and in no acute distress. Conjunctiva is pink. Sclera is nonicteric Oropharyngeal mucosa is normal. No neck masses or thyromegaly noted. Cardiac exam with regular rhythm normal S1 and S2. No murmur or gallop noted. Lungs are clear to auscultation. Abdomen is symmetrical. Bowel sounds are normal. He has mild tenderness at LLQ. No organomegaly or masses noted. No LE edema or clubbing noted.   Assessment:  #1. 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 Last CT was in July  2013 revealing small calcified cyst in tail of pancreas. Pancreatic duct and body are had was not dilated. He does not have any symptoms to suggest exocrine pancreatic insufficiency. He's been using Creon sporadically and not having any symptoms. Therefore will ask him not to take pancreatic enzyme supplement. #2. Gastroparesis. Gastroparesis is felt to be secondary to diabetes and medications. He had study in August 2003 and T 1/2 was 208 minutes. Since he is having frequent nausea and need for rheumatic is and I would like him to go back on domperidone as he still has a few pills left    Plan:  Patient advised to take domperidone 10 mg before evening meal daily. Discontinue Creon. Patient advised to take PPI in the morning and clopidogrel in the evening to minimize  interaction. Progress report in 4 weeks. Patient advised to call office should he experience epigastric  Pain, diarrhea or weight loss in which case he will need blood work. Office visit in one year. Will plan pancreatic CT next year.

## 2013-01-01 NOTE — Telephone Encounter (Signed)
He does not need it for now

## 2013-01-17 ENCOUNTER — Encounter (HOSPITAL_COMMUNITY): Payer: Self-pay | Admitting: Oncology

## 2013-02-12 ENCOUNTER — Encounter (HOSPITAL_COMMUNITY): Payer: Self-pay | Admitting: Oncology

## 2013-03-14 ENCOUNTER — Encounter (HOSPITAL_COMMUNITY): Payer: Self-pay | Admitting: Oncology

## 2013-03-15 ENCOUNTER — Telehealth (INDEPENDENT_AMBULATORY_CARE_PROVIDER_SITE_OTHER): Payer: Self-pay | Admitting: *Deleted

## 2013-03-15 DIAGNOSIS — R197 Diarrhea, unspecified: Secondary | ICD-10-CM

## 2013-03-15 DIAGNOSIS — K859 Acute pancreatitis without necrosis or infection, unspecified: Secondary | ICD-10-CM

## 2013-03-15 DIAGNOSIS — R140 Abdominal distension (gaseous): Secondary | ICD-10-CM

## 2013-03-15 DIAGNOSIS — R110 Nausea: Secondary | ICD-10-CM

## 2013-03-15 NOTE — Telephone Encounter (Signed)
The patient started Antibiotic on 02/26/13. Per Dr.Rehman he will need to get C-Diff, Amylase , Lipase Patient was called and made aware , he will go to the lab this afternoon to get labs. Patient ask if we could also do H/H as he is to give blood next week and his PCP is out of office today. This will be addressed with Dr.Rehman . Per Dr. Karilyn Cota patient may have this lab.

## 2013-03-15 NOTE — Telephone Encounter (Signed)
Patient called and states the following: Abdomen is swollen for a week and half, and he is nauseated. Patient started Levofloxacin ( Levaquin) 500 mg on for a sinus infection. He says he has only been taking 1/2 the tablet. Stomach feels very irritated, bloated. Diarrhea for several days , then he feels the urge to go and he can't go. He is passing mucous, and states he sees a creamy white stuff . Afraid to eat and when he does he will bloat up and his weight this morning is 161 lbs.  [name] also states that about 3 weeks ago he was outside working and he dropped down on his knee , something bit him. Knee swelled , itching ,burning and sharp pain. He took Benadryl for this but he feels that this may have been the beginning of all of this or the irritation began. Patient was advised that Dr.Rehman would be made aware and I would contact him with his recommendations.

## 2013-03-16 LAB — LIPASE: Lipase: 34 U/L (ref 0–75)

## 2013-03-16 LAB — HEMOGLOBIN AND HEMATOCRIT, BLOOD: HCT: 47 % (ref 39.0–52.0)

## 2013-03-16 LAB — AMYLASE: Amylase: 33 U/L (ref 0–105)

## 2013-05-10 ENCOUNTER — Telehealth (INDEPENDENT_AMBULATORY_CARE_PROVIDER_SITE_OTHER): Payer: Self-pay | Admitting: *Deleted

## 2013-05-10 NOTE — Telephone Encounter (Signed)
This information was called to Dr.Rehman to address and fax prescriptions if needed.

## 2013-05-10 NOTE — Telephone Encounter (Signed)
His staying bloated really bad and feels like he needs to go back on the Creon 6,000. Would like to add 2 to 3, whatever Dr. Karilyn Cota thinks along with keeping him on the Domperidone. His return phone number is [phone].

## 2013-05-10 NOTE — Telephone Encounter (Signed)
Forwarded to Dr.Rehman. 

## 2013-05-11 ENCOUNTER — Encounter (INDEPENDENT_AMBULATORY_CARE_PROVIDER_SITE_OTHER): Payer: Self-pay | Admitting: *Deleted

## 2013-05-11 ENCOUNTER — Other Ambulatory Visit (INDEPENDENT_AMBULATORY_CARE_PROVIDER_SITE_OTHER): Payer: Self-pay | Admitting: *Deleted

## 2013-05-11 MED ORDER — PANCRELIPASE (LIP-PROT-AMYL) 6000-19000 UNITS PO CPEP: 3.0000 | ORAL_CAPSULE | Freq: Three times a day (TID) | ORAL | Status: DC

## 2013-05-11 NOTE — Telephone Encounter (Signed)
Forwarded to Dr.Rehman to address. 

## 2013-05-11 NOTE — Telephone Encounter (Signed)
This encounter was created in error - please disregard.

## 2013-05-11 NOTE — Telephone Encounter (Signed)
Take 3 capsules of Creon 6000 with each meal and one to two with each snack. He also should take domperidone at least 10 mg twice daily

## 2013-05-11 NOTE — Telephone Encounter (Signed)
Patient called and made aware , he is asking that refill request for the Creon be sent tot he Pharmacy/[facility].

## 2013-05-11 NOTE — Telephone Encounter (Signed)
Refill request sent. to Dr.Rehman 

## 2013-06-26 ENCOUNTER — Other Ambulatory Visit: Payer: Self-pay

## 2013-06-26 ENCOUNTER — Telehealth: Payer: Self-pay | Admitting: *Deleted

## 2013-06-26 DIAGNOSIS — Z79899 Other long term (current) drug therapy: Secondary | ICD-10-CM

## 2013-06-26 DIAGNOSIS — I10 Essential (primary) hypertension: Secondary | ICD-10-CM

## 2013-06-26 MED ORDER — ENALAPRIL MALEATE 5 MG PO TABS: 5.0000 mg | ORAL_TABLET | Freq: Every day | ORAL | Status: DC

## 2013-06-26 NOTE — Telephone Encounter (Signed)
OK to continue 10 mg and check BMET.

## 2013-06-26 NOTE — Telephone Encounter (Signed)
Left message for pt to call back to discuss BP.  When he increased his medication.  He probably needs lab work to check BMP.  Will forward to Dr Antoine Poche to review

## 2013-06-27 ENCOUNTER — Telehealth: Payer: Self-pay | Admitting: Cardiology

## 2013-06-27 MED ORDER — ENALAPRIL MALEATE 10 MG PO TABS: 10.0000 mg | ORAL_TABLET | Freq: Every day | ORAL | Status: DC

## 2013-06-27 NOTE — Telephone Encounter (Signed)
Follow up       Pt would like you to call his cell.   Pt returning you call.

## 2013-06-27 NOTE — Telephone Encounter (Signed)
Called pt back - he will folllow up with his PCP who increased his medications.  He reports his blood pressure is still elevated on the increased dose.  He thinks he has had a BMP recently.  He will double check and have it done if needed.

## 2013-06-27 NOTE — Telephone Encounter (Signed)
left message for pt to have blood work and that its OK to stay on 10 mg a day

## 2013-10-01 ENCOUNTER — Telehealth (INDEPENDENT_AMBULATORY_CARE_PROVIDER_SITE_OTHER): Payer: Self-pay | Admitting: *Deleted

## 2013-10-01 ENCOUNTER — Other Ambulatory Visit (INDEPENDENT_AMBULATORY_CARE_PROVIDER_SITE_OTHER): Payer: Self-pay | Admitting: Internal Medicine

## 2013-10-01 MED ORDER — NYSTATIN 100000 UNIT/ML MT SUSP: 5.0000 mL | Freq: Four times a day (QID) | OROMUCOSAL | Status: DC

## 2013-10-01 NOTE — Telephone Encounter (Signed)
Prescription for Mycostatin suspension sent to  patient's pharmacy and patient informed

## 2013-10-01 NOTE — Telephone Encounter (Signed)
[name] has been on antibiotics for a sinus infection that has now caused the burning in his esophagus, the same thing as before.  Would like to see if Dr. Laural Golden would please call him in something for this. His return phone number is [phone].

## 2013-10-01 NOTE — Telephone Encounter (Signed)
To Dr.Rehman

## 2013-10-01 NOTE — Telephone Encounter (Signed)
Forwarded to Dr.Rehman to make recommendation. 

## 2013-11-08 ENCOUNTER — Encounter (INDEPENDENT_AMBULATORY_CARE_PROVIDER_SITE_OTHER): Payer: Self-pay

## 2013-11-08 ENCOUNTER — Telehealth (INDEPENDENT_AMBULATORY_CARE_PROVIDER_SITE_OTHER): Payer: Self-pay | Admitting: *Deleted

## 2013-11-08 NOTE — Telephone Encounter (Signed)
The web site is requiring a prescription for his Domperidone to be ordered. It also gives a fax number for providers and it is [phone]. He is going to order the 200 and will need refills.  Patient's return phone number is [phone].

## 2013-11-08 NOTE — Telephone Encounter (Signed)
Patient was called and a message left making him aware that his prescription for Domperidone had been faxed to the number that he had provided.

## 2013-11-08 NOTE — Telephone Encounter (Signed)
Prescription written for domperidone 10 mg 3 times a day 200 with 3 refills.

## 2013-11-08 NOTE — Telephone Encounter (Signed)
Forwarded to Dr.Rehman to write the RX.

## 2014-01-21 ENCOUNTER — Encounter (INDEPENDENT_AMBULATORY_CARE_PROVIDER_SITE_OTHER): Payer: Self-pay | Admitting: Internal Medicine

## 2014-01-21 ENCOUNTER — Ambulatory Visit (INDEPENDENT_AMBULATORY_CARE_PROVIDER_SITE_OTHER): Payer: Medicare Other | Admitting: Internal Medicine

## 2014-01-21 VITALS — BP 130/90 | HR 72 | Temp 97.0°F | Resp 18 | Ht 69.0 in | Wt 168.0 lb

## 2014-01-21 DIAGNOSIS — E781 Pure hyperglyceridemia: Secondary | ICD-10-CM

## 2014-01-21 DIAGNOSIS — K3184 Gastroparesis: Secondary | ICD-10-CM

## 2014-01-21 DIAGNOSIS — K861 Other chronic pancreatitis: Secondary | ICD-10-CM

## 2014-01-21 NOTE — Patient Instructions (Signed)
Notify if you have abdominal pain. Can take Imodium OTC 2 mg by mouth on an as-needed basis for diarrhea up to 3 times a day. Please ask lab personnel to send [place] a copy of your blood work to be done end of this month.

## 2014-01-21 NOTE — Progress Notes (Signed)
Presenting complaint;  Follow for chronic pancreatitis, gastroparesis and hypertriglyceridemia   Subjective:  Patient is 53 year old Caucasian male who has chronic pancreatitis with history of pseudocyst secondary to hypertriglyceridemia who presents for scheduled visit. He was last seen on 01/01/2013. He has not experienced an episode of abdominal pain or pancreatitis since his last visit. He is having diarrhea at least 3-4 times a week. You days ago he had 10 stools in a day but on most days he has 4-6. When he does not have diarrhea he is passing formed stools. He denies constipation melena or rectal bleeding. When he is having diarrhea he stops domperidone and pancreatic enzyme supplements. He is watching his diet. His weight is up by 2 pounds since his last visit but he states he has lost 6 pounds in the last 3 months as recommended by Dr. Karie Kirks. He is due for blood work on 02/15/2014 which is to include lites and panel. He is taking OTC pancreatic enzyme preparation which is much cheaper than Creon and he says this as effective as Creon. Hemoglobin A1c was 8.53 months ago and Dr. Karie Kirks is checking it again later this month. He has nausea 3-4 times a week and occasional vomiting. Vomiting usually occurs when his blood glucose level is high.  Current Medications: Outpatient Encounter Prescriptions as of 01/21/2014  Medication Sig  . clopidogrel (PLAVIX) 75 MG tablet Take 75 mg by mouth daily.   . diazepam (VALIUM) 10 MG tablet Take 10 mg by mouth. As Needed  . enalapril (VASOTEC) 10 MG tablet Take 1 tablet (10 mg total) by mouth daily.  Marland Kitchen gemfibrozil (LOPID) 600 MG tablet Take 300 mg by mouth daily.   Marland Kitchen glipiZIDE (GLUCOTROL) 10 MG tablet Take 5 mg by mouth 2 (two) times daily.   . hydrocodone-acetaminophen (LORCET PLUS) 7.5-650 MG per tablet Take 1 tablet by mouth every 6 (six) hours as needed.    . insulin NPH-regular Human (NOVOLIN 70/30) (70-30) 100 UNIT/ML injection 28 Units at  bedtime.  . metFORMIN (GLUCOPHAGE) 1000 MG tablet Take 1,000 mg by mouth 2 (two) times daily with a meal.   . metoprolol (LOPRESSOR) 100 MG tablet Take 25 mg by mouth 2 (two) times daily.   . nitroGLYCERIN (NITROSTAT) 0.4 MG SL tablet Place 0.4 mg under the tongue every 5 (five) minutes as needed.    . NON FORMULARY Domperidone 10 mg Patient takes 2 by mouth daily  . OVER THE COUNTER MEDICATION Patient is taking Pancreatic Enzymes 4X -Amylase 50,000 , Lipase 4000,Protease 50 ,000 , Pancreatic Enzymes 4x 50 mg Patient takes 2 by mouth daily.  . pantoprazole (PROTONIX) 40 MG tablet TAKE 1 TABLET BY MOUTH DAILY.  Marland Kitchen PARoxetine (PAXIL) 10 MG tablet Take 10 mg by mouth daily.    . pravastatin (PRAVACHOL) 40 MG tablet Take 80 mg by mouth daily. Takes a whole tablet at bedtime and 1/4 tab in am  . promethazine (PHENERGAN) 25 MG tablet Take 25 mg by mouth as needed.    . [DISCONTINUED] insulin glargine (LANTUS) 100 UNIT/ML injection Inject 50 Units into the skin at bedtime.   . [DISCONTINUED] nystatin (MYCOSTATIN) 100000 UNIT/ML suspension Take 5 mLs (500,000 Units total) by mouth 4 (four) times daily.  . [DISCONTINUED] Pancrelipase, Lip-Prot-Amyl, 6000 UNITS CPEP Take 3 capsules (18,000 Units total) by mouth 3 (three) times daily.     Objective: Blood pressure 130/90, pulse 72, temperature 97 F (36.1 C), temperature source Oral, resp. rate 18, height 5\' 9"  (1.753 m), weight  168 lb (76.204 kg). Patient is alert and in no acute distress. Conjunctiva is pink. Sclera is nonicteric Oropharyngeal mucosa is normal. No neck masses or thyromegaly noted. Cardiac exam with regular rhythm normal S1 and S2. No murmur or gallop noted. Lungs are clear to auscultation. Abdomen is symmetrical. Bowel sounds are normal. On palpation abdomen is soft without tenderness organomegaly or masses.  No LE edema or clubbing noted.  Labs/studies Results: Most recent abdominopelvic CT from 03/28/2012 reviewed revealing 8 x  16 mm cystic lesion in pancreas with calcification. Serum triglyceride level was 480 on 11/14/2013. Serum triglyceride level was 2109 on 06/18/2011.    Assessment:  #1. 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 Last CT was in August 2013 revealing small cyst in pancreatic tail. Since has actually decreased in size on sequential CT scans. It is crucial that he remains on dietary measures and medication to keep triglyceride levels down. #2. Hypertriglyceridemia. He is due for lab later this month. Would like to see triglyceride level below 250. #3. Gastroparesis secondary to diabetes medicine medications. #4. Intermittent diarrhea most likely secondary to metformin rather than domperidone or pancreatic enzyme supplement.   Plan: Vella Redhead tried domperidone 5 mg by mouth twice a day. Imodium OTC to 2 mg by mouth 3 times a day when necessary. If Imodium does not work he will talk with Dr. Karie Kirks if metformin dose could be halved. Likely profile later this month as planned. Office visit in one year.

## 2014-04-09 ENCOUNTER — Ambulatory Visit (INDEPENDENT_AMBULATORY_CARE_PROVIDER_SITE_OTHER): Payer: Medicare Other | Admitting: Cardiology

## 2014-04-09 ENCOUNTER — Encounter: Payer: Self-pay | Admitting: Cardiology

## 2014-04-09 ENCOUNTER — Other Ambulatory Visit: Payer: Self-pay | Admitting: *Deleted

## 2014-04-09 VITALS — BP 110/80 | HR 59 | Ht 69.0 in | Wt 169.5 lb

## 2014-04-09 DIAGNOSIS — R0789 Other chest pain: Secondary | ICD-10-CM

## 2014-04-09 MED ORDER — ENALAPRIL MALEATE 10 MG PO TABS: 10.0000 mg | ORAL_TABLET | Freq: Every day | ORAL | Status: DC

## 2014-04-09 NOTE — Progress Notes (Signed)
HPI The patient presents for followup of his known coronary disease.  Since I last saw him he has had no new cardiovascular complaints. He has had some chest pain after lifting something. This is a very reasonable soreness with palpation and has been evaluated and treated conservatively. He's not having any substernal discomfort, neck discomfort or other symptoms consistent with previous angina. He's not had any new shortness of breath, PND or orthopnea. He's had no new palpitations, presyncope or syncope. He's had no weight gain or edema.  Allergies  Allergen Reactions  . Metoclopramide Hcl Hives    Patient became very spastic  . Iodinated Diagnostic Agents   . Iohexol      Desc: HIVES,SOB NEEDS PREP.MEDS   . Sulfonamide Derivatives   . Penicillins Rash    Other Reaction: Not Assessed  . Sulfa Antibiotics Rash    Other Reaction: Not Assessed    Current Outpatient Prescriptions  Medication Sig Dispense Refill  . clopidogrel (PLAVIX) 75 MG tablet Take 75 mg by mouth daily.       . diazepam (VALIUM) 10 MG tablet Take 10 mg by mouth. As Needed      . enalapril (VASOTEC) 10 MG tablet Take 1 tablet (10 mg total) by mouth daily.  90 tablet  3  . gemfibrozil (LOPID) 600 MG tablet Take 300 mg by mouth daily.       Marland Kitchen glipiZIDE (GLUCOTROL) 10 MG tablet Take 5 mg by mouth 2 (two) times daily.       . hydrocodone-acetaminophen (LORCET PLUS) 7.5-650 MG per tablet Take 1 tablet by mouth every 6 (six) hours as needed.        . insulin NPH-regular Human (NOVOLIN 70/30) (70-30) 100 UNIT/ML injection 28 Units at bedtime.      . metFORMIN (GLUCOPHAGE) 1000 MG tablet Take 1,000 mg by mouth 2 (two) times daily with a meal.       . metoprolol (LOPRESSOR) 100 MG tablet Take 25 mg by mouth 2 (two) times daily.       . naproxen (NAPROSYN) 500 MG tablet       . nitroGLYCERIN (NITROSTAT) 0.4 MG SL tablet Place 0.4 mg under the tongue every 5 (five) minutes as needed.        . NON FORMULARY Domperidone 10 mg  Patient takes 2 by mouth daily      . OVER THE COUNTER MEDICATION Patient is taking Pancreatic Enzymes 4X -Amylase 50,000 , Lipase 4000,Protease 50 ,000 , Pancreatic Enzymes 4x 50 mg Patient takes 2 by mouth daily.      . pantoprazole (PROTONIX) 40 MG tablet TAKE 1 TABLET BY MOUTH DAILY.  30 tablet  1  . PARoxetine (PAXIL) 10 MG tablet Take 10 mg by mouth daily.        . pravastatin (PRAVACHOL) 40 MG tablet Take 80 mg by mouth daily. Takes a whole tablet at bedtime and 1/4 tab in am      . promethazine (PHENERGAN) 25 MG tablet Take 25 mg by mouth as needed.         No current facility-administered medications for this visit.    Past Medical History  Diagnosis Date  . Coronary artery disease     Patent Stent to Circ.  Patent coronary bypass grafts with a free radial graft toPDA and LIMA to the  diag.  . Diabetes mellitus   . Pancreatitis chronic     Pseudocyst  . Dyslipidemia     Poorly controlled, probably related  to his DM (Some of this is secondary to his inability to afford medications).  . ED (erectile dysfunction)   . 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 Metaplasia of esophagus   . Chronic gastritis   . Esophageal yeast infection   . Polycythemia vera(238.4) 04/26/2012    Past Surgical History  Procedure Laterality Date  . Appendectomy    . Cholecystectomy    . Knee surgery      left knee  . Nasal septum surgery    . Finger tendon repair      in middle finger  . Coronary artery bypass graft  11/24/2004  . Cardiac catheterization    . Esophagogastroduodenoscopy  09/19/01  . Colonoscopy  07/31/1985  . Coronary angioplasty with stent placement  2006    ROS  As stated in the HPI and negative for all other systems.  PHYSICAL EXAM BP 110/80  Pulse 59  Ht 5\' 9"  (1.753 m)  Wt 169 lb 8 oz (76.885 kg)  BMI 25.02 kg/m2 GENERAL:  Well appearing HEENT:  Pupils equal round and reactive, fundi not visualized, oral mucosa unremarkable NECK:  No jugular venous distention,  waveform within normal limits, carotid upstroke brisk and symmetric, no bruits, no thyromegaly LYMPHATICS:  No cervical, inguinal adenopathy LUNGS:  Clear to auscultation bilaterally BACK:  No CVA tenderness CHEST:  Well healed sternotomy scar, soreness to palpation reproducing his symptoms as described above HEART:  PMI not displaced or sustained,S1 and S2 within normal limits, no S3, no S4, no clicks, no rubs, no murmurs ABD:  Flat, positive bowel sounds normal in frequency in pitch, no bruits, no rebound, no guarding, no midline pulsatile mass, no hepatomegaly, no splenomegaly EXT:  2 plus pulses throughout, no edema, no cyanosis no clubbing  EKG:  Normal sinus rhythm, rate 59, incomplete right bundle branch block, no acute ST-T wave changes.  He does have nonspecific T-wave inversion in lead V2 only with a slightly biphasic T wave but there is baseline artifact on the EKG.  04/09/2014   ASSESSMENT AND PLAN  CAD:  The patient has had no symptoms consistent with angina since his stress test last year. No further change in therapy is indicated.  HTN:  His BP is control. He will continue the meds as listed.  HYPERLIPIDEMIA:  I will defer to Robert Bellow, MD

## 2014-04-09 NOTE — Patient Instructions (Signed)
Your physician recommends that you schedule a follow-up appointment in: one year with Dr. Hochrein  

## 2014-08-06 ENCOUNTER — Encounter (INDEPENDENT_AMBULATORY_CARE_PROVIDER_SITE_OTHER): Payer: Self-pay

## 2014-10-03 ENCOUNTER — Encounter (INDEPENDENT_AMBULATORY_CARE_PROVIDER_SITE_OTHER): Payer: Self-pay | Admitting: *Deleted

## 2014-10-15 ENCOUNTER — Ambulatory Visit (INDEPENDENT_AMBULATORY_CARE_PROVIDER_SITE_OTHER): Payer: Medicare Other | Admitting: Cardiology

## 2014-10-15 ENCOUNTER — Encounter: Payer: Self-pay | Admitting: Cardiology

## 2014-10-15 VITALS — BP 150/90 | HR 58 | Ht 69.0 in | Wt 173.0 lb

## 2014-10-15 DIAGNOSIS — R0789 Other chest pain: Secondary | ICD-10-CM

## 2014-10-15 NOTE — Patient Instructions (Addendum)
Your physician recommends that you schedule a follow-up appointment in: 2 months with Dr. Hochrein    We are ordering a stress test for you to get done 

## 2014-10-15 NOTE — Progress Notes (Signed)
HPI The patient presents for followup of his known coronary disease.  Since I last saw him he has continued to have chest discomfort. He's had some discomfort previously but some of this was felt to be musculoskeletal. He thinks this discomfort is new wearing last 6 months and similar to previous angina. He gets chest discomfort when he is walking for instance on a treadmill. This happens for 45 minutes but then goes away as he continues to walk. He denies any associated symptoms such as nausea vomiting or diaphoresis. He has had no palpitations, presyncope or syncope. He has had no weight gain or edema.     Allergies  Allergen Reactions  . Metoclopramide Hcl Hives    Patient became very spastic  . Iodinated Diagnostic Agents     Rash, nausea and flushing  . Iohexol      Desc: HIVES,SOB NEEDS PREP.MEDS   . Sulfonamide Derivatives     Large hives  . Penicillins Rash    Other Reaction: Not Assessed  . Sulfa Antibiotics Rash    Other Reaction: Not Assessed    Current Outpatient Prescriptions  Medication Sig Dispense Refill  . enalapril (VASOTEC) 10 MG tablet Take 10 mg by mouth 2 (two) times daily.    . Omega-3 Fatty Acids (FISH OIL TRIPLE STRENGTH) 1400 MG CAPS Take by mouth.    . clopidogrel (PLAVIX) 75 MG tablet Take 75 mg by mouth daily.     . diazepam (VALIUM) 10 MG tablet Take 10 mg by mouth. As Needed    . gemfibrozil (LOPID) 600 MG tablet Take 300 mg by mouth daily.     Marland Kitchen glipiZIDE (GLUCOTROL) 10 MG tablet Take 5 mg by mouth 2 (two) times daily.     . hydrocodone-acetaminophen (LORCET PLUS) 7.5-650 MG per tablet Take 1 tablet by mouth every 6 (six) hours as needed.      . insulin NPH-regular Human (NOVOLIN 70/30) (70-30) 100 UNIT/ML injection 28 Units at bedtime.    . metFORMIN (GLUCOPHAGE) 1000 MG tablet Take 1,000 mg by mouth 2 (two) times daily with a meal.     . metoprolol (LOPRESSOR) 100 MG tablet Take 25 mg by mouth 2 (two) times daily.     . naproxen (NAPROSYN) 500 MG  tablet     . nitroGLYCERIN (NITROSTAT) 0.4 MG SL tablet Place 0.4 mg under the tongue every 5 (five) minutes as needed.      . NON FORMULARY Domperidone 10 mg Patient takes 2 by mouth daily    . OVER THE COUNTER MEDICATION Patient is taking Pancreatic Enzymes 4X -Amylase 50,000 , Lipase 4000,Protease 50 ,000 , Pancreatic Enzymes 4x 50 mg Patient takes 2 by mouth daily.    . pantoprazole (PROTONIX) 40 MG tablet TAKE 1 TABLET BY MOUTH DAILY. 30 tablet 1  . PARoxetine (PAXIL) 10 MG tablet Take 10 mg by mouth daily.      . pravastatin (PRAVACHOL) 40 MG tablet Take 80 mg by mouth daily. Takes a whole tablet at bedtime and 1/4 tab in am    . promethazine (PHENERGAN) 25 MG tablet Take 25 mg by mouth as needed.       No current facility-administered medications for this visit.    Past Medical History  Diagnosis Date  . Coronary artery disease     Patent Stent to Circ.  Patent coronary bypass grafts with a free radial graft toPDA and LIMA to the  diag.  . Diabetes mellitus   . Pancreatitis chronic  Pseudocyst  . Dyslipidemia     Poorly controlled, probably related to his DM (Some of this is secondary to his inability to afford medications).  . ED (erectile dysfunction)   . 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 Metaplasia of esophagus   . Chronic gastritis   . Esophageal yeast infection   . Polycythemia vera(238.4) 04/26/2012    Past Surgical History  Procedure Laterality Date  . Appendectomy    . Cholecystectomy    . Knee surgery      left knee  . Nasal septum surgery    . Finger tendon repair      in middle finger  . Coronary artery bypass graft  11/24/2004  . Cardiac catheterization    . Esophagogastroduodenoscopy  09/19/01  . Colonoscopy  07/31/1985  . Coronary angioplasty with stent placement  2006    ROS  As stated in the HPI and negative for all other systems.  PHYSICAL EXAM BP 150/90 mmHg  Pulse 58  Ht 5\' 9"  (1.753 m)  Wt 173 lb (78.472 kg)  BMI 25.54 kg/m2 GENERAL:   Well appearing HEENT:  Pupils equal round and reactive, fundi not visualized, oral mucosa unremarkable NECK:  No jugular venous distention, waveform within normal limits, carotid upstroke brisk and symmetric, no bruits, no thyromegaly LYMPHATICS:  No cervical, inguinal adenopathy LUNGS:  Clear to auscultation bilaterally BACK:  No CVA tenderness CHEST:  Well healed sternotomy scar, soreness to palpation reproducing his symptoms as described above HEART:  PMI not displaced or sustained,S1 and S2 within normal limits, no S3, no S4, no clicks, no rubs, no murmurs ABD:  Flat, positive bowel sounds normal in frequency in pitch, no bruits, no rebound, no guarding, no midline pulsatile mass, no hepatomegaly, no splenomegaly EXT:  2 plus pulses throughout, no edema, no cyanosis no clubbing  EKG:  Normal sinus rhythm, rate 58, incomplete right bundle branch block, no acute ST-T wave changes.  He does have nonspecific T-wave inversion in lead V2 only with a slightly biphasic T wave but there is baseline artifact on the EKG. No change from previous.  10/15/2014   ASSESSMENT AND PLAN  CAD: I did review his catheterization from 2006, 2007 and 2008. At this point I think his pain has some typical and atypical features and we should first risk stratify with a stress perfusion study.    HTN:  His BP is elevated.  However, he just had his ACE inhibitor dose increased. I have asked him to keep a blood pressure diary to present to  Robert Bellow, MD.  He might need further medications with the addition of Norvasc.   HYPERLIPIDEMIA:  I will defer to Robert Bellow, MD.  The patient continues to have significant hypertriglyceridemia apparently.  DM:  He does not remember his last A1c and thinks it was around 8.  I will defer to Robert Bellow, MD

## 2014-10-16 ENCOUNTER — Telehealth: Payer: Self-pay | Admitting: Cardiology

## 2014-10-16 NOTE — Telephone Encounter (Signed)
lmsg for patient to call me back.  He needs appt for stress test and to Dr. Michelle Piper in 2 months.

## 2014-10-24 ENCOUNTER — Telehealth (HOSPITAL_COMMUNITY): Payer: Self-pay

## 2014-10-24 NOTE — Telephone Encounter (Signed)
Encounter complete. 

## 2014-10-29 ENCOUNTER — Ambulatory Visit (HOSPITAL_COMMUNITY)
Admission: RE | Admit: 2014-10-29 | Discharge: 2014-10-29 | Disposition: A | Discharge status: Home / Self Care | Payer: Medicare Other | Source: Ambulatory Visit | Attending: Cardiology | Admitting: Cardiology

## 2014-10-29 DIAGNOSIS — I4510 Unspecified right bundle-branch block: Secondary | ICD-10-CM | POA: Diagnosis not present

## 2014-10-29 DIAGNOSIS — I2510 Atherosclerotic heart disease of native coronary artery without angina pectoris: Secondary | ICD-10-CM | POA: Diagnosis not present

## 2014-10-29 DIAGNOSIS — I10 Essential (primary) hypertension: Secondary | ICD-10-CM | POA: Insufficient documentation

## 2014-10-29 DIAGNOSIS — Z951 Presence of aortocoronary bypass graft: Secondary | ICD-10-CM | POA: Insufficient documentation

## 2014-10-29 DIAGNOSIS — E663 Overweight: Secondary | ICD-10-CM | POA: Insufficient documentation

## 2014-10-29 DIAGNOSIS — R42 Dizziness and giddiness: Secondary | ICD-10-CM | POA: Diagnosis not present

## 2014-10-29 DIAGNOSIS — R0609 Other forms of dyspnea: Secondary | ICD-10-CM | POA: Diagnosis not present

## 2014-10-29 DIAGNOSIS — R0789 Other chest pain: Secondary | ICD-10-CM | POA: Diagnosis not present

## 2014-10-29 DIAGNOSIS — E109 Type 1 diabetes mellitus without complications: Secondary | ICD-10-CM | POA: Insufficient documentation

## 2014-10-29 DIAGNOSIS — R002 Palpitations: Secondary | ICD-10-CM | POA: Insufficient documentation

## 2014-10-29 MED ORDER — REGADENOSON 0.4 MG/5ML IV SOLN
0.4000 mg | Freq: Once | INTRAVENOUS | Status: AC
  Administered 2014-10-29: 0.4 mg via INTRAVENOUS

## 2014-10-29 MED ORDER — AMINOPHYLLINE 25 MG/ML IV SOLN
75.0000 mg | Freq: Once | INTRAVENOUS | Status: AC
  Administered 2014-10-29: 75 mg via INTRAVENOUS

## 2014-10-29 MED ORDER — TECHNETIUM TC 99M SESTAMIBI GENERIC - CARDIOLITE
10.9000 | Freq: Once | INTRAVENOUS | Status: AC | PRN
  Administered 2014-10-29: 10.9 via INTRAVENOUS

## 2014-10-29 MED ORDER — TECHNETIUM TC 99M SESTAMIBI GENERIC - CARDIOLITE
29.6000 | Freq: Once | INTRAVENOUS | Status: AC | PRN
  Administered 2014-10-29: 30 via INTRAVENOUS

## 2014-10-29 NOTE — Procedures (Addendum)
[facility] [address] [place] [address] [phone]  Cardiology Nuclear Med Study  MONTAGUE CORELLA is a 54 y.o. male     MRN : [id number]     DOB: [birth year]  Procedure Date: 10/29/2014  Nuclear Med Background Indication for Stress Test:  Follow up CAD; History:  CABG X3;PTCAS-1;Last NUC MPI on 11/14/2012-normal;EF =58%;Incomplete RBBB Cardiac Risk Factors: Hypertension, IDDM Type 1, Lipids, Overweight and RBBB  Symptoms:  Chest Pain, Dizziness, DOE, Fatigue, Light-Headedness and Palpitations   Nuclear Pre-Procedure Caffeine/Decaff Intake:  12:30am NPO After: 8:30am   IV Site: R Forearm  IV 0.9% NS with Angio Cath:  22g  Chest Size (in):  40" IV Started by: Rolene Course, RN  Height: 5\' 9"  (1.753 m)  Cup Size: n/a  BMI:  Body mass index is 25.54 kg/(m^2). Weight:  173 lb (78.472 kg)   Tech Comments:  n/a    Nuclear Med Study 1 or 2 day study: 1 day  Stress Test Type:  [place] Provider:  Minus Breeding, MD   Resting Radionuclide: Technetium 59m Sestamibi  Resting Radionuclide Dose: 10.9 mCi   Stress Radionuclide:  Technetium 55m Sestamibi  Stress Radionuclide Dose: 29.6 mCi           Stress Protocol Rest HR: 63 Stress HR: 82  Rest BP: 162/95 Stress BP: 165/111  Exercise Time (min): n/a METS: n/a          Dose of Adenosine (mg):  n/a Dose of Lexiscan: 0.4 mg  Dose of Atropine (mg): n/a Dose of Dobutamine: n/a mcg/kg/min (at max HR)  Stress Test Technologist: Mellody Memos, CCT Nuclear Technologist: Imagene Riches, CNMT   Rest Procedure:  Myocardial perfusion imaging was performed at rest 45 minutes following the intravenous administration of Technetium 77m Sestamibi. Stress Procedure:  The patient received IV Lexiscan 0.4 mg over 15-seconds.  Technetium 29m Sestamibi injected IV at 30-seconds.  Patient experienced shortness of breath, chest tightness and pressure,also head and ear  pressure and nausea. Patient was administered 75 mg of Aminophylline IV. There were no significant changes with Lexiscan.  Quantitative spect images were obtained after a 45 minute delay.  Transient Ischemic Dilatation (Normal <1.22): 1.21  QGS EDV:  64 ml QGS ESV:  26 ml LV Ejection Fraction: 60%  Rest ECG: NSR - Normal EKG  Stress ECG: No significant change from baseline ECG  QPS Raw Data Images:  Normal; no motion artifact; normal heart/lung ratio. Stress Images:  Normal homogeneous uptake in all areas of the myocardium. Rest Images:  Normal homogeneous uptake in all areas of the myocardium. Subtraction (SDS):  No evidence of ischemia.  Impression Exercise Capacity:  Lexiscan with no exercise. BP Response:  Normal blood pressure response. Clinical Symptoms:  shortness of breath, 8/10 chest tightness ECG Impression:  No significant ECG changes with Lexiscan. Comparison with Prior Nuclear Study: No significant change from previous study  Overall Impression:  Normal stress nuclear study.  LV Wall Motion:  NL LV Function; NL Wall Motion; LVEF 60%  Pixie Casino, MD, [place] Certified in Nuclear Cardiology Attending Cardiologist [place], MD  10/29/2014 4:35 PM

## 2014-12-19 ENCOUNTER — Ambulatory Visit: Payer: Medicare Other | Admitting: Cardiology

## 2015-01-23 ENCOUNTER — Ambulatory Visit (INDEPENDENT_AMBULATORY_CARE_PROVIDER_SITE_OTHER): Payer: Self-pay | Admitting: Internal Medicine

## 2015-01-28 ENCOUNTER — Ambulatory Visit (INDEPENDENT_AMBULATORY_CARE_PROVIDER_SITE_OTHER): Payer: Self-pay | Admitting: Internal Medicine

## 2015-01-31 ENCOUNTER — Ambulatory Visit: Payer: Medicare Other | Admitting: Cardiology

## 2015-02-27 ENCOUNTER — Encounter: Payer: Self-pay | Admitting: Cardiology

## 2015-02-27 ENCOUNTER — Ambulatory Visit (INDEPENDENT_AMBULATORY_CARE_PROVIDER_SITE_OTHER): Payer: Medicare Other | Admitting: Cardiology

## 2015-02-27 VITALS — BP 138/80 | HR 64 | Ht 68.0 in | Wt 167.0 lb

## 2015-02-27 DIAGNOSIS — I2510 Atherosclerotic heart disease of native coronary artery without angina pectoris: Secondary | ICD-10-CM

## 2015-02-27 NOTE — Progress Notes (Signed)
HPI The patient presents for followup of his known coronary disease.  At the last visit he was having some chest discomfort. However, stress perfusion study demonstrated an EF of 60% with no ischemia. He returns for follow-up.  He's not having any new chest pressure, neck or arm discomfort. He is not having any new shortness of breath, PND or orthopnea. However, he has some muscle aches which precludes him from exercising. He thinks this could be related to his statin.  Allergies  Allergen Reactions  . Metoclopramide Hcl Hives    Patient became very spastic  . Iodinated Diagnostic Agents     Rash, nausea and flushing  . Iohexol      Desc: HIVES,SOB NEEDS PREP.MEDS   . Sulfonamide Derivatives     Large hives  . Penicillins Rash    Other Reaction: Not Assessed  . Sulfa Antibiotics Rash    Other Reaction: Not Assessed    Current Outpatient Prescriptions  Medication Sig Dispense Refill  . clopidogrel (PLAVIX) 75 MG tablet Take 75 mg by mouth daily.     . diazepam (VALIUM) 10 MG tablet Take 10 mg by mouth. As Needed    . enalapril (VASOTEC) 10 MG tablet Take 10 mg by mouth 2 (two) times daily.    Marland Kitchen gemfibrozil (LOPID) 600 MG tablet Take 300 mg by mouth daily.     Marland Kitchen glipiZIDE (GLUCOTROL) 10 MG tablet Take 5 mg by mouth 2 (two) times daily.     . hydrocodone-acetaminophen (LORCET PLUS) 7.5-650 MG per tablet Take 1 tablet by mouth every 6 (six) hours as needed.      . insulin glargine (LANTUS) 100 UNIT/ML injection Inject 50 Units into the skin at bedtime.    . metFORMIN (GLUCOPHAGE) 1000 MG tablet Take 1,000 mg by mouth 2 (two) times daily with a meal.     . metoprolol (LOPRESSOR) 100 MG tablet Take 25 mg by mouth 2 (two) times daily.     . naproxen (NAPROSYN) 500 MG tablet     . nitroGLYCERIN (NITROSTAT) 0.4 MG SL tablet Place 0.4 mg under the tongue every 5 (five) minutes as needed.      . NON FORMULARY Domperidone 10 mg Patient takes 2 by mouth daily    . OVER THE COUNTER MEDICATION  Patient is taking Pancreatic Enzymes 4X -Amylase 50,000 , Lipase 4000,Protease 50 ,000 , Pancreatic Enzymes 4x 50 mg Patient takes 2 by mouth daily.    . pantoprazole (PROTONIX) 40 MG tablet TAKE 1 TABLET BY MOUTH DAILY. 30 tablet 1  . PARoxetine (PAXIL) 10 MG tablet Take 10 mg by mouth daily.      . pravastatin (PRAVACHOL) 40 MG tablet Take 80 mg by mouth daily. Takes a whole tablet at bedtime and 1/4 tab in am    . promethazine (PHENERGAN) 25 MG tablet Take 25 mg by mouth as needed.       No current facility-administered medications for this visit.    Past Medical History  Diagnosis Date  . Coronary artery disease     Patent Stent to Circ.  Patent coronary bypass grafts with a free radial graft to PDA and LIMA to the  diag.  . Diabetes mellitus   . Pancreatitis chronic     Pseudocyst  . Dyslipidemia     Poorly controlled, probably related to his DM (Some of this is secondary to his inability to afford medications).  . ED (erectile dysfunction)   . Bypass graft stenosis     '  06  . Metaplasia of esophagus   . Chronic gastritis   . Esophageal yeast infection   . Polycythemia vera(238.4) 04/26/2012    Past Surgical History  Procedure Laterality Date  . Appendectomy    . Cholecystectomy    . Knee surgery      left knee  . Nasal septum surgery    . Finger tendon repair      in middle finger  . Coronary artery bypass graft  11/24/2004  . Cardiac catheterization    . Esophagogastroduodenoscopy  09/19/01  . Colonoscopy  07/31/1985  . Coronary angioplasty with stent placement  2006    ROS  Recent poison ivy.  Otherwise as stated in the HPI and negative for all other systems.  PHYSICAL EXAM BP 138/80 mmHg  Pulse 64  Ht 5\' 8"  (1.727 m)  Wt 167 lb (75.751 kg)  BMI 25.40 kg/m2 GENERAL:  Well appearing NECK:  No jugular venous distention, waveform within normal limits, carotid upstroke brisk and symmetric, no bruits, no thyromegaly LUNGS:  Clear to auscultation  bilaterally BACK:  No CVA tenderness CHEST:  Well healed sternotomy scar, soreness to palpation reproducing his symptoms as described above HEART:  PMI not displaced or sustained,S1 and S2 within normal limits, no S3, no S4, no clicks, no rubs, no murmurs ABD:  Flat, positive bowel sounds normal in frequency in pitch, no bruits, no rebound, no guarding, no midline pulsatile mass, no hepatomegaly, no splenomegaly EXT:  2 plus pulses throughout, no edema, no cyanosis no clubbing   ASSESSMENT AND PLAN  CAD: He has had no new symptoms.  He had a negative stress test in March.  No change in therapy or further testing is indicated.   HTN:  The blood pressure is at target. No change in medications is indicated. We will continue with therapeutic lifestyle changes (TLC).  HYPERLIPIDEMIA: Given his muscle aches he will give himself a month off of pravastatin to see if he gets better. He will rechallenge himself. We ultimately proved he's intolerant to this he might need to be considered for a PCSK9  DM:  He says his sugar was somewhat probably worse. He just had it checked as he was taking steroid-induced. I will defer to Robert Bellow, MD

## 2015-02-27 NOTE — Patient Instructions (Signed)
Your physician wants you to follow-up in: 1 Year You will receive a reminder letter in the mail two months in advance. If you don't receive a letter, please call our office to schedule the follow-up appointment.  Your physician has recommended you make the following change in your medication: STOP Pravastatin for 1 Month and see if you gets better

## 2015-04-21 ENCOUNTER — Telehealth: Payer: Self-pay | Admitting: Cardiology

## 2015-04-21 NOTE — Telephone Encounter (Signed)
[name] is calling because he wanted to let Dr. Percival Spanish know that you doing better without taking the pravastatin, and is there an alternative in which he can take . Please call   Thanks

## 2015-04-21 NOTE — Telephone Encounter (Signed)
Per Dr. Rosezella [place] note, if intolerant to pravastatin, may consider PCSK-9  Routed to MD to advise

## 2015-04-22 NOTE — Telephone Encounter (Signed)
Spoke with patient and he reports he is going to have his cholesterol checked by PCP this week. Advised that PCP send results to Dr. Percival Spanish to review.   Patient will call by end of next week if he has not heard from our office about labs.   Message routed to [facility] as [place]  Lipid clinic referral pending - based on labs per patient request

## 2015-04-22 NOTE — Telephone Encounter (Signed)
Refer to Lipid clinic to consider PCSK9

## 2015-05-07 ENCOUNTER — Encounter: Payer: Self-pay | Admitting: Cardiology

## 2015-05-12 ENCOUNTER — Ambulatory Visit (INDEPENDENT_AMBULATORY_CARE_PROVIDER_SITE_OTHER): Payer: Medicare Other | Admitting: Internal Medicine

## 2015-05-12 ENCOUNTER — Telehealth: Payer: Self-pay | Admitting: Cardiology

## 2015-05-12 ENCOUNTER — Encounter (INDEPENDENT_AMBULATORY_CARE_PROVIDER_SITE_OTHER): Payer: Self-pay | Admitting: Internal Medicine

## 2015-05-12 VITALS — BP 102/52 | HR 60 | Temp 98.1°F | Ht 69.0 in | Wt 166.8 lb

## 2015-05-12 DIAGNOSIS — K859 Acute pancreatitis, unspecified: Secondary | ICD-10-CM | POA: Diagnosis not present

## 2015-05-12 DIAGNOSIS — K858 Other acute pancreatitis without necrosis or infection: Secondary | ICD-10-CM

## 2015-05-12 NOTE — Telephone Encounter (Signed)
Pt says he is waiting to hear about what medicine have been decided for his cholesterol. His results were coming from Dr Karie Kirks.

## 2015-05-12 NOTE — Progress Notes (Signed)
Subjective:    Patient ID: [name], male    DOB: [birth year], 54 y.o.   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 He was last seen in June of 2015. Weight in June of 2015 168. Today his weight is 166.8. States recent triglycerides 2 weeks ago were greater than 800. Pravachol is on hold.  He says he has abdominal the time. He describes and cramps. He usually has 3 stools a day. He may skip a day without having a BM. Most of his stools are formed. Sometimes he is constipated. He does have some soft stools.  Last HA1C 9.3.  He does have some nausea but no vomiting.  Hx of DM since 2001.  04/30/2015 Cholesterol 264, Triglycerides 853.  Review of Systems Past Medical History  Diagnosis Date  . Coronary artery disease     Patent Stent to Circ.  Patent coronary bypass grafts with a free radial graft to PDA and LIMA to the  diag.  . Diabetes mellitus   . Pancreatitis chronic     Pseudocyst  . Dyslipidemia     Poorly controlled, probably related to his DM (Some of this is secondary to his inability to afford medications).  . ED (erectile dysfunction)   . 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 Metaplasia of esophagus   . Chronic gastritis   . Esophageal yeast infection   . Polycythemia vera(238.4) 04/26/2012    Past Surgical History  Procedure Laterality Date  . Appendectomy    . Cholecystectomy    . Knee surgery      left knee  . Nasal septum surgery    . Finger tendon repair      in middle finger  . Coronary artery bypass graft  11/24/2004  . Cardiac catheterization    . Esophagogastroduodenoscopy  09/19/01  . Colonoscopy  07/31/1985  . Coronary angioplasty with stent placement  2006    Allergies  Allergen Reactions  . Metoclopramide Hcl Hives    Patient became very spastic  . Iodinated Diagnostic Agents     Rash, nausea and flushing  . Iohexol      Desc: HIVES,SOB NEEDS PREP.MEDS   . Sulfonamide  Derivatives     Large hives  . Penicillins Rash    Other Reaction: Not Assessed  . Sulfa Antibiotics Rash    Other Reaction: Not Assessed    Current Outpatient Prescriptions on File Prior to Visit  Medication Sig Dispense Refill  . clopidogrel (PLAVIX) 75 MG tablet Take 75 mg by mouth daily.     . diazepam (VALIUM) 10 MG tablet Take 10 mg by mouth. As Needed    . enalapril (VASOTEC) 10 MG tablet Take 10 mg by mouth 2 (two) times daily.    Marland Kitchen gemfibrozil (LOPID) 600 MG tablet Take 300 mg by mouth daily.     Marland Kitchen glipiZIDE (GLUCOTROL) 10 MG tablet Take 10 mg by mouth 2 (two) times daily.     . hydrocodone-acetaminophen (LORCET PLUS) 7.5-650 MG per tablet Take 1 tablet by mouth every 6 (six) hours as needed.      . insulin glargine (LANTUS) 100 UNIT/ML injection Inject 50 Units into the skin at bedtime.    . metFORMIN (GLUCOPHAGE) 1000 MG tablet Take 1,000 mg by mouth 2 (two) times daily with a meal.     . metoprolol (LOPRESSOR) 100 MG tablet Take 25 mg by mouth 2 (two) times  daily.     . naproxen (NAPROSYN) 500 MG tablet     . nitroGLYCERIN (NITROSTAT) 0.4 MG SL tablet Place 0.4 mg under the tongue every 5 (five) minutes as needed.      . NON FORMULARY Domperidone 10 mg Patient takes 2 by mouth daily    . OVER THE COUNTER MEDICATION Patient is taking Pancreatic Enzymes 4X -Amylase 50,000 , Lipase 4000,Protease 50 ,000 , Pancreatic Enzymes 4x 50 mg Patient takes 2 by mouth daily.    . pantoprazole (PROTONIX) 40 MG tablet TAKE 1 TABLET BY MOUTH DAILY. 30 tablet 1  . PARoxetine (PAXIL) 10 MG tablet Take 10 mg by mouth daily.      . promethazine (PHENERGAN) 25 MG tablet Take 25 mg by mouth as needed.       No current facility-administered medications on file prior to visit.        Objective:   Physical ExamBlood pressure 102/52, pulse 60, temperature 98.1 F (36.7 C), height 5\' 9"  (1.753 m), weight 166 lb 12.8 oz (75.66 kg). Alert and oriented. Skin warm and dry. Oral mucosa is moist.   .  Sclera anicteric, conjunctivae is pink. Thyroid not enlarged. No cervical lymphadenopathy. Lungs clear. Heart regular rate and rhythm.  Abdomen is soft. Bowel sounds are positive. No hepatomegaly. No abdominal masses felt. No tenderness.  No edema to lower extremities.          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 Last CT was in August 2013 revealing small cyst in pancreatic tail. Since has actually decreased in size on sequential CT scans. He says he is watching his diet to keep his triglycerides down.  Hx of gastroparesis: Continue the Domperidone.  Ov in 1 year.  Gastroparesis secondary to diabetes medicine medications. Presently taking Domperidone for this.

## 2015-05-12 NOTE — Patient Instructions (Signed)
OV in 1 year.  

## 2015-05-12 NOTE — Telephone Encounter (Signed)
Patient concerned about triglycerindes being so high  Thinking he needs to be on something to bring it down.  Labs from Dr. Karie Kirks are scanned under "media" tab  Waiting to hear about what medicine for cholesterol

## 2015-05-15 NOTE — Telephone Encounter (Signed)
I have scheduled an appointment with Erasmo Downer for the [facility] for 9/23 at 2:50pm

## 2015-05-16 ENCOUNTER — Encounter (INDEPENDENT_AMBULATORY_CARE_PROVIDER_SITE_OTHER): Payer: Self-pay

## 2015-05-16 ENCOUNTER — Telehealth: Payer: Self-pay | Admitting: Pharmacist Clinician (PhC)/ Clinical Pharmacy Specialist

## 2015-05-16 ENCOUNTER — Ambulatory Visit: Payer: Medicare Other | Admitting: Pharmacist Clinician (PhC)/ Clinical Pharmacy Specialist

## 2015-05-16 MED ORDER — FENOFIBRATE 160 MG PO TABS: ORAL_TABLET | ORAL | Status: DC

## 2015-05-16 NOTE — Telephone Encounter (Signed)
Patient with history of mixed hyperlipidemia, now with TG 853 and pancreatitis.  Has previously taken pravastatin 40 mg and gemfibrozil 300 mg bid, but developed myalgias and stopped about 1 month prior to most recent lab draw.  Currently only taking gemfibrozil 300 mg bid.  He cannot tolerate the 600 mg dose.    Spent 20 minutes on the phone with patient.  He is agreeable to d/c gemfibrozil and start fenofibrate 160 mg once daily with food.  After 2-3 weeks, if he is doing well, we will add a low dose rosuvastatin, probably 10 mg tiw.  I have asked him to work with his PCP on getting his A1c down, as his most recent reading was 9.3.    I will call him in 2 weeks, if doing well, will add the rosuvastatin.

## 2015-06-02 ENCOUNTER — Telehealth: Payer: Self-pay | Admitting: Pharmacist Clinician (PhC)/ Clinical Pharmacy Specialist

## 2015-06-02 MED ORDER — NIACIN ER (ANTIHYPERLIPIDEMIC) 1000 MG PO TBCR: 1000.0000 mg | EXTENDED_RELEASE_TABLET | Freq: Every day | ORAL | Status: DC

## 2015-06-02 NOTE — Telephone Encounter (Signed)
Pt called in wanting to speak with  [name] about his progress with the Fenofibrate that had been prescribed for him to bring down his tryglyceride levels down. Please f/u with him  Thanks

## 2015-06-02 NOTE — Telephone Encounter (Signed)
Spoke with patient.  He states he has had muscle swelling in his left chest cavity and legs and stomach upset.  He states this is similar to what he experienced in the past with fenofibrate.  He does not want to take any more of this.  Suggested patient start Niacin 1000mg  daily.  He is agreeable to this.  Will send in an rx.  Pt will need lipid/liver checked within 2 months.

## 2015-06-10 ENCOUNTER — Telehealth: Payer: Self-pay | Admitting: Pharmacist Clinician (PhC)/ Clinical Pharmacy Specialist

## 2015-06-10 NOTE — Telephone Encounter (Signed)
Spoke with patient, switched last week to Niacin 1 gm daily.  Had some hot flashes, but nothing severe or to want him to stop medication.  Still having pancreatic problems, followed by GI.  Is also trying to stay on a healthy diet to get trigs down.  Advised that he repeat labs before Christmas.  He normally has labs done by PCP, will go there.

## 2015-06-10 NOTE — Telephone Encounter (Signed)
-----   Message from Rockne Menghini, [place] sent at 05/16/2015  5:02 PM EDT ----- Call to see if tolerating fenofibrate

## 2015-07-03 ENCOUNTER — Telehealth: Payer: Self-pay | Admitting: Pharmacist Clinician (PhC)/ Clinical Pharmacy Specialist

## 2015-07-03 NOTE — Telephone Encounter (Signed)
Pt LMOM with concerns about niaspan.    Returned call, pt reports having night sweats from Niaspan, has been unable to control with aspirin or adjusting his diet.  Also recently starting to itch, believes this is the cause.   States he took until yesterday, even though it was causing him these problems, as he had labs drawn and wanted to see what triglycerides were.  Had labs drawn at PCP, asked that he forward cholesterol labs when available.    Discussed option of Vascepa or Lovaza.  He was asking about an internet ordering of fish oil, was told it was "one of the most pure and best".  Advised that we would prefer to use prescription.  He states daughter took generic Lovaza, but it did not work as well as the brand.  I suggested that we hold off on any medications until we see what his triglycerides are.  At that time we can decide on which omega 3 to use.  Patient voiced understanding, will forward labs to [place] ASAP.

## 2015-07-10 ENCOUNTER — Encounter: Payer: Self-pay | Admitting: Cardiology

## 2015-07-14 ENCOUNTER — Other Ambulatory Visit: Payer: Self-pay | Admitting: Pharmacist Clinician (PhC)/ Clinical Pharmacy Specialist

## 2015-07-14 ENCOUNTER — Telehealth: Payer: Self-pay | Admitting: Pharmacist Clinician (PhC)/ Clinical Pharmacy Specialist

## 2015-07-14 DIAGNOSIS — E785 Hyperlipidemia, unspecified: Secondary | ICD-10-CM

## 2015-07-14 MED ORDER — OMEGA-3-ACID ETHYL ESTERS 1 G PO CAPS: 2.0000 g | ORAL_CAPSULE | Freq: Two times a day (BID) | ORAL | Status: DC

## 2015-07-14 NOTE — Telephone Encounter (Signed)
Pt with TG 988, has failed fenofibrate and niaspan.  Will now try lovaza 2 gm bid.  Pt to repeat labs in 2 months

## 2016-01-08 ENCOUNTER — Other Ambulatory Visit (INDEPENDENT_AMBULATORY_CARE_PROVIDER_SITE_OTHER): Payer: Self-pay | Admitting: Internal Medicine

## 2016-01-08 ENCOUNTER — Other Ambulatory Visit (INDEPENDENT_AMBULATORY_CARE_PROVIDER_SITE_OTHER): Payer: Self-pay | Admitting: *Deleted

## 2016-01-08 ENCOUNTER — Encounter (INDEPENDENT_AMBULATORY_CARE_PROVIDER_SITE_OTHER): Payer: Self-pay | Admitting: Internal Medicine

## 2016-01-08 ENCOUNTER — Ambulatory Visit (INDEPENDENT_AMBULATORY_CARE_PROVIDER_SITE_OTHER): Payer: Medicare Other | Admitting: Internal Medicine

## 2016-01-08 ENCOUNTER — Encounter (INDEPENDENT_AMBULATORY_CARE_PROVIDER_SITE_OTHER): Payer: Self-pay | Admitting: *Deleted

## 2016-01-08 ENCOUNTER — Encounter (INDEPENDENT_AMBULATORY_CARE_PROVIDER_SITE_OTHER): Payer: Self-pay

## 2016-01-08 VITALS — BP 126/86 | HR 64 | Temp 98.7°F | Ht 68.5 in | Wt 165.5 lb

## 2016-01-08 DIAGNOSIS — D509 Iron deficiency anemia, unspecified: Secondary | ICD-10-CM | POA: Diagnosis not present

## 2016-01-08 DIAGNOSIS — R195 Other fecal abnormalities: Secondary | ICD-10-CM

## 2016-01-08 MED ORDER — PEG 3350-KCL-NA BICARB-NACL 420 G PO SOLR: 4000.0000 mL | Freq: Once | ORAL | Status: DC

## 2016-01-08 NOTE — Progress Notes (Signed)
Subjective:    Patient ID: [name], male    DOB: 30-Jun-1961, 55 y.o.   MRN: [id number]  HPI Referred to our office by Dr. Karie Kirks for IDA. Noted on recent labs iron and ferritin low.  He denies prior hx of anemia. He has not seen any change in his stools. He says Dr. Karie Kirks checked his stool x 2 and it was negative. He says he has had some dark stools maybe the first of this week. His appetite is good for the most part. No weight. His last colonoscopy was greater than 10 yrs and says he had 2 pseudocysts.   Receives phlebotomy ever 6-8 weeks for polycythemia vera. Followed at the [place] [facility] [place].  Last phlebotomy was 8 weeks ago. ( 11/07/2015)  Hx significant for CAD and has a stent. Maintained on Plavix.  Hx chronic pancreatitis with history of pseudocyst secondary to hypertriglyceridemia      11/03/2014 H and H 12.9 and 74.2 Iron 44, IBC 427, % Saturation 10, Ferritin 9. Vitamin B 12 491, Folate 18.1 10/28/2015 H and H 13.5 and 43.3, MCV 72.7 10/29/2015 Triglycerides 234  Review of Systems Past Medical History  Diagnosis Date  . Coronary artery disease     Patent Stent to Circ.  Patent coronary bypass grafts with a free radial graft to PDA and LIMA to the  diag.  . Diabetes mellitus   . Pancreatitis chronic     Pseudocyst  . Dyslipidemia     Poorly controlled, probably related to his DM (Some of this is secondary to his inability to afford medications).  . ED (erectile dysfunction)   . Bypass graft stenosis ([place])     '06  . Metaplasia of esophagus   . Chronic gastritis   . Esophageal yeast infection ([place])   . Polycythemia vera(238.4) 04/26/2012  . Anemia     Past Surgical History  Procedure Laterality Date  . Appendectomy    . Cholecystectomy    . Knee surgery      left knee  . Nasal septum surgery    . Finger tendon repair      in middle finger  . Coronary artery bypass graft  11/24/2004  . Cardiac catheterization    . Esophagogastroduodenoscopy   09/19/01  . Colonoscopy  07/31/1985  . Coronary angioplasty with stent placement  2006    Allergies  Allergen Reactions  . Metoclopramide Hcl Hives    Patient became very spastic  . Iodinated Diagnostic Agents     Rash, nausea and flushing  . Iohexol      Desc: HIVES,SOB NEEDS PREP.MEDS   . Sulfonamide Derivatives     Large hives  . Penicillins Rash    Other Reaction: Not Assessed  . Sulfa Antibiotics Rash    Other Reaction: Not Assessed    Current Outpatient Prescriptions on File Prior to Visit  Medication Sig Dispense Refill  . clopidogrel (PLAVIX) 75 MG tablet Take 75 mg by mouth daily.     . diazepam (VALIUM) 10 MG tablet Take 10 mg by mouth. As Needed    . enalapril (VASOTEC) 10 MG tablet Take 10 mg by mouth 2 (two) times daily.    Marland Kitchen glipiZIDE (GLUCOTROL) 10 MG tablet Take 10 mg by mouth 2 (two) times daily.     . hydrocodone-acetaminophen (LORCET PLUS) 7.5-650 MG per tablet Take 1 tablet by mouth every 6 (six) hours as needed.      . insulin glargine (LANTUS) 100 UNIT/ML injection  Inject 50 Units into the skin at bedtime.    . metFORMIN (GLUCOPHAGE) 1000 MG tablet Take 1,000 mg by mouth 2 (two) times daily with a meal.     . metoprolol (LOPRESSOR) 100 MG tablet Take 25 mg by mouth 2 (two) times daily.     . naproxen (NAPROSYN) 500 MG tablet as needed.     . nitroGLYCERIN (NITROSTAT) 0.4 MG SL tablet Place 0.4 mg under the tongue every 5 (five) minutes as needed.      . NON FORMULARY Domperidone 10 mg Patient takes 2 by mouth daily    . omega-3 acid ethyl esters (LOVAZA) 1 G capsule Take 2 capsules (2 g total) by mouth 2 (two) times daily. 120 capsule 5  . OVER THE COUNTER MEDICATION Patient is taking Pancreatic Enzymes 4X -Amylase 50,000 , Lipase 4000,Protease 50 ,000 , Pancreatic Enzymes 4x 50 mg Patient takes 2 by mouth daily.    . pantoprazole (PROTONIX) 40 MG tablet TAKE 1 TABLET BY MOUTH DAILY. (Patient taking differently: as needed) 30 tablet 1  . PARoxetine (PAXIL)  10 MG tablet Take 10 mg by mouth daily.      . promethazine (PHENERGAN) 25 MG tablet Take 25 mg by mouth as needed.       No current facility-administered medications on file prior to visit.        Objective:   Physical Exam Blood pressure 126/86, pulse 64, temperature 98.7 F (37.1 C), height 5' 8.5" (1.74 m), weight 165 lb 8 oz (75.07 kg).  Alert and oriented. Skin warm and dry. Oral mucosa is moist.   . Sclera anicteric, conjunctivae is pink. Thyroid not enlarged. No cervical lymphadenopathy. Lungs clear. Heart regular rate and rhythm.  Abdomen is soft. Bowel sounds are positive. No hepatomegaly. No abdominal masses felt. No tenderness.  No edema to lower extremities.   Stool brown and guaiac positive.     Lot [id number] Ex 9/17    Assessment & Plan:  IDA. Colonic neoplasm needs to be ruled out. EGD for possible PUD.  The risks and benefits such as perforation, bleeding, and infection were reviewed with the patient and is agreeable.

## 2016-01-08 NOTE — Telephone Encounter (Signed)
Patient needs trilyte 

## 2016-01-08 NOTE — Patient Instructions (Signed)
EGD/Colonoscopy. The risks and benefits such as perforation, bleeding, and infection were reviewed with the patient and is agreeable. 

## 2016-01-09 ENCOUNTER — Telehealth: Payer: Self-pay | Admitting: Cardiology

## 2016-01-09 NOTE — Telephone Encounter (Signed)
New message      Request for surgical clearance:  What type of surgery is being performed? Colonoscopy and endoscopy When is this surgery scheduled?03-11-16 1. Are there any medications that need to be held prior to surgery and how long? Hold plavix 5 day prior  Name of physician performing surgery?  Dr Laural Golden 2. What is your office phone and fax number?  Fax [phone] or ok to send thru epic

## 2016-01-09 NOTE — Telephone Encounter (Signed)
Please advise. Routing to Dr Percival Spanish and Meredith Pel.

## 2016-01-11 NOTE — Telephone Encounter (Signed)
He should see me before this is done.  He would be due for follow up in late June or early July.  We will call to schedule.

## 2016-01-12 ENCOUNTER — Other Ambulatory Visit: Payer: Self-pay | Admitting: *Deleted

## 2016-01-13 ENCOUNTER — Telehealth (INDEPENDENT_AMBULATORY_CARE_PROVIDER_SITE_OTHER): Payer: Self-pay | Admitting: *Deleted

## 2016-01-13 NOTE — Telephone Encounter (Signed)
Spoke with pt letting him know about his appt time and date, and the reason for his visit on June 23rd, pt appreciate my call and all clarification.[name]

## 2016-01-13 NOTE — Telephone Encounter (Signed)
Spoke to patient and he said he was not trying to be upset but there is a few of things he needed answers to.  1  He saw Terri last week and she told him to stop giving blood  Now he did have heme + stool in office  But he also has polycythemia (not sure I spelled correctly)  He spoke to Dr. Tressie Stalker and he is seeing him on 01/28/2016 and asked that he call and talk to you.  2  He said he didn't remember telling anyone who he saw for Plavix monitoring and said that he didn't tell anyone they could call Dr. Casimer Lanius   3  He was started on iron last week and feels better   4  He feels he is having night sweats and itching from not giving blood because of his condition.  He was at first a little ill talking but I asked that he give me a chance to tell him why I was calling and he was OK.  He spoke very calm and I thought he was just distraught about wanting to have your opinion on some things.  He did not bad mouth Terri but felt this needed to come from you on stopping the blood.  I think he is just concerned about his condition and needs some reassurance .  I explain the situation of blood thinner and procedures and he understood but just was not told we would be calling his cardiologist --told him we did not know their protocols and this was not a call we could make ourselves.  Call ended very cordial and he was appreciative that I called him to reassure someone (Dr. Laural Golden)  would call him once we got finished with schedule today.

## 2016-01-13 NOTE — Telephone Encounter (Signed)
FOLLOW UP   Pt is calling with some confusion  He do not know why he has an appt scheduled for Dr.hochrein and I asked him is he having a procedure done?   He said no Dr Laural Golden is going to do a Colonoscopy and Endoscopy and I said okay this appt is for that surgical clearance,  Most likley the office of that provider called for the surgical clearance and i will have rn call  Pt said he is confused and wants rn to call him to see who scheduled the appt and why

## 2016-01-13 NOTE — Telephone Encounter (Signed)
Pt have appt to see Dr Percival Spanish on June 23rd @ 2:45pm

## 2016-01-13 NOTE — Telephone Encounter (Signed)
Patient was seen by Terri and sch'd for TCS/EGD for IDA, he is on Plavix and I called cardiologist office and left message to see if ok to stop Plavix 5 days prior. I told the patient before he left that he would need to stop it 5 days but we would call his cardiologist to make sure if ok to stop. Apparently the cardiologist needed to see patient prior to making that decision so the cardiologist office called [name] today and "woke him up" to schedule this appt. Patient called our office wanting to know why his cardiologist called him 800 this morning about an appt. He initially asked for me but I was working with a patient so he asked Hope to give Dr Laural Golden, Karna Christmas or Tammy the message to call him. I was aware of what was going on and why he needed the cardiologist appt. I called [name] and tried to explain to [name]. He was very rude and wouldn't give me time to speak or explain anything. Stated he had asked for Dr Laural Golden, Karna Christmas or Tammy to call him not me he was angry that I had called him instead of Dr Laural Golden, Karna Christmas or Tammt. Stated he was "trying to control his anger". He was tired of getting the run a round, asked if needed to get Dr Karie Kirks involved in this. We shouldn't have made an appt with cardiologist without telling him. I tried to explain again about the appt with the cardiologist, patient told me to "stop right there", he was the patient and we should have called him before making the appt. Again I tried to explain we didn't make the appt, all we done was call to see if ok to stop Plavix but the cardiologist needed to see him before he would say if ok to stop Plavix and he wouldn't give me time to speak. He finally said Dr Laural Golden, Karna Christmas or Tammy could call him and hung up on me.

## 2016-01-15 NOTE — Telephone Encounter (Signed)
Talked with patient on 01/13/2016. He told me that he was not informed that cardiology will be consulted regarding clopidogrel. He stated he did not mean to be rude; he was just upset. He will stop by the office to resolve issue with Mrs. Wynonia Lawman. He will have H&H checked at oncology clinic. If hemoglobin is lower than 12.9 g Will plan EGD earlier than in July 2017.

## 2016-02-12 NOTE — Progress Notes (Signed)
No show  This encounter was created in error - please disregard.

## 2016-02-13 ENCOUNTER — Encounter: Payer: Medicare Other | Admitting: Cardiology

## 2016-02-17 ENCOUNTER — Ambulatory Visit (INDEPENDENT_AMBULATORY_CARE_PROVIDER_SITE_OTHER): Payer: Medicare Other | Admitting: Cardiology

## 2016-02-17 ENCOUNTER — Encounter: Payer: Self-pay | Admitting: Cardiology

## 2016-02-17 VITALS — BP 140/90 | HR 62 | Ht 69.0 in | Wt 165.4 lb

## 2016-02-17 DIAGNOSIS — I10 Essential (primary) hypertension: Secondary | ICD-10-CM | POA: Diagnosis not present

## 2016-02-17 DIAGNOSIS — I2510 Atherosclerotic heart disease of native coronary artery without angina pectoris: Secondary | ICD-10-CM

## 2016-02-17 NOTE — Progress Notes (Signed)
HPI The patient presents for followup of his known coronary disease.  At the last visit he was having some chest discomfort. However, stress perfusion study demonstrated an EF of 60% with no ischemia. He returns for follow-up.  He is to have a colonoscopy done soon and EGD with esophageal stretching.   He has been having problems with his prostate and probably has an ongoing infection.  He has not had any overt cardiology complaints.  He did have one episode of chest pain about 4 weeks ago.  He otherwise has not had any chest pain.  He has not required any NTG.  The patient denies any new symptoms such as chest discomfort, neck or arm discomfort. There has been no new shortness of breath, PND or orthopnea. There have been no reported palpitations, presyncope or syncope.   Allergies  Allergen Reactions  . Metoclopramide Hcl Hives    Patient became very spastic  . Iodinated Diagnostic Agents     Rash, nausea and flushing  . Iohexol      Desc: HIVES,SOB NEEDS PREP.MEDS   . Sulfonamide Derivatives     Large hives  . Penicillins Rash    Other Reaction: Not Assessed  . Sulfa Antibiotics Rash    Other Reaction: Not Assessed    Current Outpatient Prescriptions  Medication Sig Dispense Refill  . clopidogrel (PLAVIX) 75 MG tablet Take 75 mg by mouth daily.     . diazepam (VALIUM) 10 MG tablet Take 10 mg by mouth. As Needed    . enalapril (VASOTEC) 10 MG tablet Take 10 mg by mouth 2 (two) times daily.    Marland Kitchen gabapentin (NEURONTIN) 100 MG capsule Take 100 mg by mouth 2 (two) times daily.    Marland Kitchen glipiZIDE (GLUCOTROL) 10 MG tablet Take 10 mg by mouth 2 (two) times daily.     . hydrocodone-acetaminophen (LORCET PLUS) 7.5-650 MG per tablet Take 1 tablet by mouth every 6 (six) hours as needed.      . insulin glargine (LANTUS) 100 UNIT/ML injection Inject 50 Units into the skin at bedtime.    . metFORMIN (GLUCOPHAGE) 1000 MG tablet Take 1,000 mg by mouth 2 (two) times daily with a meal.     . metoprolol  (LOPRESSOR) 100 MG tablet Take 25 mg by mouth 2 (two) times daily.     . naproxen (NAPROSYN) 500 MG tablet as needed.     . nitroGLYCERIN (NITROSTAT) 0.4 MG SL tablet Place 0.4 mg under the tongue every 5 (five) minutes as needed.      . NON FORMULARY Domperidone 10 mg Patient takes 2 by mouth daily    . omega-3 acid ethyl esters (LOVAZA) 1 G capsule Take 2 capsules (2 g total) by mouth 2 (two) times daily. 120 capsule 5  . ondansetron (ZOFRAN) 4 MG tablet Take 4 mg by mouth every 8 (eight) hours as needed for nausea or vomiting.    Marland Kitchen OVER THE COUNTER MEDICATION Patient is taking Pancreatic Enzymes 4X -Amylase 50,000 , Lipase 4000,Protease 50 ,000 , Pancreatic Enzymes 4x 50 mg Patient takes 2 by mouth daily.    . pantoprazole (PROTONIX) 40 MG tablet TAKE 1 TABLET BY MOUTH DAILY. (Patient taking differently: as needed) 30 tablet 1  . PARoxetine (PAXIL) 10 MG tablet Take 10 mg by mouth daily.      . polyethylene glycol-electrolytes (NULYTELY/GOLYTELY) 420 g solution Take 4,000 mLs by mouth once. 4000 mL 0  . promethazine (PHENERGAN) 25 MG tablet Take 25 mg by  mouth as needed.       No current facility-administered medications for this visit.    Past Medical History  Diagnosis Date  . Coronary artery disease     Patent Stent to Circ.  Patent coronary bypass grafts with a free radial graft to PDA and LIMA to the  diag.  . Diabetes mellitus   . Pancreatitis chronic     Pseudocyst  . Dyslipidemia     Poorly controlled, probably related to his DM (Some of this is secondary to his inability to afford medications).  . ED (erectile dysfunction)   . 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 Metaplasia of esophagus   . Chronic gastritis   . Esophageal yeast infection ([place])   . Polycythemia vera(238.4) 04/26/2012  . Anemia     Past Surgical History  Procedure Laterality Date  . Appendectomy    . Cholecystectomy    . Knee surgery      left knee  . Nasal septum surgery    . Finger tendon repair       in middle finger  . Coronary artery bypass graft  11/24/2004  . Cardiac catheterization    . Esophagogastroduodenoscopy  09/19/01  . Colonoscopy  07/31/1985  . Coronary angioplasty with stent placement  2006    ROS  Recent poison ivy.  Otherwise as stated in the HPI and negative for all other systems.  PHYSICAL EXAM BP 140/90 mmHg  Pulse 62  Ht 5\' 9"  (1.753 m)  Wt 165 lb 6.4 oz (75.025 kg)  BMI 24.41 kg/m2  SpO2 98% GENERAL:  Well appearing NECK:  No jugular venous distention, waveform within normal limits, carotid upstroke brisk and symmetric, no bruits, no thyromegaly LUNGS:  Clear to auscultation bilaterally BACK:  No CVA tenderness CHEST:  Well healed sternotomy scar, soreness to palpation reproducing his symptoms as described above HEART:  PMI not displaced or sustained,S1 and S2 within normal limits, no S3, no S4, no clicks, no rubs, no murmurs ABD:  Flat, positive bowel sounds normal in frequency in pitch, no bruits, no rebound, no guarding, no midline pulsatile mass, no hepatomegaly, no splenomegaly EXT:  2 plus pulses throughout, no edema, no cyanosis no clubbing  EKG:  NSR ,rate 62, axis WNL, intervals WNL, no acute ST T wave changes.   ASSESSMENT AND PLAN  CAD: He has had no new symptoms.  He had a negative stress test in March of last year.  No change in therapy or further testing is indicated.   If it is absolutely necessary he can hold his Plavix for 5 days prior to his GI procedures.    HTN:  The blood pressure is at target. No change in medications is indicated. We will continue with therapeutic lifestyle changes (TLC).  HYPERLIPIDEMIA:   I will send him back to our Lipid Clinic to consider PCSK9.  He is intolerant of statin.  Of note his LDL was 110 recently.    DM:  A1C was 9.1   I will defer to  Robert Bellow, MD

## 2016-02-17 NOTE — Patient Instructions (Signed)
Your physician wants you to follow-up in: 1 Year. You will receive a reminder letter in the mail two months in advance. If you don't receive a letter, please call our office to schedule the follow-up appointment.  

## 2016-02-26 ENCOUNTER — Encounter: Payer: Self-pay | Admitting: Pharmacist

## 2016-02-26 ENCOUNTER — Ambulatory Visit (INDEPENDENT_AMBULATORY_CARE_PROVIDER_SITE_OTHER): Payer: Medicare Other | Admitting: Pharmacist

## 2016-02-26 VITALS — Wt 169.8 lb

## 2016-02-26 DIAGNOSIS — E785 Hyperlipidemia, unspecified: Secondary | ICD-10-CM | POA: Diagnosis not present

## 2016-02-26 NOTE — Patient Instructions (Addendum)
Have labs later this week or early next week - these are fasting labs  If LDL remains elevated we will pursue option of Repatha.    Cholesterol Cholesterol is a white, waxy, fat-like substance needed by your body in small amounts. The liver makes all the cholesterol you need. Cholesterol is carried from the liver by the blood through the blood vessels. Deposits of cholesterol (plaque) may build up on blood vessel walls. These make the arteries narrower and stiffer. Cholesterol plaques increase the risk for heart attack and stroke.  You cannot feel your cholesterol level even if it is very high. The only way to know it is high is with a blood test. Once you know your cholesterol levels, you should keep a record of the test results. Work with your health care provider to keep your levels in the desired range.  WHAT DO THE RESULTS MEAN?  Total cholesterol is a rough measure of all the cholesterol in your blood.   LDL is the so-called bad cholesterol. This is the type that deposits cholesterol in the walls of the arteries. You want this level to be low.   HDL is the good cholesterol because it cleans the arteries and carries the LDL away. You want this level to be high.  Triglycerides are fat that the body can either burn for energy or store. High levels are closely linked to heart disease.  WHAT ARE THE DESIRED LEVELS OF CHOLESTEROL?  Total cholesterol below 200.   LDL below 100 for people at risk, below 70 for those at very high risk.   HDL above 50 is good, above 60 is best.   Triglycerides below 150.  HOW CAN I LOWER MY CHOLESTEROL?  Diet. Follow your diet programs as directed by your health care provider.   Choose fish or white meat chicken and [place], roasted or baked. Limit fatty cuts of red meat, fried foods, and processed meats, such as sausage and lunch meats.   Eat lots of fresh fruits and vegetables.  Choose whole grains, beans, pasta, potatoes, and cereals.    Use only small amounts of olive, corn, or canola oils.   Avoid butter, mayonnaise, shortening, or palm kernel oils.  Avoid foods with trans fats.   Drink skim or nonfat milk and eat low-fat or nonfat yogurt and cheeses. Avoid whole milk, cream, ice cream, egg yolks, and full-fat cheeses.   Healthy desserts include angel food cake, ginger snaps, animal crackers, hard candy, popsicles, and low-fat or nonfat frozen yogurt. Avoid pastries, cakes, pies, and cookies.   Exercise. Follow your exercise programs as directed by your health care provider.   A regular program helps decrease LDL and raise HDL.   A regular program helps with weight control.   Do things that increase your activity level like gardening, walking, or taking the stairs. Ask your health care provider about how you can be more active in your daily life.   Medicine. Take medicine only as directed by your health care provider.   Medicine may be prescribed by your health care provider to help lower cholesterol and decrease the risk for heart disease.   If you have several risk factors, you may need medicine even if your levels are normal.   This information is not intended to replace advice given to you by your health care provider. Make sure you discuss any questions you have with your health care provider.   Document Released: 05/04/2001 Document Revised: 08/30/2014 Document Reviewed: 05/23/2013 Elsevier Interactive  Patient Education 2016 [employer].

## 2016-02-26 NOTE — Progress Notes (Signed)
Patient ID: [name]                 DOB: 1960-12-14                    MRN: [id number]     HPI: [name] is a 55 y.o. male patient referred to lipid clinic by Dr. Percival Spanish for evaluation. He was started on Lovaza previously by [employer]. He reports that at his last labs he had only been on therapy for about 60 days. He was very pleased with his results after only being on therapy for 60 days so he is extremely hesitant to start another medication without ensuring he is not at goal on current therapy.   He states he has experienced myalgias on several different statin medications. He states the muscle pains were so severe he would lay on the floor in pain. He states these pains improved slowly after discontinuation of the drugs and have completely resolved now that he has been off statins.   Current Medications:  Lovaza 2g BID  Intolerances:  Mevacor, Zocor, pravastatin, Crestor X2, Atorvastatin, Fenofibrate, gemfibrozil - most of which were started by his primary care and were discontinued due to muscle pains.   Niacin - he could not tolerate flushing   Risk Factors: CAD, HLD, DM, Bypass graft stenosis  LDL goal: <70  Diet: He reports watching what he eats. He avoid breads and sweets due to his diabetes. He grills most of his meats and occasionally eats them baked. He does report indulging in chicken wings every Tuesday. He eats eggs and bacon for breakfast. When he does fry foods he uses low cholesterol butter or coconut oil. He has previously seen a nutrionalist as recommended by Dr. Percival Spanish.   Exercise: He has not been very active recently but does walk in backyard and occasionally swims laps in his pool  Family History: His mother passed away from a heart attack at 17yo. His father died of compartment syndrome. He has several brothers that have passed away one of whom he knows was cardiac related. He states his sister used to see Dr. Percival Spanish but he is not sure if  she has cardiac issues or not and his other brother has issues with cholesterol.   Social History: Denies tobacco and very rarely drinks a beer with his son-in-law.   Labs: 10/28/15 TC 201  HDL 35  TG 234  LDL 119  nonHDL 166   Past Medical History  Diagnosis Date  . Coronary artery disease     Patent Stent to Circ.  Patent coronary bypass grafts with a free radial graft to PDA and LIMA to the  diag.  . Diabetes mellitus   . Pancreatitis chronic     Pseudocyst  . Dyslipidemia     Poorly controlled, probably related to his DM (Some of this is secondary to his inability to afford medications).  . ED (erectile dysfunction)   . 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 Metaplasia of esophagus   . Chronic gastritis   . Esophageal yeast infection ([place])   . Polycythemia vera(238.4) 04/26/2012  . Anemia     Current Outpatient Prescriptions on File Prior to Visit  Medication Sig Dispense Refill  . clopidogrel (PLAVIX) 75 MG tablet Take 75 mg by mouth daily.     . diazepam (VALIUM) 10 MG tablet Take 10 mg by mouth. As Needed    . enalapril (VASOTEC) 10  MG tablet Take 10 mg by mouth 2 (two) times daily.    Marland Kitchen gabapentin (NEURONTIN) 100 MG capsule Take 100 mg by mouth 2 (two) times daily.    Marland Kitchen glipiZIDE (GLUCOTROL) 10 MG tablet Take 10 mg by mouth 2 (two) times daily.     . hydrocodone-acetaminophen (LORCET PLUS) 7.5-650 MG per tablet Take 1 tablet by mouth every 6 (six) hours as needed.      . insulin glargine (LANTUS) 100 UNIT/ML injection Inject 50 Units into the skin at bedtime.    . metFORMIN (GLUCOPHAGE) 1000 MG tablet Take 1,000 mg by mouth 2 (two) times daily with a meal.     . metoprolol (LOPRESSOR) 100 MG tablet Take 25 mg by mouth 2 (two) times daily.     . naproxen (NAPROSYN) 500 MG tablet as needed.     . nitroGLYCERIN (NITROSTAT) 0.4 MG SL tablet Place 0.4 mg under the tongue every 5 (five) minutes as needed.      . NON FORMULARY Domperidone 10 mg Patient takes 2 by mouth daily      . omega-3 acid ethyl esters (LOVAZA) 1 G capsule Take 2 capsules (2 g total) by mouth 2 (two) times daily. 120 capsule 5  . ondansetron (ZOFRAN) 4 MG tablet Take 4 mg by mouth every 8 (eight) hours as needed for nausea or vomiting.    Marland Kitchen OVER THE COUNTER MEDICATION Patient is taking Pancreatic Enzymes 4X -Amylase 50,000 , Lipase 4000,Protease 50 ,000 , Pancreatic Enzymes 4x 50 mg Patient takes 2 by mouth daily.    . pantoprazole (PROTONIX) 40 MG tablet TAKE 1 TABLET BY MOUTH DAILY. (Patient taking differently: as needed) 30 tablet 1  . PARoxetine (PAXIL) 10 MG tablet Take 10 mg by mouth daily.      . polyethylene glycol-electrolytes (NULYTELY/GOLYTELY) 420 g solution Take 4,000 mLs by mouth once. 4000 mL 0  . promethazine (PHENERGAN) 25 MG tablet Take 25 mg by mouth as needed.       No current facility-administered medications on file prior to visit.    Allergies  Allergen Reactions  . Metoclopramide Hcl Hives    Patient became very spastic  . Iodinated Diagnostic Agents     Rash, nausea and flushing  . Iohexol      Desc: HIVES,SOB NEEDS PREP.MEDS   . Sulfonamide Derivatives     Large hives  . Penicillins Rash    Other Reaction: Not Assessed  . Sulfa Antibiotics Rash    Other Reaction: Not Assessed    Assessment/Plan: Hyperlipidemia: LDL on most recent panel is not at goal <70. Will recheck labs later this week or early next week after being on Lovaza for 90 days at patient request. Pending these results will pursue Repatha. Explained the importance of getting LDL to goal to reduce risk as much as possible. He states he understands but wants to see the results after >90 days on Lovaza therapy. He was given information on Repatha so that he can look into the medication and be prepared with questions should we go that route.    Thank you, Lelan Pons. Patterson Hammersmith, [facility]  02/26/2016 1:14 PM

## 2016-03-04 ENCOUNTER — Other Ambulatory Visit: Payer: Self-pay | Admitting: Cardiology

## 2016-03-04 LAB — HEPATIC FUNCTION PANEL
ALK PHOS: 43 U/L (ref 40–115)
ALT: 13 U/L (ref 9–46)
AST: 19 U/L (ref 10–35)
Albumin: 3.9 g/dL (ref 3.6–5.1)
BILIRUBIN INDIRECT: 0.6 mg/dL (ref 0.2–1.2)
BILIRUBIN TOTAL: 0.7 mg/dL (ref 0.2–1.2)
Bilirubin, Direct: 0.1 mg/dL (ref <=0.2)
TOTAL PROTEIN: 6 g/dL — AB (ref 6.1–8.1)

## 2016-03-04 LAB — LIPID PANEL
CHOLESTEROL: 255 mg/dL — AB (ref 125–200)
HDL: 45 mg/dL (ref >=40)
LDL CALC: 169 mg/dL — AB (ref <130)
TRIGLYCERIDES: 205 mg/dL — AB (ref <150)
Total CHOL/HDL Ratio: 5.7 Ratio — ABNORMAL HIGH (ref <=5.0)
VLDL: 41 mg/dL — AB (ref <30)

## 2016-03-10 ENCOUNTER — Telehealth: Payer: Self-pay | Admitting: Pharmacist

## 2016-03-10 NOTE — Telephone Encounter (Signed)
Spoke to patient about Lipid panel results. His LDL is not at goal on current therapy of Lovaza. Will submit information to insurance company for approval of [place]. He would like to try the once monthly infusion.

## 2016-03-11 ENCOUNTER — Other Ambulatory Visit: Payer: Self-pay

## 2016-03-11 ENCOUNTER — Encounter (HOSPITAL_COMMUNITY): Payer: Self-pay

## 2016-03-11 ENCOUNTER — Encounter (HOSPITAL_COMMUNITY)
Admission: RE | Disposition: A | Discharge status: Home / Self Care | Payer: Self-pay | Source: Ambulatory Visit | Attending: Internal Medicine

## 2016-03-11 ENCOUNTER — Ambulatory Visit (HOSPITAL_COMMUNITY)
Admission: RE | Admit: 2016-03-11 | Discharge: 2016-03-11 | Disposition: A | Discharge status: Home / Self Care | Payer: Medicare Other | Source: Ambulatory Visit | Attending: Internal Medicine | Admitting: Internal Medicine

## 2016-03-11 DIAGNOSIS — R1319 Other dysphagia: Secondary | ICD-10-CM | POA: Insufficient documentation

## 2016-03-11 DIAGNOSIS — D649 Anemia, unspecified: Secondary | ICD-10-CM | POA: Insufficient documentation

## 2016-03-11 DIAGNOSIS — D125 Benign neoplasm of sigmoid colon: Secondary | ICD-10-CM | POA: Insufficient documentation

## 2016-03-11 DIAGNOSIS — Z951 Presence of aortocoronary bypass graft: Secondary | ICD-10-CM | POA: Diagnosis not present

## 2016-03-11 DIAGNOSIS — K228 Other specified diseases of esophagus: Secondary | ICD-10-CM | POA: Diagnosis not present

## 2016-03-11 DIAGNOSIS — K921 Melena: Secondary | ICD-10-CM | POA: Diagnosis not present

## 2016-03-11 DIAGNOSIS — Z79899 Other long term (current) drug therapy: Secondary | ICD-10-CM | POA: Insufficient documentation

## 2016-03-11 DIAGNOSIS — Z882 Allergy status to sulfonamides status: Secondary | ICD-10-CM | POA: Insufficient documentation

## 2016-03-11 DIAGNOSIS — R1314 Dysphagia, pharyngoesophageal phase: Secondary | ICD-10-CM | POA: Diagnosis not present

## 2016-03-11 DIAGNOSIS — Z833 Family history of diabetes mellitus: Secondary | ICD-10-CM | POA: Diagnosis not present

## 2016-03-11 DIAGNOSIS — K219 Gastro-esophageal reflux disease without esophagitis: Secondary | ICD-10-CM | POA: Diagnosis not present

## 2016-03-11 DIAGNOSIS — K861 Other chronic pancreatitis: Secondary | ICD-10-CM | POA: Insufficient documentation

## 2016-03-11 DIAGNOSIS — K2270 Barrett's esophagus without dysplasia: Secondary | ICD-10-CM | POA: Insufficient documentation

## 2016-03-11 DIAGNOSIS — Z9049 Acquired absence of other specified parts of digestive tract: Secondary | ICD-10-CM | POA: Insufficient documentation

## 2016-03-11 DIAGNOSIS — D509 Iron deficiency anemia, unspecified: Secondary | ICD-10-CM | POA: Insufficient documentation

## 2016-03-11 DIAGNOSIS — Z888 Allergy status to other drugs, medicaments and biological substances status: Secondary | ICD-10-CM | POA: Insufficient documentation

## 2016-03-11 DIAGNOSIS — I2510 Atherosclerotic heart disease of native coronary artery without angina pectoris: Secondary | ICD-10-CM | POA: Insufficient documentation

## 2016-03-11 DIAGNOSIS — I4510 Unspecified right bundle-branch block: Secondary | ICD-10-CM | POA: Insufficient documentation

## 2016-03-11 DIAGNOSIS — E785 Hyperlipidemia, unspecified: Secondary | ICD-10-CM | POA: Diagnosis not present

## 2016-03-11 DIAGNOSIS — E119 Type 2 diabetes mellitus without complications: Secondary | ICD-10-CM | POA: Insufficient documentation

## 2016-03-11 DIAGNOSIS — Z955 Presence of coronary angioplasty implant and graft: Secondary | ICD-10-CM | POA: Insufficient documentation

## 2016-03-11 DIAGNOSIS — Z880 Allergy status to penicillin: Secondary | ICD-10-CM | POA: Insufficient documentation

## 2016-03-11 DIAGNOSIS — K648 Other hemorrhoids: Secondary | ICD-10-CM

## 2016-03-11 DIAGNOSIS — R195 Other fecal abnormalities: Secondary | ICD-10-CM

## 2016-03-11 DIAGNOSIS — K5730 Diverticulosis of large intestine without perforation or abscess without bleeding: Secondary | ICD-10-CM | POA: Insufficient documentation

## 2016-03-11 DIAGNOSIS — Z794 Long term (current) use of insulin: Secondary | ICD-10-CM | POA: Insufficient documentation

## 2016-03-11 DIAGNOSIS — Z8249 Family history of ischemic heart disease and other diseases of the circulatory system: Secondary | ICD-10-CM | POA: Insufficient documentation

## 2016-03-11 HISTORY — PX: COLONOSCOPY: SHX5424

## 2016-03-11 HISTORY — PX: ESOPHAGOGASTRODUODENOSCOPY: SHX5428

## 2016-03-11 LAB — GLUCOSE, CAPILLARY: GLUCOSE-CAPILLARY: 196 mg/dL — AB (ref 65–99)

## 2016-03-11 SURGERY — EGD (ESOPHAGOGASTRODUODENOSCOPY)
Anesthesia: Moderate Sedation

## 2016-03-11 MED ORDER — MIDAZOLAM HCL 5 MG/5ML IJ SOLN
INTRAMUSCULAR | Status: DC | PRN
  Administered 2016-03-11: 2 mg via INTRAVENOUS
  Administered 2016-03-11: 3 mg via INTRAVENOUS
  Administered 2016-03-11: 1 mg via INTRAVENOUS

## 2016-03-11 MED ORDER — SODIUM CHLORIDE 0.9 % IV SOLN
INTRAVENOUS | Status: DC
  Administered 2016-03-11: 20 mL/h via INTRAVENOUS

## 2016-03-11 MED ORDER — MIDAZOLAM HCL 5 MG/5ML IJ SOLN
INTRAMUSCULAR | Status: AC
  Filled 2016-03-11: qty 10

## 2016-03-11 MED ORDER — BUTAMBEN-TETRACAINE-BENZOCAINE 2-2-14 % EX AERO
INHALATION_SPRAY | CUTANEOUS | Status: DC | PRN
  Administered 2016-03-11: 2 via TOPICAL

## 2016-03-11 MED ORDER — MEPERIDINE HCL 50 MG/ML IJ SOLN
INTRAMUSCULAR | Status: DC | PRN
  Administered 2016-03-11 (×2): 25 mg via INTRAVENOUS

## 2016-03-11 MED ORDER — PROMETHAZINE HCL 25 MG/ML IJ SOLN
INTRAMUSCULAR | Status: AC
  Administered 2016-03-11: 12.5 mg
  Filled 2016-03-11: qty 1

## 2016-03-11 MED ORDER — SODIUM CHLORIDE 0.9% FLUSH
INTRAVENOUS | Status: AC
  Filled 2016-03-11: qty 10

## 2016-03-11 MED ORDER — SIMETHICONE 40 MG/0.6ML PO SUSP
ORAL | Status: DC | PRN
  Administered 2016-03-11: 13:00:00

## 2016-03-11 MED ORDER — PROMETHAZINE HCL 25 MG/ML IJ SOLN
25.0000 mg | Freq: Once | INTRAMUSCULAR | Status: AC
  Administered 2016-03-11: 12.5 mg via INTRAVENOUS

## 2016-03-11 MED ORDER — MEPERIDINE HCL 50 MG/ML IJ SOLN
INTRAMUSCULAR | Status: AC
  Filled 2016-03-11: qty 1

## 2016-03-11 NOTE — Discharge Instructions (Signed)
Resume clopidogrel on 03/12/2016. No NSAIDs for 2 days. Resume other medications and diet as before. No driving for 24 hours. Physician will call with biopsy results.  Colonoscopy, Care After These instructions give you information on caring for yourself after your procedure. Your doctor may also give you more specific instructions. Call your doctor if you have any problems or questions after your procedure. HOME CARE  Do not drive for 24 hours.  Do not sign important papers or use machinery for 24 hours.  You may shower.  You may go back to your usual activities, but go slower for the first 24 hours.  Take rest breaks often during the first 24 hours.  Walk around or use warm packs on your belly (abdomen) if you have belly cramping or gas.  Drink enough fluids to keep your pee (urine) clear or pale yellow.  Resume your normal diet. Avoid heavy or fried foods.  Avoid drinking alcohol for 24 hours or as told by your doctor.  Only take medicines as told by your doctor. If a tissue sample (biopsy) was taken during the procedure:   Do not take aspirin or blood thinners for 7 days, or as told by your doctor.  Do not drink alcohol for 7 days, or as told by your doctor.  Eat soft foods for the first 24 hours. GET HELP IF: You still have a small amount of blood in your poop (stool) 2-3 days after the procedure. GET HELP RIGHT AWAY IF:  You have more than a small amount of blood in your poop.  You see clumps of tissue (blood clots) in your poop.  Your belly is puffy (swollen).  You feel sick to your stomach (nauseous) or throw up (vomit).  You have a fever.  You have belly pain that gets worse and medicine does not help. MAKE SURE YOU:  Understand these instructions.  Will watch your condition.  Will get help right away if you are not doing well or get worse.   This information is not intended to replace advice given to you by your health care provider. Make sure you  discuss any questions you have with your health care provider.   Document Released: 09/11/2010 Document Revised: 08/14/2013 Document Reviewed: 04/16/2013 Elsevier Interactive Patient Education 2016 [place].  Esophagogastroduodenoscopy, Care After Refer to this sheet in the next few weeks. These instructions provide you with information about caring for yourself after your procedure. Your health care provider may also give you more specific instructions. Your treatment has been planned according to current medical practices, but problems sometimes occur. Call your health care provider if you have any problems or questions after your procedure. WHAT TO EXPECT AFTER THE PROCEDURE After your procedure, it is typical to feel:  Soreness in your throat.  Pain with swallowing.  Sick to your stomach (nauseous).  Bloated.  Dizzy.  Fatigued. HOME CARE INSTRUCTIONS  Do not eat or drink anything until the numbing medicine (local anesthetic) has worn off and your gag reflex has returned. You will know that the local anesthetic has worn off when you can swallow comfortably.  Do not drive or operate machinery until directed by your health care provider.  Take medicines only as directed by your health care provider. SEEK MEDICAL CARE IF:   You cannot stop coughing.  You are not urinating at all or less than usual. SEEK IMMEDIATE MEDICAL CARE IF:  You have difficulty swallowing.  You cannot eat or drink.  You have worsening  throat or chest pain.  You have dizziness or lightheadedness or you faint.  You have nausea or vomiting.  You have chills.  You have a fever.  You have severe abdominal pain.  You have black, tarry, or bloody stools.   This information is not intended to replace advice given to you by your health care provider. Make sure you discuss any questions you have with your health care provider.   Document Released: 07/26/2012 Document Revised: 08/30/2014  Document Reviewed: 07/26/2012 Elsevier Interactive Patient Education [employer].

## 2016-03-11 NOTE — H&P (Signed)
[name] is an 55 y.o. male.   Chief Complaint: Patient is here for EGD, ED and colonoscopy. HPI: [name] is 55 year old Caucasian male with multiple medical problems who was recently found to have iron deficiency anemia and heme positive stool. He also experience few episodes of.stool. He denies frank rectal bleeding except he noted some segment took prep. He has hemochromatosis and has been on phlebotomy. He states his hemoglobin is up to 13 g she had checked prior to his last phlebotomy. Last colonoscopy was please 10 years ago. He is on PPI for chronic GERD. Heartburn is well controlled. He has had few episodes of solid food dysphagia. Patient complained of transient retrosternal pain while in preop area. Pain resolved spontaneously within couple of minutes. EKG was obtained and did not show any ST segment or T-wave abnormalities. Family History is negative for CRC. He has been off Plavix for 5 days.  Past Medical History  Diagnosis Date  . Coronary artery disease     Patent Stent to Circ.  Patent coronary bypass grafts with a free radial graft to PDA and LIMA to the  diag.  . Diabetes mellitus   . Pancreatitis chronic     Pseudocyst  . Dyslipidemia     Poorly controlled, probably related to his DM (Some of this is secondary to his inability to afford medications).  . ED (erectile dysfunction)   . 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 Metaplasia of esophagus   . Chronic gastritis   . Esophageal yeast infection ([place])   . Polycythemia vera(238.4) 04/26/2012  . Anemia     Past Surgical History  Procedure Laterality Date  . Appendectomy    . Cholecystectomy    . Knee surgery      left knee  . Nasal septum surgery    . Finger tendon repair      in middle finger  . Coronary artery bypass graft  11/24/2004  . Cardiac catheterization    . Esophagogastroduodenoscopy  09/19/01  . Colonoscopy  07/31/1985  . Coronary angioplasty with stent placement  2006    Family History   Problem Relation Age of Onset  . Coronary artery disease Mother     strong family history of  . Heart attack Mother     dying of (at age 41)  . Diabetes Mother   . Diabetes Sister   . Diabetes Brother   . Pancreatitis Brother   . Healthy Daughter   . Hyperlipidemia Daughter   . Heart disease Brother   . Hypertension Brother   . Hyperlipidemia Brother    Social History:  reports that he has never smoked. He has never used smokeless tobacco. He reports that he drinks alcohol. He reports that he does not use illicit drugs.  Allergies:  Allergies  Allergen Reactions  . Metoclopramide Hcl Hives    Patient became very spastic  . Iodinated Diagnostic Agents     Rash, nausea and flushing  . Iohexol      Desc: HIVES,SOB NEEDS PREP.MEDS   . Sulfonamide Derivatives     Large hives  . Penicillins Rash    Has patient had a PCN reaction causing immediate rash, facial/tongue/throat swelling, SOB or lightheadedness with hypotension: Yes Has patient had a PCN reaction causing severe rash involving mucus membranes or skin necrosis: No Has patient had a PCN reaction that required hospitalization No Has patient had a PCN reaction occurring within the last 10 years: No. If all  of the above answers are "NO", then may proceed with Cephalosporin use.   . Sulfa Antibiotics Rash    Other Reaction: Not Assessed    Medications Prior to Admission  Medication Sig Dispense Refill  . cholecalciferol (VITAMIN D) 1000 units tablet Take 4,000 Units by mouth daily.    . clopidogrel (PLAVIX) 75 MG tablet Take 75 mg by mouth daily.     . diazepam (VALIUM) 10 MG tablet Take 10 mg by mouth at bedtime as needed for anxiety or sleep. As Needed    . enalapril (VASOTEC) 10 MG tablet Take 10 mg by mouth 2 (two) times daily.    Marland Kitchen gabapentin (NEURONTIN) 100 MG capsule Take 100 mg by mouth 2 (two) times daily as needed (neuropathy).     Marland Kitchen glipiZIDE (GLUCOTROL) 10 MG tablet Take 10 mg by mouth 2 (two) times daily.      . hydrocodone-acetaminophen (LORCET PLUS) 7.5-650 MG per tablet Take 1 tablet by mouth every 6 (six) hours as needed for pain.     Marland Kitchen insulin glargine (LANTUS) 100 UNIT/ML injection Inject 50 Units into the skin at bedtime.    . metFORMIN (GLUCOPHAGE) 1000 MG tablet Take 1,000 mg by mouth 2 (two) times daily with a meal.     . metoprolol tartrate (LOPRESSOR) 25 MG tablet Take 25 mg by mouth 2 (two) times daily.    . naproxen (NAPROSYN) 500 MG tablet Take 500 mg by mouth 2 (two) times daily as needed for mild pain.     . nitroGLYCERIN (NITROSTAT) 0.4 MG SL tablet Place 0.4 mg under the tongue every 5 (five) minutes as needed for chest pain. Reported on 02/26/2016    . NON FORMULARY Domperidone 10 mg Patient takes 2 by mouth daily    . omega-3 acid ethyl esters (LOVAZA) 1 G capsule Take 2 capsules (2 g total) by mouth 2 (two) times daily. 120 capsule 5  . ondansetron (ZOFRAN) 4 MG tablet Take 4 mg by mouth every 8 (eight) hours as needed for nausea or vomiting.    Marland Kitchen OVER THE COUNTER MEDICATION Patient is taking Pancreatic Enzymes 4X -Amylase 50,000 , Lipase 4000,Protease 50 ,000 , Pancreatic Enzymes 4x 50 mg Patient takes 2 by mouth daily.    . pantoprazole (PROTONIX) 40 MG tablet TAKE 1 TABLET BY MOUTH DAILY. (Patient taking differently: TAKE 1 TABLET BY MOUTH ONCE DAILY AS NEEDED FOR INDIGESTION.) 30 tablet 1  . PARoxetine (PAXIL) 10 MG tablet Take 10 mg by mouth daily.      . promethazine (PHENERGAN) 25 MG tablet Take 25 mg by mouth every 8 (eight) hours as needed for nausea or vomiting.     . bisacodyl (DULCOLAX) 5 MG EC tablet Take 5 mg by mouth daily as needed for moderate constipation.      Results for orders placed or performed during the hospital encounter of 03/11/16 (from the past 48 hour(s))  Glucose, capillary     Status: Abnormal   Collection Time: 03/11/16 11:46 AM  Result Value Ref Range   Glucose-Capillary 196 (H) 65 - 99 mg/dL   No results found.  ROS  Blood pressure  168/110, pulse 53, temperature 97.5 F (36.4 C), temperature source Oral, resp. rate 18, height 5' 9.5" (1.765 m), weight 169 lb (76.658 kg), SpO2 100 %. Physical Exam  Constitutional: He appears well-developed and well-nourished.  HENT:  Mouth/Throat: Oropharynx is clear and moist.  Eyes: Conjunctivae are normal. No scleral icterus.  Neck: No thyromegaly present.  Cardiovascular:  Normal rate, regular rhythm and normal heart sounds.   No murmur heard. Respiratory: Effort normal and breath sounds normal.  GI: Soft. He exhibits no distension and no mass. There is no tenderness.  Lymphadenopathy:    He has no cervical adenopathy.  Neurological: He is alert.  Skin: Skin is warm and dry.     Assessment/Plan Iron deficiency anemia and heme positive stool. Solid food dysphagia. EGD with ED and colonoscopy.   Hildred Laser, MD 03/11/2016, 12:26 PM

## 2016-03-11 NOTE — Op Note (Signed)
[facility] Patient Name: [name] Procedure Date: 03/11/2016 12:22 PM MRN: [id number] Date of Birth: 12-07-1960 Attending MD: Hildred Laser , MD CSN: [id number] Age: 55 Admit Type: Outpatient Procedure:                Upper GI endoscopy Indications:              Esophageal dysphagia, Heme positive stool, Melena Providers:                Hildred Laser, MD, Lurline Del, RN, Isabella Stalling,                            Technician Referring MD:             Newt Minion, MD Medicines:                Promethazine 25 mg IV, Meperidine 50 mg IV,                            Midazolam 5 mg IV Complications:            No immediate complications. Estimated Blood Loss:     Estimated blood loss was minimal. Procedure:                Pre-Anesthesia Assessment:                           - Prior to the procedure, a History and Physical                            was performed, and patient medications and                            allergies were reviewed. The patient's tolerance of                            previous anesthesia was also reviewed. The risks                            and benefits of the procedure and the sedation                            options and risks were discussed with the patient.                            All questions were answered, and informed consent                            was obtained. Prior Anticoagulants: The patient                            last took Plavix (clopidogrel) 5 days prior to the                            procedure. ASA Grade Assessment: III - A patient  with severe systemic disease. After reviewing the                            risks and benefits, the patient was deemed in                            satisfactory condition to undergo the procedure.                           After obtaining informed consent, the endoscope was                            passed under direct vision. Throughout the            procedure, the patient's blood pressure, pulse, and                            oxygen saturations were monitored continuously. The                            Endoscope was introduced through the mouth, and                            advanced to the second part of duodenum. The upper                            GI endoscopy was accomplished without difficulty.                            The patient tolerated the procedure well. Scope In: 12:46:53 PM Scope Out: 1:01:20 PM Total Procedure Duration: 0 hours 14 minutes 27 seconds  Findings:      The examined esophagus was normal.      The Z-line was irregular and was found 38 cm from the incisors. Biopsies       were taken with a cold forceps for histology.      No endoscopic abnormality was evident in the esophagus to explain the       patient's complaint of dysphagia. It was decided, however, to proceed       with dilation of the entire esophagus. The scope was withdrawn. Dilation       was performed with a Maloney dilator with no resistance at 83 Fr. The       dilation site was examined following endoscope reinsertion and showed no       bleeding, mucosal tear or perforation.      The entire examined stomach was normal.      The duodenal bulb and second portion of the duodenum were normal. Impression:               - Normal esophagus.                           - Z-line irregular, 38 cm from the incisors.                            Biopsied.                           -  No endoscopic esophageal abnormality to explain                            patient's dysphagia. Esophagus dilated. Dilated.                           - Normal stomach.                           - Normal duodenal bulb and second portion of the                            duodenum. Moderate Sedation:      Moderate (conscious) sedation was administered by the endoscopy nurse       and supervised by the endoscopist. The following parameters were       monitored: oxygen  saturation, heart rate, blood pressure, CO2       capnography and response to care. Total physician intraservice time was       20 minutes. Recommendation:           - Patient has a contact number available for                            emergencies. The signs and symptoms of potential                            delayed complications were discussed with the                            patient. Return to normal activities tomorrow.                            Written discharge instructions were provided to the                            patient.                           - Resume previous diet today.                           - Continue present medications.                           - Resume Plavix (clopidogrel) at prior dose                            tomorrow.                           - Await pathology results. Procedure Code(s):        --- Professional ---                           [phone], Esophagogastroduodenoscopy, flexible,  transoral; with biopsy, single or multiple                           43450, Dilation of esophagus, by unguided sound or                            bougie, single or multiple passes                           99152, Moderate sedation services provided by the                            same physician or other qualified health care                            professional performing the diagnostic or                            therapeutic service that the sedation supports,                            requiring the presence of an independent trained                            observer to assist in the monitoring of the                            patient's level of consciousness and physiological                            status; initial 15 minutes of intraservice time,                            patient age 93 years or older Diagnosis Code(s):        --- Professional ---                           K22.8, Other specified diseases of esophagus                            R13.14, Dysphagia, pharyngoesophageal phase                           R19.5, Other fecal abnormalities                           K92.1, Melena (includes Hematochezia) CPT copyright 2016 American Medical Association. All rights reserved. The codes documented in this report are preliminary and upon coder review may  be revised to meet current compliance requirements. Hildred Laser, MD Hildred Laser, MD 03/11/2016 1:38:49 PM This report has been signed electronically. Number of Addenda: 0

## 2016-03-11 NOTE — Op Note (Signed)
[facility] Patient Name: [name] Procedure Date: 03/11/2016 1:03 PM MRN: [id number] Date of Birth: December 06, 1960 Attending MD: Hildred Laser , MD CSN: [id number] Age: 55 Admit Type: Outpatient Procedure:                Colonoscopy Indications:              Heme positive stool, Iron deficiency anemia Providers:                Hildred Laser, MD, Lurline Del, RN, Isabella Stalling,                            Technician Referring MD:             Newt Minion, MD Medicines:                Midazolam 1 mg IV Complications:            No immediate complications. Estimated Blood Loss:     Estimated blood loss was minimal. Procedure:                Pre-Anesthesia Assessment:                           - Prior to the procedure, a History and Physical                            was performed, and patient medications and                            allergies were reviewed. The patient's tolerance of                            previous anesthesia was also reviewed. The risks                            and benefits of the procedure and the sedation                            options and risks were discussed with the patient.                            All questions were answered, and informed consent                            was obtained. Prior Anticoagulants: The patient                            last took Plavix (clopidogrel) 5 days prior to the                            procedure. ASA Grade Assessment: III - A patient                            with severe systemic disease. After reviewing the  risks and benefits, the patient was deemed in                            satisfactory condition to undergo the procedure.                           After obtaining informed consent, the colonoscope                            was passed under direct vision. Throughout the                            procedure, the patient's blood pressure, pulse, and        oxygen saturations were monitored continuously. The                            [id number]) scope was introduced through                            the anus and advanced to the the terminal ileum.                            The colonoscopy was performed without difficulty.                            The patient tolerated the procedure well. The                            quality of the bowel preparation was adequate. The                            terminal ileum, ileocecal valve, appendiceal                            orifice, and rectum were photographed. Scope In: 1:05:27 PM Scope Out: 1:27:56 PM Scope Withdrawal Time: 0 hours 14 minutes 27 seconds  Total Procedure Duration: 0 hours 22 minutes 29 seconds  Findings:      The terminal ileum appeared normal.      A few small-mouthed diverticula were found in the sigmoid colon.      A 5 mm polyp was found in the distal sigmoid colon. The polyp was       semi-pedunculated. The polyp was removed with a cold snare. Resection       and retrieval were complete.      Internal hemorrhoids were found during retroflexion. The hemorrhoids       were small. Impression:               - The examined portion of the ileum was normal.                           - Diverticulosis in the sigmoid colon.                           - One 5 mm polyp in the distal sigmoid  colon,                            removed with a cold snare. Resected and retrieved.                           - Internal hemorrhoids. Moderate Sedation:      Moderate (conscious) sedation was administered by the endoscopy nurse       and supervised by the endoscopist. The following parameters were       monitored: oxygen saturation, heart rate, blood pressure, CO2       capnography and response to care. Total physician intraservice time was       26 minutes. Recommendation:           - Patient has a contact number available for                            emergencies. The signs and  symptoms of potential                            delayed complications were discussed with the                            patient. Return to normal activities tomorrow.                            Written discharge instructions were provided to the                            patient.                           - Resume previous diet today.                           - Continue present medications.                           - Resume Plavix (clopidogrel) at prior dose                            tomorrow.                           - Await pathology results.                           - Repeat colonoscopy for surveillance based on                            pathology results. Procedure Code(s):        --- Professional ---                           [id number], Colonoscopy, flexible; with removal of  tumor(s), polyp(s), or other lesion(s) by snare                            technique                           99152, Moderate sedation services provided by the                            same physician or other qualified health care                            professional performing the diagnostic or                            therapeutic service that the sedation supports,                            requiring the presence of an independent trained                            observer to assist in the monitoring of the                            patient's level of consciousness and physiological                            status; initial 15 minutes of intraservice time,                            patient age 30 years or older                           [phone], Moderate sedation services; each additional                            15 minutes intraservice time Diagnosis Code(s):        --- Professional ---                           K64.8, Other hemorrhoids                           D12.5, Benign neoplasm of sigmoid colon                           R19.5, Other fecal abnormalities                            D50.9, Iron deficiency anemia, unspecified                           K57.30, Diverticulosis of large intestine without                            perforation or abscess without bleeding CPT copyright  2016 American Medical Association. All rights reserved. The codes documented in this report are preliminary and upon coder review may  be revised to meet current compliance requirements. Hildred Laser, MD Hildred Laser, MD 03/11/2016 1:47:28 PM This report has been signed electronically. Number of Addenda: 0

## 2016-03-16 ENCOUNTER — Telehealth: Payer: Self-pay | Admitting: Pharmacist

## 2016-03-16 MED ORDER — EVOLOCUMAB WITH INFUSOR 420 MG/3.5ML ~~LOC~~ SOCT: 420.0000 mg | SUBCUTANEOUS | 12 refills | Status: DC

## 2016-03-16 MED ORDER — OMEGA-3-ACID ETHYL ESTERS 1 G PO CAPS: 2.0000 g | ORAL_CAPSULE | Freq: Two times a day (BID) | ORAL | 3 refills | Status: DC

## 2016-03-16 MED ORDER — OMEGA-3-ACID ETHYL ESTERS 1 G PO CAPS: 2.0000 g | ORAL_CAPSULE | Freq: Two times a day (BID) | ORAL | 5 refills | Status: DC

## 2016-03-16 NOTE — Telephone Encounter (Signed)
Returned call and LM about sending RX for Lovaza to [facility]   He returned my call and would prefer the rx be filled at [facility] due to cost. He also stated that he received the copay for Repatha for $270 the first month and then $170 thereafter which is cost prohibitive for him.  We will submit information to drug company for coverage of [place].

## 2016-03-17 ENCOUNTER — Encounter (HOSPITAL_COMMUNITY): Payer: Self-pay | Admitting: Internal Medicine

## 2016-03-24 ENCOUNTER — Telehealth (INDEPENDENT_AMBULATORY_CARE_PROVIDER_SITE_OTHER): Payer: Self-pay | Admitting: Internal Medicine

## 2016-03-24 ENCOUNTER — Telehealth: Payer: Self-pay

## 2016-03-24 NOTE — Telephone Encounter (Signed)
Received fax from [facility] stating patient insurance changed to [employer]. Changes made to chart.

## 2016-03-24 NOTE — Telephone Encounter (Signed)
I will be reviewed with Dr.Rehman.

## 2016-03-24 NOTE — Telephone Encounter (Signed)
Patient called, stated that he spoke with Dr. Laural Golden late yesterday.  He wants to know if he is to follow up in November from his procedure.  [phone] (cell) [phone] (home)

## 2016-03-25 NOTE — Telephone Encounter (Signed)
Notes Recorded by Rogene Houston, MD on 03/23/2016 at 5:12 PM EDT Biopsy results reviewed with patient. 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 EGD and colonoscopy in 5 years. Suspect he is having abdominal wall pain. He will call if pain gets worse. Report to Dr. Karie Kirks.   To follow up with Dr.Rehman about the patient having a follow up appointment in November.

## 2016-03-25 NOTE — Telephone Encounter (Signed)
Hope- Dr.Rehman says that the patient needs 1 year follow up visit. Patient will not need follow up in November. Patient will need to be made aware.

## 2016-03-25 NOTE — Telephone Encounter (Signed)
Patient will be contacted with what Dr.Rehman recommends.

## 2016-04-01 NOTE — Telephone Encounter (Signed)
Patient was advised that he did not need a November appointment, but needs to come back in one year.  Patient has been place on the recall list.

## 2016-04-05 ENCOUNTER — Telehealth: Payer: Self-pay | Admitting: Pharmacist

## 2016-04-05 ENCOUNTER — Encounter: Payer: Self-pay | Admitting: Pharmacist

## 2016-04-05 DIAGNOSIS — E785 Hyperlipidemia, unspecified: Secondary | ICD-10-CM

## 2016-04-05 NOTE — Telephone Encounter (Signed)
Patient called and was approved through the end of the year through the [place] program. He will get his first shipment on Thursday.   He will start the medication at that time. Will order labs for 2 months after first injection.   Will have him remain on Fish oil for TG benefit since these have been elevated in the past and Repatha works more on LDL.   Pt. States he understands and appreciates help with lipids. He will follow- up with Dr. Percival Spanish unless he develops issues or concerns.   We will touch base at the end of year for plan to get Repatha covered next year.

## 2016-06-09 ENCOUNTER — Telehealth: Payer: Self-pay | Admitting: Pharmacist

## 2016-06-09 NOTE — Telephone Encounter (Signed)
Returned call to patient. No details left in message just that about Repatha.

## 2016-06-10 NOTE — Telephone Encounter (Signed)
Returned call pt states that he did not do well after first injection. He had headaches and stomach cramps. He states his bones ached and he felt as if he could not go at all. He states his headache last the entire month. Was taking naproxen to control HA.   He states the side effects lasted the whole month.   He was due to second shot on sept 28th but did not take due to previous side effects.   He does not wish to rechallenge with Repatha.   Offered to look into Praluent if interested. He wants to see what his cholesterol looks like after first injection to determine if he wants to pursue PCSK9i therapy at all in the future.

## 2016-08-19 LAB — HEPATIC FUNCTION PANEL
ALK PHOS: 38 U/L — AB (ref 40–115)
ALT: 13 U/L (ref 9–46)
AST: 19 U/L (ref 10–35)
Albumin: 3.8 g/dL (ref 3.6–5.1)
BILIRUBIN DIRECT: 0.1 mg/dL (ref <=0.2)
BILIRUBIN INDIRECT: 0.5 mg/dL (ref 0.2–1.2)
Total Bilirubin: 0.6 mg/dL (ref 0.2–1.2)
Total Protein: 6.1 g/dL (ref 6.1–8.1)

## 2016-08-19 LAB — LIPID PANEL
Cholesterol: 262 mg/dL — ABNORMAL HIGH (ref <200)
HDL: 38 mg/dL — ABNORMAL LOW (ref >40)
Total CHOL/HDL Ratio: 6.9 Ratio — ABNORMAL HIGH (ref <5.0)
Triglycerides: 433 mg/dL — ABNORMAL HIGH (ref <150)

## 2016-08-19 LAB — LDL CHOLESTEROL, DIRECT: Direct LDL: 110 mg/dL (ref <130)

## 2016-08-24 ENCOUNTER — Telehealth: Payer: Self-pay | Admitting: Pharmacist

## 2016-08-24 NOTE — Telephone Encounter (Signed)
LMTCB about lipid results.

## 2016-08-24 NOTE — Telephone Encounter (Signed)
Patient called back about lipid results. He reports he has been off his fish oil secondary to entering the donut hole and the cost being prohibitive. He restarted earlier this week. He is very pleased with his LDL results after just one injection of Repatha about 3 months ago. He also reports that he has started a multivitamin with B vitamins and is feeling much better. He would like to pursue the Q14D Repatha injection as he believes he may tolerate this better. Will submit for reauthorization for this formulation rather than the pump. He will send his portion of the form to be faxed.

## 2016-09-28 ENCOUNTER — Telehealth: Payer: Self-pay | Admitting: Pharmacist

## 2016-09-28 NOTE — Telephone Encounter (Signed)
Patient spoke to [employer] and they will require PA to be submitted. Will submit to insurance today for coverage. CMM key [id number].

## 2016-10-01 NOTE — Telephone Encounter (Signed)
Received call from pt - he states [employer] called him and said they need a prior auth before they can approve his Repatha. This was already done and approved 3 days ago. Will send copy of approval to [employer].

## 2016-10-05 ENCOUNTER — Telehealth: Payer: Self-pay | Admitting: Pharmacist

## 2016-10-05 NOTE — Telephone Encounter (Signed)
Called patient to confirmed patient PCP has access to lipid-profile from Dec/2017.    Re-assure paperwork from [employer] approval was sent to [facility] again on Monday and waiting for response.    Will follow up next week with ANGEN an notify patient of any changes.

## 2016-10-28 ENCOUNTER — Telehealth: Payer: Self-pay | Admitting: Pharmacist

## 2016-10-28 NOTE — Telephone Encounter (Signed)
Patient reports that he gave himself his first Q 14 days Repatha on Sunday. He states that a few weeks prior to this he did have a pancreatic flair which caused significant diarrhea, but he reports that after the injection on Sunday he has had intractable diarrhea. He is unable to leave the house due to the diarrhea and feel fairly sure this is due to the [place]. I advised since he was intolerant to Repatha once monthly and now Repatha Q14D would recommend he remain off medication. He is to have labs done for PCP which will include a lipid panel in the next few days. I requested he have those sent to our office so that we can be sure TG have come back down after restarting Fish oil. We discussed option of trying Praluent or potentially drug trial and he wishes to defer at this time until he is feeling better.

## 2016-11-02 ENCOUNTER — Telehealth (INDEPENDENT_AMBULATORY_CARE_PROVIDER_SITE_OTHER): Payer: Self-pay | Admitting: Internal Medicine

## 2016-11-02 ENCOUNTER — Other Ambulatory Visit (INDEPENDENT_AMBULATORY_CARE_PROVIDER_SITE_OTHER): Payer: Self-pay | Admitting: *Deleted

## 2016-11-02 DIAGNOSIS — R109 Unspecified abdominal pain: Secondary | ICD-10-CM

## 2016-11-02 NOTE — Telephone Encounter (Signed)
Patient called at 4:28pm, he left a message requesting to speak to Dr. Laural Golden.  He stated that he is having some pains and would like to talk to someone to find out what he needs to do.    [phone]

## 2016-11-03 NOTE — Telephone Encounter (Signed)
Patient called returned. He states that Last Sunday he injected 140 mg of Prepatha, which is being used to lower his LDL. This stays in the patient's system for 2 weeks and is to be administered every 14 days. His pain is horrible. On Monday it has been one problem after the other. Patient says that he ate plain yogurt,scrammbled egg sandwhich. He took pain medication and Diazepam. He feels that like he is ulcerated on the inside,like he did prior to going to [place]. Today he has eaten [place]. Taken 3/4 of a pain pill.  Per Dr.Rehman the patient should contact the prescribing physician about the [place]. Dr.Rehman ordered LFT's , Amylase. Lipase to be done.  Patient was called and advised of Dr.Rehman's recommendation , he then states that he has been taken off of the [place].  We are awaiting lab results.

## 2016-11-06 LAB — HEPATIC FUNCTION PANEL
ALBUMIN: 3.9 g/dL (ref 3.6–5.1)
ALT: 13 U/L (ref 9–46)
AST: 21 U/L (ref 10–35)
Alkaline Phosphatase: 43 U/L (ref 40–115)
BILIRUBIN DIRECT: 0.1 mg/dL (ref <=0.2)
BILIRUBIN TOTAL: 0.7 mg/dL (ref 0.2–1.2)
Indirect Bilirubin: 0.6 mg/dL (ref 0.2–1.2)
Total Protein: 6.4 g/dL (ref 6.1–8.1)

## 2016-11-06 LAB — AMYLASE: AMYLASE: 32 U/L (ref 0–105)

## 2016-11-06 LAB — LIPASE: LIPASE: 26 U/L (ref 7–60)

## 2016-12-07 ENCOUNTER — Telehealth: Payer: Self-pay | Admitting: Pharmacist

## 2016-12-07 NOTE — Telephone Encounter (Signed)
Patient stopped taking Repatha 1 months ago after episode of pancreatitis.  He is taking Lovaza 1g (2grams twice daily) and no other lipid therapy.  Options of Praluent and Clinical Trials previously discussed with patient but no decision made by patient yet.

## 2017-01-14 ENCOUNTER — Telehealth: Payer: Self-pay | Admitting: Cardiovascular Disease

## 2017-01-14 NOTE — Telephone Encounter (Signed)
Ebony Hail would like to know when pt had his last CBS here please?

## 2017-01-14 NOTE — Telephone Encounter (Signed)
No one answers when number called.

## 2017-01-18 NOTE — Telephone Encounter (Signed)
Left message for [name] to call

## 2017-01-20 NOTE — Telephone Encounter (Signed)
Spoke with [place], she does not need anything.

## 2017-02-03 NOTE — Progress Notes (Deleted)
HPI The patient presents for followup of his known coronary disease.  At a recent visit he was having some chest discomfort. However, stress perfusion study demonstrated an EF of 60% with no ischemia.  I did send to the lipid cinic and he was taking Repatha.  However, he stopped this after an episode of pancreatitis.  ***    He returns for follow-up.  He is to have a colonoscopy done soon and EGD with esophageal stretching.   He has been having problems with his prostate and probably has an ongoing infection.  He has not had any overt cardiology complaints.  He did have one episode of chest pain about 4 weeks ago.  He otherwise has not had any chest pain.  He has not required any NTG.  The patient denies any new symptoms such as chest discomfort, neck or arm discomfort. There has been no new shortness of breath, PND or orthopnea. There have been no reported palpitations, presyncope or syncope.   Allergies  Allergen Reactions  . Metoclopramide Hcl Hives    Patient became very spastic  . Iodinated Diagnostic Agents     Rash, nausea and flushing  . Iohexol      Desc: HIVES,SOB NEEDS PREP.MEDS   . Sulfonamide Derivatives     Large hives  . Penicillins Rash    Has patient had a PCN reaction causing immediate rash, facial/tongue/throat swelling, SOB or lightheadedness with hypotension: Yes Has patient had a PCN reaction causing severe rash involving mucus membranes or skin necrosis: No Has patient had a PCN reaction that required hospitalization No Has patient had a PCN reaction occurring within the last 10 years: No. If all of the above answers are "NO", then may proceed with Cephalosporin use.   . Sulfa Antibiotics Rash    Other Reaction: Not Assessed    Current Outpatient Prescriptions  Medication Sig Dispense Refill  . bisacodyl (DULCOLAX) 5 MG EC tablet Take 5 mg by mouth daily as needed for moderate constipation.    . cholecalciferol (VITAMIN D) 1000 units tablet Take 4,000 Units  by mouth daily.    . clopidogrel (PLAVIX) 75 MG tablet Take 1 tablet (75 mg total) by mouth daily.    . diazepam (VALIUM) 10 MG tablet Take 10 mg by mouth at bedtime as needed for anxiety or sleep. As Needed    . enalapril (VASOTEC) 10 MG tablet Take 10 mg by mouth 2 (two) times daily.    Marland Kitchen gabapentin (NEURONTIN) 100 MG capsule Take 100 mg by mouth 2 (two) times daily as needed (neuropathy).     Marland Kitchen glipiZIDE (GLUCOTROL) 10 MG tablet Take 10 mg by mouth 2 (two) times daily.     . hydrocodone-acetaminophen (LORCET PLUS) 7.5-650 MG per tablet Take 1 tablet by mouth every 6 (six) hours as needed for pain.     Marland Kitchen insulin glargine (LANTUS) 100 UNIT/ML injection Inject 50 Units into the skin at bedtime.    . metFORMIN (GLUCOPHAGE) 1000 MG tablet Take 1,000 mg by mouth 2 (two) times daily with a meal.     . metoprolol tartrate (LOPRESSOR) 25 MG tablet Take 25 mg by mouth 2 (two) times daily.    . naproxen (NAPROSYN) 500 MG tablet Take 500 mg by mouth 2 (two) times daily as needed for mild pain.     . nitroGLYCERIN (NITROSTAT) 0.4 MG SL tablet Place 0.4 mg under the tongue every 5 (five) minutes as needed for chest pain. Reported on 02/26/2016    .  NON FORMULARY Domperidone 10 mg Patient takes 2 by mouth daily    . omega-3 acid ethyl esters (LOVAZA) 1 g capsule Take 2 capsules (2 g total) by mouth 2 (two) times daily. 360 capsule 3  . ondansetron (ZOFRAN) 4 MG tablet Take 4 mg by mouth every 8 (eight) hours as needed for nausea or vomiting.    Marland Kitchen OVER THE COUNTER MEDICATION Patient is taking Pancreatic Enzymes 4X -Amylase 50,000 , Lipase 4000,Protease 50 ,000 , Pancreatic Enzymes 4x 50 mg Patient takes 2 by mouth daily.    . pantoprazole (PROTONIX) 40 MG tablet TAKE 1 TABLET BY MOUTH DAILY. (Patient taking differently: TAKE 1 TABLET BY MOUTH ONCE DAILY AS NEEDED FOR INDIGESTION.) 30 tablet 1  . PARoxetine (PAXIL) 10 MG tablet Take 10 mg by mouth daily.      . promethazine (PHENERGAN) 25 MG tablet Take 25 mg by  mouth every 8 (eight) hours as needed for nausea or vomiting.      No current facility-administered medications for this visit.     Past Medical History:  Diagnosis Date  . Anemia   . 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 Chronic gastritis   . Coronary artery disease    Patent Stent to Circ.  Patent coronary bypass grafts with a free radial graft to PDA and LIMA to the  diag.  . Diabetes mellitus   . Dyslipidemia    Poorly controlled, probably related to his DM (Some of this is secondary to his inability to afford medications).  . ED (erectile dysfunction)   . Esophageal yeast infection ([place])   . Metaplasia of esophagus   . Pancreatitis chronic    Pseudocyst  . Polycythemia vera(238.4) 04/26/2012    Past Surgical History:  Procedure Laterality Date  . APPENDECTOMY    . CARDIAC CATHETERIZATION    . CHOLECYSTECTOMY    . COLONOSCOPY  07/31/1985  . COLONOSCOPY N/A 03/11/2016   Procedure: COLONOSCOPY;  Surgeon: Rogene Houston, MD;  [facility];  Service: Endoscopy;  Laterality: N/A;  . CORONARY ANGIOPLASTY WITH STENT PLACEMENT  2006  . CORONARY ARTERY BYPASS GRAFT  11/24/2004  . ESOPHAGOGASTRODUODENOSCOPY  09/19/01  . ESOPHAGOGASTRODUODENOSCOPY N/A 03/11/2016   Procedure: ESOPHAGOGASTRODUODENOSCOPY (EGD);  Surgeon: Rogene Houston, MD;  [facility]: [facility];  Service: Endoscopy;  Laterality: N/A;  12:00  . FINGER TENDON REPAIR     in middle finger  . KNEE SURGERY     left knee  . NASAL SEPTUM SURGERY      ROS  ***  PHYSICAL EXAM There were no vitals taken for this visit.  GENERAL:  Well appearing NECK:  No jugular venous distention, waveform within normal limits, carotid upstroke brisk and symmetric, no bruits, no thyromegaly LUNGS:  Clear to auscultation bilaterally BACK:  No CVA tenderness CHEST:  Unremarkable HEART:  PMI not displaced or sustained,S1 and S2 within normal limits, no S3, no S4, no clicks, no rubs, *** murmurs ABD:  Flat, positive bowel  sounds normal in frequency in pitch, no bruits, no rebound, no guarding, no midline pulsatile mass, no hepatomegaly, no splenomegaly EXT:  2 plus pulses throughout, no edema, no cyanosis no clubbing   GENERAL:  Well appearing NECK:  No jugular venous distention, waveform within normal limits, carotid upstroke brisk and symmetric, no bruits, no thyromegaly LUNGS:  Clear to auscultation bilaterally BACK:  No CVA tenderness CHEST:  Well healed sternotomy scar, soreness to palpation reproducing his symptoms as described above  HEART:  PMI not displaced or sustained,S1 and S2 within normal limits, no S3, no S4, no clicks, no rubs, no murmurs ABD:  Flat, positive bowel sounds normal in frequency in pitch, no bruits, no rebound, no guarding, no midline pulsatile mass, no hepatomegaly, no splenomegaly EXT:  2 plus pulses throughout, no edema, no cyanosis no clubbing  EKG:  NSR ,rate ***, axis WNL, intervals WNL, no acute ST T wave changes.   ASSESSMENT AND PLAN  CAD: He has had *** no new symptoms.  He had a negative stress test in March of last year.  No change in therapy or further testing is indicated.   If it is absolutely necessary he can hold his Plavix for 5 days prior to his GI procedures.    HTN:  The blood pressure is *** at target. No change in medications is indicated. We will continue with therapeutic lifestyle changes (TLC).  HYPERLIPIDEMIA:   I will send him back to our Lipid Clinic to consider PCSK9.  He stopped taking Repatha.  **** He is intolerant of statin.  Of note his LDL was 110 recently.    DM:  A1C was *** 9.1   I will defer to  Lemmie Evens, MD

## 2017-02-04 ENCOUNTER — Ambulatory Visit: Payer: Medicare Other | Admitting: Cardiology

## 2017-02-04 ENCOUNTER — Encounter: Payer: Self-pay | Admitting: *Deleted

## 2017-02-08 ENCOUNTER — Encounter (INDEPENDENT_AMBULATORY_CARE_PROVIDER_SITE_OTHER): Payer: Self-pay | Admitting: Internal Medicine

## 2017-02-10 ENCOUNTER — Encounter (INDEPENDENT_AMBULATORY_CARE_PROVIDER_SITE_OTHER): Payer: Self-pay | Admitting: Internal Medicine

## 2017-02-10 ENCOUNTER — Ambulatory Visit (INDEPENDENT_AMBULATORY_CARE_PROVIDER_SITE_OTHER): Payer: Medicare Other | Admitting: Internal Medicine

## 2017-02-10 ENCOUNTER — Other Ambulatory Visit (INDEPENDENT_AMBULATORY_CARE_PROVIDER_SITE_OTHER): Payer: Self-pay | Admitting: *Deleted

## 2017-02-10 ENCOUNTER — Encounter (INDEPENDENT_AMBULATORY_CARE_PROVIDER_SITE_OTHER): Payer: Self-pay

## 2017-02-10 VITALS — BP 140/84 | HR 72 | Temp 97.6°F | Ht 69.0 in | Wt 164.0 lb

## 2017-02-10 DIAGNOSIS — K219 Gastro-esophageal reflux disease without esophagitis: Secondary | ICD-10-CM

## 2017-02-10 DIAGNOSIS — K2270 Barrett's esophagus without dysplasia: Secondary | ICD-10-CM

## 2017-02-10 NOTE — Patient Instructions (Signed)
H pylori

## 2017-02-10 NOTE — Progress Notes (Addendum)
Subjective:    Patient ID: [name], male    DOB: Aug 27, 1960, 56 y.o.   MRN: [id number]  HPI  Here  Today starting that Dr. Karie Kirks thinks he may have H. Pylori. He was having epigastric pain radiating into his esophagus. He tells me he took  [place] for his cholesterol in March. He says he started having epigastric pain and esophageal pain.   He thought he had an ulcer. He says now the pain comes and comes. He says the pain in minor now. Last week he drank a sprite and the lodged in his esophagus.  Appetite is okay.  No weight loss.  Has a BM daily most of the time. No melena or BRRB.   03/11/2016 EGD Impression:               - Normal esophagus.                           - Z-line irregular, 38 cm from the incisors.                            Biopsied.                           - No endoscopic esophageal abnormality to explain                            patient's dysphagia. Esophagus dilated. Dilated.                           - Normal stomach.                           - Normal duodenal bulb and second portion of the                            duodenum. Biopsy: Barrett;'s esophagus.   Colonoscopy 03/11/2016 IDA Impression:               - The examined portion of the ileum was normal.                           - Diverticulosis in the sigmoid colon.                           - One 5 mm polyp in the distal sigmoid colon,                            removed with a cold snare. Resected and retrieved.                           - Internal hemorrhoids. Biopsy: Tubular adenoma.  Hx of polycythemia vera.   Review of Systems Past Medical History:  Diagnosis Date  . Anemia   . 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 Chronic gastritis   . Coronary artery disease    Patent Stent to Circ.  Patent coronary bypass grafts with a free radial graft to PDA and LIMA to the  diag.  Marland Kitchen  Diabetes mellitus   . Dyslipidemia    Poorly controlled, probably related to his DM (Some of this is  secondary to his inability to afford medications).  . ED (erectile dysfunction)   . Esophageal yeast infection ([place])   . Metaplasia of esophagus   . Pancreatitis chronic    Pseudocyst  . Polycythemia vera(238.4) 04/26/2012    Past Surgical History:  Procedure Laterality Date  . APPENDECTOMY    . CARDIAC CATHETERIZATION    . CHOLECYSTECTOMY    . COLONOSCOPY  07/31/1985  . COLONOSCOPY N/A 03/11/2016   Procedure: COLONOSCOPY;  Surgeon: Rogene Houston, MD;  [facility];  Service: Endoscopy;  Laterality: N/A;  . CORONARY ANGIOPLASTY WITH STENT PLACEMENT  2006  . CORONARY ARTERY BYPASS GRAFT  11/24/2004  . ESOPHAGOGASTRODUODENOSCOPY  09/19/01  . ESOPHAGOGASTRODUODENOSCOPY N/A 03/11/2016   Procedure: ESOPHAGOGASTRODUODENOSCOPY (EGD);  Surgeon: Rogene Houston, MD;  [facility];  Service: Endoscopy;  Laterality: N/A;  12:00  . FINGER TENDON REPAIR     in middle finger  . KNEE SURGERY     left knee  . NASAL SEPTUM SURGERY      Allergies  Allergen Reactions  . Metoclopramide Hcl Hives    Patient became very spastic  . Iodinated Diagnostic Agents     Rash, nausea and flushing  . Iohexol      Desc: HIVES,SOB NEEDS PREP.MEDS   . Sulfonamide Derivatives     Large hives  . Penicillins Rash    Has patient had a PCN reaction causing immediate rash, facial/tongue/throat swelling, SOB or lightheadedness with hypotension: Yes Has patient had a PCN reaction causing severe rash involving mucus membranes or skin necrosis: No Has patient had a PCN reaction that required hospitalization No Has patient had a PCN reaction occurring within the last 10 years: No. If all of the above answers are "NO", then may proceed with Cephalosporin use.   . Sulfa Antibiotics Rash    Other Reaction: Not Assessed    Current Outpatient Prescriptions on File Prior to Visit  Medication Sig Dispense Refill  . cholecalciferol (VITAMIN D) 1000 units tablet Take 4,000 Units by mouth daily.      . clopidogrel (PLAVIX) 75 MG tablet Take 1 tablet (75 mg total) by mouth daily.    . diazepam (VALIUM) 10 MG tablet Take 10 mg by mouth at bedtime as needed for anxiety or sleep. As Needed    . glipiZIDE (GLUCOTROL) 10 MG tablet Take 10 mg by mouth 2 (two) times daily.     . hydrocodone-acetaminophen (LORCET PLUS) 7.5-650 MG per tablet Take 1 tablet by mouth every 6 (six) hours as needed for pain.     . metFORMIN (GLUCOPHAGE) 1000 MG tablet Take 1,000 mg by mouth 2 (two) times daily with a meal.     . metoprolol tartrate (LOPRESSOR) 25 MG tablet Take 25 mg by mouth 2 (two) times daily.    . naproxen (NAPROSYN) 500 MG tablet Take 500 mg by mouth 2 (two) times daily as needed for mild pain.     . nitroGLYCERIN (NITROSTAT) 0.4 MG SL tablet Place 0.4 mg under the tongue every 5 (five) minutes as needed for chest pain. Reported on 02/26/2016    . NON FORMULARY Domperidone 10 mg Patient takes 2 by mouth daily    . omega-3 acid ethyl esters (LOVAZA) 1 g capsule Take 2 capsules (2 g total) by mouth 2 (two) times daily. 360 capsule 3  . ondansetron (ZOFRAN) 4  MG tablet Take 4 mg by mouth every 8 (eight) hours as needed for nausea or vomiting.    Marland Kitchen OVER THE COUNTER MEDICATION Patient is taking Pancreatic Enzymes 4X -Amylase 50,000 , Lipase 4000,Protease 50 ,000 , Pancreatic Enzymes 4x 50 mg Patient takes 2 by mouth daily.    . pantoprazole (PROTONIX) 40 MG tablet TAKE 1 TABLET BY MOUTH DAILY. (Patient taking differently: TAKE 1 TABLET BY MOUTH ONCE DAILY AS NEEDED FOR INDIGESTION.) 30 tablet 1  . PARoxetine (PAXIL) 10 MG tablet Take 10 mg by mouth daily.      . promethazine (PHENERGAN) 25 MG tablet Take 25 mg by mouth every 8 (eight) hours as needed for nausea or vomiting.      No current facility-administered medications on file prior to visit.         Objective:   Physical Exam Blood pressure 140/84, pulse 72, temperature 97.6 F (36.4 C), height 5\' 9"  (1.753 m), weight 164 lb (74.4 kg).  Alert and  oriented. Skin warm and dry. Oral mucosa is moist.   . Sclera anicteric, conjunctivae is pink. Thyroid not enlarged. No cervical lymphadenopathy. Lungs clear. Heart regular rate and rhythm.  Abdomen is soft. Bowel sounds are positive. No hepatomegaly. No abdominal masses felt. No tenderness.  No edema to lower extremities.          Assessment & Plan:  Take the Protonix daily.  H. Pylori.

## 2017-02-14 LAB — HELICOBACTER PYLORI  SPECIAL ANTIGEN: H. PYLORI ANTIGEN STOOL: NOT DETECTED

## 2017-04-13 ENCOUNTER — Telehealth: Payer: Self-pay | Admitting: Cardiology

## 2017-04-13 NOTE — Telephone Encounter (Signed)
Received records from [place] for appointment on 04/14/17 with Dr Percival Spanish.  Records put with Dr Hochrein's schedule for 04/14/17. lp

## 2017-04-13 NOTE — Progress Notes (Signed)
HPI The patient presents for followup of his known coronary disease.  At the last visit he was having some chest discomfort in 2016. However, stress perfusion study demonstrated an EF of 60% with no ischemia. He returns for follow-up.  Since I last saw him he has done well.  He did have one episode 4 - 5 weeks ago when he needed NTG.  He lifted something or was fixing a lawn mower or something along those lines.  He otherwise does not have these symptoms.  He had to come off of Repatha because of possible pancreatitis.  He stopped Enalapril secondary to cough.  The patient denies any new symptoms such as PND or orthopnea. There have been no reported palpitations, presyncope or syncope.  Allergies  Allergen Reactions  . Metoclopramide Hcl Hives    Patient became very spastic  . Enalapril Cough  . Iodinated Diagnostic Agents     Rash, nausea and flushing  . Iohexol      Desc: HIVES,SOB NEEDS PREP.MEDS   . Sulfonamide Derivatives     Large hives  . Penicillins Rash    Has patient had a PCN reaction causing immediate rash, facial/tongue/throat swelling, SOB or lightheadedness with hypotension: Yes Has patient had a PCN reaction causing severe rash involving mucus membranes or skin necrosis: No Has patient had a PCN reaction that required hospitalization No Has patient had a PCN reaction occurring within the last 10 years: No. If all of the above answers are "NO", then may proceed with Cephalosporin use.   . Sulfa Antibiotics Rash    Other Reaction: Not Assessed    Current Outpatient Prescriptions  Medication Sig Dispense Refill  . clopidogrel (PLAVIX) 75 MG tablet Take 1 tablet (75 mg total) by mouth daily.    . diazepam (VALIUM) 10 MG tablet Take 10 mg by mouth at bedtime as needed for anxiety or sleep. As Needed    . glipiZIDE (GLUCOTROL) 10 MG tablet Take 10 mg by mouth 2 (two) times daily.     . hydrochlorothiazide (HYDRODIURIL) 12.5 MG tablet Take 1 tablet by mouth daily.      . hydrocodone-acetaminophen (LORCET PLUS) 7.5-650 MG per tablet Take 1 tablet by mouth every 6 (six) hours as needed for pain.     Marland Kitchen insulin NPH-regular Human (NOVOLIN 70/30) (70-30) 100 UNIT/ML injection Inject 40 Units into the skin.    . metFORMIN (GLUCOPHAGE) 1000 MG tablet Take 1,000 mg by mouth 2 (two) times daily with a meal.     . metoprolol tartrate (LOPRESSOR) 25 MG tablet Take 25 mg by mouth 2 (two) times daily.    . naproxen (NAPROSYN) 500 MG tablet Take 500 mg by mouth 2 (two) times daily as needed for mild pain.     . nitroGLYCERIN (NITROSTAT) 0.4 MG SL tablet Place 0.4 mg under the tongue every 5 (five) minutes as needed for chest pain. Reported on 02/26/2016    . NON FORMULARY Domperidone 10 mg Patient takes 2 by mouth daily    . omega-3 acid ethyl esters (LOVAZA) 1 g capsule Take 2 capsules (2 g total) by mouth 2 (two) times daily. 360 capsule 3  . ondansetron (ZOFRAN) 4 MG tablet Take 4 mg by mouth every 8 (eight) hours as needed for nausea or vomiting.    Marland Kitchen OVER THE COUNTER MEDICATION Patient is taking Pancreatic Enzymes 4X -Amylase 50,000 , Lipase 4000,Protease 50 ,000 , Pancreatic Enzymes 4x 50 mg Patient takes 2 by mouth daily.    Marland Kitchen  pantoprazole (PROTONIX) 40 MG tablet TAKE 1 TABLET BY MOUTH DAILY. (Patient taking differently: TAKE 1 TABLET BY MOUTH ONCE DAILY AS NEEDED FOR INDIGESTION.) 30 tablet 1  . PARoxetine (PAXIL) 10 MG tablet Take 10 mg by mouth daily.      . promethazine (PHENERGAN) 25 MG tablet Take 25 mg by mouth every 8 (eight) hours as needed for nausea or vomiting.     Marland Kitchen zolpidem (AMBIEN) 5 MG tablet Take 1 tablet by mouth as needed.     No current facility-administered medications for this visit.     Past Medical History:  Diagnosis Date  . Anemia   . 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 Chronic gastritis   . Coronary artery disease    Patent Stent to Circ.  Patent coronary bypass grafts with a free radial graft to PDA and LIMA to the  diag.  .  Diabetes mellitus   . Dyslipidemia    Poorly controlled, probably related to his DM (Some of this is secondary to his inability to afford medications).  . ED (erectile dysfunction)   . Esophageal yeast infection ([place])   . Metaplasia of esophagus   . Pancreatitis chronic    Pseudocyst  . Polycythemia vera(238.4) 04/26/2012    Past Surgical History:  Procedure Laterality Date  . APPENDECTOMY    . CARDIAC CATHETERIZATION    . CHOLECYSTECTOMY    . COLONOSCOPY  07/31/1985  . COLONOSCOPY N/A 03/11/2016   Procedure: COLONOSCOPY;  Surgeon: Rogene Houston, MD;  [facility];  Service: Endoscopy;  Laterality: N/A;  . CORONARY ANGIOPLASTY WITH STENT PLACEMENT  2006  . CORONARY ARTERY BYPASS GRAFT  11/24/2004  . ESOPHAGOGASTRODUODENOSCOPY  09/19/01  . ESOPHAGOGASTRODUODENOSCOPY N/A 03/11/2016   Procedure: ESOPHAGOGASTRODUODENOSCOPY (EGD);  Surgeon: Rogene Houston, MD;  [facility];  Service: Endoscopy;  Laterality: N/A;  12:00  . FINGER TENDON REPAIR     in middle finger  . KNEE SURGERY     left knee  . NASAL SEPTUM SURGERY      ROS  As stated in the HPI and negative for all other systems.  PHYSICAL EXAM BP 124/86 (BP Location: Left Arm, Patient Position: Sitting, Cuff Size: Normal)   Pulse 61   Ht 5\' 9"  (1.753 m)   Wt 165 lb (74.8 kg)   BMI 24.37 kg/m   GENERAL:  Well appearing NECK:  No jugular venous distention, waveform within normal limits, carotid upstroke brisk and symmetric, no bruits, no thyromegaly LUNGS:  Clear to auscultation bilaterally CHEST:  Well healed sternotomy scar. HEART:  PMI not displaced or sustained,S1 and S2 within normal limits, no S3, no S4, no clicks, no rubs, no murmurs ABD:  Flat, positive bowel sounds normal in frequency in pitch, no bruits, no rebound, no guarding, no midline pulsatile mass, no hepatomegaly, no splenomegaly EXT:  2 plus pulses throughout, no edema, no cyanosis no clubbing   EKG:  NSR ,rate 61, axis WNL,  intervals WNL, no acute ST T wave changes.  RSR' suggestive of RV conduction delay.   04/16/2017    Lab Results  Component Value Date   CHOL 262 (H) 08/19/2016   TRIG 433 (H) 08/19/2016   HDL 38 (L) 08/19/2016   LDLCALC NOT CALC 08/19/2016   LDLDIRECT 110 08/19/2016     ASSESSMENT AND PLAN  CAD: The patient has no new sypmtoms.  No further cardiovascular testing is indicated.  We will continue with aggressive risk reduction and  meds as listed.  HTN:  The blood pressure is at target. No change in medications is indicated. We will continue with therapeutic lifestyle changes (TLC).  HYPERLIPIDEMIA:   He was intolerant of Repatha and statins.  I brought our pharmacist around to talk to him about possible clinic trials.    DM: His last A1C was 8.  Previously it had been 9.1  He will follow with Lemmie Evens, MD

## 2017-04-14 ENCOUNTER — Ambulatory Visit (INDEPENDENT_AMBULATORY_CARE_PROVIDER_SITE_OTHER): Payer: Medicare Other | Admitting: Cardiology

## 2017-04-14 ENCOUNTER — Encounter: Payer: Self-pay | Admitting: Cardiology

## 2017-04-14 VITALS — BP 124/86 | HR 61 | Ht 69.0 in | Wt 165.0 lb

## 2017-04-14 DIAGNOSIS — I10 Essential (primary) hypertension: Secondary | ICD-10-CM

## 2017-04-14 DIAGNOSIS — E785 Hyperlipidemia, unspecified: Secondary | ICD-10-CM | POA: Diagnosis not present

## 2017-04-14 DIAGNOSIS — I25118 Atherosclerotic heart disease of native coronary artery with other forms of angina pectoris: Secondary | ICD-10-CM

## 2017-04-14 NOTE — Patient Instructions (Addendum)
Medication Instructions:  Continue current medications  If you need a refill on your cardiac medications before your next appointment, please call your pharmacy.  Labwork: None Ordered   Testing/Procedures: None Ordered  Follow-Up: Your physician wants you to follow-up in: 1 Year. You should receive a reminder letter in the mail two months in advance. If you do not receive a letter, please call our office [phone].    Thank you for choosing [facility]!!      

## 2017-04-16 ENCOUNTER — Encounter: Payer: Self-pay | Admitting: Cardiology

## 2017-04-16 DIAGNOSIS — I25118 Atherosclerotic heart disease of native coronary artery with other forms of angina pectoris: Secondary | ICD-10-CM | POA: Insufficient documentation

## 2017-07-06 ENCOUNTER — Other Ambulatory Visit (INDEPENDENT_AMBULATORY_CARE_PROVIDER_SITE_OTHER): Payer: Self-pay | Admitting: *Deleted

## 2017-07-06 ENCOUNTER — Telehealth (INDEPENDENT_AMBULATORY_CARE_PROVIDER_SITE_OTHER): Payer: Self-pay | Admitting: *Deleted

## 2017-07-06 DIAGNOSIS — R110 Nausea: Secondary | ICD-10-CM

## 2017-07-06 DIAGNOSIS — R109 Unspecified abdominal pain: Secondary | ICD-10-CM

## 2017-07-06 DIAGNOSIS — R531 Weakness: Secondary | ICD-10-CM

## 2017-07-06 NOTE — Telephone Encounter (Signed)
Patient called and states that for a few days he has felt really bad. Abdominal pain, says that it feels very sore , bulky,like something heavy is laying in there. He says that he is having to breathe hard due to the pain . He has had some nausea. Yesterday her took 1/2 of pain medication and took another 1/2 last night. He also took a Zofran for the nausea. He has not had any fever.  Patient's call back number is [phone].  Dr.Rehman has been called and he will be returning the call. Per Dr.Rehman the patient may see Terri on 07/07/2017. Do the following labs :CBC , Amylase,Lipasr, Comprehensive Panel.  Patient may also see his PCP,Dr.Knowlton ,however he is closed on Thursday. If symptoms worsen the patient is to go to the ED. Patient was made aware.

## 2017-07-08 LAB — COMPREHENSIVE METABOLIC PANEL
AG Ratio: 1.8 (calc) (ref 1.0–2.5)
ALBUMIN MSPROF: 4.1 g/dL (ref 3.6–5.1)
ALT: 10 U/L (ref 9–46)
AST: 15 U/L (ref 10–35)
Alkaline phosphatase (APISO): 47 U/L (ref 40–115)
BUN: 19 mg/dL (ref 7–25)
CHLORIDE: 100 mmol/L (ref 98–110)
CO2: 26 mmol/L (ref 20–32)
CREATININE: 1.27 mg/dL (ref 0.70–1.33)
Calcium: 9 mg/dL (ref 8.6–10.3)
GLOBULIN: 2.3 g/dL (ref 1.9–3.7)
GLUCOSE: 154 mg/dL — AB (ref 65–139)
POTASSIUM: 4.5 mmol/L (ref 3.5–5.3)
Sodium: 135 mmol/L (ref 135–146)
Total Bilirubin: 0.7 mg/dL (ref 0.2–1.2)
Total Protein: 6.4 g/dL (ref 6.1–8.1)

## 2017-07-08 LAB — LIPASE: Lipase: 33 U/L (ref 7–60)

## 2017-07-08 LAB — CBC
HEMATOCRIT: 37.3 % — AB (ref 38.5–50.0)
HEMOGLOBIN: 11.5 g/dL — AB (ref 13.2–17.1)
MCH: 22.6 pg — AB (ref 27.0–33.0)
MCHC: 30.8 g/dL — ABNORMAL LOW (ref 32.0–36.0)
MCV: 73.4 fL — AB (ref 80.0–100.0)
MPV: 10.6 fL (ref 7.5–12.5)
Platelets: 247 10*3/uL (ref 140–400)
RBC: 5.08 10*6/uL (ref 4.20–5.80)
RDW: 16.8 % — ABNORMAL HIGH (ref 11.0–15.0)
WBC: 5.1 10*3/uL (ref 3.8–10.8)

## 2017-07-08 LAB — AMYLASE: AMYLASE: 36 U/L (ref 21–101)

## 2017-07-11 ENCOUNTER — Ambulatory Visit (INDEPENDENT_AMBULATORY_CARE_PROVIDER_SITE_OTHER): Payer: Medicare Other | Admitting: Internal Medicine

## 2017-07-11 ENCOUNTER — Encounter (INDEPENDENT_AMBULATORY_CARE_PROVIDER_SITE_OTHER): Payer: Self-pay | Admitting: Internal Medicine

## 2017-07-11 ENCOUNTER — Encounter (INDEPENDENT_AMBULATORY_CARE_PROVIDER_SITE_OTHER): Payer: Self-pay

## 2017-07-11 ENCOUNTER — Other Ambulatory Visit (INDEPENDENT_AMBULATORY_CARE_PROVIDER_SITE_OTHER): Payer: Self-pay | Admitting: Internal Medicine

## 2017-07-11 VITALS — BP 124/88 | HR 64 | Temp 97.8°F | Resp 18 | Ht 69.0 in | Wt 166.2 lb

## 2017-07-11 DIAGNOSIS — K861 Other chronic pancreatitis: Secondary | ICD-10-CM

## 2017-07-11 DIAGNOSIS — K3184 Gastroparesis: Secondary | ICD-10-CM

## 2017-07-11 DIAGNOSIS — D509 Iron deficiency anemia, unspecified: Secondary | ICD-10-CM

## 2017-07-11 DIAGNOSIS — E1143 Type 2 diabetes mellitus with diabetic autonomic (poly)neuropathy: Secondary | ICD-10-CM

## 2017-07-11 DIAGNOSIS — R1084 Generalized abdominal pain: Secondary | ICD-10-CM | POA: Diagnosis not present

## 2017-07-11 NOTE — Progress Notes (Signed)
Presenting complaint;  Abdominal pain nausea and weakness.  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 Lab studies are obtained and this visit was scheduled.  Patient states he has been having abdominal pain for the last 3 months.  Pain is not experience every day but may be every other day or couple of times a week.  It starts in the mid abdomen and radiates laterally and then becomes generalized.  He takes pain medication when pain is intense.  This pain is associated with sick feeling which is relieved with ondansetron or Phenergan.  Although he has been nauseated every day he has not vomited.  He states he is able to control the symptoms by taking medication and able to prevent vomiting.  He is taking pantoprazole on an as-needed basis.  Similarly he takes domperidone on as-needed basis rather than daily.  His bowels are irregular.  He has periods of constipation diarrhea and are normal.  When he has loose stools he backs off on domperidone.  Also complains of intractable heartburn every 8-9 days relieved with plain yogurt green tea and he also backs off cough event that this occurs.  He stays away from drinks containing aspartame. He also complains of weakness with physical activity he had no problems before.  He is having phlebotomy every 62 days.  Last phlebotomy was week before last and his hemoglobin was close to 14 g.  He had a EGD in July last year for history of melena and heme positive stool and dysphagia.  EGD was normal except serrated GE junction.  Esophagus was dilated by passing 54 [place] Mal[place] dilator.  Biopsy from serrated GE junction revealed Barrett's mucosa. He also had colonoscopy at the time revealing mild sigmoid colon  diverticulosis and small pedunculated polyp was removed from sigmoid colon.  Polyp was tubular adenoma.  He also had small internal hemorrhoids. He does not take pain medication every day.  He takes pain medication for abdominal as well as chest pain.  When he has chest pain he first takes nitroglycerin and then hydrocodone. He does not take more than 5 or 6 doses of naproxen in a month.  He has not experienced melena or rectal bleeding this year. He has not lost any weight in the last 5 months. H. pylori testing in June 2018 was negative.   Current Medications: Outpatient Encounter Medications as of 07/11/2017  Medication Sig  . clopidogrel (PLAVIX) 75 MG tablet Take 1 tablet (75 mg total) by mouth daily.  . diazepam (VALIUM) 10 MG tablet Take 10 mg by mouth at bedtime as needed for anxiety or sleep. As Needed  . glipiZIDE (GLUCOTROL) 10 MG tablet Take 10 mg by mouth 2 (two) times daily.   . hydrochlorothiazide (HYDRODIURIL) 12.5 MG tablet Take 1 tablet by mouth daily.  Marland Kitchen HYDROcodone-acetaminophen (NORCO) 7.5-325 MG tablet Take 1 tablet every 4 (four) hours as needed by mouth for severe pain.   Marland Kitchen insulin NPH-regular Human (NOVOLIN 70/30) (70-30) 100 UNIT/ML injection Inject 40 Units into the skin.  . metFORMIN (GLUCOPHAGE) 1000 MG tablet Take 1,000 mg by mouth 2 (two) times daily with a meal.   . metoprolol tartrate (LOPRESSOR) 25 MG tablet Take 25 mg by mouth 2 (two) times daily.  . naproxen (NAPROSYN) 500 MG tablet Take 500  mg by mouth 2 (two) times daily as needed for mild pain.   . nitroGLYCERIN (NITROSTAT) 0.4 MG SL tablet Place 0.4 mg under the tongue every 5 (five) minutes as needed for chest pain. Reported on 02/26/2016  . NON FORMULARY Domperidone 10 mg Patient takes 2 by mouth daily  . omega-3 acid ethyl esters (LOVAZA) 1 g capsule Take 2 capsules (2 g total) by mouth 2 (two) times daily.  . ondansetron (ZOFRAN) 4 MG tablet Take 4 mg by mouth every 8 (eight) hours as needed for nausea  or vomiting.  Marland Kitchen OVER THE COUNTER MEDICATION Patient is taking Pancreatic Enzymes 4X -Amylase 50,000 , Lipase 4000,Protease 50 ,000 , Pancreatic Enzymes 4x 50 mg Patient takes 2 by mouth daily.  . pantoprazole (PROTONIX) 40 MG tablet TAKE 1 TABLET BY MOUTH DAILY. (Patient taking differently: TAKE 1 TABLET BY MOUTH ONCE DAILY AS NEEDED FOR INDIGESTION.)  . PARoxetine (PAXIL) 10 MG tablet Take 10 mg by mouth daily.    . promethazine (PHENERGAN) 25 MG tablet Take 25 mg by mouth every 8 (eight) hours as needed for nausea or vomiting.   Marland Kitchen zolpidem (AMBIEN) 5 MG tablet Take 1 tablet by mouth as needed.  . [DISCONTINUED] hydrocodone-acetaminophen (LORCET PLUS) 7.5-650 MG per tablet Take 1 tablet by mouth every 6 (six) hours as needed for pain.    No facility-administered encounter medications on file as of 07/11/2017.      Objective: Blood pressure 124/88, pulse 64, temperature 97.8 F (36.6 C), temperature source Oral, resp. rate 18, height 5\' 9"  (1.753 m), weight 166 lb 3.2 oz (75.4 kg). Patient is alert and in no acute distress. Conjunctiva is pink. Sclera is nonicteric Oropharyngeal mucosa is normal. No neck masses or thyromegaly noted. Cardiac exam with regular rhythm normal S1 and S2. No murmur or gallop noted. Lungs are clear to auscultation. Abdomen is symmetrical with lower midline scar as well as small scar in the right lower quadrant.  Bowel sounds are normal.  No bruits noted.  He has some mild generalized tenderness which is more pronounced in epigastric region.  No organomegaly or masses. No LE edema or clubbing noted.  Labs/studies Results:  Recent Labs    July 17, 2017 1343  WBC 5.1  HGB 11.5*  HCT 37.3*  PLT 247    BMET  Recent Labs    2017/07/17 1343  NA 135  K 4.5  CL 100  CO2 26  GLUCOSE 154*  BUN 19  CREATININE 1.27  CALCIUM 9.0    LFT  Recent Labs    2017/07/17 1343  PROT 6.4  AST 15  ALT 10  BILITOT 0.7     Serum amylase 36  Serum lipase 33  CT  images from 03/28/2012 reviewed. He had calcified cystic pancreatic tail and left-sided colonic diverticulosis.   Assessment:  #1.  Generalized abdominal pain.  Most of his pain is centered in mid abdomen and epigastric region.  Markers for acute pancreatitis are normal.  Abdominal pain appears to be multifactorial.  It is due to chronic pancreatitis he may also have an IBS, and gastroparesis.  He needs to be rescanned as he is at risk for enlargement of pancreatic cyst or carcinoma.  #2.  Chronic pancreatitis.  Etiology fell established to be familial hypertriglyceridemia.  Pancreatic ductal stricture needs to be ruled out.  #3.  Diabetic gastroparesis.  Patient is not taking medications correctly.  He should take PPI daily and he should also take domperidone every day and we  can titrate dose.  #4.  Microcytic anemia.  Patient has polycythemia vera and undergoes phlebotomy every 62 days.  He has just about quit eating red meat.  He has developed iron deficiency due to phlebotomy and diminished intake of iron.  He can take 1 iron pill daily.  He has ferrous sulfate. He may also want to check with his hematologist in oncology clinic and either [place]:  Patient advised to take pantoprazole 40 mg daily. She will advised to take domperidone daily.  He can try 5 mg before each meal. Schedule abdominopelvic CT with contrast. Office visit in 3 months.

## 2017-07-11 NOTE — Patient Instructions (Addendum)
Take pantoprazole daily. Take domperidone daily as discussed; can reduce dose to 5 mg before each meal. Physician will call with results of CT when completed.

## 2017-07-18 ENCOUNTER — Encounter (INDEPENDENT_AMBULATORY_CARE_PROVIDER_SITE_OTHER): Payer: Self-pay | Admitting: Internal Medicine

## 2017-07-21 ENCOUNTER — Ambulatory Visit (HOSPITAL_COMMUNITY)
Admission: RE | Admit: 2017-07-21 | Discharge: 2017-07-21 | Disposition: A | Discharge status: Home / Self Care | Payer: Medicare Other | Source: Ambulatory Visit | Attending: Internal Medicine | Admitting: Internal Medicine

## 2017-07-21 DIAGNOSIS — K861 Other chronic pancreatitis: Secondary | ICD-10-CM | POA: Diagnosis not present

## 2017-07-21 DIAGNOSIS — K5730 Diverticulosis of large intestine without perforation or abscess without bleeding: Secondary | ICD-10-CM | POA: Diagnosis not present

## 2017-07-21 DIAGNOSIS — E781 Pure hyperglyceridemia: Secondary | ICD-10-CM | POA: Diagnosis not present

## 2017-07-21 DIAGNOSIS — Z9049 Acquired absence of other specified parts of digestive tract: Secondary | ICD-10-CM | POA: Diagnosis not present

## 2017-07-21 DIAGNOSIS — R1084 Generalized abdominal pain: Secondary | ICD-10-CM | POA: Diagnosis present

## 2017-07-21 MED ORDER — IOPAMIDOL (ISOVUE-300) INJECTION 61%
100.0000 mL | Freq: Once | INTRAVENOUS | Status: AC | PRN
  Administered 2017-07-21: 100 mL via INTRAVENOUS

## 2017-07-27 ENCOUNTER — Other Ambulatory Visit (INDEPENDENT_AMBULATORY_CARE_PROVIDER_SITE_OTHER): Payer: Self-pay | Admitting: *Deleted

## 2017-07-27 DIAGNOSIS — E781 Pure hyperglyceridemia: Secondary | ICD-10-CM

## 2017-08-03 ENCOUNTER — Other Ambulatory Visit: Payer: Self-pay | Admitting: Cardiology

## 2017-08-03 ENCOUNTER — Other Ambulatory Visit: Payer: Self-pay

## 2017-08-03 NOTE — Telephone Encounter (Signed)
REFILL 

## 2017-10-13 ENCOUNTER — Encounter (INDEPENDENT_AMBULATORY_CARE_PROVIDER_SITE_OTHER): Payer: Self-pay | Admitting: Internal Medicine

## 2017-10-13 ENCOUNTER — Ambulatory Visit (INDEPENDENT_AMBULATORY_CARE_PROVIDER_SITE_OTHER): Payer: Medicare Other | Admitting: Internal Medicine

## 2017-10-13 VITALS — BP 130/90 | HR 64 | Temp 97.2°F | Resp 18 | Ht 69.0 in | Wt 162.4 lb

## 2017-10-13 DIAGNOSIS — K861 Other chronic pancreatitis: Secondary | ICD-10-CM | POA: Diagnosis not present

## 2017-10-13 DIAGNOSIS — K219 Gastro-esophageal reflux disease without esophagitis: Secondary | ICD-10-CM

## 2017-10-13 DIAGNOSIS — K3184 Gastroparesis: Secondary | ICD-10-CM | POA: Diagnosis not present

## 2017-10-13 DIAGNOSIS — E1143 Type 2 diabetes mellitus with diabetic autonomic (poly)neuropathy: Secondary | ICD-10-CM

## 2017-10-13 NOTE — Patient Instructions (Signed)
Can take an extra dose of domperidone at bedtime if needed. Lipid panel as planned by Dr. Karie Kirks.

## 2017-10-13 NOTE — Progress Notes (Signed)
Presenting complaint;  Follow-up for chronic pancreatitis, gastroparesis and GERD.  Subjective:  Patient is 57 year old Caucasian male with multiple GI problems who was last seen on 07/11/2017 for abdominal pain.  Abdominopelvic CT revealed calcified cystic structure in the tail of pancreas unchanged from prior study of 2013.  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 LFTs amylase and lipase were normal. Patient was advised to take pantoprazole daily instead of as needed and similarly was advised to take domperidone and schedule rather than as needed. He had lipid profile on 07/11/2017 at Dr. Vickey Sages office.  Serum cholesterol was 304 HDL was 23 triglycerides 1987.  A1c was 9.3. States he had follow-up testing in December 2018 and lipase was 1200.  He continues to have intermittent upper abdominal pain.  He has not had an episode of severe pain like he had last year.  He had an episode about a week ago.  He took pain medication and felt better.  He has some pain daily.  He also complains of lower abdominal pain when he is constipated. He states he has changes diet completely.  He has cut back on meat intake.  He has lost 4 pounds since his last visit.  He is not able to exercise because of intractable pain due to peripheral neuropathy.  Gabapentin helped but he does not take it because of side effects. Is planning to have lipid panel repeated in the next few weeks. Complains of intermittent chest pain.  He states he had an episode when he woke up this morning and was relieved with nitroglycerin. He remains on OTC pancreatic enzyme supplement which has helped decrease epigastric pain. He denies melena or rectal bleeding.  Having phlebotomy about every 2 months.  He states his hemoglobin has dropped and he is taking p.o. iron every now and then. He states he does not take more than 2-3 doses of Naprosyn per week.  Except primarily for peripheral neuropathy.   Current Medications: Outpatient  Encounter Medications as of 10/13/2017  Medication Sig  . clopidogrel (PLAVIX) 75 MG tablet Take 1 tablet (75 mg total) by mouth daily.  . diazepam (VALIUM) 10 MG tablet Take 10 mg by mouth at bedtime as needed for anxiety or sleep. As Needed  . glipiZIDE (GLUCOTROL) 10 MG tablet Take 10 mg by mouth 2 (two) times daily.   . hydrochlorothiazide (HYDRODIURIL) 12.5 MG tablet Take 1 tablet by mouth daily.  Marland Kitchen HYDROcodone-acetaminophen (NORCO) 10-325 MG tablet Take 1 tablet every 4 (four) hours as needed by mouth for severe pain.   Marland Kitchen insulin NPH-regular Human (NOVOLIN 70/30) (70-30) 100 UNIT/ML injection Inject 40 Units into the skin.  . metFORMIN (GLUCOPHAGE) 1000 MG tablet Take 1,000 mg by mouth 2 (two) times daily with a meal.   . metoprolol tartrate (LOPRESSOR) 25 MG tablet Take 25 mg by mouth 2 (two) times daily.  . naproxen (NAPROSYN) 500 MG tablet Take 500 mg by mouth 2 (two) times daily as needed for mild pain.   . nitroGLYCERIN (NITROSTAT) 0.4 MG SL tablet Place 0.4 mg under the tongue every 5 (five) minutes as needed for chest pain. Reported on 02/26/2016  . NON FORMULARY Domperidone 10 mg Patient takes 2 by mouth daily  . omega-3 acid ethyl esters (LOVAZA) 1 g capsule TAKE TWO CAPSULES BY MOUTH TWICE DAILY  . ondansetron (ZOFRAN) 4 MG tablet Take 4 mg by mouth every 8 (eight) hours as needed for nausea or vomiting.  Marland Kitchen OVER THE COUNTER MEDICATION Patient is  taking Pancreatic Enzymes 4X -Amylase 50,000 , Lipase 4000,Protease 50 ,000 , Pancreatic Enzymes 4x 50 mg Patient takes 2 by mouth daily.  . pantoprazole (PROTONIX) 40 MG tablet TAKE 1 TABLET BY MOUTH DAILY. (Patient taking differently: TAKE 1 TABLET BY MOUTH ONCE DAILY AS NEEDED FOR INDIGESTION.)  . PARoxetine (PAXIL) 10 MG tablet Take 10 mg by mouth daily.    . promethazine (PHENERGAN) 25 MG tablet Take 25 mg by mouth every 8 (eight) hours as needed for nausea or vomiting.   Marland Kitchen zolpidem (AMBIEN) 5 MG tablet Take 1 tablet by mouth as needed.    No facility-administered encounter medications on file as of 10/13/2017.      Objective: Blood pressure 130/90, pulse 64, temperature (!) 97.2 F (36.2 C), temperature source Oral, resp. rate 18, height 5\' 9"  (1.753 m), weight 162 lb 6.4 oz (73.7 kg). Patient is alert and in no acute distress. Conjunctiva is pink. Sclera is nonicteric Oropharyngeal mucosa is normal. No neck masses or thyromegaly noted. Cardiac exam with regular rhythm normal S1 and S2. No murmur or gallop noted. Lungs are clear to auscultation. Abdomen is symmetrical.  Bowel sounds are normal.  He does not have succussion splash over epigastric region.  He has lower midline scar.  Abdomen is soft.  He has mild tenderness in epigastric region as well as left upper quadrant.  No organomegaly or masses. No LE edema or clubbing noted.  Labs/studies Results:  Lipid profile from 07/11/2017 as above.  CT images from 07/21/2017 reviewed with patient.  Large stomach with contrast and food debris and bilobed calcified pancreatic pseudocyst.  Assessment:  #1.  Chronic pancreatitis.  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  He has ossified bilobed cystic lesion in tail of pancreas.  This was also noted on prior study of August 2013 and felt to be a cyst and not a neoplasm.  Regular follow-up imaging not necessary.  Since he is at risk for pancreatic neoplasm would consider repeat study in 1 year.  #2.  Chronic GERD.  He had a EGD back in July 2017 and was noted to have short segment Barrett's esophagus.  He therefore should take pantoprazole daily rather than as needed basis.  #3.  Diabetic gastroparesis.  Patient will continue domperidone.  He can take third dose at bedtime on as-needed basis.  #4.  Familial hypertriglyceridemia.  Triglyceride level remains very high as well as serum cholesterol.  He is at risk for multiple sequelae including another bout of pancreatitis.  He must work hard to bring his triglyceride level  below    Plan:  Advised to take pantoprazole 40 mg p.o. every morning. Continue domperidone 10 mg before breakfast and evening meal and take third dose on as-needed basis at bedtime. Lipid profile in the next few weeks as planned. Office visit in 1 year.

## 2018-01-08 ENCOUNTER — Emergency Department (HOSPITAL_COMMUNITY): Payer: Medicare Other

## 2018-01-08 ENCOUNTER — Encounter (HOSPITAL_COMMUNITY): Payer: Self-pay | Admitting: Emergency Medicine

## 2018-01-08 ENCOUNTER — Emergency Department (HOSPITAL_COMMUNITY)
Admission: EM | Admit: 2018-01-08 | Discharge: 2018-01-08 | Disposition: A | Discharge status: Home / Self Care | Payer: Medicare Other | Attending: Emergency Medicine | Admitting: Emergency Medicine

## 2018-01-08 DIAGNOSIS — S81012A Laceration without foreign body, left knee, initial encounter: Secondary | ICD-10-CM | POA: Diagnosis not present

## 2018-01-08 DIAGNOSIS — Y92007 Garden or yard of unspecified non-institutional (private) residence as the place of occurrence of the external cause: Secondary | ICD-10-CM | POA: Diagnosis not present

## 2018-01-08 DIAGNOSIS — Y93H9 Activity, other involving exterior property and land maintenance, building and construction: Secondary | ICD-10-CM | POA: Diagnosis not present

## 2018-01-08 DIAGNOSIS — Z7902 Long term (current) use of antithrombotics/antiplatelets: Secondary | ICD-10-CM | POA: Insufficient documentation

## 2018-01-08 DIAGNOSIS — M79651 Pain in right thigh: Secondary | ICD-10-CM | POA: Insufficient documentation

## 2018-01-08 DIAGNOSIS — W11XXXA Fall on and from ladder, initial encounter: Secondary | ICD-10-CM | POA: Diagnosis not present

## 2018-01-08 DIAGNOSIS — M545 Low back pain: Secondary | ICD-10-CM | POA: Diagnosis not present

## 2018-01-08 DIAGNOSIS — R079 Chest pain, unspecified: Secondary | ICD-10-CM | POA: Insufficient documentation

## 2018-01-08 DIAGNOSIS — I2510 Atherosclerotic heart disease of native coronary artery without angina pectoris: Secondary | ICD-10-CM | POA: Diagnosis not present

## 2018-01-08 DIAGNOSIS — Z951 Presence of aortocoronary bypass graft: Secondary | ICD-10-CM | POA: Insufficient documentation

## 2018-01-08 DIAGNOSIS — W19XXXA Unspecified fall, initial encounter: Secondary | ICD-10-CM

## 2018-01-08 DIAGNOSIS — Y998 Other external cause status: Secondary | ICD-10-CM | POA: Diagnosis not present

## 2018-01-08 DIAGNOSIS — Z794 Long term (current) use of insulin: Secondary | ICD-10-CM | POA: Diagnosis not present

## 2018-01-08 DIAGNOSIS — I10 Essential (primary) hypertension: Secondary | ICD-10-CM | POA: Diagnosis not present

## 2018-01-08 DIAGNOSIS — E119 Type 2 diabetes mellitus without complications: Secondary | ICD-10-CM | POA: Insufficient documentation

## 2018-01-08 LAB — CBG MONITORING, ED
GLUCOSE-CAPILLARY: 253 mg/dL — AB (ref 65–99)
Glucose-Capillary: 307 mg/dL — ABNORMAL HIGH (ref 65–99)

## 2018-01-08 LAB — COMPREHENSIVE METABOLIC PANEL
ALBUMIN: 3.8 g/dL (ref 3.5–5.0)
ALK PHOS: 47 U/L (ref 38–126)
ALT: 20 U/L (ref 17–63)
ANION GAP: 13 (ref 5–15)
AST: 35 U/L (ref 15–41)
BILIRUBIN TOTAL: 1.3 mg/dL — AB (ref 0.3–1.2)
BUN: 25 mg/dL — AB (ref 6–20)
CALCIUM: 9.4 mg/dL (ref 8.9–10.3)
CO2: 20 mmol/L — AB (ref 22–32)
Chloride: 103 mmol/L (ref 101–111)
Creatinine, Ser: 1.53 mg/dL — ABNORMAL HIGH (ref 0.61–1.24)
GFR calc Af Amer: 57 mL/min — ABNORMAL LOW (ref >60)
GFR calc non Af Amer: 50 mL/min — ABNORMAL LOW (ref >60)
GLUCOSE: 416 mg/dL — AB (ref 65–99)
Potassium: 5 mmol/L (ref 3.5–5.1)
SODIUM: 136 mmol/L (ref 135–145)
TOTAL PROTEIN: 6.7 g/dL (ref 6.5–8.1)

## 2018-01-08 LAB — URINALYSIS, ROUTINE W REFLEX MICROSCOPIC
BACTERIA UA: NONE SEEN
Bilirubin Urine: NEGATIVE
Glucose, UA: >=500 mg/dL — AB
Hgb urine dipstick: NEGATIVE
Ketones, ur: 5 mg/dL — AB
Leukocytes, UA: NEGATIVE
NITRITE: NEGATIVE
PH: 5 (ref 5.0–8.0)
Protein, ur: NEGATIVE mg/dL
SPECIFIC GRAVITY, URINE: 1.022 (ref 1.005–1.030)

## 2018-01-08 LAB — CBC WITH DIFFERENTIAL/PLATELET
BASOS PCT: 0 %
Basophils Absolute: 0 10*3/uL (ref 0.0–0.1)
EOS PCT: 2 %
Eosinophils Absolute: 0.1 10*3/uL (ref 0.0–0.7)
HEMATOCRIT: 40.2 % (ref 39.0–52.0)
Hemoglobin: 12.1 g/dL — ABNORMAL LOW (ref 13.0–17.0)
Lymphocytes Relative: 22 %
Lymphs Abs: 1 10*3/uL (ref 0.7–4.0)
MCH: 21.5 pg — ABNORMAL LOW (ref 26.0–34.0)
MCHC: 30.1 g/dL (ref 30.0–36.0)
MCV: 71.5 fL — AB (ref 78.0–100.0)
MONO ABS: 0.3 10*3/uL (ref 0.1–1.0)
MONOS PCT: 6 %
NEUTROS ABS: 3.2 10*3/uL (ref 1.7–7.7)
Neutrophils Relative %: 70 %
PLATELETS: 164 10*3/uL (ref 150–400)
RBC: 5.62 MIL/uL (ref 4.22–5.81)
RDW: 19 % — AB (ref 11.5–15.5)
WBC: 4.6 10*3/uL (ref 4.0–10.5)

## 2018-01-08 MED ORDER — BACITRACIN ZINC 500 UNIT/GM EX OINT
TOPICAL_OINTMENT | CUTANEOUS | Status: AC
  Filled 2018-01-08: qty 1.8

## 2018-01-08 MED ORDER — LIDOCAINE HCL (PF) 1 % IJ SOLN
10.0000 mL | Freq: Once | INTRAMUSCULAR | Status: AC
  Administered 2018-01-08: 10 mL
  Filled 2018-01-08: qty 10

## 2018-01-08 MED ORDER — MORPHINE SULFATE (PF) 4 MG/ML IV SOLN
6.0000 mg | Freq: Once | INTRAVENOUS | Status: AC
  Administered 2018-01-08: 6 mg via INTRAVENOUS
  Filled 2018-01-08: qty 2

## 2018-01-08 MED ORDER — DIPHENHYDRAMINE HCL 50 MG/ML IJ SOLN
50.0000 mg | Freq: Once | INTRAMUSCULAR | Status: AC
Start: 2018-01-08 — End: 2018-01-08
  Administered 2018-01-08: 50 mg via INTRAVENOUS
  Filled 2018-01-08: qty 1

## 2018-01-08 MED ORDER — MORPHINE SULFATE (PF) 4 MG/ML IV SOLN
4.0000 mg | Freq: Once | INTRAVENOUS | Status: AC
  Administered 2018-01-08: 4 mg via INTRAVENOUS
  Filled 2018-01-08: qty 1

## 2018-01-08 MED ORDER — IOPAMIDOL (ISOVUE-300) INJECTION 61%
100.0000 mL | Freq: Once | INTRAVENOUS | Status: AC | PRN
  Administered 2018-01-08: 100 mL via INTRAVENOUS

## 2018-01-08 MED ORDER — POVIDONE-IODINE 10 % EX SOLN
CUTANEOUS | Status: DC | PRN
  Filled 2018-01-08 (×2): qty 15

## 2018-01-08 MED ORDER — OXYCODONE-ACETAMINOPHEN 5-325 MG PO TABS
1.0000 | ORAL_TABLET | Freq: Once | ORAL | Status: AC
Start: 2018-01-08 — End: 2018-01-08
  Administered 2018-01-08: 1 via ORAL
  Filled 2018-01-08: qty 1

## 2018-01-08 MED ORDER — SODIUM CHLORIDE 0.9 % IV BOLUS
2000.0000 mL | Freq: Once | INTRAVENOUS | Status: AC
  Administered 2018-01-08: 2000 mL via INTRAVENOUS

## 2018-01-08 MED ORDER — DIPHENHYDRAMINE HCL 50 MG/ML IJ SOLN: 50.0000 mg | Freq: Once | INTRAMUSCULAR | Status: DC

## 2018-01-08 MED ORDER — DIPHENHYDRAMINE HCL 25 MG PO CAPS: 50.0000 mg | ORAL_CAPSULE | Freq: Once | ORAL | Status: DC

## 2018-01-08 NOTE — ED Notes (Signed)
Pt transported to CT ?

## 2018-01-08 NOTE — ED Notes (Signed)
Breath sounds equal and clear bilaterally.

## 2018-01-08 NOTE — ED Notes (Signed)
Pt ambulatory around the room with no complaints.

## 2018-01-08 NOTE — ED Notes (Addendum)
Pt alert and oriented . Reports pressure washing house and ladder slipping. Pt fell approx 13 ft. Has redness and pain with palpation to chest. Laceration to left knee and abraised like area to left shin. Pt states he ate a protein bar and his blood sugar was 406 prior to climbing up ladder  Saline gauze placed on wounds

## 2018-01-08 NOTE — ED Notes (Signed)
Pt wheeled to waiting room. Pt verbalized understanding of discharge instructions.   

## 2018-01-08 NOTE — ED Triage Notes (Signed)
Pt reports being on a 44ft ladder pressure washing the house and the ladder fell down the side of the house until it was about 61ft off of the ground and then he free fell the remaining distance.  C/o chest pain on right side, right femur pain, lumbar pain, left knee, and facial pain.

## 2018-01-08 NOTE — ED Provider Notes (Signed)
[facility] EMERGENCY DEPARTMENT Provider Note   CSN: [id number] Arrival date & time: 01/08/18  1751     History   Chief Complaint Chief Complaint  Patient presents with  . Fall    HPI [name] is a 57 y.o. male. Patient fell from a ladder from a height of 13 feet. He states the ladder "slid to the ground and I slid down with it" he complains of chest pain, low back pain. Pain in her right thigh and pain at left knee with laceration at left knee as a result of the fall. He felt well prior to the event. No neck pain no loss of consciousness. No abdominal pain. No headache. Treated by EMS with hard cervical collar.  HPI  Past Medical History:  Diagnosis Date  . Anemia   . 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 Chronic gastritis   . Coronary artery disease    Patent Stent to Circ.  Patent coronary bypass grafts with a free radial graft to PDA and LIMA to the  diag.  . Diabetes mellitus   . Dyslipidemia    Poorly controlled, probably related to his DM (Some of this is secondary to his inability to afford medications).  . ED (erectile dysfunction)   . Esophageal yeast infection ([place])   . Metaplasia of esophagus   . Pancreatitis chronic    Pseudocyst  . Polycythemia vera(238.4) 04/26/2012    Patient Active Problem List   Diagnosis Date Noted  . Coronary artery disease of native artery of native heart with stable angina pectoris ([place]) 04/16/2017  . Polycythemia vera(238.4) 04/26/2012  . H/O chronic pancreatitis 01/31/2012  . GERD (gastroesophageal reflux disease) 01/31/2012  . Hypertension 11/08/2011  . DM 05/20/2009  . Dyslipidemia 05/20/2009  . CAD 05/20/2009  . CHEST PAIN, ACUTE 05/20/2009    Past Surgical History:  Procedure Laterality Date  . APPENDECTOMY    . CARDIAC CATHETERIZATION    . CHOLECYSTECTOMY    . COLONOSCOPY  07/31/1985  . COLONOSCOPY N/A 03/11/2016   Procedure: COLONOSCOPY;  Surgeon: Rogene Houston, MD;  Location: [facility] ENDO SUITE;  Service:  Endoscopy;  Laterality: N/A;  . CORONARY ANGIOPLASTY WITH STENT PLACEMENT  2006  . CORONARY ARTERY BYPASS GRAFT  11/24/2004  . ESOPHAGOGASTRODUODENOSCOPY  09/19/01  . ESOPHAGOGASTRODUODENOSCOPY N/A 03/11/2016   Procedure: ESOPHAGOGASTRODUODENOSCOPY (EGD);  Surgeon: Rogene Houston, MD;  Location: [facility] ENDO SUITE;  Service: Endoscopy;  Laterality: N/A;  12:00  . FINGER TENDON REPAIR     in middle finger  . KNEE SURGERY     left knee  . NASAL SEPTUM SURGERY          Home Medications    Prior to Admission medications   Medication Sig Start Date End Date Taking? Authorizing Provider  clopidogrel (PLAVIX) 75 MG tablet Take 1 tablet (75 mg total) by mouth daily. 03/12/16   Rehman, Mechele Dawley, MD  diazepam (VALIUM) 10 MG tablet Take 10 mg by mouth at bedtime as needed for anxiety or sleep. As Needed    [provider]  glipiZIDE (GLUCOTROL) 10 MG tablet Take 10 mg by mouth 2 (two) times daily.     [provider]  hydrochlorothiazide (HYDRODIURIL) 12.5 MG tablet Take 1 tablet by mouth daily. 02/18/17   [provider]  HYDROcodone-acetaminophen (NORCO) 10-325 MG tablet Take 1 tablet every 4 (four) hours as needed by mouth for severe pain.     [provider]  insulin NPH-regular Human (  NOVOLIN 70/30) (70-30) 100 UNIT/ML injection Inject 40 Units into the skin.    [provider]  metFORMIN (GLUCOPHAGE) 1000 MG tablet Take 1,000 mg by mouth 2 (two) times daily with a meal.     [provider]  metoprolol tartrate (LOPRESSOR) 25 MG tablet Take 25 mg by mouth 2 (two) times daily.    [provider]  naproxen (NAPROSYN) 500 MG tablet Take 500 mg by mouth 2 (two) times daily as needed for mild pain.  03/25/14   [provider]  nitroGLYCERIN (NITROSTAT) 0.4 MG SL tablet Place 0.4 mg under the tongue every 5 (five) minutes as needed for chest pain. Reported on 02/26/2016    [provider]  NON FORMULARY Domperidone 10 mg Patient  takes 2 by mouth daily    [provider]  omega-3 acid ethyl esters (LOVAZA) 1 g capsule TAKE TWO CAPSULES BY MOUTH TWICE DAILY 08/03/17   Minus Breeding, MD  ondansetron (ZOFRAN) 4 MG tablet Take 4 mg by mouth every 8 (eight) hours as needed for nausea or vomiting.    [provider]  OVER THE COUNTER MEDICATION Patient is taking Pancreatic Enzymes 4X -Amylase 50,000 , Lipase 4000,Protease 50 ,000 , Pancreatic Enzymes 4x 50 mg Patient takes 2 by mouth daily.    [provider]  pantoprazole (PROTONIX) 40 MG tablet TAKE 1 TABLET BY MOUTH DAILY. Patient taking differently: TAKE 1 TABLET BY MOUTH ONCE DAILY AS NEEDED FOR INDIGESTION. 12/27/12   Rehman, Mechele Dawley, MD  PARoxetine (PAXIL) 10 MG tablet Take 10 mg by mouth daily.      [provider]  promethazine (PHENERGAN) 25 MG tablet Take 25 mg by mouth every 8 (eight) hours as needed for nausea or vomiting.     [provider]  zolpidem (AMBIEN) 5 MG tablet Take 1 tablet by mouth as needed. 10/18/16   [provider]    Family History Family History  Problem Relation Age of Onset  . Coronary artery disease Mother        strong family history of  . Heart attack Mother        dying of (at age 36)  . Diabetes Mother   . Diabetes Sister   . Diabetes Brother   . Pancreatitis Brother   . Healthy Daughter   . Hyperlipidemia Daughter   . Heart disease Brother   . Hypertension Brother   . Hyperlipidemia Brother     Social History Social History   Tobacco Use  . Smoking status: Never Smoker  . Smokeless tobacco: Never Used  Substance Use Topics  . Alcohol use: Yes    Comment: Patient may drink a beer or glass of wine a couple times a month  . Drug use: No     Allergies   Metoclopramide hcl; Enalapril; Iodinated diagnostic agents; Iohexol; Sulfonamide derivatives; Penicillins; and Sulfa antibiotics   Review of Systems Review of Systems  Cardiovascular: Positive for chest pain.    Musculoskeletal: Positive for back pain and myalgias.       Low back pain since the fall  Skin: Positive for wound.       Laceration at left knee  Allergic/Immunologic: Positive for immunocompromised state.       Diabetic  All other systems reviewed and are negative.    Physical Exam Updated Vital Signs BP 121/86 (BP Location: Left Arm)   Pulse 78   Resp 16   Ht 5\' 9"  (1.753 m)   Wt 72.6  kg (160 lb)   SpO2 100%   BMI 23.63 kg/m   Physical Exam  Constitutional: He appears well-developed and well-nourished. He appears distressed.  Appears uncomfortable Glasgow Coma Score 15  HENT:  Head: Normocephalic and atraumatic.  Eyes: Pupils are equal, round, and reactive to light. Conjunctivae are normal.  Neck: Neck supple. No tracheal deviation present. No thyromegaly present.  Cardiovascular: Normal rate and regular rhythm.  No murmur heard. Pulmonary/Chest: Effort normal and breath sounds normal.  Chest diffusely tender anteriorly no crepitance or flail  Abdominal: Soft. Bowel sounds are normal. He exhibits no distension. There is no tenderness.  Genitourinary: Penis normal.  Musculoskeletal: Normal range of motion. He exhibits no edema or tenderness.  Entire spine is nontender. Pelvis stable nontender. Right lower extremity without deformity or swelling DP pulse 2+. Good capillary refill. Left lower extremity there is a 6 cm laceration at the anterior knee, no violation of joint space.there is also a 1 cmlaceration along the middle third of the shin No soft tissue swelling. DP pulse 2+. Good capillary refill.all other extremities no contusion abrasion or tenderness neurovascularly intact  Neurological: He is alert. No cranial nerve deficit. Coordination normal.  Glascow coma score 15, cranial nerves II through XII intact. Moves all extremity as well.  Skin: Skin is warm and dry. No rash noted.  Psychiatric: He has a normal mood and affect.  Nursing note and vitals reviewed.  8:20  PM pain improved after treatment with intravenous morphine.  CBC remarkable for hemoglobin 12.1 consistent with anemia, improved over 6 months ago. Complete metabolic panel remarkable for hyperglycemia and mild renal insufficiency. IV normal saline 2 L bolus ordered  Lacerations were repaired by 60 assistant Ms.Idol ED Treatments / Results  Labs (all labs ordered are listed, but only abnormal results are displayed) Labs Reviewed  COMPREHENSIVE METABOLIC PANEL  CBC WITH DIFFERENTIAL/PLATELET  URINALYSIS, ROUTINE W REFLEX MICROSCOPIC    EKG None  EKG Interpretation  Date/Time:  Sunday Jan 08 2018 18:38:31 EDT Ventricular Rate:  74 PR Interval:    QRS Duration: 107 QT Interval:  378 QTC Calculation: 420 R Axis:   82 Text Interpretation:  Sinus rhythm Low voltage, precordial leads RSR' in V1 or V2, right VCD or RVH SINCE LAST TRACING HEART RATE HAS INCREASED Confirmed by Orlie Dakin [phone]) on 01/08/2018 6:53:14 PM      Results for orders placed or performed during the hospital encounter of 01/08/18  Comprehensive metabolic panel  Result Value Ref Range   Sodium 136 135 - 145 mmol/L   Potassium 5.0 3.5 - 5.1 mmol/L   Chloride 103 101 - 111 mmol/L   CO2 20 (L) 22 - 32 mmol/L   Glucose, Bld 416 (H) 65 - 99 mg/dL   BUN 25 (H) 6 - 20 mg/dL   Creatinine, Ser 1.53 (H) 0.61 - 1.24 mg/dL   Calcium 9.4 8.9 - 10.3 mg/dL   Total Protein 6.7 6.5 - 8.1 g/dL   Albumin 3.8 3.5 - 5.0 g/dL   AST 35 15 - 41 U/L   ALT 20 17 - 63 U/L   Alkaline Phosphatase 47 38 - 126 U/L   Total Bilirubin 1.3 (H) 0.3 - 1.2 mg/dL   GFR calc non Af Amer 50 (L) >60 mL/min   GFR calc Af Amer 57 (L) >60 mL/min   Anion gap 13 5 - 15  CBC with Differential/Platelet  Result Value Ref Range   WBC 4.6 4.0 - 10.5 K/uL   RBC 5.62  4.22 - 5.81 MIL/uL   Hemoglobin 12.1 (L) 13.0 - 17.0 g/dL   HCT 40.2 39.0 - 52.0 %   MCV 71.5 (L) 78.0 - 100.0 fL   MCH 21.5 (L) 26.0 - 34.0 pg   MCHC 30.1 30.0 - 36.0 g/dL    RDW 19.0 (H) 11.5 - 15.5 %   Platelets 164 150 - 400 K/uL   Neutrophils Relative % 70 %   Neutro Abs 3.2 1.7 - 7.7 K/uL   Lymphocytes Relative 22 %   Lymphs Abs 1.0 0.7 - 4.0 K/uL   Monocytes Relative 6 %   Monocytes Absolute 0.3 0.1 - 1.0 K/uL   Eosinophils Relative 2 %   Eosinophils Absolute 0.1 0.0 - 0.7 K/uL   Basophils Relative 0 %   Basophils Absolute 0.0 0.0 - 0.1 K/uL  Urinalysis, Routine w reflex microscopic  Result Value Ref Range   Color, Urine YELLOW YELLOW   APPearance CLEAR CLEAR   Specific Gravity, Urine 1.022 1.005 - 1.030   pH 5.0 5.0 - 8.0   Glucose, UA >=500 (A) NEGATIVE mg/dL   Hgb urine dipstick NEGATIVE NEGATIVE   Bilirubin Urine NEGATIVE NEGATIVE   Ketones, ur 5 (A) NEGATIVE mg/dL   Protein, ur NEGATIVE NEGATIVE mg/dL   Nitrite NEGATIVE NEGATIVE   Leukocytes, UA NEGATIVE NEGATIVE   RBC / HPF 0-5 0 - 5 RBC/hpf   WBC, UA 0-5 0 - 5 WBC/hpf   Bacteria, UA NONE SEEN NONE SEEN   Mucus PRESENT   CBG monitoring, ED  Result Value Ref Range   Glucose-Capillary 307 (H) 65 - 99 mg/dL  CBG monitoring, ED  Result Value Ref Range   Glucose-Capillary 253 (H) 65 - 99 mg/dL   Ct Chest W Contrast  Result Date: 01/08/2018 CLINICAL DATA:  Fall from ladder.  Chest pain. EXAM: CT CHEST, ABDOMEN, AND PELVIS WITH CONTRAST TECHNIQUE: Multidetector CT imaging of the chest, abdomen and pelvis was performed following the standard protocol during bolus administration of intravenous contrast. CONTRAST:  147mL ISOVUE-300 IOPAMIDOL (ISOVUE-300) INJECTION 61% COMPARISON:  07/21/2017 FINDINGS: CT CHEST FINDINGS Cardiovascular: Prior CABG. Heart is normal size. Aorta is normal caliber. No evidence of aortic injury. Mediastinum/Nodes: No mediastinal, hilar, or axillary adenopathy. No mediastinal hematoma. Lungs/Pleura: Lungs are clear. No focal airspace opacities or suspicious nodules. No effusions. No pneumothorax Musculoskeletal: Mild bilateral gynecomastia. No acute bony abnormality. CT  ABDOMEN PELVIS FINDINGS Hepatobiliary: No hepatic injury or perihepatic hematoma. Prior cholecystectomy. Pancreas: No focal abnormality or ductal dilatation. Spleen: No splenic injury or perisplenic hematoma. Adrenals/Urinary Tract: No adrenal hemorrhage or renal injury identified. Bladder is unremarkable. Stomach/Bowel: Sigmoid diverticulosis. No active diverticulitis. Moderate stool burden throughout the colon. Stomach and small bowel unremarkable. Vascular/Lymphatic: Aortic atherosclerosis. No enlarged abdominal or pelvic lymph nodes. Reproductive: Prostate enlargement. Other: No free fluid or free air. Small bilateral inguinal hernias containing fat. Musculoskeletal: No acute bony abnormality. Degenerative changes in the lumbar spine. IMPRESSION: No evidence of solid organ injury or significant trauma in the chest, abdomen or pelvis. Prior CABG. No acute cardiopulmonary disease. Aortic atherosclerosis. Sigmoid diverticulosis. No acute findings in the chest, abdomen or pelvis. Electronically Signed   By: Rolm Baptise M.D.   On: 01/08/2018 19:09   Ct Abdomen Pelvis W Contrast  Result Date: 01/08/2018 CLINICAL DATA:  Fall from ladder.  Chest pain. EXAM: CT CHEST, ABDOMEN, AND PELVIS WITH CONTRAST TECHNIQUE: Multidetector CT imaging of the chest, abdomen and pelvis was performed following the standard protocol during bolus administration of intravenous contrast.  CONTRAST:  151mL ISOVUE-300 IOPAMIDOL (ISOVUE-300) INJECTION 61% COMPARISON:  07/21/2017 FINDINGS: CT CHEST FINDINGS Cardiovascular: Prior CABG. Heart is normal size. Aorta is normal caliber. No evidence of aortic injury. Mediastinum/Nodes: No mediastinal, hilar, or axillary adenopathy. No mediastinal hematoma. Lungs/Pleura: Lungs are clear. No focal airspace opacities or suspicious nodules. No effusions. No pneumothorax Musculoskeletal: Mild bilateral gynecomastia. No acute bony abnormality. CT ABDOMEN PELVIS FINDINGS Hepatobiliary: No hepatic injury or  perihepatic hematoma. Prior cholecystectomy. Pancreas: No focal abnormality or ductal dilatation. Spleen: No splenic injury or perisplenic hematoma. Adrenals/Urinary Tract: No adrenal hemorrhage or renal injury identified. Bladder is unremarkable. Stomach/Bowel: Sigmoid diverticulosis. No active diverticulitis. Moderate stool burden throughout the colon. Stomach and small bowel unremarkable. Vascular/Lymphatic: Aortic atherosclerosis. No enlarged abdominal or pelvic lymph nodes. Reproductive: Prostate enlargement. Other: No free fluid or free air. Small bilateral inguinal hernias containing fat. Musculoskeletal: No acute bony abnormality. Degenerative changes in the lumbar spine. IMPRESSION: No evidence of solid organ injury or significant trauma in the chest, abdomen or pelvis. Prior CABG. No acute cardiopulmonary disease. Aortic atherosclerosis. Sigmoid diverticulosis. No acute findings in the chest, abdomen or pelvis. Electronically Signed   By: Rolm Baptise M.D.   On: 01/08/2018 19:09   Dg Chest Portable 1 View  Result Date: 01/08/2018 CLINICAL DATA:  Fall EXAM: PORTABLE CHEST 1 VIEW COMPARISON:  03/10/2011 FINDINGS: Prior median sternotomy. Heart and mediastinal contours are within normal limits. No focal opacities or effusions. No acute bony abnormality. IMPRESSION: No active cardiopulmonary disease. Electronically Signed   By: Rolm Baptise M.D.   On: 01/08/2018 18:28   Dg Knee Complete 4 Views Left  Result Date: 01/08/2018 CLINICAL DATA:  Fall, right upper leg pain, left knee pain EXAM: LEFT KNEE - COMPLETE 4+ VIEW COMPARISON:  None. FINDINGS: Sclerotic area within the distal femur likely reflects old infarct. No acute bony abnormality. Specifically, no fracture, subluxation, or dislocation. No joint effusion. Joint spaces maintained. IMPRESSION: No acute bony abnormality. Electronically Signed   By: Rolm Baptise M.D.   On: 01/08/2018 19:30   Dg Femur Min 2 Views Right  Result Date:  01/08/2018 CLINICAL DATA:  Fall with leg pain EXAM: RIGHT FEMUR 2 VIEWS COMPARISON:  None. FINDINGS: Contrast within the urinary bladder. Mild degenerative changes of the right hip. No acute fracture or malalignment. Soft tissues are unremarkable. IMPRESSION: No acute osseous abnormality. Electronically Signed   By: Donavan Foil M.D.   On: 01/08/2018 19:31   Radiology No results found.  Procedures Procedures (including critical care time)  Medications Ordered in ED Medications  diphenhydrAMINE (BENADRYL) injection 50 mg (50 mg Intravenous Given 01/08/18 1819)  morphine 4 MG/ML injection 6 mg (6 mg Intravenous Given 01/08/18 1820)     Initial Impression / Assessment and Plan / ED Course  I have reviewed the triage vital signs and the nursing notes.  Pertinent labs & imaging results that were available during my care of the patient were reviewed by me and considered in my medical decision making (see chart for details).     Of note patient reports his allergy to IV contrast is "feeling hot" which is not an allergy. He last had IV contrast in 2002. Radiology records indicate that reaction was feeling hot and shortness of breath. I discussed case with Dr.Hu radiologist. Feel that IV contrast is indicated and checking for visceral injury or internal bleeding outweighs risk of allergy. He'll be pretreated with Benadryl prior to CT a knee immobilizer was placed on patient's leftknee after laceration repaired  10:50 PM he is alert ambulatory pain is well-controlled after treatment with intravenous opioids and Percocet. He's not lightheaded on standing. Blood sugar has come down significantly after treatment with intravenous normal saline. He feels ready to go home  Cervical spine cleared by Nexus criteria Plan sutures out in one week. Tylenol for mild pain. Hereports that he has has adequate hydrocodone-acetaminophen at home. No prescription needed.  [place] Controlled Substance reporting  System queried labwork consistent with mild renal insufficiency and hyperglycemia Final Clinical Impressions(s) / ED Diagnoses  Diagnosis #1 fall #2 contusions multiple sites #3 lacerations to left lower extremity Final diagnoses:  None  #4 hyperglycemia  ED Discharge Orders    None       Orlie Dakin, MD 01/08/18 2301

## 2018-01-08 NOTE — Discharge Instructions (Addendum)
Take Tylenol for mild pain or your hydrocodone-acetaminophen for bad pain. Don't take Tylenol together with hydrocodone-acetaminophen as accommodation be dangerous. Liver. Use the knee immobilizer to prevent stitches from tearing over your left knee. Wash the wound sdaily with soap and water and place a thin layer of bacitracin ointment over the wound and cover with a sterile bandage.signs of infection include redness around the wounds, swelling, drainage from the wounds or fever. If you think he might be developing an infection, see your doctor, or you can return to the emergency department if concern for any reason. Stitches to come out in one week

## 2018-01-08 NOTE — ED Provider Notes (Signed)
I was asked to suture the wounds on pt's left knee and lower anterior shin.  LACERATION REPAIR Performed by: Evalee Jefferson Authorized by: Evalee Jefferson Consent: Verbal consent obtained. Risks and benefits: risks, benefits and alternatives were discussed Consent given by: patient Patient identity confirmed: provided demographic data Prepped and Draped in normal sterile fashion Wound explored  Laceration Location: left knee  Laceration Length: 6cm  No Foreign Bodies seen or palpated  Anesthesia: local infiltration  Local anesthetic: lidocaine 1% without epinephrine  Anesthetic total: 6 ml  Irrigation method: syringe Amount of cleaning: standard  Skin closure: horizontal mattress using ethilon 4-0  Number of sutures: 4  Technique: horizontal mattress  Patient tolerance: Patient tolerated the procedure well with no immediate complications.  LACERATION REPAIR Performed by: Evalee Jefferson Authorized by: Evalee Jefferson Consent: Verbal consent obtained. Risks and benefits: risks, benefits and alternatives were discussed Consent given by: patient Patient identity confirmed: provided demographic data Prepped and Draped in normal sterile fashion Wound explored  Laceration Location: left anterior shin  Laceration Length: 1cm  No Foreign Bodies seen or palpated  Anesthesia: local infiltration  Local anesthetic: lidocaine 1% without epinephrine  Anesthetic total: 2 ml  Irrigation method: syringe Amount of cleaning: standard  Skin closure: ethilon 4-0  Number of sutures: 2  Technique: simple interupted  Patient tolerance: Patient tolerated the procedure well with no immediate complications.     Evalee Jefferson, PA-C 01/08/18 2005    Orlie Dakin, MD 01/08/18 [phone]

## 2018-02-14 DIAGNOSIS — D751 Secondary polycythemia: Secondary | ICD-10-CM | POA: Insufficient documentation

## 2018-04-25 ENCOUNTER — Institutional Professional Consult (permissible substitution): Payer: Self-pay | Admitting: Neurology

## 2018-06-05 ENCOUNTER — Ambulatory Visit: Payer: Medicare Other | Admitting: Neurology

## 2018-06-05 ENCOUNTER — Encounter

## 2018-06-05 ENCOUNTER — Encounter: Payer: Self-pay | Admitting: Neurology

## 2018-06-05 VITALS — BP 156/96 | HR 68 | Ht 69.5 in | Wt 157.0 lb

## 2018-06-05 DIAGNOSIS — Z8669 Personal history of other diseases of the nervous system and sense organs: Secondary | ICD-10-CM | POA: Diagnosis not present

## 2018-06-05 DIAGNOSIS — Z951 Presence of aortocoronary bypass graft: Secondary | ICD-10-CM | POA: Diagnosis not present

## 2018-06-05 DIAGNOSIS — D45 Polycythemia vera: Secondary | ICD-10-CM | POA: Diagnosis not present

## 2018-06-05 DIAGNOSIS — R258 Other abnormal involuntary movements: Secondary | ICD-10-CM

## 2018-06-05 DIAGNOSIS — K861 Other chronic pancreatitis: Secondary | ICD-10-CM | POA: Diagnosis not present

## 2018-06-05 DIAGNOSIS — Z79891 Long term (current) use of opiate analgesic: Secondary | ICD-10-CM

## 2018-06-05 DIAGNOSIS — R351 Nocturia: Secondary | ICD-10-CM

## 2018-06-05 NOTE — Patient Instructions (Signed)

## 2018-06-05 NOTE — Progress Notes (Signed)
156/Subjective:    Patient ID: [name] is a 57 y.o. male.  HPI     Star Age, MD, PhD [facility] [address], [address] [place], [place] [address]  Dear Dr. Federico Flake,   I saw your patient, [name], upon your kind request in my sleep clinic today for initial consultation of his sleep disorder, in particular, concern for underlying obstructive sleep apnea in the context of polycythemia or erythrocytosis. The patient is unaccompanied today. As you know, [name] is a 57 year old right-handed gentleman with an underlying medical history of hyperlipidemia, coronary artery disease with status post CABG, anxiety, type 2 diabetes, hypertension, history of chronic pancreatitis, reflux disease, and polycythemia vera, who reports a prior diagnosis of obstructive sleep apnea but he no longer has a CPAP machine. He recalls that he woke up about 20 times per hour. I reviewed your office note from 02/14/2018, which you kindly included. He reports a prior diagnosis of sleep apnea and had a CPAP machine several years ago. Prior sleep study results are not available for my review today. A CPAP download was not available today. He has a history of snoring. 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 His Epworth sleepiness score is 5 out of 24, fatigue score is 56 out of 63. He is married and lives with his wife and his daughter. He is a nonsmoker and does not utilize alcohol, drinks caffeine about 16 ounces per day on average in the form of coffee. He also drinks some green tea, and some tea. He has a variable sleep schedule. He has nocturia about 5 times per night, occasional AM HAs. There is a FHx of OSA in his brother. He often sleeps in his recliner. He has discomfort in his feet at night. He has aches and pains in different areas. He takes hydrocodone typically 1-1/2 pills daily. Sometimes he has trouble going to sleep and takes Ambien as needed, also  Valium as needed.  His Past Medical History Is Significant For: Past Medical History:  Diagnosis Date  . Anemia   . Bypass graft stenosis ([facility])    '06  . Chronic gastritis   . Coronary artery disease    Patent Stent to Circ.  Patent coronary bypass grafts with a free radial graft to PDA and LIMA to the  diag.  . Diabetes mellitus   . Dyslipidemia    Poorly controlled, probably related to his DM (Some of this is secondary to his inability to afford medications).  . ED (erectile dysfunction)   . Esophageal yeast infection ([facility])   . Metaplasia of esophagus   . Pancreatitis chronic    Pseudocyst  . Polycythemia vera(238.4) 04/26/2012    His Past Surgical History Is Significant For: Past Surgical History:  Procedure Laterality Date  . APPENDECTOMY    . CARDIAC CATHETERIZATION    . CHOLECYSTECTOMY    . COLONOSCOPY  07/31/1985  . COLONOSCOPY N/A 03/11/2016   Procedure: COLONOSCOPY;  Surgeon: Rogene Houston, MD;  Location: [facility];  Service: Endoscopy;  Laterality: N/A;  . CORONARY ANGIOPLASTY WITH STENT PLACEMENT  2006  . CORONARY ARTERY BYPASS GRAFT  11/24/2004  . ESOPHAGOGASTRODUODENOSCOPY  09/19/01  . ESOPHAGOGASTRODUODENOSCOPY N/A 03/11/2016   Procedure: ESOPHAGOGASTRODUODENOSCOPY (EGD);  Surgeon: Rogene Houston, MD;  Location: [facility];  Service: Endoscopy;  Laterality: N/A;  12:00  . FINGER TENDON REPAIR     in middle finger  . KNEE SURGERY  left knee  . NASAL SEPTUM SURGERY      His Family History Is Significant For: Family History  Problem Relation Age of Onset  . Coronary artery disease Mother        strong family history of  . Heart attack Mother        dying of (at age 74)  . Diabetes Mother   . Diabetes Sister   . Diabetes Brother   . Pancreatitis Brother   . Healthy Daughter   . Hyperlipidemia Daughter   . Heart disease Brother   . Hypertension Brother   . Hyperlipidemia Brother     His Social History Is Significant For: Social History    Socioeconomic History  . Marital status: Married    Spouse name: Not on file  . Number of children: Not on file  . Years of education: Not on file  . Highest education level: Not on file  Occupational History  . Not on file  Social Needs  . Financial resource strain: Not on file  . Food insecurity:    Worry: Not on file    Inability: Not on file  . Transportation needs:    Medical: Not on file    Non-medical: Not on file  Tobacco Use  . Smoking status: Never Smoker  . Smokeless tobacco: Never Used  Substance and Sexual Activity  . Alcohol use: Yes    Comment: Patient may drink a beer or glass of wine a couple times a month  . Drug use: No  . Sexual activity: Not on file  Lifestyle  . Physical activity:    Days per week: Not on file    Minutes per session: Not on file  . Stress: Not on file  Relationships  . Social connections:    Talks on phone: Not on file    Gets together: Not on file    Attends religious service: Not on file    Active member of club or organization: Not on file    Attends meetings of clubs or organizations: Not on file    Relationship status: Not on file  Other Topics Concern  . Not on file  Social History Narrative   The patient lives in [place] with his wife.  He is     disabled secondary to coronary artery disease and pancreatitis.  He does     not smoke, he does not drink alcohol.  There is no drug use.     His Allergies Are:  Allergies  Allergen Reactions  . Metoclopramide Hcl Hives    Patient became very spastic  . Enalapril Cough  . Iodinated Diagnostic Agents     Rash, nausea and flushing  . Iohexol      Desc: HIVES,SOB NEEDS PREP.MEDS   . Sulfonamide Derivatives     Large hives  . Penicillins Rash    Has patient had a PCN reaction causing immediate rash, facial/tongue/throat swelling, SOB or lightheadedness with hypotension: Yes Has patient had a PCN reaction causing severe rash involving mucus membranes or skin necrosis:  No Has patient had a PCN reaction that required hospitalization No Has patient had a PCN reaction occurring within the last 10 years: No. If all of the above answers are "NO", then may proceed with Cephalosporin use.   . Sulfa Antibiotics Rash    Other Reaction: Not Assessed  :   His Current Medications Are:  Outpatient Encounter Medications as of 06/05/2018  Medication Sig  . Cholecalciferol (VITAMIN D-1000 MAX  ST) 1000 units tablet Take 4,000 Units by mouth daily.  . clopidogrel (PLAVIX) 75 MG tablet Take 1 tablet (75 mg total) by mouth daily.  . diazepam (VALIUM) 10 MG tablet Take 10 mg by mouth at bedtime as needed for anxiety or sleep. As Needed  . diphenhydrAMINE (BENADRYL) 25 MG tablet Take 1 tablet by mouth as needed. As needed for itching  . ferrous sulfate 325 (65 FE) MG tablet Take 325 mg by mouth daily with breakfast.  . glipiZIDE (GLUCOTROL) 10 MG tablet Take 10 mg by mouth 2 (two) times daily.   . hydrochlorothiazide (HYDRODIURIL) 12.5 MG tablet Take 1 tablet by mouth daily.  Marland Kitchen HYDROcodone-acetaminophen (NORCO) 10-325 MG tablet Take 1 tablet every 4 (four) hours as needed by mouth for severe pain.   Marland Kitchen insulin NPH-regular Human (NOVOLIN 70/30) (70-30) 100 UNIT/ML injection Inject 40 Units into the skin.  Marland Kitchen losartan-hydrochlorothiazide (HYZAAR) 100-12.5 MG tablet Take 1 tablet by mouth daily.  . metFORMIN (GLUCOPHAGE) 1000 MG tablet Take 1,000 mg by mouth 2 (two) times daily with a meal.   . metoprolol tartrate (LOPRESSOR) 100 MG tablet Take 100 mg by mouth 2 (two) times daily.  . midazolam (VERSED) 2 MG/2ML SOLN injection   . naproxen (NAPROSYN) 500 MG tablet Take 500 mg by mouth 2 (two) times daily as needed for mild pain.   . nitroGLYCERIN (NITROSTAT) 0.4 MG SL tablet Place 0.4 mg under the tongue every 5 (five) minutes as needed for chest pain. Reported on 02/26/2016  . NON FORMULARY Domperidone 10 mg Patient takes 2 by mouth daily  . omega-3 acid ethyl esters (LOVAZA) 1 g  capsule TAKE TWO CAPSULES BY MOUTH TWICE DAILY  . ondansetron (ZOFRAN) 4 MG tablet Take 4 mg by mouth every 8 (eight) hours as needed for nausea or vomiting.  . Pancrelipase, Lip-Prot-Amyl, 6000 units CPEP Take by mouth as needed.   . pantoprazole (PROTONIX) 40 MG tablet TAKE 1 TABLET BY MOUTH DAILY. (Patient taking differently: TAKE 1 TABLET BY MOUTH ONCE DAILY AS NEEDED FOR INDIGESTION.)  . PARoxetine (PAXIL) 10 MG tablet Take 10 mg by mouth daily.    . promethazine (PHENERGAN) 25 MG tablet Take 25 mg by mouth every 8 (eight) hours as needed for nausea or vomiting.   Marland Kitchen Propofol 100 MG/10ML EMUL Inject into the vein.  Marland Kitchen tetrahydrozoline 0.05 % ophthalmic solution Apply to eye.  . [DISCONTINUED] lansoprazole (PREVACID) 30 MG capsule Take 30 mg by mouth daily at 12 noon.  . [DISCONTINUED] zolpidem (AMBIEN) 5 MG tablet Take 1 tablet by mouth as needed.   No facility-administered encounter medications on file as of 06/05/2018.   :  Review of Systems:  Out of a complete 14 point review of systems, all are reviewed and negative with the exception of these symptoms as listed below: Review of Systems  Neurological:       Pt presents today to discuss his sleep. Pt has had a sleep study over 10 years ago and was on a cpap but the pressure kept getting lowered. Pt wants to discuss his neuropathy.  Epworth Sleepiness Scale 0= would never doze 1= slight chance of dozing 2= moderate chance of dozing 3= high chance of dozing  Sitting and reading: 1 Watching TV: 0 Sitting inactive in a public place (ex. Theater or meeting): 0 As a passenger in a car for an hour without a break: 0 Lying down to rest in the afternoon: 1 Sitting and talking to someone: 2 Sitting quietly  after lunch (no alcohol): 1 In a car, while stopped in traffic: 0 Total: 5     Objective:  Neurological Exam  Physical Exam Physical Examination:   Vitals:   06/05/18 1325  BP: (!) 156/96  Pulse: 68    General  Examination: The patient is a very pleasant 57 y.o. male in no acute distress. He appears well-developed and well-nourished and well groomed.   HEENT: Normocephalic, atraumatic, pupils are equal, round and reactive to light and accommodation. Corrective eye glasses in place. Extraocular tracking is good without limitation to gaze excursion or nystagmus noted. Normal smooth pursuit is noted. Hearing is grossly intact. Face is symmetric with normal facial animation and normal facial sensation. Speech is clear with no dysarthria noted. There is no hypophonia. There is no lip, neck/head, jaw or voice tremor. Neck is supple with full range of passive and active motion. There are no carotid bruits on auscultation. Oropharynx exam reveals: mild mouth dryness, adequate dental hygiene and moderate airway crowding, due to wide. Mallampati is class II. Tongue protrudes centrally and palate elevates symmetrically. Tonsils are 1+. Neck size is 15 3/8 inches. He has a minimal overbite.    Chest: Clear to auscultation without wheezing, rhonchi or crackles noted.  Heart: S1+S2+0, regular and normal without murmurs, rubs or gallops noted.   Abdomen: Soft, non-tender and non-distended with normal bowel sounds appreciated on auscultation.  Extremities: There is no pitting edema in the distal lower extremities bilaterally.  Skin: Warm and dry without trophic changes noted.  Musculoskeletal: exam reveals no obvious joint deformities, tenderness or joint swelling or erythema.   Neurologically:  Mental status: The patient is awake, alert and oriented in all 4 spheres. His immediate and remote memory, attention, language skills and fund of knowledge are appropriate. There is no evidence of aphasia, agnosia, apraxia or anomia. Speech is clear with normal prosody and enunciation. Thought process is linear. Mood is normal and affect is normal.  Cranial nerves II - XII are as described above under HEENT exam. In addition:  shoulder shrug is normal with equal shoulder height noted. Motor exam: Normal bulk, strength and tone is noted. There is no tremor. Fine motor skills and coordination: grossly intact.  Cerebellar testing: No dysmetria or intention tremor. There is no truncal or gait ataxia.  Sensory exam: intact to light touch in the upper and lower extremities.  Gait, station and balance: He stands easily. No veering to one side is noted. No leaning to one side is noted. Posture is age-appropriate and stance is narrow based. Gait shows normal stride length and normal pace. No problems turning are noted.              Assessment and Plan:    In summary, [name] is a very pleasant 57 y.o.-year old male with an underlying medical history of hyperlipidemia, coronary artery disease with status post CABG, anxiety, type 2 diabetes, hypertension, history of chronic pancreatitis, reflux disease, and polycythemia vera, whose history and physical exam are concerning for obstructive sleep apnea (OSA).  I had a long chat with the patient about my findings and the diagnosis of OSA, its prognosis and treatment options. We talked about medical treatments, surgical interventions and non-pharmacological approaches. I explained in particular the risks and ramifications of untreated moderate to severe OSA, especially with respect to developing cardiovascular disease down the Road, including congestive heart failure, difficult to treat hypertension, cardiac arrhythmias, or stroke. Even type 2 diabetes has, in part, been linked  to untreated OSA. Symptoms of untreated OSA include daytime sleepiness, memory problems, mood irritability and mood disorder such as depression and anxiety, lack of energy, as well as recurrent headaches, especially morning headaches. We talked about trying to maintain a healthy lifestyle in general, as well as the importance of weight control. I encouraged the patient to eat healthy, exercise daily and keep well  hydrated, to keep a scheduled bedtime and wake time routine, to not skip any meals and eat healthy snacks in between meals. I advised the patient not to drive when feeling sleepy. I recommended the following at this time: sleep study with potential positive airway pressure titration. (We will score hypopneas at 4%).   I explained the sleep test procedure to the patient and also outlined possible surgical and non-surgical treatment options of OSA, including the use of a custom-made dental device (which would require a referral to a specialist dentist or oral surgeon), upper airway surgical options, such as pillar implants, radiofrequency surgery, tongue base surgery, and UPPP (which would involve a referral to an ENT surgeon). Rarely, jaw surgery such as mandibular advancement may be considered.  I also explained the CPAP treatment option to the patient, who indicated that he would be willing to try CPAP if the need arises. I explained the importance of being compliant with PAP treatment, not only for insurance purposes but primarily to improve His symptoms, and for the patient's long term health benefit, including to reduce His cardiovascular risks. I answered all his questions today and the patient was in agreement. I plan to see him back after the sleep study is completed and encouraged him to call with any interim questions, concerns, problems or updates.   Thank you very much for allowing me to participate in the care of this nice patient. If I can be of any further assistance to you please do not hesitate to call me at [phone].  Sincerely,   Star Age, MD, PhD

## 2018-06-15 ENCOUNTER — Telehealth: Payer: Self-pay

## 2018-06-15 NOTE — Telephone Encounter (Signed)
Pt called me at 4:45p today yelling that he rec'd his sleep study paperwork and that I wrote the wrong date. Pt was scheduled on Thurs Oct 31st.  My paperwork, schedule, and his paperwork has the 31st, and I distinctly remember talking to him about coming on halloween.  Pt kept yelling at me over and over that I made a mistake, that I wrote it down wrong and scheduled him wrong multiple times.  I tried explaining to pt that Oct 30th wasn't available and had been booked up for weeks.  He didn't want to hear that. He continues to yell that this was my mistake. I apologized for the misunderstanding and asked pt can he keep his scheduled appt or not. He yelled that he did not want the 31st appt.  He also stated he didn't want an appt in November either.  I offered the cancellation list and he stated he can't be called the day of and expect to come in that evening. Pt then puts wife on phone, she begins accusing me of making the mistake.  Pt is in the back ground yelling and cussing.  Pt then yells that other offices are more efficient and do a better job than I clearly can. When I tried to talk to pt again and in the middle of apologizing for the third time, pt hangs the phone up. Pt's appt was cancelled due to pt refusing to reschedule and stating he could not come on scheduled date of 10/31.

## 2018-06-20 ENCOUNTER — Ambulatory Visit (INDEPENDENT_AMBULATORY_CARE_PROVIDER_SITE_OTHER): Payer: Medicare Other | Admitting: Neurology

## 2018-06-20 DIAGNOSIS — G4720 Circadian rhythm sleep disorder, unspecified type: Secondary | ICD-10-CM

## 2018-06-20 DIAGNOSIS — D45 Polycythemia vera: Secondary | ICD-10-CM

## 2018-06-20 DIAGNOSIS — K861 Other chronic pancreatitis: Secondary | ICD-10-CM

## 2018-06-20 DIAGNOSIS — Z79891 Long term (current) use of opiate analgesic: Secondary | ICD-10-CM

## 2018-06-20 DIAGNOSIS — Z951 Presence of aortocoronary bypass graft: Secondary | ICD-10-CM

## 2018-06-20 DIAGNOSIS — R258 Other abnormal involuntary movements: Secondary | ICD-10-CM

## 2018-06-20 DIAGNOSIS — R351 Nocturia: Secondary | ICD-10-CM

## 2018-06-20 DIAGNOSIS — R0683 Snoring: Secondary | ICD-10-CM

## 2018-06-20 DIAGNOSIS — Z8669 Personal history of other diseases of the nervous system and sense organs: Secondary | ICD-10-CM

## 2018-06-20 DIAGNOSIS — G4733 Obstructive sleep apnea (adult) (pediatric): Secondary | ICD-10-CM

## 2018-06-26 NOTE — Procedures (Signed)
PATIENT'S NAME:  [name], [name] DOB:      1961/07/01      MR#:    [id number]     DATE OF RECORDING: 06/20/2018 REFERRING M.D.:  Wanita Chamberlain, MD Study Performed:   Baseline Polysomnogram HISTORY: 57 year old man with a history of hyperlipidemia, coronary artery disease (s/p CABG), anxiety, diabetes, hypertension, chronic pancreatitis, reflux disease, and polycythemia vera, who reports a prior diagnosis of obstructive sleep apnea but he no longer has a CPAP machine. The patient endorsed the Epworth Sleepiness Scale at 5 points. The patient's weight 157 pounds with a height of 69 (inches), resulting in a BMI of 23.2 kg/m2. The patient's neck circumference measured 15.5 inches.  CURRENT MEDICATIONS: Plavix, Valium, Benadryl, Glipizide, Hydrodiuril, Norco, Novolin, Hyzaar, Metformin, Lopressor, Naprosyn, Nitrostat, Lovaza, Zofran, Protonix, Paxil, Phenergan   PROCEDURE:  This is a multichannel digital polysomnogram utilizing the Somnostar 11.2 system.  Electrodes and sensors were applied and monitored per AASM Specifications.   EEG, EOG, Chin and Limb EMG, were sampled at 200 Hz.  ECG, Snore and Nasal Pressure, Thermal Airflow, Respiratory Effort, CPAP Flow and Pressure, Oximetry was sampled at 50 Hz. Digital video and audio were recorded.      BASELINE STUDY  Lights Out was at 23:01 and Lights On at 05:01.  Total recording time (TRT) was 360.5 minutes, with a total sleep time (TST) of 161 minutes.   The patient's sleep latency to persistent sleep was 196 minutes, which is markedly delayed. REM latency was 287 minutes, which is markedly delayed. The sleep efficiency was 44.7%.     SLEEP ARCHITECTURE: WASO (Wake after sleep onset) was 172.5 minutes. There were 24.5 minutes in Stage N1, 42.5 minutes Stage N2, 79 minutes Stage N3 and 15 minutes in Stage REM. The percentage of Stage N1 was 15.2%, which is increased, Stage N2 was 26.4%, Stage N3 was 49.1%, which is increased, and Stage R (REM sleep) was  9.3%, which is reduced. The arousals were noted as: 12 were spontaneous, 0 were associated with PLMs, 0 were associated with respiratory events.  Of note, the EEG amplitude was generally lower throughout the study.  RESPIRATORY ANALYSIS:  There were a total of 0 respiratory events:  0 obstructive apneas, 0 central apneas and 0 mixed apneas with a total of 0 apneas and an apnea index (AI) of 0 /hour. There were 0 hypopneas with a hypopnea index of 0 /hour. The patient also had 0 respiratory event related arousals (RERAs).      The total APNEA/HYPOPNEA INDEX (AHI) was 0 /hour and the total RESPIRATORY DISTURBANCE INDEX was 0 /hour.  0 events occurred in REM sleep and 0 events in NREM. The REM AHI was 0 /hour, versus a non-REM AHI of 0. The patient spent 0 minutes of total sleep time in the supine position and 161 minutes in non-supine.. The supine AHI was 0.0 versus a non-supine AHI of 0.0.  OXYGEN SATURATION & C02:  The Wake baseline 02 saturation was 97%, with the lowest being 83% (error, due to loss of sensor, true nadir of 88%). Time spent below 89% saturation equaled 0 minutes. PERIODIC LIMB MOVEMENTS: The patient had a total of 0 Periodic Limb Movements.  The Periodic Limb Movement (PLM) index was 0 and the PLM Arousal index was 0/hour.  Audio and video analysis did not show any abnormal or unusual movements, behaviors, phonations or vocalizations. The patient took 3 bathroom breaks. Moderate to loud snoring was noted. The EKG was in keeping with normal sinus  rhythm (NSR).   Post-study, the patient indicated that sleep was the same as usual.   IMPRESSION:  1. Primary Snoring 2. Dysfunctions associated with sleep stages or arousal from sleep  RECOMMENDATIONS:  1. This study does not demonstrate any significant obstructive or central sleep disordered breathing, except for snoring. For disturbing snoring, an oral appliance (through a qualified dentist) can be considered. CPAP or autoPAP is not  indicated, based on this study. However, the poor sleep efficiency and absence of supine sleep may underestimate his sleep disordered breathing.  2. This study shows sleep fragmentation and abnormal sleep stage percentages; these are nonspecific findings and per se do not signify an intrinsic sleep disorder or a cause for the patient's sleep-related symptoms. Causes include (but are not limited to) the first night effect of the sleep study, circadian rhythm disturbances, medication effect or an underlying mood disorder or medical problem.  3. The patient should be cautioned not to drive, work at heights, or operate dangerous or heavy equipment when tired or sleepy. Review and reiteration of good sleep hygiene measures should be pursued with any patient. 4. The patient will be advised to follow up with the referring provider, who will be notified of the test results.  I certify that I have reviewed the entire raw data recording prior to the issuance of this report in accordance with the Standards of Accreditation of the American Academy of Sleep Medicine (AASM)   Star Age, MD, PhD Diplomat, American Board of Neurology and Sleep Medicine (Neurology and Sleep Medicine)

## 2018-06-26 NOTE — Progress Notes (Signed)
Patient referred by his hematologist, seen by me on 06/05/18, diagnostic PSG on 06/20/18.   Please call and notify the patient that the recent sleep study did not show any significant obstructive sleep apnea, except for snoring. For disturbing snoring, an oral appliance (through a qualified dentist) can be considered. CPAP or autoPAP is not indicated, based on this study. However, the poor sleep efficiency and absence of supine sleep may underestimate his sleep disordered breathing.  He can FU with PCP and referring provider, as scheduled.  Please remind patient to try to maintain good sleep hygiene, which means: Keep a regular sleep and wake schedule and make enough time for sleep (7 1/2 to 8 1/2 hours for the average adult), try not to exercise or have a meal within 2 hours of your bedtime, try to keep your bedroom conducive for sleep, that is, cool and dark, without light distractors such as an illuminated alarm clock, and refrain from watching TV right before sleep or in the middle of the night and do not keep the TV or radio on during the night. If a nightlight is used, have it away from the visual field. Also, try not to use or play on electronic devices at bedtime, such as your cell phone, tablet PC or laptop. If you like to read at bedtime on an electronic device, try to dim the background light as much as possible. Do not eat in the middle of the night. Keep pets away from the bedroom environment. For stress relief, try meditation, deep breathing exercises (there are many books and CDs available), a white noise machine or fan can help to diffuse other noise distractors, such as traffic noise. Do not drink alcohol before bedtime, as it can disturb sleep and cause middle of the night awakenings. Never mix alcohol and sedating medications! Avoid narcotic pain medication close to bedtime, as opioids/narcotics can suppress breathing drive and breathing effort.    Star Age, MD, PhD [facility])

## 2018-06-27 ENCOUNTER — Telehealth: Payer: Self-pay

## 2018-06-27 NOTE — Telephone Encounter (Signed)
I called pt. I advised pt that Dr. Rexene Alberts reviewed pt's sleep study and found that pt did not show any significant osa during his sleep study. Pt had some snoring noted during the sleep study and if this snoring is disturbing to the pt, Dr. Rexene Alberts recommends that pt consider an oral appliance to treat snoring. The oral appliance should be made by a qualified dentist. Pt had poor sleep efficiency and did not sleep on his back which may underestimate his sleep disordered breathing.  Dr. Rexene Alberts recommends that pt follow up with his PCP and referring provider. I reviewed sleep hygiene recommendations with the pt, including trying to keep a regular sleep wake schedule, avoiding electronics in the bedroom, keeping the bedroom cool, dark, and quiet, and avoiding eating or exercising within 2 hours of bedtime as well as eating in the middle of the night. I advised pt to keep pets out of the bedroom. I discussed with pt the importance of stress relief and to try meditation, deep breathing exercises, and/or a white noise machine or fan to diffuse other noise distractors. I advised pt to not drink alcohol before bedtime and to never mix alcohol and sedating medications. Pt was advised to avoid narcotic pain medication close to bedtime. I advised pt that a copy of these sleep study results will be sent to Dr. Sandrea Hammond. Pt verbalized understanding of results. Pt had no questions at this time but was encouraged to call back if questions arise.

## 2018-06-27 NOTE — Telephone Encounter (Signed)
-----   Message from Star Age, MD sent at 06/26/2018  8:38 AM EST ----- Patient referred by his hematologist, seen by me on 06/05/18, diagnostic PSG on 06/20/18.   Please call and notify the patient that the recent sleep study did not show any significant obstructive sleep apnea, except for snoring. For disturbing snoring, an oral appliance (through a qualified dentist) can be considered. CPAP or autoPAP is not indicated, based on this study. However, the poor sleep efficiency and absence of supine sleep may underestimate his sleep disordered breathing.  He can FU with PCP and referring provider, as scheduled.  Please remind patient to try to maintain good sleep hygiene, which means: Keep a regular sleep and wake schedule and make enough time for sleep (7 1/2 to 8 1/2 hours for the average adult), try not to exercise or have a meal within 2 hours of your bedtime, try to keep your bedroom conducive for sleep, that is, cool and dark, without light distractors such as an illuminated alarm clock, and refrain from watching TV right before sleep or in the middle of the night and do not keep the TV or radio on during the night. If a nightlight is used, have it away from the visual field. Also, try not to use or play on electronic devices at bedtime, such as your cell phone, tablet PC or laptop. If you like to read at bedtime on an electronic device, try to dim the background light as much as possible. Do not eat in the middle of the night. Keep pets away from the bedroom environment. For stress relief, try meditation, deep breathing exercises (there are many books and CDs available), a white noise machine or fan can help to diffuse other noise distractors, such as traffic noise. Do not drink alcohol before bedtime, as it can disturb sleep and cause middle of the night awakenings. Never mix alcohol and sedating medications! Avoid narcotic pain medication close to bedtime, as opioids/narcotics can suppress breathing  drive and breathing effort.    Star Age, MD, PhD [facility])

## 2018-06-28 NOTE — Progress Notes (Signed)
HPI The patient presents for followup of his known coronary disease.  In 2016 he had chest pain but stress perfusion study demonstrated an EF of 60% with no ischemia.  Since I last saw him he is done well from a cardiovascular standpoint.  Neuropathy has bothered him so is not been at the gym until very recently.  He just started doing the gym and he had no problems with this.  He has had chest discomfort but this seems he is stable pattern about once per month when he needs a nitroglycerin.  He has not required any increased amount of this.  He is not having any shortness of breath, PND or orthopnea.  Is not have any palpitations, presyncope or syncope.  He is lost a little weight.   Allergies  Allergen Reactions  . Metoclopramide Hcl Hives    Patient became very spastic  . Anesthesia S-I-60 Other (See Comments)    Pancreatic attack.  . Enalapril Cough  . Iodinated Diagnostic Agents     Rash, nausea and flushing  . Iohexol      Desc: HIVES,SOB NEEDS PREP.MEDS   . Sulfonamide Derivatives     Large hives  . Penicillins Rash    Has patient had a PCN reaction causing immediate rash, facial/tongue/throat swelling, SOB or lightheadedness with hypotension: Yes Has patient had a PCN reaction causing severe rash involving mucus membranes or skin necrosis: No Has patient had a PCN reaction that required hospitalization No Has patient had a PCN reaction occurring within the last 10 years: No. If all of the above answers are "NO", then may proceed with Cephalosporin use.   . Sulfa Antibiotics Rash    Other Reaction: Not Assessed    Current Outpatient Medications  Medication Sig Dispense Refill  . Cholecalciferol (VITAMIN D-1000 MAX ST) 1000 units tablet Take 4,000 Units by mouth daily.    . clopidogrel (PLAVIX) 75 MG tablet Take 1 tablet (75 mg total) by mouth daily.    . diazepam (VALIUM) 10 MG tablet Take 10 mg by mouth at bedtime as needed for anxiety or sleep. As Needed    .  diphenhydrAMINE (BENADRYL) 25 MG tablet Take 1 tablet by mouth as needed. As needed for itching    . ferrous sulfate 325 (65 FE) MG tablet Take 325 mg by mouth daily with breakfast.    . glipiZIDE (GLUCOTROL) 10 MG tablet Take 10 mg by mouth 2 (two) times daily.     . hydrochlorothiazide (HYDRODIURIL) 12.5 MG tablet Take 1 tablet by mouth daily.    Marland Kitchen HYDROcodone-acetaminophen (NORCO) 10-325 MG tablet Take 1 tablet every 4 (four) hours as needed by mouth for severe pain.     Marland Kitchen insulin NPH-regular Human (NOVOLIN 70/30) (70-30) 100 UNIT/ML injection Inject 40 Units into the skin.    Marland Kitchen losartan-hydrochlorothiazide (HYZAAR) 100-12.5 MG tablet Take 1 tablet by mouth daily.    . metFORMIN (GLUCOPHAGE) 1000 MG tablet Take 1,000 mg by mouth 2 (two) times daily with a meal.     . metoprolol tartrate (LOPRESSOR) 100 MG tablet Take 100 mg by mouth 2 (two) times daily.    . midazolam (VERSED) 2 MG/2ML SOLN injection     . naproxen (NAPROSYN) 500 MG tablet Take 500 mg by mouth 2 (two) times daily as needed for mild pain.     . nitroGLYCERIN (NITROSTAT) 0.4 MG SL tablet Place 0.4 mg under the tongue every 5 (five) minutes as needed for chest pain. Reported on  02/26/2016    . NON FORMULARY Domperidone 10 mg Patient takes 2 by mouth daily    . omega-3 acid ethyl esters (LOVAZA) 1 g capsule Take 2 capsules (2 g total) by mouth 2 (two) times daily. 360 capsule 3  . ondansetron (ZOFRAN) 4 MG tablet Take 4 mg by mouth every 8 (eight) hours as needed for nausea or vomiting.    . Pancrelipase, Lip-Prot-Amyl, 6000 units CPEP Take by mouth as needed.     . pantoprazole (PROTONIX) 40 MG tablet TAKE 1 TABLET BY MOUTH DAILY. (Patient taking differently: TAKE 1 TABLET BY MOUTH ONCE DAILY AS NEEDED FOR INDIGESTION.) 30 tablet 1  . PARoxetine (PAXIL) 10 MG tablet Take 10 mg by mouth daily.      . promethazine (PHENERGAN) 25 MG tablet Take 25 mg by mouth every 8 (eight) hours as needed for nausea or vomiting.     Marland Kitchen Propofol 100  MG/10ML EMUL Inject into the vein.    Marland Kitchen tetrahydrozoline 0.05 % ophthalmic solution Apply to eye.     No current facility-administered medications for this visit.     Past Medical History:  Diagnosis Date  . Anemia   . 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 Chronic gastritis   . Coronary artery disease    Patent Stent to Circ.  Patent coronary bypass grafts with a free radial graft to PDA and LIMA to the  diag.  . Diabetes mellitus   . Dyslipidemia    Poorly controlled, probably related to his DM (Some of this is secondary to his inability to afford medications).  . ED (erectile dysfunction)   . Esophageal yeast infection ([place])   . Metaplasia of esophagus   . Pancreatitis chronic    Pseudocyst  . Polycythemia vera(238.4) 04/26/2012    Past Surgical History:  Procedure Laterality Date  . APPENDECTOMY    . CARDIAC CATHETERIZATION    . CHOLECYSTECTOMY    . COLONOSCOPY  07/31/1985  . COLONOSCOPY N/A 03/11/2016   Procedure: COLONOSCOPY;  Surgeon: Rogene Houston, MD;  Location: [facility] ENDO SUITE;  Service: Endoscopy;  Laterality: N/A;  . CORONARY ANGIOPLASTY WITH STENT PLACEMENT  2006  . CORONARY ARTERY BYPASS GRAFT  11/24/2004  . ESOPHAGOGASTRODUODENOSCOPY  09/19/01  . ESOPHAGOGASTRODUODENOSCOPY N/A 03/11/2016   Procedure: ESOPHAGOGASTRODUODENOSCOPY (EGD);  Surgeon: Rogene Houston, MD;  Location: [facility] ENDO SUITE;  Service: Endoscopy;  Laterality: N/A;  12:00  . FINGER TENDON REPAIR     in middle finger  . KNEE SURGERY     left knee  . NASAL SEPTUM SURGERY      ROS  Neuropathy.  Otherwise as stated in the HPI and negative for all other systems.    PHYSICAL EXAM BP 130/70 (BP Location: Right Arm, Patient Position: Sitting, Cuff Size: Normal)   Pulse 66   Ht 5' 9.5" (1.765 m)   Wt 162 lb (73.5 kg)   BMI 23.58 kg/m   GENERAL:  Well appearing NECK:  No jugular venous distention, waveform within normal limits, carotid upstroke brisk and symmetric, no bruits, no thyromegaly LUNGS:   Clear to auscultation bilaterally CHEST:  Well healed sternotomy scar. HEART:  PMI not displaced or sustained,S1 and S2 within normal limits, no S3, no S4, no clicks, no rubs, no murmurs ABD:  Flat, positive bowel sounds normal in frequency in pitch, no bruits, no rebound, no guarding, no midline pulsatile mass, no hepatomegaly, no splenomegaly EXT:  2 plus pulses throughout, no edema, no cyanosis no  clubbing   EKG:  NSR ,rate 74, axis WNL, intervals WNL, no acute ST T wave changes.  RSR' suggestive of RV conduction delay.   01/08/18   Lab Results  Component Value Date   CHOL 262 (H) 08/19/2016   TRIG 433 (H) 08/19/2016   HDL 38 (L) 08/19/2016   LDLCALC NOT CALC 08/19/2016   LDLDIRECT 110 08/19/2016    ASSESSMENT AND PLAN  CAD:   The patient has no new sypmtoms.  No further cardiovascular testing is indicated.  We will continue with aggressive risk reduction and meds as listed.  He has a stable angina pattern.  HTN:   The blood pressure is at target. No change in medications is indicated. We will continue with therapeutic lifestyle changes (TLC).  HYPERLIPIDEMIA:   He was intolerant of Repatha and statins.  No change in therapy.  I do not have his most recent lipids but he says they were the best they have been because he lost weight.  DM: His last A1C was 8.9.   He is having this followed by his primary provider.

## 2018-06-29 ENCOUNTER — Ambulatory Visit: Payer: Medicare Other | Admitting: Cardiology

## 2018-06-29 ENCOUNTER — Encounter: Payer: Self-pay | Admitting: Cardiology

## 2018-06-29 VITALS — BP 130/70 | HR 66 | Ht 69.5 in | Wt 162.0 lb

## 2018-06-29 DIAGNOSIS — E118 Type 2 diabetes mellitus with unspecified complications: Secondary | ICD-10-CM

## 2018-06-29 DIAGNOSIS — I10 Essential (primary) hypertension: Secondary | ICD-10-CM

## 2018-06-29 DIAGNOSIS — E785 Hyperlipidemia, unspecified: Secondary | ICD-10-CM

## 2018-06-29 DIAGNOSIS — I25118 Atherosclerotic heart disease of native coronary artery with other forms of angina pectoris: Secondary | ICD-10-CM | POA: Diagnosis not present

## 2018-06-29 DIAGNOSIS — Z794 Long term (current) use of insulin: Secondary | ICD-10-CM

## 2018-06-29 MED ORDER — OMEGA-3-ACID ETHYL ESTERS 1 G PO CAPS: 2.0000 | ORAL_CAPSULE | Freq: Two times a day (BID) | ORAL | 3 refills | Status: DC

## 2018-06-29 NOTE — Patient Instructions (Signed)

## 2018-08-22 ENCOUNTER — Other Ambulatory Visit (HOSPITAL_COMMUNITY): Payer: Self-pay | Admitting: Oncology

## 2018-08-22 DIAGNOSIS — I10 Essential (primary) hypertension: Secondary | ICD-10-CM

## 2018-08-25 ENCOUNTER — Ambulatory Visit (HOSPITAL_COMMUNITY)
Admission: RE | Admit: 2018-08-25 | Discharge: 2018-08-25 | Disposition: A | Discharge status: Home / Self Care | Payer: Medicare Other | Source: Ambulatory Visit | Attending: Family Medicine | Admitting: Family Medicine

## 2018-08-25 ENCOUNTER — Telehealth: Payer: Self-pay | Admitting: Cardiology

## 2018-08-25 DIAGNOSIS — I10 Essential (primary) hypertension: Secondary | ICD-10-CM | POA: Insufficient documentation

## 2018-08-25 NOTE — Telephone Encounter (Signed)
New Message   Pt is calling, states that he was suppose to have an echo in [place] but its been a lot of confusion and miscommunication so he would like to have the echo done at the [address] office. Please call

## 2018-08-25 NOTE — Telephone Encounter (Signed)
Left message to call back,

## 2018-08-25 NOTE — Progress Notes (Signed)
*  PRELIMINARY RESULTS* Echocardiogram 2D Echocardiogram has been performed.  [name] 08/25/2018, 12:28 PM

## 2018-08-25 NOTE — Telephone Encounter (Signed)
Spoke with pt who was very upset stating he has been trying to get in touch with someone from the ECHO department to rescheduled for later today. Pt states he has been on the phone with several people and no one could get him to the right department. Pt states he is needing to have it done at [facility] instead of [facility]. Informed pt that I would not be able to put in the order without approval from Dr. Percival Spanish, as the original order was wrote by Dr. Sandrea Hammond. Pt advised to contact their office to revise order. Pt become upset screaming and states he will try to contact the ECHO department again.

## 2018-08-27 ENCOUNTER — Encounter (HOSPITAL_COMMUNITY): Payer: Self-pay | Admitting: Emergency Medicine

## 2018-08-27 ENCOUNTER — Emergency Department (HOSPITAL_COMMUNITY): Payer: Medicare Other

## 2018-08-27 ENCOUNTER — Emergency Department (HOSPITAL_COMMUNITY)
Admission: EM | Admit: 2018-08-27 | Discharge: 2018-08-27 | Disposition: A | Discharge status: Home / Self Care | Payer: Medicare Other | Attending: Emergency Medicine | Admitting: Emergency Medicine

## 2018-08-27 ENCOUNTER — Other Ambulatory Visit: Payer: Self-pay

## 2018-08-27 DIAGNOSIS — R1084 Generalized abdominal pain: Secondary | ICD-10-CM | POA: Insufficient documentation

## 2018-08-27 DIAGNOSIS — E119 Type 2 diabetes mellitus without complications: Secondary | ICD-10-CM | POA: Diagnosis not present

## 2018-08-27 DIAGNOSIS — I10 Essential (primary) hypertension: Secondary | ICD-10-CM | POA: Insufficient documentation

## 2018-08-27 DIAGNOSIS — R109 Unspecified abdominal pain: Secondary | ICD-10-CM

## 2018-08-27 DIAGNOSIS — I2510 Atherosclerotic heart disease of native coronary artery without angina pectoris: Secondary | ICD-10-CM | POA: Insufficient documentation

## 2018-08-27 DIAGNOSIS — K5903 Drug induced constipation: Secondary | ICD-10-CM | POA: Insufficient documentation

## 2018-08-27 DIAGNOSIS — Z79899 Other long term (current) drug therapy: Secondary | ICD-10-CM | POA: Diagnosis not present

## 2018-08-27 DIAGNOSIS — Z794 Long term (current) use of insulin: Secondary | ICD-10-CM | POA: Insufficient documentation

## 2018-08-27 DIAGNOSIS — M545 Low back pain: Secondary | ICD-10-CM | POA: Diagnosis present

## 2018-08-27 LAB — URINALYSIS, ROUTINE W REFLEX MICROSCOPIC
BILIRUBIN URINE: NEGATIVE
Bacteria, UA: NONE SEEN
HGB URINE DIPSTICK: NEGATIVE
KETONES UR: NEGATIVE mg/dL
Leukocytes, UA: NEGATIVE
NITRITE: NEGATIVE
PH: 5 (ref 5.0–8.0)
PROTEIN: NEGATIVE mg/dL
Specific Gravity, Urine: 1.002 — ABNORMAL LOW (ref 1.005–1.030)

## 2018-08-27 LAB — I-STAT CHEM 8, ED
BUN: 28 mg/dL — ABNORMAL HIGH (ref 6–20)
CREATININE: 1 mg/dL (ref 0.61–1.24)
Calcium, Ion: 1.17 mmol/L (ref 1.15–1.40)
Chloride: 100 mmol/L (ref 98–111)
Glucose, Bld: 303 mg/dL — ABNORMAL HIGH (ref 70–99)
HCT: 51 % (ref 39.0–52.0)
HEMOGLOBIN: 17.3 g/dL — AB (ref 13.0–17.0)
Potassium: 3.6 mmol/L (ref 3.5–5.1)
Sodium: 133 mmol/L — ABNORMAL LOW (ref 135–145)
TCO2: 27 mmol/L (ref 22–32)

## 2018-08-27 LAB — CBG MONITORING, ED: GLUCOSE-CAPILLARY: 290 mg/dL — AB (ref 70–99)

## 2018-08-27 MED ORDER — KETOROLAC TROMETHAMINE 30 MG/ML IJ SOLN
30.0000 mg | Freq: Once | INTRAMUSCULAR | Status: AC
  Administered 2018-08-27: 30 mg via INTRAVENOUS
  Filled 2018-08-27: qty 1

## 2018-08-27 MED ORDER — HYDROCODONE-ACETAMINOPHEN 5-325 MG PO TABS
2.0000 | ORAL_TABLET | Freq: Once | ORAL | Status: AC
Start: 2018-08-27 — End: 2018-08-27
  Administered 2018-08-27: 2 via ORAL
  Filled 2018-08-27: qty 2

## 2018-08-27 MED ORDER — METHOCARBAMOL 500 MG PO TABS: ORAL_TABLET | ORAL | 0 refills | Status: DC

## 2018-08-27 MED ORDER — DIAZEPAM 5 MG PO TABS
5.0000 mg | ORAL_TABLET | Freq: Once | ORAL | Status: AC
  Administered 2018-08-27: 5 mg via ORAL
  Filled 2018-08-27: qty 1

## 2018-08-27 NOTE — Discharge Instructions (Addendum)
Get miralax and put one dose or 17 g in 8 ounces of water,  take 1 dose every 30 minutes for 2-3 hours or until you  get good results and then once or twice daily to prevent constipation.   You already have pain medication you can take at home.  You can take it with the muscle relaxer, Robaxin.    Use ice and heat for comfort on your flank area.  The radiologist did recommend you follow-up with your doctor to get a nonemergent abdominal MRI to look at your pancreas again.

## 2018-08-27 NOTE — ED Provider Notes (Signed)
[facility] EMERGENCY DEPARTMENT Provider Note   CSN: [id number] Arrival date & time: 08/27/18  0300  Time seen 3:30 AM   History   Chief Complaint Chief Complaint  Patient presents with  . Back Pain    HPI [name] is a 58 y.o. male.  HPI patient states he has been having some lower back pain off and on for the past 3 weeks.  He states he had an injury when he was 58 years old and when he was in his 53s he was told he had a disc inflamed and if he continued to have problems he might need surgery.  He states his pain has been constant and described as sharp since the morning of January 4.  He states changing positions makes it hurts more, nothing makes it feel better.  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  Patient states he called his doctor's office today however they could not see him till Monday.  They told him to take his chronic pain medications he has for his pancreatitis.  He states he does not like to take it because it causes constipation.  At 1230 this morning he drove himself to [facility] to get an enema.  He states he had not had a bowel movement in a couple of days.  He states he then drove himself to the ED.  He states he has chronic pain in his left testicle he thought maybe he was having a prostate inflammation about a week ago and he has been on levofloxacin for that.  He denies nausea, vomiting, hematuria, or fever.  He states he has been "Hungary headed" today.  He states he took 3 Aleve at one time earlier today.  He states now the pain seems to be more on his left side of his back rather than in the back itself.  He states it is been there for 3 weeks.  PCP Lemmie Evens, MD   Past Medical History:  Diagnosis Date  . Anemia   . 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 Chronic gastritis   . Coronary artery disease    Patent Stent to Circ.  Patent coronary bypass grafts with  a free radial graft to PDA and LIMA to the  diag.  . Diabetes mellitus   . Dyslipidemia    Poorly controlled, probably related to his DM (Some of this is secondary to his inability to afford medications).  . ED (erectile dysfunction)   . Esophageal yeast infection ([place])   . Metaplasia of esophagus   . Pancreatitis chronic    Pseudocyst  . Polycythemia vera(238.4) 04/26/2012    Patient Active Problem List   Diagnosis Date Noted  . Coronary artery disease of native artery of native heart with stable angina pectoris ([place]) 04/16/2017  . Polycythemia vera(238.4) 04/26/2012  . H/O chronic pancreatitis 01/31/2012  . GERD (gastroesophageal reflux disease) 01/31/2012  . Hypertension 11/08/2011  . DM 05/20/2009  . Dyslipidemia 05/20/2009  . CAD 05/20/2009  . CHEST PAIN, ACUTE 05/20/2009    Past Surgical History:  Procedure Laterality Date  . APPENDECTOMY    . CARDIAC CATHETERIZATION    . CHOLECYSTECTOMY    . COLONOSCOPY  07/31/1985  . COLONOSCOPY N/A 03/11/2016   Procedure: COLONOSCOPY;  Surgeon: Rogene Houston, MD;  Location: [facility];  Service: Endoscopy;  Laterality: N/A;  . CORONARY ANGIOPLASTY WITH STENT  PLACEMENT  2006  . CORONARY ARTERY BYPASS GRAFT  11/24/2004  . ESOPHAGOGASTRODUODENOSCOPY  09/19/01  . ESOPHAGOGASTRODUODENOSCOPY N/A 03/11/2016   Procedure: ESOPHAGOGASTRODUODENOSCOPY (EGD);  Surgeon: Rogene Houston, MD;  Location: [facility];  Service: Endoscopy;  Laterality: N/A;  12:00  . FINGER TENDON REPAIR     in middle finger  . KNEE SURGERY     left knee  . NASAL SEPTUM SURGERY          Home Medications    Prior to Admission medications   Medication Sig Start Date End Date Taking? Authorizing Provider  Cholecalciferol (VITAMIN D-1000 MAX ST) 1000 units tablet Take 4,000 Units by mouth daily.    [provider]  clopidogrel (PLAVIX) 75 MG tablet Take 1 tablet (75 mg total) by mouth daily. 03/12/16   Rehman, Mechele Dawley, MD  diazepam (VALIUM) 10 MG  tablet Take 10 mg by mouth at bedtime as needed for anxiety or sleep. As Needed    [provider]  diphenhydrAMINE (BENADRYL) 25 MG tablet Take 1 tablet by mouth as needed. As needed for itching    [provider]  ferrous sulfate 325 (65 FE) MG tablet Take 325 mg by mouth daily with breakfast.    [provider]  glipiZIDE (GLUCOTROL) 10 MG tablet Take 10 mg by mouth 2 (two) times daily.     [provider]  hydrochlorothiazide (HYDRODIURIL) 12.5 MG tablet Take 1 tablet by mouth daily. 02/18/17   [provider]  HYDROcodone-acetaminophen (NORCO) 10-325 MG tablet Take 1 tablet every 4 (four) hours as needed by mouth for severe pain.     [provider]  insulin NPH-regular Human (NOVOLIN 70/30) (70-30) 100 UNIT/ML injection Inject 40 Units into the skin.    [provider]  losartan-hydrochlorothiazide (HYZAAR) 100-12.5 MG tablet Take 1 tablet by mouth daily.    [provider]  metFORMIN (GLUCOPHAGE) 1000 MG tablet Take 1,000 mg by mouth 2 (two) times daily with a meal.     [provider]  methocarbamol (ROBAXIN) 500 MG tablet Take 1 or 2 po Q 6hrs for muscle pain 08/27/18   Rolland Porter, MD  metoprolol tartrate (LOPRESSOR) 100 MG tablet Take 100 mg by mouth 2 (two) times daily.    [provider]  midazolam (VERSED) 2 MG/2ML SOLN injection  06/17/11   [provider]  naproxen (NAPROSYN) 500 MG tablet Take 500 mg by mouth 2 (two) times daily as needed for mild pain.  03/25/14   [provider]  nitroGLYCERIN (NITROSTAT) 0.4 MG SL tablet Place 0.4 mg under the tongue every 5 (five) minutes as needed for chest pain. Reported on 02/26/2016    [provider]  NON FORMULARY Domperidone 10 mg Patient takes 2 by mouth daily    [provider]  omega-3 acid ethyl esters (LOVAZA) 1 g capsule Take 2 capsules (2 g total) by mouth 2 (two) times daily. 06/29/18   Minus Breeding, MD    ondansetron (ZOFRAN) 4 MG tablet Take 4 mg by mouth every 8 (eight) hours as needed for nausea or vomiting.    [provider]  Pancrelipase, Lip-Prot-Amyl, 6000 units CPEP Take by mouth as needed.     [provider]  pantoprazole (PROTONIX) 40 MG tablet TAKE 1 TABLET BY MOUTH DAILY. Patient taking differently: TAKE 1 TABLET BY MOUTH ONCE DAILY AS NEEDED FOR INDIGESTION. 12/27/12   Rehman, Mechele Dawley, MD  PARoxetine (PAXIL) 10 MG tablet Take 10 mg by  mouth daily.      [provider]  promethazine (PHENERGAN) 25 MG tablet Take 25 mg by mouth every 8 (eight) hours as needed for nausea or vomiting.     [provider]  Propofol 100 MG/10ML EMUL Inject into the vein. 06/17/11   [provider]  tetrahydrozoline 0.05 % ophthalmic solution Apply to eye.    [provider]    Family History Family History  Problem Relation Age of Onset  . Coronary artery disease Mother        strong family history of  . Heart attack Mother        dying of (at age 85)  . Diabetes Mother   . Diabetes Sister   . Diabetes Brother   . Pancreatitis Brother   . Healthy Daughter   . Hyperlipidemia Daughter   . Heart disease Brother   . Hypertension Brother   . Hyperlipidemia Brother     Social History Social History   Tobacco Use  . Smoking status: Never Smoker  . Smokeless tobacco: Never Used  Substance Use Topics  . Alcohol use: Yes    Comment: Patient may drink a beer or glass of wine a couple times a month  . Drug use: No  Lives with spouse   Allergies   Metoclopramide hcl; Anesthesia s-i-60; Enalapril; Iodinated diagnostic agents; Iohexol; Sulfonamide derivatives; Penicillins; and Sulfa antibiotics   Review of Systems Review of Systems  All other systems reviewed and are negative.    Physical Exam Updated Vital Signs BP (!) 182/95 (BP Location: Left Arm)   Pulse 62   Temp 98.7 F (37.1 C) (Oral)   Resp 16   Ht 5' 9.5" (1.765 m)    Wt 74.4 kg   SpO2 98%   BMI 23.87 kg/m   Physical Exam Vitals signs and nursing note reviewed.  Constitutional:      General: He is not in acute distress.    Appearance: Normal appearance. He is well-developed. He is not ill-appearing or toxic-appearing.  HENT:     Head: Normocephalic and atraumatic.     Right Ear: External ear normal.     Left Ear: External ear normal.     Nose: Nose normal. No mucosal edema or rhinorrhea.     Mouth/Throat:     Mouth: Mucous membranes are moist.     Dentition: No dental abscesses.     Pharynx: No uvula swelling.  Eyes:     Conjunctiva/sclera: Conjunctivae normal.     Pupils: Pupils are equal, round, and reactive to light.  Neck:     Musculoskeletal: Full passive range of motion without pain, normal range of motion and neck supple.  Cardiovascular:     Rate and Rhythm: Normal rate and regular rhythm.     Heart sounds: Normal heart sounds. No murmur. No friction rub. No gallop.   Pulmonary:     Effort: Pulmonary effort is normal. No respiratory distress.     Breath sounds: Normal breath sounds. No wheezing, rhonchi or rales.  Chest:     Chest wall: No tenderness or crepitus.  Abdominal:     General: Bowel sounds are normal. There is no distension.     Palpations: Abdomen is soft.     Tenderness: There is no abdominal tenderness. There is no guarding or rebound.  Genitourinary:    Comments: Patient's right testicle was normal sized and nontender, his left testicle feels slightly smaller than the right and he states it is tender  however it is soft without firmness or masses.  He is nontender over the epididymis.  His scrotum is normal. Musculoskeletal: Normal range of motion.        General: No tenderness.     Comments: Moves all extremities well.  Patient seems painful when he changes positions.  However when I palpate his thoracic and lumbar spine he is not tender there.  He is tender in the left flank area diffusely.  He is not tender over the  SI joints on either side.  His patellar reflexes are 2+ and equal bilaterally.  Skin:    General: Skin is warm and dry.     Coloration: Skin is not pale.     Findings: No erythema or rash.  Neurological:     General: No focal deficit present.     Mental Status: He is alert and oriented to person, place, and time.     Cranial Nerves: No cranial nerve deficit.  Psychiatric:        Mood and Affect: Mood normal. Mood is not anxious.        Speech: Speech normal.        Behavior: Behavior normal.        Thought Content: Thought content normal.      ED Treatments / Results  Labs (all labs ordered are listed, but only abnormal results are displayed)  Results for orders placed or performed during the hospital encounter of 08/27/18  Urinalysis, Routine w reflex microscopic  Result Value Ref Range   Color, Urine STRAW (A) YELLOW   APPearance CLEAR CLEAR   Specific Gravity, Urine 1.002 (L) 1.005 - 1.030   pH 5.0 5.0 - 8.0   Glucose, UA >=500 (A) NEGATIVE mg/dL   Hgb urine dipstick NEGATIVE NEGATIVE   Bilirubin Urine NEGATIVE NEGATIVE   Ketones, ur NEGATIVE NEGATIVE mg/dL   Protein, ur NEGATIVE NEGATIVE mg/dL   Nitrite NEGATIVE NEGATIVE   Leukocytes, UA NEGATIVE NEGATIVE   RBC / HPF 0-5 0 - 5 RBC/hpf   Bacteria, UA NONE SEEN NONE SEEN  CBG monitoring, ED  Result Value Ref Range   Glucose-Capillary 290 (H) 70 - 99 mg/dL  I-stat Chem 8, ED  Result Value Ref Range   Sodium 133 (L) 135 - 145 mmol/L   Potassium 3.6 3.5 - 5.1 mmol/L   Chloride 100 98 - 111 mmol/L   BUN 28 (H) 6 - 20 mg/dL   Creatinine, Ser 1.00 0.61 - 1.24 mg/dL   Glucose, Bld 303 (H) 70 - 99 mg/dL   Calcium, Ion 1.17 1.15 - 1.40 mmol/L   TCO2 27 22 - 32 mmol/L   Hemoglobin 17.3 (H) 13.0 - 17.0 g/dL   HCT 51.0 39.0 - 52.0 %   Laboratory interpretation all normal except hyperglycemia, glucosuria   EKG None  Radiology Ct Renal [name] Study  Result Date: 08/27/2018 CLINICAL DATA:  Initial evaluation for  intermittent left-sided flank pain for 3 weeks. EXAM: CT ABDOMEN AND PELVIS WITHOUT CONTRAST TECHNIQUE: Multidetector CT imaging of the abdomen and pelvis was performed following the standard protocol without IV contrast. COMPARISON:  Prior CT from 01/08/2018. FINDINGS: Lower chest: Visualized lung bases are clear. Small calcified lymph node noted associated with a fat containing hiatal hernia. Hepatobiliary: Limited noncontrast evaluation of the liver is unremarkable. Gallbladder surgically absent. No biliary dilatation. Partially calcified porta hepatis node measuring 13 mm noted, similar to previous. Pancreas: Irregular somewhat bilobed and partially calcified lesion at the pancreatic tail again seen, grossly similar  to prior CT measuring up to approximately 2.8 cm, not well delineated on this noncontrast examination. Pancreatic duct mildly dilated up to 6 mm at the level of the mid pancreatic body. Remainder of the pancreas is normal in appearance. Spleen: Spleen is normal. Adrenals/Urinary Tract: Adrenal glands within normal limits. Kidneys equal in size without hydronephrosis. There are a few small punctate nonobstructive right renal nephrolithiasis involving the mid and upper pole. No radiopaque calculi seen along the course of either renal collecting system. No hydroureter. Partially distended bladder within normal limits. No layering stones within the bladder lumen. Stomach/Bowel: Hiatal hernia containing mostly fat noted. Stomach within normal limits. No evidence for bowel obstruction. Colonic diverticulosis without overt evidence for acute diverticulitis. No acute inflammatory changes seen about the bowels. Vascular/Lymphatic: Intra-abdominal aorta of normal caliber. Mild aorto bi-iliac atherosclerotic disease. No other adenopathy within the abdomen and pelvis. Reproductive: Prostate mildly enlarged measuring 5.4 cm in diameter. Other: Small bilateral fat containing inguinal hernias noted. No free air or  fluid. Musculoskeletal: No acute osseous abnormality. No discrete lytic or blastic osseous lesions. 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 IMPRESSION: 1. Punctate nonobstructive right renal nephrolithiasis. No left-sided renal calculi or evidence for obstructive uropathy. 2. Colonic diverticulosis without overt evidence for acute diverticulitis. 3. Grossly similar appearance of irregular partially calcified lesion at the pancreatic tail. Finding is indeterminate, and could reflect changes related to previous/chronic pancreatitis or possibly a pancreatic neoplasm. Again, follow-up examination with nonemergent abdominal MRI recommended for complete evaluation. 4. 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 Electronically Signed   By: Jeannine Boga M.D.   On: 08/27/2018 04:58    Procedures Procedures (including critical care time)  Medications Ordered in ED Medications  ketorolac (TORADOL) 30 MG/ML injection 30 mg (30 mg Intravenous Given 08/27/18 0358)  diazepam (VALIUM) tablet 5 mg (5 mg Oral Given 08/27/18 0358)  HYDROcodone-acetaminophen (NORCO/VICODIN) 5-325 MG per tablet 2 tablet (2 tablets Oral Given 08/27/18 1610)     Initial Impression / Assessment and Plan / ED Course  I have reviewed the triage vital signs and the nursing notes.  Pertinent labs & imaging results that were available during my care of the patient were reviewed by me and considered in my medical decision making (see chart for details).     Patient was given Toradol IV for possible kidney [name] and oral Valium.  I had reviewed his blood work from May 19 and his BUN was 25 and creatinine was 1.5, a year ago however it was normal at 19 and 1.27.  I-STAT 8 was done to clarify that.  CT renal was ordered.  Patient had had a CT abdominal/pelvis with contrast on Jan 08, 2018 that did not show any acute findings.  5:45 AM we discussed his test results.   When we look at his CT scan he is noted to have diffuse stool throughout his colon.  I told him at this point I could not give him anything stronger for pain which would also make his constipation worse.  I would send him home with a muscle relaxer and his usual dose of pain medication and he can continue with the medication he already has.  I will give him some advice on his constipation. We discussed if his diabetes was under better control I would consider giving him steroids however he has a blood sugar in the low 300s and he has glucose in his urine.  Review of  the [employer] shows he has a overdose risk score of 100.  He received # 120 hydrocodone 10/325 on December 30 and on August 14 and on May 21 and March 27 of this year.  He also got #90 Valium 10 mg tablets on August 24, 2017.  Final Clinical Impressions(s) / ED Diagnoses   Final diagnoses:  Drug-induced constipation  Left flank pain    ED Discharge Orders         Ordered    methocarbamol (ROBAXIN) 500 MG tablet     08/27/18 0600         Plan discharge  Rolland Porter, MD, Barbette Or, MD 08/27/18 [phone]

## 2018-08-27 NOTE — ED Triage Notes (Signed)
Pt C/O back pain that began 3 weeks ago. Pt states he saw Dr. Karie Kirks yesterday and "they didn't do anything." Pt states he takes hydrocodone 10mg  at home for pain. Pt last took his pain medication around midnight.

## 2018-09-13 ENCOUNTER — Ambulatory Visit (INDEPENDENT_AMBULATORY_CARE_PROVIDER_SITE_OTHER): Payer: Medicare Other | Admitting: Internal Medicine

## 2018-09-14 ENCOUNTER — Encounter (INDEPENDENT_AMBULATORY_CARE_PROVIDER_SITE_OTHER): Payer: Self-pay | Admitting: *Deleted

## 2018-09-14 ENCOUNTER — Ambulatory Visit (INDEPENDENT_AMBULATORY_CARE_PROVIDER_SITE_OTHER): Payer: Medicare Other | Admitting: Internal Medicine

## 2018-09-14 ENCOUNTER — Encounter (INDEPENDENT_AMBULATORY_CARE_PROVIDER_SITE_OTHER): Payer: Self-pay | Admitting: Internal Medicine

## 2018-09-14 DIAGNOSIS — K862 Cyst of pancreas: Secondary | ICD-10-CM | POA: Diagnosis not present

## 2018-09-14 NOTE — Progress Notes (Signed)
Subjective:    Patient ID: [name], male    DOB: Jun 06, 1961, 58 y.o.   MRN: [id number]  HPI follow up for chronic pancreatitis and gastroparesis.  Last seen by Dr Laural Golden 10/13/2017. He tells me he is doing okay. He has been have some back pain.  BMs are normal. He takes Miralax as needed.  Minimal weight loss.  No abdominal pain.  Seen in the ED 08/27/2018 with back pain x 3 weeks. Underwent a CT Renal Stone scan . Treated for possible kidney .    08/27/2018 CT Renal Stone: Pancreas: Irregular somewhat bilobed and partially calcified lesion at the pancreatic tail again seen, grossly similar to prior CT measuring up to approximately 2.8 cm, not well delineated on this noncontrast examination. Pancreatic duct mildly dilated up to 6 mm at the level of the mid pancreatic body. Remainder of the pancreas is normal in appearance.   Cardiac stent and CABG and maintained on Plavix.   Review of Systems . Past Medical History:  Diagnosis Date  . Anemia   . 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 Chronic gastritis   . Coronary artery disease    Patent Stent to Circ.  Patent coronary bypass grafts with a free radial graft to PDA and LIMA to the  diag.  . Diabetes mellitus   . Dyslipidemia    Poorly controlled, probably related to his DM (Some of this is secondary to his inability to afford medications).  . ED (erectile dysfunction)   . Esophageal yeast infection ([place])   . Metaplasia of esophagus   . Pancreatitis chronic    Pseudocyst  . Polycythemia vera(238.4) 04/26/2012    Past Surgical History:  Procedure Laterality Date  . APPENDECTOMY    . CARDIAC CATHETERIZATION    . CHOLECYSTECTOMY    . COLONOSCOPY  07/31/1985  . COLONOSCOPY N/A 03/11/2016   Procedure: COLONOSCOPY;  Surgeon: Rogene Houston, MD;  Location: [facility];  Service: Endoscopy;  Laterality: N/A;  . CORONARY ANGIOPLASTY WITH STENT PLACEMENT  2006  . CORONARY ARTERY BYPASS GRAFT  11/24/2004  .  ESOPHAGOGASTRODUODENOSCOPY  09/19/01  . ESOPHAGOGASTRODUODENOSCOPY N/A 03/11/2016   Procedure: ESOPHAGOGASTRODUODENOSCOPY (EGD);  Surgeon: Rogene Houston, MD;  Location: [facility];  Service: Endoscopy;  Laterality: N/A;  12:00  . FINGER TENDON REPAIR     in middle finger  . KNEE SURGERY     left knee  . NASAL SEPTUM SURGERY      Allergies  Allergen Reactions  . Metoclopramide Hcl Hives    Patient became very spastic  . Anesthesia S-I-60 Other (See Comments)    Pancreatic attack.  . Enalapril Cough  . Iodinated Diagnostic Agents     Rash, nausea and flushing  . Iohexol      Desc: HIVES,SOB NEEDS PREP.MEDS   . Sulfonamide Derivatives     Large hives  . Penicillins Rash    Has patient had a PCN reaction causing immediate rash, facial/tongue/throat swelling, SOB or lightheadedness with hypotension: Yes Has patient had a PCN reaction causing severe rash involving mucus membranes or skin necrosis: No Has patient had a PCN reaction that required hospitalization No Has patient had a PCN reaction occurring within the last 10 years: No. If all of the above answers are "NO", then may proceed with Cephalosporin use.   . Sulfa Antibiotics Rash    Other Reaction: Not Assessed    Current Outpatient Medications on File Prior to Visit  Medication  Sig Dispense Refill  . Cholecalciferol (VITAMIN D-1000 MAX ST) 1000 units tablet Take 4,000 Units by mouth daily.    . clopidogrel (PLAVIX) 75 MG tablet Take 1 tablet (75 mg total) by mouth daily.    . diazepam (VALIUM) 10 MG tablet Take 10 mg by mouth at bedtime as needed for anxiety or sleep. As Needed    . diphenhydrAMINE (BENADRYL) 25 MG tablet Take 1 tablet by mouth as needed. As needed for itching    . ferrous sulfate 325 (65 FE) MG tablet Take 325 mg by mouth daily with breakfast.    . gabapentin (NEURONTIN) 300 MG capsule Take 300 mg by mouth daily.    Marland Kitchen glipiZIDE (GLUCOTROL) 10 MG tablet Take 10 mg by mouth 2 (two) times daily.     .  hydrochlorothiazide (HYDRODIURIL) 12.5 MG tablet Take 1 tablet by mouth daily.    Marland Kitchen HYDROcodone-acetaminophen (NORCO) 10-325 MG tablet Take 1 tablet every 4 (four) hours as needed by mouth for severe pain.     Marland Kitchen insulin NPH-regular Human (NOVOLIN 70/30) (70-30) 100 UNIT/ML injection Inject 40 Units into the skin.    Marland Kitchen losartan-hydrochlorothiazide (HYZAAR) 100-12.5 MG tablet Take 1 tablet by mouth daily.    . metFORMIN (GLUCOPHAGE) 1000 MG tablet Take 1,000 mg by mouth 2 (two) times daily with a meal.     . methocarbamol (ROBAXIN) 500 MG tablet Take 1 or 2 po Q 6hrs for muscle pain 50 tablet 0  . metoprolol tartrate (LOPRESSOR) 100 MG tablet Take 100 mg by mouth 2 (two) times daily.    . midazolam (VERSED) 2 MG/2ML SOLN injection     . naproxen (NAPROSYN) 500 MG tablet Take 500 mg by mouth 2 (two) times daily as needed for mild pain.     . nitroGLYCERIN (NITROSTAT) 0.4 MG SL tablet Place 0.4 mg under the tongue every 5 (five) minutes as needed for chest pain. Reported on 02/26/2016    . NON FORMULARY Domperidone 10 mg Patient takes 2 by mouth daily    . omega-3 acid ethyl esters (LOVAZA) 1 g capsule Take 2 capsules (2 g total) by mouth 2 (two) times daily. 360 capsule 3  . ondansetron (ZOFRAN) 4 MG tablet Take 4 mg by mouth every 8 (eight) hours as needed for nausea or vomiting.    . Pancrelipase, Lip-Prot-Amyl, 6000 units CPEP Take by mouth as needed.     . pantoprazole (PROTONIX) 40 MG tablet TAKE 1 TABLET BY MOUTH DAILY. (Patient taking differently: TAKE 1 TABLET BY MOUTH ONCE DAILY AS NEEDED FOR INDIGESTION.) 30 tablet 1  . PARoxetine (PAXIL) 10 MG tablet Take 10 mg by mouth daily.      . promethazine (PHENERGAN) 25 MG tablet Take 25 mg by mouth every 8 (eight) hours as needed for nausea or vomiting.     Marland Kitchen tetrahydrozoline 0.05 % ophthalmic solution Apply to eye.     No current facility-administered medications on file prior to visit.         Objective:   Physical Exam Blood pressure (!)  144/88, pulse 68, temperature (!) 97.4 F (36.3 C), height 5' 9.5" (1.765 m), weight 160 lb 1.6 oz (72.6 kg). Alert and oriented. Skin warm and dry. Oral mucosa is moist.   . Sclera anicteric, conjunctivae is pink. Thyroid not enlarged. No cervical lymphadenopathy. Lungs clear. Heart regular rate and rhythm.  Abdomen is soft. Bowel sounds are positive. No hepatomegaly. No abdominal masses felt. No tenderness.  No edema to lower extremities.  Patient is alert and oriented.         Assessment & Plan:    Chronic pancreatitis.  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  He has ossified bilobed cystic lesion in tail of pancreas.  This was also noted on prior study of August 2013 and felt to be a cyst and not a neoplasm.  Will get lab work from [employer] office .  Chronic GERD.  He had a EGD back in July 2017 and was noted to have short segment Barrett's esophagus.  He therefore should take pantoprazole daily rather than as needed basis. Pancreatic cyst:MRI abdomen w/wo CM ordered for f/u.     Diabetic gastroparesis.  Patient will continue domperidone.    OV in 1 year.

## 2018-09-26 ENCOUNTER — Ambulatory Visit (HOSPITAL_COMMUNITY)
Admission: RE | Admit: 2018-09-26 | Discharge: 2018-09-26 | Disposition: A | Discharge status: Home / Self Care | Payer: Medicare Other | Source: Ambulatory Visit | Attending: Internal Medicine | Admitting: Internal Medicine

## 2018-09-26 DIAGNOSIS — K862 Cyst of pancreas: Secondary | ICD-10-CM

## 2018-09-27 ENCOUNTER — Telehealth (INDEPENDENT_AMBULATORY_CARE_PROVIDER_SITE_OTHER): Payer: Self-pay | Admitting: *Deleted

## 2018-09-27 NOTE — Telephone Encounter (Signed)
Patient is to have a MR Abd  W/WO contrast on 10/04/2018. The following medication was called to the [facility]. Prednisone 50 mg. Patient will take by mouth at 13 hours prior to study , 7 hours prior to study, and 1 hour prior to to the study. He is to also take the Benadryl 50 my mouth 1 hour prior to the procedure. Patient ask that I not call the Benadryl 50 mg as he has that at home. He was advised to take that 1 hour prior to the study.

## 2018-10-04 ENCOUNTER — Ambulatory Visit (HOSPITAL_COMMUNITY)
Admission: RE | Admit: 2018-10-04 | Discharge: 2018-10-04 | Disposition: A | Discharge status: Home / Self Care | Payer: Medicare Other | Source: Ambulatory Visit | Attending: Internal Medicine | Admitting: Internal Medicine

## 2018-10-04 ENCOUNTER — Other Ambulatory Visit (INDEPENDENT_AMBULATORY_CARE_PROVIDER_SITE_OTHER): Payer: Self-pay | Admitting: Internal Medicine

## 2018-10-04 DIAGNOSIS — K862 Cyst of pancreas: Secondary | ICD-10-CM

## 2018-10-04 MED ORDER — GADOBUTROL 1 MMOL/ML IV SOLN
7.5000 mL | Freq: Once | INTRAVENOUS | Status: AC | PRN
  Administered 2018-10-04: 7.5 mL via INTRAVENOUS

## 2018-10-09 DIAGNOSIS — D751 Secondary polycythemia: Secondary | ICD-10-CM | POA: Insufficient documentation

## 2018-11-02 ENCOUNTER — Other Ambulatory Visit: Payer: Self-pay | Admitting: Cardiology

## 2018-11-03 NOTE — Telephone Encounter (Signed)
Rx(s) sent to pharmacy electronically.  

## 2019-02-08 ENCOUNTER — Other Ambulatory Visit: Payer: Self-pay

## 2019-02-08 ENCOUNTER — Other Ambulatory Visit: Payer: Self-pay | Admitting: Internal Medicine

## 2019-02-08 DIAGNOSIS — Z20822 Contact with and (suspected) exposure to covid-19: Secondary | ICD-10-CM

## 2019-02-12 LAB — NOVEL CORONAVIRUS, NAA: SARS-CoV-2, NAA: NOT DETECTED

## 2019-02-13 ENCOUNTER — Other Ambulatory Visit: Payer: Medicare Other

## 2019-02-13 ENCOUNTER — Other Ambulatory Visit: Payer: Self-pay

## 2019-02-13 ENCOUNTER — Telehealth: Payer: Self-pay | Admitting: Family Medicine

## 2019-02-13 DIAGNOSIS — Z20822 Contact with and (suspected) exposure to covid-19: Secondary | ICD-10-CM

## 2019-02-13 NOTE — Telephone Encounter (Signed)
Pt given Covid negative results. Pt was concerned that the test was done incorrectly. Pt stated that he would like a retest. Pt stated that the swab was inserted just inside the nostril. Pt has an appt today and asked pt to ask Dr Karie Kirks to know.  Spoke to Butch Penny- at [facility] and informed her of above message. Unable to route to practice. Butch Penny given number to [facility] community [phone] in case need for retesting.

## 2019-02-15 LAB — NOVEL CORONAVIRUS, NAA: SARS-CoV-2, NAA: NOT DETECTED

## 2019-08-23 ENCOUNTER — Other Ambulatory Visit: Payer: Self-pay

## 2019-08-23 ENCOUNTER — Ambulatory Visit: Payer: Medicare Other | Attending: Internal Medicine

## 2019-08-23 DIAGNOSIS — Z20822 Contact with and (suspected) exposure to covid-19: Secondary | ICD-10-CM

## 2019-08-25 LAB — NOVEL CORONAVIRUS, NAA: SARS-CoV-2, NAA: NOT DETECTED

## 2019-08-31 ENCOUNTER — Ambulatory Visit: Payer: Medicare Other | Admitting: Cardiology

## 2019-09-20 DIAGNOSIS — Z7189 Other specified counseling: Secondary | ICD-10-CM | POA: Insufficient documentation

## 2019-09-20 NOTE — Progress Notes (Signed)
Cardiology Office Note   Date:  09/21/2019   ID:  [name], [name] [birth year], MRN [id number]  PCP:  Lemmie Evens, MD  Cardiologist:   Minus Breeding, MD   Chief Complaint  Patient presents with  . Chest Pain      History of Present Illness: [name] is a 59 y.o. male who presents for followup of his known coronary disease.  In 2016 he had chest pain but stress perfusion study demonstrated an EF of 60% with no ischemia.    He had an echo in Jan last year.  This was normal.  This was follow-up with antireviewed from [facility].  He is being evaluated for possible polycythemia vera.  Is not clear that he has this.  He does some activity such as walking to the mailbox and pulling a trash can.  He does push-ups.  He has occasional shooting chest pain.  He has occasional shooting chest pain.  He has some decreased strength.  Last Friday he had an episode of chest pain that happened at rest.  It was somewhat similar to previous angina.  He took about 5 nitroglycerin and it went away.  He has not had any of that prior or since.  He otherwise feels okay.  He is not able to bring this on the physical activity mention.  He has had no new shortness of breath, PND or orthopnea.  He has had no weight gain or edema.   Past Medical History:  Diagnosis Date  . Anemia   . 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 Chronic gastritis   . Coronary artery disease    Patent Stent to Circ.  Patent coronary bypass grafts with a free radial graft to PDA and LIMA to the  diag.  . Diabetes mellitus   . Dyslipidemia    Poorly controlled, probably related to his DM (Some of this is secondary to his inability to afford medications).  . ED (erectile dysfunction)   . Esophageal yeast infection ([place])   . Metaplasia of esophagus   . Pancreatitis chronic    Pseudocyst  . Polycythemia vera(238.4) 04/26/2012    Past Surgical History:  Procedure Laterality Date  . APPENDECTOMY    . CARDIAC CATHETERIZATION     . CHOLECYSTECTOMY    . COLONOSCOPY  07/31/1985  . COLONOSCOPY N/A 03/11/2016   Procedure: COLONOSCOPY;  Surgeon: Rogene Houston, MD;  [facility];  Service: Endoscopy;  Laterality: N/A;  . CORONARY ANGIOPLASTY WITH STENT PLACEMENT  2006  . CORONARY ARTERY BYPASS GRAFT  11/24/2004  . ESOPHAGOGASTRODUODENOSCOPY  09/19/01  . ESOPHAGOGASTRODUODENOSCOPY N/A 03/11/2016   Procedure: ESOPHAGOGASTRODUODENOSCOPY (EGD);  Surgeon: Rogene Houston, MD;  Location: [facility];  Service: Endoscopy;  Laterality: N/A;  12:00  . FINGER TENDON REPAIR     in middle finger  . KNEE SURGERY     left knee  . NASAL SEPTUM SURGERY       Current Outpatient Medications  Medication Sig Dispense Refill  . Omega-3 Fatty Acids (FISH OIL) 1000 MG CAPS Take by mouth.    . Cholecalciferol (VITAMIN D-1000 MAX ST) 1000 units tablet Take 4,000 Units by mouth daily.    . clopidogrel (PLAVIX) 75 MG tablet Take 1 tablet (75 mg total) by mouth daily.    . diazepam (VALIUM) 10 MG tablet Take 10 mg by mouth at bedtime as needed for anxiety or sleep. As Needed    . diphenhydrAMINE (BENADRYL)  25 MG tablet Take 1 tablet by mouth as needed. As needed for itching    . ferrous sulfate 325 (65 FE) MG tablet Take 325 mg by mouth daily with breakfast.    . gabapentin (NEURONTIN) 100 MG capsule Take 100-200 mg by mouth 3 (three) times daily as needed.    . gabapentin (NEURONTIN) 300 MG capsule Take 300 mg by mouth daily.    Marland Kitchen glipiZIDE (GLUCOTROL) 10 MG tablet Take 10 mg by mouth 2 (two) times daily.     . hydrochlorothiazide (HYDRODIURIL) 12.5 MG tablet Take 1 tablet by mouth daily.    Marland Kitchen HYDROcodone-acetaminophen (NORCO) 10-325 MG tablet Take 1 tablet every 4 (four) hours as needed by mouth for severe pain.     Marland Kitchen insulin NPH-regular Human (NOVOLIN 70/30) (70-30) 100 UNIT/ML injection Inject 40 Units into the skin.    Marland Kitchen losartan-hydrochlorothiazide (HYZAAR) 100-12.5 MG tablet Take 1 tablet by mouth daily.    . metFORMIN  (GLUCOPHAGE) 1000 MG tablet Take 1,000 mg by mouth 2 (two) times daily with a meal.     . methocarbamol (ROBAXIN) 500 MG tablet Take 1 or 2 po Q 6hrs for muscle pain 50 tablet 0  . metoprolol tartrate (LOPRESSOR) 100 MG tablet Take 100 mg by mouth 2 (two) times daily.    . midazolam (VERSED) 2 MG/2ML SOLN injection     . naproxen (NAPROSYN) 500 MG tablet Take 500 mg by mouth 2 (two) times daily as needed for mild pain.     . nitroGLYCERIN (NITROSTAT) 0.4 MG SL tablet Place 0.4 mg under the tongue every 5 (five) minutes as needed for chest pain. Reported on 02/26/2016    . NON FORMULARY Domperidone 10 mg Patient takes 2 by mouth daily    . omega-3 acid ethyl esters (LOVAZA) 1 g capsule Take 2 capsules (2 g total) by mouth 2 (two) times daily. 360 capsule 2  . ondansetron (ZOFRAN) 4 MG tablet Take 4 mg by mouth every 8 (eight) hours as needed for nausea or vomiting.    . Pancrelipase, Lip-Prot-Amyl, 6000 units CPEP Take by mouth as needed.     . pantoprazole (PROTONIX) 40 MG tablet TAKE 1 TABLET BY MOUTH DAILY. (Patient taking differently: TAKE 1 TABLET BY MOUTH ONCE DAILY AS NEEDED FOR INDIGESTION.) 30 tablet 1  . PARoxetine (PAXIL) 10 MG tablet Take 10 mg by mouth daily.      . promethazine (PHENERGAN) 25 MG tablet Take 25 mg by mouth every 8 (eight) hours as needed for nausea or vomiting.     . sildenafil (VIAGRA) 50 MG tablet Take 1 tablet (50 mg total) by mouth daily as needed for erectile dysfunction. 10 tablet 3  . tetrahydrozoline 0.05 % ophthalmic solution Apply to eye.     No current facility-administered medications for this visit.    Allergies:   Metoclopramide hcl, Enalapril, Iodinated diagnostic agents, Iohexol, Propofol, Sulfonamide derivatives, Penicillins, and Sulfa antibiotics    ROS:  Please see the history of present illness.   Otherwise, review of systems are positive for ED.   All other systems are reviewed and negative.    PHYSICAL EXAM: VS:  BP 132/80   Pulse 64   Temp  (!) 96.5 F (35.8 C)   Ht 5\' 9"  (1.753 m)   Wt 161 lb (73 kg)   BMI 23.78 kg/m  , BMI Body mass index is 23.78 kg/m. NECK:  No jugular venous distention, waveform within normal limits, carotid upstroke brisk and symmetric, no  bruits, no thyromegaly LUNGS:  Clear to auscultation bilaterally CHEST:  Well healed sternotomy scar. HEART:  PMI not displaced or sustained,S1 and S2 within normal limits, no S3, no S4, no clicks, no rubs, no murmurs ABD:  Flat, positive bowel sounds normal in frequency in pitch, no bruits, no rebound, no guarding, no midline pulsatile mass, no hepatomegaly, no splenomegaly EXT:  2 plus pulses throughout, no edema, no cyanosis no clubbing SKIN:  No rashes no nodules    EKG:  EKG is ordered today. The ekg ordered today demonstrates sinus rhythm, rate 84, axis within normal limits, intervals within normal limits, no acute ST-T wave changes.   Recent Labs: No results found for requested labs within last 8760 hours.      Wt Readings from Last 3 Encounters:  09/21/19 161 lb (73 kg)  09/14/18 160 lb 1.6 oz (72.6 kg)  08/27/18 164 lb (74.4 kg)      Other studies Reviewed: Additional studies/ records that were reviewed today include: None. Review of the above records demonstrates:  Please see elsewhere in the note.     ASSESSMENT AND PLAN:   CAD:    The patient had one episode of chest discomfort and had some typical and some atypical features.  However, this does not constitute new onset resting discomfort and he is able to be exerting since then without bringing on discomfort.   HTN:   The blood pressure is at target.  No change in therapy.   HYPERLIPIDEMIA:    He has been intolerant of Repatha and statins.  He is due to have lipids but I would like to see these results although he is not any good therapy for this at this point.   DM: His last A1C was he says his A1c is now greater than 10 and he has had his meds changed by his primary  provider.  ED:  I will give him a prescription for Viagra.  He understands he cannot use nitroglycerin.  COVID EDUCATION: He has no interest in the vaccine.   Current medicines are reviewed at length with the patient today.  The patient does not have concerns regarding medicines.  The following changes have been made:  no change  Labs/ tests ordered today include: None No orders of the defined types were placed in this encounter.    Disposition:   FU with me in six months or sooner if needed.     Signed, Minus Breeding, MD  09/21/2019 4:20 PM    [facility]

## 2019-09-21 ENCOUNTER — Ambulatory Visit: Payer: Medicare Other | Admitting: Cardiology

## 2019-09-21 ENCOUNTER — Encounter: Payer: Self-pay | Admitting: Cardiology

## 2019-09-21 ENCOUNTER — Other Ambulatory Visit: Payer: Self-pay

## 2019-09-21 VITALS — BP 132/80 | HR 64 | Temp 96.5°F | Ht 69.0 in | Wt 161.0 lb

## 2019-09-21 DIAGNOSIS — I2510 Atherosclerotic heart disease of native coronary artery without angina pectoris: Secondary | ICD-10-CM | POA: Diagnosis not present

## 2019-09-21 DIAGNOSIS — E785 Hyperlipidemia, unspecified: Secondary | ICD-10-CM | POA: Diagnosis not present

## 2019-09-21 DIAGNOSIS — I10 Essential (primary) hypertension: Secondary | ICD-10-CM | POA: Diagnosis not present

## 2019-09-21 DIAGNOSIS — R079 Chest pain, unspecified: Secondary | ICD-10-CM | POA: Diagnosis not present

## 2019-09-21 DIAGNOSIS — Z7189 Other specified counseling: Secondary | ICD-10-CM | POA: Diagnosis not present

## 2019-09-21 MED ORDER — SILDENAFIL CITRATE 50 MG PO TABS: 50.0000 mg | ORAL_TABLET | Freq: Every day | ORAL | 3 refills | Status: DC | PRN

## 2019-09-21 MED ORDER — OMEGA-3-ACID ETHYL ESTERS 1 G PO CAPS: 2.0000 | ORAL_CAPSULE | Freq: Two times a day (BID) | ORAL | 2 refills | Status: DC

## 2019-09-21 NOTE — Patient Instructions (Addendum)
Medication Instructions:  Viagra 50mg  daily as needed *If you need a refill on your cardiac medications before your next appointment, please call your pharmacy*  Lab Work: None  Testing/Procedures: None  Follow-Up: At [facility], you and your health needs are our priority.  As part of our continuing mission to provide you with exceptional heart care, we have created designated Provider Care Teams.  These Care Teams include your primary Cardiologist (physician) and Advanced Practice Providers (APPs -  Physician Assistants and Nurse Practitioners) who all work together to provide you with the care you need, when you need it.  Your next appointment:   6 month(s)  You will receive a reminder letter in the mail two months in advance. If you don't receive a letter, please call our office to schedule the follow-up appointment.   The format for your next appointment:   In Person  Provider:   Minus Breeding, MD

## 2019-09-28 DIAGNOSIS — E781 Pure hyperglyceridemia: Secondary | ICD-10-CM | POA: Diagnosis not present

## 2019-09-28 DIAGNOSIS — Z13220 Encounter for screening for lipoid disorders: Secondary | ICD-10-CM | POA: Diagnosis not present

## 2019-09-28 DIAGNOSIS — I10 Essential (primary) hypertension: Secondary | ICD-10-CM | POA: Diagnosis not present

## 2019-09-28 DIAGNOSIS — D500 Iron deficiency anemia secondary to blood loss (chronic): Secondary | ICD-10-CM | POA: Diagnosis not present

## 2019-09-28 DIAGNOSIS — G4700 Insomnia, unspecified: Secondary | ICD-10-CM | POA: Diagnosis not present

## 2019-09-28 DIAGNOSIS — K861 Other chronic pancreatitis: Secondary | ICD-10-CM | POA: Diagnosis not present

## 2019-09-28 DIAGNOSIS — Z125 Encounter for screening for malignant neoplasm of prostate: Secondary | ICD-10-CM | POA: Diagnosis not present

## 2019-09-28 DIAGNOSIS — N410 Acute prostatitis: Secondary | ICD-10-CM | POA: Diagnosis not present

## 2019-09-28 DIAGNOSIS — E1142 Type 2 diabetes mellitus with diabetic polyneuropathy: Secondary | ICD-10-CM | POA: Diagnosis not present

## 2019-09-28 DIAGNOSIS — E1165 Type 2 diabetes mellitus with hyperglycemia: Secondary | ICD-10-CM | POA: Diagnosis not present

## 2019-09-28 DIAGNOSIS — K3184 Gastroparesis: Secondary | ICD-10-CM | POA: Diagnosis not present

## 2019-10-05 DIAGNOSIS — E1142 Type 2 diabetes mellitus with diabetic polyneuropathy: Secondary | ICD-10-CM | POA: Diagnosis not present

## 2019-10-05 DIAGNOSIS — K861 Other chronic pancreatitis: Secondary | ICD-10-CM | POA: Diagnosis not present

## 2019-10-05 DIAGNOSIS — I10 Essential (primary) hypertension: Secondary | ICD-10-CM | POA: Diagnosis not present

## 2019-10-05 DIAGNOSIS — D45 Polycythemia vera: Secondary | ICD-10-CM | POA: Diagnosis not present

## 2019-10-08 ENCOUNTER — Other Ambulatory Visit (INDEPENDENT_AMBULATORY_CARE_PROVIDER_SITE_OTHER): Payer: PPO

## 2019-10-08 DIAGNOSIS — I10 Essential (primary) hypertension: Secondary | ICD-10-CM | POA: Diagnosis not present

## 2019-10-30 DIAGNOSIS — M7551 Bursitis of right shoulder: Secondary | ICD-10-CM | POA: Diagnosis not present

## 2019-10-30 DIAGNOSIS — M19019 Primary osteoarthritis, unspecified shoulder: Secondary | ICD-10-CM | POA: Diagnosis not present

## 2019-10-30 DIAGNOSIS — M25361 Other instability, right knee: Secondary | ICD-10-CM | POA: Diagnosis not present

## 2019-10-30 DIAGNOSIS — M1711 Unilateral primary osteoarthritis, right knee: Secondary | ICD-10-CM | POA: Diagnosis not present

## 2019-11-12 DIAGNOSIS — D751 Secondary polycythemia: Secondary | ICD-10-CM | POA: Diagnosis not present

## 2019-11-12 DIAGNOSIS — D45 Polycythemia vera: Secondary | ICD-10-CM | POA: Diagnosis not present

## 2019-11-15 DIAGNOSIS — I7381 Erythromelalgia: Secondary | ICD-10-CM | POA: Diagnosis not present

## 2019-11-15 DIAGNOSIS — D751 Secondary polycythemia: Secondary | ICD-10-CM | POA: Diagnosis not present

## 2019-11-22 DIAGNOSIS — I7381 Erythromelalgia: Secondary | ICD-10-CM | POA: Insufficient documentation

## 2020-01-01 DIAGNOSIS — M7551 Bursitis of right shoulder: Secondary | ICD-10-CM | POA: Diagnosis not present

## 2020-01-01 DIAGNOSIS — G8929 Other chronic pain: Secondary | ICD-10-CM | POA: Diagnosis not present

## 2020-01-01 DIAGNOSIS — D45 Polycythemia vera: Secondary | ICD-10-CM | POA: Diagnosis not present

## 2020-01-01 DIAGNOSIS — M25361 Other instability, right knee: Secondary | ICD-10-CM | POA: Diagnosis not present

## 2020-01-01 DIAGNOSIS — E162 Hypoglycemia, unspecified: Secondary | ICD-10-CM | POA: Diagnosis not present

## 2020-01-01 DIAGNOSIS — M19011 Primary osteoarthritis, right shoulder: Secondary | ICD-10-CM | POA: Diagnosis not present

## 2020-01-01 DIAGNOSIS — E1142 Type 2 diabetes mellitus with diabetic polyneuropathy: Secondary | ICD-10-CM | POA: Diagnosis not present

## 2020-01-01 DIAGNOSIS — M25511 Pain in right shoulder: Secondary | ICD-10-CM | POA: Diagnosis not present

## 2020-01-01 DIAGNOSIS — Z79891 Long term (current) use of opiate analgesic: Secondary | ICD-10-CM | POA: Diagnosis not present

## 2020-01-30 DIAGNOSIS — M25111 Fistula, right shoulder: Secondary | ICD-10-CM | POA: Diagnosis not present

## 2020-01-30 DIAGNOSIS — M25511 Pain in right shoulder: Secondary | ICD-10-CM | POA: Diagnosis not present

## 2020-02-06 ENCOUNTER — Encounter (INDEPENDENT_AMBULATORY_CARE_PROVIDER_SITE_OTHER): Payer: Self-pay | Admitting: Gastroenterology

## 2020-02-06 ENCOUNTER — Ambulatory Visit (INDEPENDENT_AMBULATORY_CARE_PROVIDER_SITE_OTHER): Payer: PPO | Admitting: Gastroenterology

## 2020-02-06 ENCOUNTER — Other Ambulatory Visit: Payer: Self-pay

## 2020-02-06 VITALS — BP 151/89 | HR 64 | Temp 97.9°F | Ht 69.5 in | Wt 168.4 lb

## 2020-02-06 DIAGNOSIS — K862 Cyst of pancreas: Secondary | ICD-10-CM | POA: Diagnosis not present

## 2020-02-06 DIAGNOSIS — K219 Gastro-esophageal reflux disease without esophagitis: Secondary | ICD-10-CM | POA: Diagnosis not present

## 2020-02-06 DIAGNOSIS — E1142 Type 2 diabetes mellitus with diabetic polyneuropathy: Secondary | ICD-10-CM | POA: Diagnosis not present

## 2020-02-06 DIAGNOSIS — Z8719 Personal history of other diseases of the digestive system: Secondary | ICD-10-CM

## 2020-02-06 DIAGNOSIS — E1165 Type 2 diabetes mellitus with hyperglycemia: Secondary | ICD-10-CM | POA: Diagnosis not present

## 2020-02-06 DIAGNOSIS — I10 Essential (primary) hypertension: Secondary | ICD-10-CM | POA: Diagnosis not present

## 2020-02-06 DIAGNOSIS — E781 Pure hyperglyceridemia: Secondary | ICD-10-CM | POA: Diagnosis not present

## 2020-02-06 DIAGNOSIS — Z13220 Encounter for screening for lipoid disorders: Secondary | ICD-10-CM | POA: Diagnosis not present

## 2020-02-06 DIAGNOSIS — D500 Iron deficiency anemia secondary to blood loss (chronic): Secondary | ICD-10-CM | POA: Diagnosis not present

## 2020-02-06 DIAGNOSIS — N410 Acute prostatitis: Secondary | ICD-10-CM | POA: Diagnosis not present

## 2020-02-06 DIAGNOSIS — K3184 Gastroparesis: Secondary | ICD-10-CM | POA: Diagnosis not present

## 2020-02-06 DIAGNOSIS — K861 Other chronic pancreatitis: Secondary | ICD-10-CM | POA: Diagnosis not present

## 2020-02-06 DIAGNOSIS — G4700 Insomnia, unspecified: Secondary | ICD-10-CM | POA: Diagnosis not present

## 2020-02-06 DIAGNOSIS — Z125 Encounter for screening for malignant neoplasm of prostate: Secondary | ICD-10-CM | POA: Diagnosis not present

## 2020-02-06 MED ORDER — LANSOPRAZOLE 30 MG PO CPDR: 30.0000 mg | DELAYED_RELEASE_CAPSULE | Freq: Two times a day (BID) | ORAL | 3 refills | Status: DC

## 2020-02-06 NOTE — Progress Notes (Signed)
Patient profile: [name] is a 59 y.o. male seen for follow-up, last seen in clinic on January 2020.  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  Also has a history of GERD, and gastroparesis maintained on domperidone.  History of Present Illness: [name] is seen today for follow-up.  He reports in March 2021 developing some issues with abdominal swelling.  This seem to worsen with meals and was having some nausea.  It is currently improved over the past few weeks since he has started taking Motilium twice a day instead of 3 times a day, his pancreatic enzyme supplement 4 times a day instead of 6 times a day and also had phlebotomy on Jan 04, 2020.  He does still have some nausea and uses Zofran as needed.  He is not having any vomiting.  His weight is up recently as below.  He has had significant issues with neuropathy that is been difficult to control.  Bowels are usually about every day or every other day.  He does use MiraLAX as needed.  He denies any rectal bleeding or melena.  No significant lower abdominal pain.  GERD is well controlled on lansoprazole, usually takes 40 mg once a day, occasional twice a day depending on symptoms. Does have some chronic oropharyngeal dysphagia to peanuts or bacon in throat area but denies significant esophageal dysphagia.   Wt Readings from Last 3 Encounters:  02/06/20 168 lb 6.4 oz (76.4 kg)  09/21/19 161 lb (73 kg)  09/14/18 160 lb 1.6 oz (72.6 kg)      Last Endoscopy: 2017-irregular Z-line, empiric dilation, normal stomach And duodenum. Colonoscopy-5 mm polyp sigmoid, diverticulosis sigmoid. 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 EGD and colonoscopy in 5 years.   Past Medical History:  Past Medical History:  Diagnosis Date  . Anemia   . 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 Chronic gastritis   . Coronary  artery disease    Patent Stent to Circ.  Patent coronary bypass grafts with a free radial graft to PDA and LIMA to the  diag.  . Diabetes mellitus   . Dyslipidemia    Poorly controlled, probably related to his DM (Some of this is secondary to his inability to afford medications).  . ED (erectile dysfunction)   . Esophageal yeast infection ([place])   . Metaplasia of esophagus   . Pancreatitis chronic    Pseudocyst  . Polycythemia vera(238.4) 04/26/2012    Problem List: Patient Active Problem List   Diagnosis Date Noted  . Educated about COVID-19 virus infection 09/20/2019  . Coronary artery disease of native artery of native heart with stable angina pectoris ([place]) 04/16/2017  . Polycythemia vera(238.4) 04/26/2012  . H/O chronic pancreatitis 01/31/2012  . GERD (gastroesophageal reflux disease) 01/31/2012  . Hypertension 11/08/2011  . DM 05/20/2009  . Dyslipidemia 05/20/2009  . CAD 05/20/2009  . CHEST PAIN, ACUTE 05/20/2009    Past Surgical History: Past Surgical History:  Procedure Laterality Date  . APPENDECTOMY    . CARDIAC CATHETERIZATION    . CHOLECYSTECTOMY    . COLONOSCOPY  07/31/1985  . COLONOSCOPY N/A 03/11/2016   Procedure: COLONOSCOPY;  Surgeon: Rogene Houston, MD;  Location[facility];  Service: Endoscopy;  Laterality: N/A;  . CORONARY ANGIOPLASTY WITH STENT PLACEMENT  2006  . CORONARY ARTERY BYPASS GRAFT  11/24/2004  . ESOPHAGOGASTRODUODENOSCOPY  09/19/01  . ESOPHAGOGASTRODUODENOSCOPY N/A 03/11/2016   Procedure: ESOPHAGOGASTRODUODENOSCOPY (EGD);  Surgeon: Rogene Houston, MD;  [facility];  Service: Endoscopy;  Laterality: N/A;  12:00  . FINGER TENDON REPAIR     in middle finger  . KNEE SURGERY     left knee  . NASAL SEPTUM SURGERY      Allergies: Allergies  Allergen Reactions  . Metoclopramide Hcl Hives    Patient became very spastic  . Enalapril Cough  . Iodinated Diagnostic Agents     Rash, nausea and flushing  . Iohexol      Desc:  HIVES,SOB NEEDS PREP.MEDS   . Propofol Other (See Comments)    Pancreatic attack.  . Sulfonamide Derivatives     Large hives  . Penicillins Rash    Has patient had a PCN reaction causing immediate rash, facial/tongue/throat swelling, SOB or lightheadedness with hypotension: Yes Has patient had a PCN reaction causing severe rash involving mucus membranes or skin necrosis: No Has patient had a PCN reaction that required hospitalization No Has patient had a PCN reaction occurring within the last 10 years: No. If all of the above answers are "NO", then may proceed with Cephalosporin use.   . Sulfa Antibiotics Rash    Other Reaction: Not Assessed      Home Medications:  Current Outpatient Medications:  .  Cholecalciferol (VITAMIN D-1000 MAX ST) 1000 units tablet, Take 4,000 Units by mouth daily., Disp: , Rfl:  .  clopidogrel (PLAVIX) 75 MG tablet, Take 1 tablet (75 mg total) by mouth daily., Disp: , Rfl:  .  diazepam (VALIUM) 10 MG tablet, Take 10 mg by mouth at bedtime as needed for anxiety or sleep. As Needed, Disp: , Rfl:  .  glipiZIDE (GLUCOTROL) 10 MG tablet, Take 10 mg by mouth 2 (two) times daily. , Disp: , Rfl:  .  hydrochlorothiazide (HYDRODIURIL) 12.5 MG tablet, Take 1 tablet by mouth daily., Disp: , Rfl:  .  HYDROcodone-acetaminophen (NORCO) 10-325 MG tablet, Take 1 tablet every 4 (four) hours as needed by mouth for severe pain. , Disp: , Rfl:  .  insulin NPH-regular Human (NOVOLIN 70/30) (70-30) 100 UNIT/ML injection, Inject 49 Units into the skin. Before bedtime., Disp: , Rfl:  .  metFORMIN (GLUCOPHAGE) 1000 MG tablet, Take 1,000 mg by mouth 2 (two) times daily with a meal. , Disp: , Rfl:  .  methocarbamol (ROBAXIN) 500 MG tablet, Take 1 or 2 po Q 6hrs for muscle pain, Disp: 50 tablet, Rfl: 0 .  metoprolol tartrate (LOPRESSOR) 100 MG tablet, Take 25 mg by mouth 2 (two) times daily. , Disp: , Rfl:  .  nitroGLYCERIN (NITROSTAT) 0.4 MG SL tablet, Place 0.4 mg under the tongue  every 5 (five) minutes as needed for chest pain. Reported on 02/26/2016, Disp: , Rfl:  .  NON FORMULARY, Domperidone 10 mg Patient takes 2 by mouth daily, Disp: , Rfl:  .  omega-3 acid ethyl esters (LOVAZA) 1 g capsule, Take 2 capsules (2 g total) by mouth 2 (two) times daily., Disp: 360 capsule, Rfl: 2 .  ondansetron (ZOFRAN) 4 MG tablet, Take 4 mg by mouth every 8 (eight) hours as needed for nausea or vomiting., Disp: , Rfl:  .  Pancrelipase, Lip-Prot-Amyl, 6000 units CPEP, Take by mouth as needed. , Disp: , Rfl:  .  PARoxetine (PAXIL) 10 MG tablet, Take 10 mg by mouth daily.  , Disp: , Rfl:  .  promethazine (PHENERGAN) 25 MG tablet, Take 25  mg by mouth every 8 (eight) hours as needed for nausea or vomiting. , Disp: , Rfl:  .  sildenafil (VIAGRA) 50 MG tablet, Take 1 tablet (50 mg total) by mouth daily as needed for erectile dysfunction., Disp: 10 tablet, Rfl: 3 .  diphenhydrAMINE (BENADRYL) 25 MG tablet, Take 1 tablet by mouth as needed. As needed for itching, Disp: , Rfl:  .  lansoprazole (PREVACID) 30 MG capsule, Take 1 capsule (30 mg total) by mouth 2 (two) times daily before a meal., Disp: 60 capsule, Rfl: 3 .  losartan-hydrochlorothiazide (HYZAAR) 100-12.5 MG tablet, Take 1 tablet by mouth daily., Disp: , Rfl:  .  midazolam (VERSED) 2 MG/2ML SOLN injection, , Disp: , Rfl:  .  tetrahydrozoline 0.05 % ophthalmic solution, Apply to eye., Disp: , Rfl:    Family History: family history includes Coronary artery disease in his mother; Diabetes in his brother, mother, and sister; Healthy in his daughter; Heart attack in his mother; Heart disease in his brother; Hyperlipidemia in his brother and daughter; Hypertension in his brother; Pancreatitis in his brother.    Social History:   reports that he has never smoked. He has never used smokeless tobacco. He reports current alcohol use. He reports that he does not use drugs.   Review of Systems: Constitutional: Denies weight loss/weight gain  Eyes:  No changes in vision. ENT: No oral lesions, sore throat.  GI: see HPI.  Heme/Lymph: No easy bruising.  CV: No chest pain.  GU: No hematuria.  Integumentary: No rashes.  Neuro: No headaches.  Psych: No depression/anxiety.  Endocrine: No heat/cold intolerance.  Allergic/Immunologic: No urticaria.  Resp: No cough, SOB.  Musculoskeletal: No joint swelling.    Physical Examination: BP (!) 151/89 (BP Location: Right Arm, Patient Position: Sitting, Cuff Size: Normal)   Pulse 64   Temp 97.9 F (36.6 C) (Oral)   Ht 5' 9.5" (1.765 m)   Wt 168 lb 6.4 oz (76.4 kg)   BMI 24.51 kg/m  Gen: NAD, alert and oriented x 4 HEENT: PEERLA, EOMI, Neck: supple, no JVD Chest: CTA bilaterally, no wheezes, crackles, or other adventitious sounds CV: RRR, no m/g/c/r Abd: soft, NT, ND, +BS in all four quadrants; no HSM, guarding, ridigity, or rebound tenderness Ext: no edema, well perfused with 2+ pulses, Skin: no rash or lesions noted on observed skin Lymph: no noted LAD  Data Reviewed:   IMPRESSION: MRCP 09/2018  1. Clustered small cystic lesions with some calcification in the tail the pancreas, growing over the last 7 years and currently measuring 3.0 by 1.8 by 2.3 cm. I am uncertain whether there is any communication with the main pancreatic duct, although I doubt it. 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 Pancreatic pseudocyst is a less likely differential diagnostic consideration. Intraductal papillary mucinous neoplasm is not suspected given the calcifications. Given the very slow growth, consider surveillance by pancreatic protocol MRI in 2 years time. 2. Partial pancreas divisum. 3.  Aortic Atherosclerosis (ICD10-I70.0). 4. Fatty tissues in the paraesophageal region likely represent herniation of adipose tissue through the hiatus.  Assessment/Plan: Mr. Strey is a 59 y.o. male seen for follow-up of multiple GI issues.  1.  Pancreatitis-due to familial triglyceridemia, he has  repeat labs scheduled today with his PCP and will have these faxed to our office.  Also has a history of a pancreatic cyst and is due for MRCP next February.  Feels current dose of pancreatic enzymes are working well.  His prior GI symptoms improved with  phlebotomy May 2021.  2.  Gastroparesis-controlled on Motilium which he is currently taking twice a day.  He is compliant with diet modifications. Last A1c was 10, having repeated today  3.  Constipation-well-controlled with as needed MiraLAX.  4.  History of colon polyps and Barrett's esophagus-due for EGD colonoscopy next year. On PPI.    We will contact patient when lab results return.  Case discussed with Dr. Ebbie Ridge was seen today for follow-up.  Diagnoses and all orders for this visit:  Gastroesophageal reflux disease, unspecified whether esophagitis present  Cyst of pancreas  H/O chronic pancreatitis  Gastroparesis  Other orders -     lansoprazole (PREVACID) 30 MG capsule; Take 1 capsule (30 mg total) by mouth 2 (two) times daily before a meal.        I personally performed the service, non-incident to. (WP)  Laurine Blazer, [facility]

## 2020-02-06 NOTE — Patient Instructions (Signed)
I will discuss labs w/ Dr Rehman and contact you with recommendations.  

## 2020-02-08 DIAGNOSIS — S43431A Superior glenoid labrum lesion of right shoulder, initial encounter: Secondary | ICD-10-CM | POA: Diagnosis not present

## 2020-02-08 DIAGNOSIS — M5410 Radiculopathy, site unspecified: Secondary | ICD-10-CM | POA: Diagnosis not present

## 2020-02-08 NOTE — Addendum Note (Signed)
Addended by: Rogene Houston on: 02/08/2020 03:33 PM   Modules accepted: Orders

## 2020-02-20 ENCOUNTER — Ambulatory Visit: Payer: PPO | Admitting: Neurology

## 2020-02-20 ENCOUNTER — Other Ambulatory Visit: Payer: Self-pay

## 2020-02-20 VITALS — BP 146/91 | HR 72 | Ht 69.5 in | Wt 165.0 lb

## 2020-02-20 DIAGNOSIS — R202 Paresthesia of skin: Secondary | ICD-10-CM | POA: Diagnosis not present

## 2020-02-20 DIAGNOSIS — E0842 Diabetes mellitus due to underlying condition with diabetic polyneuropathy: Secondary | ICD-10-CM

## 2020-02-20 NOTE — Progress Notes (Signed)
Subjective:    Patient ID: [name] is a 59 y.o. male.  HPI     Star Age, MD, PhD [facility] [address], [place] [address]  Dear Dr. Case,   I saw your patient, [name], upon your kind request in my neurologic clinic today for initial consultation of his numbness and tingling, concern for neuropathy.  The patient is unaccompanied today.  As you know, [name] is a 59 year old right-handed gentleman with an underlying medical history of hyperlipidemia, diabetes, coronary artery disease, anemia, chronic pancreatitis, polycythemia, and Left shoulder bursitis, who reports numbness and tingling in the hands and feet for the past 2.5 years. He had noted a decrease in his ability to feel in the balls of his feet first and it still feels like a spongy feeling at times. He was on gabapentin per PCP for about 1.5 years, up to a total dose of 1200 mg daily. He was recently switched to Lyrica some 3 months ago. He has hand paresthesias in the hands for about 1 year. Occasionally, suddenly, he will have a stinging sensation which may affect larger parts of his body, across the chest and into both arms. He is scheduled for a C spine MRI through your office on 02/26/20. 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 He has been diabetic for years, with suboptimal control, latest A1c 10.6, per patient. He has a strong FHx of DM, including mother, sister and youngest brother, who past away at age 78, he lost another brother at age 57. He now has one brother, age 94 and one sister, age 78. He lives with his wife. He is a non-smoker and drinks alcohol rarely, caffeine about 12 oz of coffee and otherwise mostly water.  He fell about 2 years ago and hurt his L knee. He fell off of a ladder. I reviewed your office note from 02/08/2020. He had an MRI of the R shoulder  and injection into the R shoulder, had a prior inj. Into the L shoulder with good success.  He tries to hydrate well with water. He had blood work through PCP not too long ago.   Previously:   06/05/18: [name] is a 59 year old right-handed gentleman with an underlying medical history of hyperlipidemia, coronary artery disease with status post CABG, anxiety, type 2 diabetes, hypertension, history of chronic pancreatitis, reflux disease, and polycythemia vera, who reports a prior diagnosis of obstructive sleep apnea but he no longer has a CPAP machine. He recalls that he woke up about 20 times per hour. I reviewed your office note from 02/14/2018, which you kindly included. He reports a prior diagnosis of sleep apnea and had a CPAP machine several years ago. Prior sleep study results are not available for my review today. A CPAP download was not available today. He has a history of snoring. 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 His Epworth sleepiness score is 5 out of 24, fatigue score is 56 out of 63. He is married and lives with his wife and his daughter. He is a nonsmoker and does not utilize alcohol, drinks caffeine about 16 ounces per day on average in the form of coffee. He also drinks some green tea, and some tea. He has a variable sleep schedule. He has nocturia about 5 times per night, occasional AM HAs. There is  a FHx of OSA in his brother. He often sleeps in his recliner. He has discomfort in his feet at night. He has aches and pains in different areas. He takes hydrocodone typically 1-1/2 pills daily. Sometimes he has trouble going to sleep and takes Ambien as needed, also Valium as needed.  His Past Medical History Is Significant For: Past Medical History:  Diagnosis Date  . Anemia   . Bypass graft stenosis ([place])    '06  . Chronic gastritis   . Coronary artery disease    Patent Stent to Circ.  Patent coronary bypass grafts with a free radial graft to PDA and LIMA to the   diag.  . Diabetes mellitus   . Dyslipidemia    Poorly controlled, probably related to his DM (Some of this is secondary to his inability to afford medications).  . ED (erectile dysfunction)   . Esophageal yeast infection ([place])   . Metaplasia of esophagus   . Pancreatitis chronic    Pseudocyst  . Polycythemia vera(238.4) 04/26/2012    His Past Surgical History Is Significant For: Past Surgical History:  Procedure Laterality Date  . APPENDECTOMY    . CARDIAC CATHETERIZATION    . CHOLECYSTECTOMY    . COLONOSCOPY  07/31/1985  . COLONOSCOPY N/A 03/11/2016   Procedure: COLONOSCOPY;  Surgeon: Rogene Houston, MD;  Location: [facility];  Service: Endoscopy;  Laterality: N/A;  . CORONARY ANGIOPLASTY WITH STENT PLACEMENT  2006  . CORONARY ARTERY BYPASS GRAFT  11/24/2004  . ESOPHAGOGASTRODUODENOSCOPY  09/19/01  . ESOPHAGOGASTRODUODENOSCOPY N/A 03/11/2016   Procedure: ESOPHAGOGASTRODUODENOSCOPY (EGD);  Surgeon: Rogene Houston, MD;  Location: [facility];  Service: Endoscopy;  Laterality: N/A;  12:00  . FINGER TENDON REPAIR     in middle finger  . KNEE SURGERY     left knee  . NASAL SEPTUM SURGERY      His Family History Is Significant For: Family History  Problem Relation Age of Onset  . Coronary artery disease Mother        strong family history of  . Heart attack Mother        dying of (at age 48)  . Diabetes Mother   . Diabetes Sister   . Diabetes Brother   . Pancreatitis Brother   . Healthy Daughter   . Hyperlipidemia Daughter   . Heart disease Brother   . Hypertension Brother   . Hyperlipidemia Brother     His Social History Is Significant For: Social History   Socioeconomic History  . Marital status: Married    Spouse name: Not on file  . Number of children: Not on file  . Years of education: Not on file  . Highest education level: Not on file  Occupational History  . Not on file  Tobacco Use  . Smoking status: Never Smoker  . Smokeless tobacco: Never Used   Substance and Sexual Activity  . Alcohol use: Yes    Comment: Patient may drink a beer or glass of wine a couple times a month  . Drug use: No  . Sexual activity: Not on file  Other Topics Concern  . Not on file  Social History Narrative   The patient lives in [place] with his wife.  He is     disabled secondary to coronary artery disease and pancreatitis.  He does     not smoke, he does not drink alcohol.  There is no drug use.    Social Determinants of Health  Financial Resource Strain:   . Difficulty of Paying Living Expenses:   Food Insecurity:   . Worried About Charity fundraiser in the Last Year:   . Arboriculturist in the Last Year:   Transportation Needs:   . Film/video editor (Medical):   Marland Kitchen Lack of Transportation (Non-Medical):   Physical Activity:   . Days of Exercise per Week:   . Minutes of Exercise per Session:   Stress:   . Feeling of Stress :   Social Connections:   . Frequency of Communication with Friends and Family:   . Frequency of Social Gatherings with Friends and Family:   . Attends Religious Services:   . Active Member of Clubs or Organizations:   . Attends Archivist Meetings:   Marland Kitchen Marital Status:     His Allergies Are:  Allergies  Allergen Reactions  . Metoclopramide Hcl Hives    Patient became very spastic  . Enalapril Cough  . Iodinated Diagnostic Agents     Rash, nausea and flushing  . Iohexol      Desc: HIVES,SOB NEEDS PREP.MEDS   . Propofol Other (See Comments)    Pancreatic attack.  . Sulfonamide Derivatives     Large hives  . Penicillins Rash    Has patient had a PCN reaction causing immediate rash, facial/tongue/throat swelling, SOB or lightheadedness with hypotension: Yes Has patient had a PCN reaction causing severe rash involving mucus membranes or skin necrosis: No Has patient had a PCN reaction that required hospitalization No Has patient had a PCN reaction occurring within the last 10 years: No. If all  of the above answers are "NO", then may proceed with Cephalosporin use.   . Sulfa Antibiotics Rash    Other Reaction: Not Assessed  :   His Current Medications Are:  Outpatient Encounter Medications as of 02/20/2020  Medication Sig  . Cholecalciferol (VITAMIN D-1000 MAX ST) 1000 units tablet Take 4,000 Units by mouth daily.  . clopidogrel (PLAVIX) 75 MG tablet Take 1 tablet (75 mg total) by mouth daily.  . diazepam (VALIUM) 10 MG tablet Take 10 mg by mouth at bedtime as needed for anxiety or sleep. As Needed  . glipiZIDE (GLUCOTROL) 10 MG tablet Take 10 mg by mouth 2 (two) times daily.   . hydrochlorothiazide (HYDRODIURIL) 12.5 MG tablet Take 1 tablet by mouth daily.  Marland Kitchen HYDROcodone-acetaminophen (NORCO) 10-325 MG tablet Take 1 tablet every 4 (four) hours as needed by mouth for severe pain.   Marland Kitchen insulin NPH-regular Human (NOVOLIN 70/30) (70-30) 100 UNIT/ML injection Inject 49 Units into the skin. Before bedtime.  . lansoprazole (PREVACID) 30 MG capsule Take 1 capsule (30 mg total) by mouth 2 (two) times daily before a meal.  . metFORMIN (GLUCOPHAGE) 1000 MG tablet Take 1,000 mg by mouth 2 (two) times daily with a meal.   . metoprolol tartrate (LOPRESSOR) 100 MG tablet Take 25 mg by mouth 2 (two) times daily.   . nitroGLYCERIN (NITROSTAT) 0.4 MG SL tablet Place 0.4 mg under the tongue every 5 (five) minutes as needed for chest pain. Reported on 02/26/2016  . NON FORMULARY Domperidone 10 mg Patient takes 2 by mouth daily  . omega-3 acid ethyl esters (LOVAZA) 1 g capsule Take 2 capsules (2 g total) by mouth 2 (two) times daily.  . ondansetron (ZOFRAN) 4 MG tablet Take 4 mg by mouth every 8 (eight) hours as needed for nausea or vomiting.  . Pancrelipase, Lip-Prot-Amyl, 6000 units CPEP Take  by mouth as needed.   Marland Kitchen PARoxetine (PAXIL) 10 MG tablet Take 10 mg by mouth daily.    . pregabalin (LYRICA) 150 MG capsule Take 150 mg by mouth 2 (two) times daily.   . promethazine (PHENERGAN) 25 MG tablet Take  25 mg by mouth every 8 (eight) hours as needed for nausea or vomiting.   . sildenafil (VIAGRA) 50 MG tablet Take 1 tablet (50 mg total) by mouth daily as needed for erectile dysfunction.  Marland Kitchen tetrahydrozoline 0.05 % ophthalmic solution Apply to eye.  . [DISCONTINUED] methocarbamol (ROBAXIN) 500 MG tablet Take 1 or 2 po Q 6hrs for muscle pain   No facility-administered encounter medications on file as of 02/20/2020.  :   Review of Systems:  Out of a complete 14 point review of systems, all are reviewed and negative with the exception of these symptoms as listed below:    Review of Systems  Neurological:       Rm 1, alone. Paper referral from [place], MD for neuropathy. Hard impact activities worsen sx. Not sleeping well. Sx started about 2-2.5 years ago. Started with his feet, then hands. At times, pain all over body. Right shoulder pain started in last 8-9 months ago. This is intermittent. He is on Lyrica, this sometimes helps. Has to take hydrocodone to help with pain sometimes. Cancer doctor told him about a year ago he does not have polycythemia vera. He thought he had this for years. Here to r/o other causes for his sx.    Objective:  Neurological Exam  Physical Exam Physical Examination:   Vitals:   02/20/20 1445  BP: (!) 146/91  Pulse: 72    General Examination: The patient is a very pleasant 59 y.o. male in no acute distress. He appears well-developed and well-nourished and well groomed.   HEENT: Normocephalic, atraumatic, pupils are equal, round and reactive to light and accommodation. Extraocular tracking is good without limitation to gaze excursion or nystagmus noted. Normal smooth pursuit is noted. Hearing is grossly intact. Face is symmetric with normal facial animation and normal facial sensation. Speech is clear with no dysarthria noted. There is no hypophonia. There is no lip, neck/head, jaw or voice tremor. Neck is supple with full range of passive and active motion.  There are no carotid bruits on auscultation. Oropharynx exam reveals: mild mouth dryness, adequate dental hygien. Tongue protrudes centrally and palate elevates symmetrically.   Chest: Clear to auscultation without wheezing, rhonchi or crackles noted.  Heart: S1+S2+0, regular and normal without murmurs, rubs or gallops noted.   Abdomen: Soft, non-tender and non-distended with normal bowel sounds appreciated on auscultation.  Extremities: There is no pitting edema in the distal lower extremities bilaterally. Pedal pulses are intact.  Skin: Warm and dry without trophic changes noted. There are no varicose veins.  Musculoskeletal: exam reveals some discomfort in the right shoulder, scar left knee from a prior injury.    Neurologically:  Mental status: The patient is awake, alert and oriented in all 4 spheres. His immediate and remote memory, attention, language skills and fund of knowledge are appropriate. There is no evidence of aphasia, agnosia, apraxia or anomia. Speech is clear with normal prosody and enunciation. Thought process is linear. Mood is normal and affect is normal.  Cranial nerves II - XII are as described above under HEENT exam. In addition: shoulder shrug is normal with equal shoulder height noted. Motor exam: Normal bulk, strength and tone is noted. There is no drift, tremor or  rebound. Reflexes are 2-3+ throughout. Babinski: Toes are flexor bilaterally. Fine motor skills and coordination: intact with normal finger taps, normal hand movements, normal rapid alternating patting, normal foot taps and normal foot agility.  Cerebellar testing: No dysmetria or intention tremor on finger to nose testing. Heel to shin is unremarkable bilaterally. There is no truncal or gait ataxia.  Sensory exam: intact to light touch, vibration, temperature sense in the upper extremities, with the exception of decreased pinprick sensation in the hands but slightly hypersensitive in the fingertips, also  decreased sensation to temperature and pinprick up to mid calf areas bilaterally in the lower extremities.  Gait, station and balance: He stands easily. No veering to one side is noted. No leaning to one side is noted. Posture is age-appropriate and stance is narrow based. Gait shows normal stride length and normal pace. No problems turning are noted. I did not ask him to do a Romberg test or tandem walk because by the end of our visit we had a power outage and due to limited light in the room I did not feel comfortable asking him to do these 2 tests.  Assessment and Plan:   In summary, VIKASH [name] is a very pleasant 59 y.o.-year old male with an underlying medical history of hyperlipidemia, diabetes, coronary artery disease, anemia, chronic pancreatitis, polycythemia, and Left shoulder bursitis, who presents for evaluation of his paresthesias affecting the lower extremities for the past 2-1/2 years in the upper extremities for the past year or so. 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 He has strong family history of diabetes as well. He is encouraged to talk to his primary care physician about potentially seeing a endocrinologist. We will proceed with additional blood work to rule out treatable causes. I was not able to enter his blood tests or any other order by the end of his visit unfortunately secondary to power outage. The patient was very understanding and would be agreeable to coming back for his blood draw. He is scheduled for cervical spine MRI which is advisable from my end of things. Interestingly, he did have some degree of hyperreflexia, no other upper motor neuron type signs, does report intermittent shooting pains into his trunk, all even lower body and sometimes across the chest, no facial symptoms, an MRI of the brain is therefore not recommended at this time from my  end of things. I would like to proceed with an EMG nerve conduction velocity test through our office. We will call him to schedule this as I was not able to have him schedule this by the time he left. I plan to see him back after testing, we will also keep him posted as to his blood test results and EMG results. He had been on gabapentin for symptomatic treatment per PCP up to a dose of 1200 mg daily. He has now been on Lyrica for the past 3 months or so. He he can continue with the Lyrica for now. I answered all his questions today and the patient was in agreement and very understanding about our technical difficulties at the end of the visit. Thankfully, we had completed our history and examination almost entirely by the time we had the power outage.  Thank you very much for allowing me to participate in the care of this nice patient. If I can be of any further assistance to you please do not hesitate  to call me at [phone].  Sincerely,   Star Age, MD, PhD

## 2020-02-21 ENCOUNTER — Encounter: Payer: Self-pay | Admitting: Neurology

## 2020-02-21 ENCOUNTER — Telehealth: Payer: Self-pay | Admitting: Neurology

## 2020-02-21 NOTE — Telephone Encounter (Signed)
To check his B12.

## 2020-02-21 NOTE — Telephone Encounter (Signed)
Patient called asking if he needs to get blood work done.

## 2020-02-21 NOTE — Telephone Encounter (Signed)
I called the pt and we discussed him having labs order by Dr. Rexene Alberts along with NCS/EMG.. Pt is scheduled for next avail NCS/EMG in Aug and is planning on having labs drawn on 02/26/2020.

## 2020-02-21 NOTE — Patient Instructions (Signed)
You may have a condition called peripheral neuropathy, i. e. nerve damage. There is no specific treatment for most neuropathies. The most common cause for neuropathy is diabetes in this [place], in which case, tight glucose control is key. Please talk to your primary care physician about the option of seeing an endocrinologist/diabetes specialist.  Other causes include thyroid disease, and some vitamin deficiencies. Certain medications such as chemotherapy agents and other chemicals or toxins including alcohol can cause neuropathy. There are some genetic conditions or hereditary neuropathies. Typically patients will report a family history of neuropathy in those conditions. There are cases associated with cancers and autoimmune conditions. Most neuropathies are progressive unless a root cause can be found and treated. For most neuropathies there is no actual cure or reversing of symptoms. Painful neuropathy can be difficult to treat symptomatically, but there are some medications available to ease the symptoms. Electrophysiologic testing with nerve conduction velocity studies and EMG (muscle testing) do not always pick up neuropathies that affect the smallest fibers. Other common tests include different type of blood work, and rarely, spinal fluid testing, and sometimes we resort to asking for a nerve and muscle biopsy.  As discussed, we will proceed with blood work through our office. We will also schedule you for an EMG nerve conduction velocity test, my nurse will reach out to you as we were not able to put the orders in yesterday. Thank you very much for being so understanding.

## 2020-02-26 DIAGNOSIS — M50122 Cervical disc disorder at C5-C6 level with radiculopathy: Secondary | ICD-10-CM | POA: Diagnosis not present

## 2020-02-26 DIAGNOSIS — M5011 Cervical disc disorder with radiculopathy,  high cervical region: Secondary | ICD-10-CM | POA: Diagnosis not present

## 2020-02-26 DIAGNOSIS — M9971 Connective tissue and disc stenosis of intervertebral foramina of cervical region: Secondary | ICD-10-CM | POA: Diagnosis not present

## 2020-02-26 DIAGNOSIS — M50121 Cervical disc disorder at C4-C5 level with radiculopathy: Secondary | ICD-10-CM | POA: Diagnosis not present

## 2020-02-26 DIAGNOSIS — M4802 Spinal stenosis, cervical region: Secondary | ICD-10-CM | POA: Diagnosis not present

## 2020-02-27 DIAGNOSIS — Z7189 Other specified counseling: Secondary | ICD-10-CM | POA: Diagnosis not present

## 2020-02-27 DIAGNOSIS — N410 Acute prostatitis: Secondary | ICD-10-CM | POA: Diagnosis not present

## 2020-02-27 DIAGNOSIS — M25511 Pain in right shoulder: Secondary | ICD-10-CM | POA: Diagnosis not present

## 2020-02-27 DIAGNOSIS — E1142 Type 2 diabetes mellitus with diabetic polyneuropathy: Secondary | ICD-10-CM | POA: Diagnosis not present

## 2020-03-26 NOTE — Progress Notes (Signed)
Cardiology Office Note   Date:  03/27/2020   ID:  [name], [name] [birth year], MRN [id number]  PCP:  Lemmie Evens, MD  Cardiologist:   Minus Breeding, MD   Chief Complaint  Patient presents with  . Peripheral Neuropathy      History of Present Illness: [name] is a 59 y.o. male who presents for followup of his known coronary disease.  In 2016 he had chest pain but stress perfusion study demonstrated an EF of 60% with no ischemia.  He had an echo in Jan last year.  This was normal.    Previously he had a severe episode of chest discomfort which is why wanted to follow him up now.  He had an episode where he had to take 5 nitroglycerin 1 day.  It was somewhat atypical.  He otherwise was not have any symptoms and he was able to be active such as walking to mailbox, pulling trash cans and doing push-ups.  He now has no cardiovascular complaints.  Is not having any new chest discomfort, neck or arm discomfort.  He says he might take a nitroglycerin about twice a month which is a stable or reduced pattern for him.  His issues are really centering around neuropathy.  He also has intermittent abdominal complaints.  He has poorly controlled diabetes   Past Medical History:  Diagnosis Date  . Anemia   . 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 Chronic gastritis   . Coronary artery disease    Patent Stent to Circ.  Patent coronary bypass grafts with a free radial graft to PDA and LIMA to the  diag.  . Diabetes mellitus   . Dyslipidemia    Poorly controlled, probably related to his DM (Some of this is secondary to his inability to afford medications).  . ED (erectile dysfunction)   . Esophageal yeast infection ([name])   . Metaplasia of esophagus   . Pancreatitis chronic    Pseudocyst  . Polycythemia vera(238.4) 04/26/2012    Past Surgical History:  Procedure Laterality Date  . APPENDECTOMY    . CARDIAC CATHETERIZATION    . CHOLECYSTECTOMY    . COLONOSCOPY  07/31/1985  .  COLONOSCOPY N/A 03/11/2016   Procedure: COLONOSCOPY;  Surgeon: Rogene Houston, MD;  Location: [facility];  Service: Endoscopy;  Laterality: N/A;  . CORONARY ANGIOPLASTY WITH STENT PLACEMENT  2006  . CORONARY ARTERY BYPASS GRAFT  11/24/2004  . ESOPHAGOGASTRODUODENOSCOPY  09/19/01  . ESOPHAGOGASTRODUODENOSCOPY N/A 03/11/2016   Procedure: ESOPHAGOGASTRODUODENOSCOPY (EGD);  Surgeon: Rogene Houston, MD;  Location: [facility];  Service: Endoscopy;  Laterality: N/A;  12:00  . FINGER TENDON REPAIR     in middle finger  . KNEE SURGERY     left knee  . NASAL SEPTUM SURGERY       Current Outpatient Medications  Medication Sig Dispense Refill  . Cholecalciferol (VITAMIN D-1000 MAX ST) 1000 units tablet Take 4,000 Units by mouth daily.    . clopidogrel (PLAVIX) 75 MG tablet Take 1 tablet (75 mg total) by mouth daily.    . diazepam (VALIUM) 10 MG tablet Take 10 mg by mouth at bedtime as needed for anxiety or sleep. As Needed    . glipiZIDE (GLUCOTROL) 10 MG tablet Take 10 mg by mouth 2 (two) times daily.     . hydrochlorothiazide (HYDRODIURIL) 12.5 MG tablet Take 1 tablet by mouth daily.    Marland Kitchen HYDROcodone-acetaminophen (NORCO) 10-325 MG tablet  Take 1 tablet every 4 (four) hours as needed by mouth for severe pain.     Marland Kitchen insulin NPH-regular Human (NOVOLIN 70/30) (70-30) 100 UNIT/ML injection Inject 49 Units into the skin. Before bedtime.    . lansoprazole (PREVACID) 30 MG capsule Take 1 capsule (30 mg total) by mouth 2 (two) times daily before a meal. 60 capsule 3  . metFORMIN (GLUCOPHAGE) 1000 MG tablet Take 1,000 mg by mouth 2 (two) times daily with a meal.     . metoprolol tartrate (LOPRESSOR) 100 MG tablet Take 25 mg by mouth 2 (two) times daily.     . nitroGLYCERIN (NITROSTAT) 0.4 MG SL tablet Place 0.4 mg under the tongue every 5 (five) minutes as needed for chest pain. Reported on 02/26/2016    . NON FORMULARY Domperidone 10 mg Patient takes 2 by mouth daily    . omega-3 acid ethyl esters  (LOVAZA) 1 g capsule Take 2 capsules (2 g total) by mouth 2 (two) times daily. 360 capsule 2  . ondansetron (ZOFRAN) 4 MG tablet Take 4 mg by mouth every 8 (eight) hours as needed for nausea or vomiting.    . Pancrelipase, Lip-Prot-Amyl, 6000 units CPEP Take by mouth as needed.     Marland Kitchen PARoxetine (PAXIL) 10 MG tablet Take 10 mg by mouth daily.      . pregabalin (LYRICA) 150 MG capsule Take 150 mg by mouth 2 (two) times daily.     . promethazine (PHENERGAN) 25 MG tablet Take 25 mg by mouth every 8 (eight) hours as needed for nausea or vomiting.     . sildenafil (VIAGRA) 50 MG tablet Take 1 tablet (50 mg total) by mouth daily as needed for erectile dysfunction. 10 tablet 3  . tetrahydrozoline 0.05 % ophthalmic solution Apply to eye.     No current facility-administered medications for this visit.    Allergies:   Metoclopramide hcl, Enalapril, Iodinated diagnostic agents, Iohexol, Propofol, Sulfonamide derivatives, Penicillins, and Sulfa antibiotics    ROS:  Please see the history of present illness.   Otherwise, review of systems are positive for none   All other systems are reviewed and negative.    PHYSICAL EXAM: VS:  BP 120/77   Pulse 66   Temp (!) 96.8 F (36 C)   Ht 5' 9.5" (1.765 m)   Wt 164 lb 6.4 oz (74.6 kg)   SpO2 98%   BMI 23.93 kg/m  , BMI Body mass index is 23.93 kg/m. GENERAL:  Well appearing NECK:  No jugular venous distention, waveform within normal limits, carotid upstroke brisk and symmetric, no bruits, no thyromegaly LUNGS:  Clear to auscultation bilaterally CHEST:  Unremarkable HEART:  PMI not displaced or sustained,S1 and S2 within normal limits, no S3, no S4, no clicks, no rubs, no murmurs ABD:  Flat, positive bowel sounds normal in frequency in pitch, no bruits, no rebound, no guarding, no midline pulsatile mass, no hepatomegaly, no splenomegaly EXT:  2 plus pulses throughout, no edema, no cyanosis no clubbing   EKG:  EKG is not ordered today.    Recent  Labs: No results found for requested labs within last 8760 hours.      Wt Readings from Last 3 Encounters:  03/27/20 164 lb 6.4 oz (74.6 kg)  02/20/20 165 lb (74.8 kg)  02/06/20 168 lb 6.4 oz (76.4 kg)      Other studies Reviewed: Additional studies/ records that were reviewed today include: None Review of the above records demonstrates:  .  ASSESSMENT AND PLAN:   CAD:  The patient has no new sypmtoms.  No further cardiovascular testing is indicated.  We will continue with aggressive risk reduction and meds as listed.  He does not tolerate a lot in the way of medicines.  He unfortunately is at high risk because of his ongoing problems with lipids and diabetes but his medication intolerant.  At this point he is not having any unstable symptoms and he will continue with the meds as listed.  HTN:   The blood pressure is at target.  No change in therapy.   HYPERLIPIDEMIA:    He has been intolerant of Repatha and statins.  I have suggested an endocrinologist for follow-up.   DM: His last A1C was he says his A1c is 10.6 per his report.  This is down from previous and I again have suggested an endocrinologist.   COVID EDUCATION:   He does not want the vaccine.  Current medicines are reviewed at length with the patient today.  The patient does not have concerns regarding medicines.  The following changes have been made:  no change  Labs/ tests ordered today include: None No orders of the defined types were placed in this encounter.    Disposition:   FU with me in six months or sooner if needed.     Signed, Minus Breeding, MD  03/27/2020 12:57 PM    [place]

## 2020-03-27 ENCOUNTER — Encounter: Payer: Self-pay | Admitting: Cardiology

## 2020-03-27 ENCOUNTER — Other Ambulatory Visit: Payer: Self-pay

## 2020-03-27 ENCOUNTER — Ambulatory Visit: Payer: PPO | Admitting: Cardiology

## 2020-03-27 VITALS — BP 120/77 | HR 66 | Temp 96.8°F | Ht 69.5 in | Wt 164.4 lb

## 2020-03-27 DIAGNOSIS — Z794 Long term (current) use of insulin: Secondary | ICD-10-CM | POA: Diagnosis not present

## 2020-03-27 DIAGNOSIS — Z7189 Other specified counseling: Secondary | ICD-10-CM | POA: Diagnosis not present

## 2020-03-27 DIAGNOSIS — I2510 Atherosclerotic heart disease of native coronary artery without angina pectoris: Secondary | ICD-10-CM

## 2020-03-27 DIAGNOSIS — E785 Hyperlipidemia, unspecified: Secondary | ICD-10-CM

## 2020-03-27 DIAGNOSIS — I10 Essential (primary) hypertension: Secondary | ICD-10-CM

## 2020-03-27 DIAGNOSIS — E118 Type 2 diabetes mellitus with unspecified complications: Secondary | ICD-10-CM | POA: Diagnosis not present

## 2020-03-27 NOTE — Patient Instructions (Signed)
Medication Instructions:  Continue same medications *If you need a refill on your cardiac medications before your next appointment, please call your pharmacy*   Lab Work: None ordered   Testing/Procedures: None ordered   Follow-Up: At [facility], you and your health needs are our priority.  As part of our continuing mission to provide you with exceptional heart care, we have created designated Provider Care Teams.  These Care Teams include your primary Cardiologist (physician) and Advanced Practice Providers (APPs -  Physician Assistants and Nurse Practitioners) who all work together to provide you with the care you need, when you need it.  We recommend signing up for the patient portal called "MyChart".  Sign up information is provided on this After Visit Summary.  MyChart is used to connect with patients for Virtual Visits (Telemedicine).  Patients are able to view lab/test results, encounter notes, upcoming appointments, etc.  Non-urgent messages can be sent to your provider as well.   To learn more about what you can do with MyChart, go to [email or web address].    Your next appointment:  1 year    Call in May to schedule August appointment    The format for your next appointment: Office    Provider: Dr.Hochrein

## 2020-04-02 DIAGNOSIS — N410 Acute prostatitis: Secondary | ICD-10-CM | POA: Diagnosis not present

## 2020-04-02 DIAGNOSIS — E781 Pure hyperglyceridemia: Secondary | ICD-10-CM | POA: Diagnosis not present

## 2020-04-02 DIAGNOSIS — E1142 Type 2 diabetes mellitus with diabetic polyneuropathy: Secondary | ICD-10-CM | POA: Diagnosis not present

## 2020-04-02 DIAGNOSIS — Z7189 Other specified counseling: Secondary | ICD-10-CM | POA: Diagnosis not present

## 2020-04-07 ENCOUNTER — Telehealth: Payer: Self-pay | Admitting: Neurology

## 2020-04-07 NOTE — Telephone Encounter (Signed)
Pt want to know if he can come in early before his Nerve Conduction to have blood work done. Pt would like a call back

## 2020-04-07 NOTE — Telephone Encounter (Signed)
I called the pt and advised it would be ok to complete labs the same day as  the nerve conduction. Pt had no further questions/concerns at this time.

## 2020-04-09 DIAGNOSIS — G629 Polyneuropathy, unspecified: Secondary | ICD-10-CM | POA: Diagnosis not present

## 2020-04-09 DIAGNOSIS — D45 Polycythemia vera: Secondary | ICD-10-CM | POA: Diagnosis not present

## 2020-04-09 DIAGNOSIS — F419 Anxiety disorder, unspecified: Secondary | ICD-10-CM | POA: Diagnosis not present

## 2020-04-09 DIAGNOSIS — I7381 Erythromelalgia: Secondary | ICD-10-CM | POA: Diagnosis not present

## 2020-04-09 DIAGNOSIS — D751 Secondary polycythemia: Secondary | ICD-10-CM | POA: Diagnosis not present

## 2020-04-10 ENCOUNTER — Encounter: Payer: PPO | Admitting: Diagnostic Neuroimaging

## 2020-04-24 DIAGNOSIS — I7381 Erythromelalgia: Secondary | ICD-10-CM | POA: Diagnosis not present

## 2020-04-24 DIAGNOSIS — D45 Polycythemia vera: Secondary | ICD-10-CM | POA: Diagnosis not present

## 2020-04-24 DIAGNOSIS — G8929 Other chronic pain: Secondary | ICD-10-CM | POA: Insufficient documentation

## 2020-04-24 DIAGNOSIS — G894 Chronic pain syndrome: Secondary | ICD-10-CM | POA: Diagnosis not present

## 2020-04-24 DIAGNOSIS — D751 Secondary polycythemia: Secondary | ICD-10-CM | POA: Diagnosis not present

## 2020-04-24 DIAGNOSIS — G629 Polyneuropathy, unspecified: Secondary | ICD-10-CM | POA: Diagnosis not present

## 2020-04-24 DIAGNOSIS — F419 Anxiety disorder, unspecified: Secondary | ICD-10-CM | POA: Diagnosis not present

## 2020-05-06 ENCOUNTER — Ambulatory Visit: Payer: PPO | Admitting: Neurology

## 2020-05-06 ENCOUNTER — Encounter: Payer: Self-pay | Admitting: Neurology

## 2020-05-06 ENCOUNTER — Ambulatory Visit (INDEPENDENT_AMBULATORY_CARE_PROVIDER_SITE_OTHER): Payer: PPO | Admitting: Neurology

## 2020-05-06 DIAGNOSIS — E1142 Type 2 diabetes mellitus with diabetic polyneuropathy: Secondary | ICD-10-CM

## 2020-05-06 DIAGNOSIS — M4802 Spinal stenosis, cervical region: Secondary | ICD-10-CM

## 2020-05-06 DIAGNOSIS — R202 Paresthesia of skin: Secondary | ICD-10-CM

## 2020-05-06 DIAGNOSIS — E0842 Diabetes mellitus due to underlying condition with diabetic polyneuropathy: Secondary | ICD-10-CM

## 2020-05-06 HISTORY — DX: Type 2 diabetes mellitus with diabetic polyneuropathy: E11.42

## 2020-05-06 HISTORY — DX: Spinal stenosis, cervical region: M48.02

## 2020-05-06 NOTE — Progress Notes (Signed)
   MRI cervical 02/26/20:  IMPRESSION:  1. 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  2. Moderate left C3-4 neural foraminal stenosis.  3. Mild bilateral C4-5 neural foraminal stenosis.

## 2020-05-06 NOTE — Progress Notes (Signed)
Please refer to EMG and nerve conduction procedure note.  

## 2020-05-06 NOTE — Procedures (Signed)
     HISTORY:  Damarea Merkel is a 59 year old gentleman with history of diabetes who has noted some problems with tingling sensations in the hands and feet over the last 2 and half years. The tingling in the hands is very intermittent, the sensation changes in the feet are more persistent. He does have some shoulder discomfort, a recent MRI of the cervical spine shows severe spinal stenosis at the C5-6 level.  NERVE CONDUCTION STUDIES:  Nerve conduction studies were performed on the right upper extremity. The distal motor latencies and motor amplitudes for the median and ulnar nerves were within normal limits. The nerve conduction velocities for these nerves were also normal. The sensory latencies for the median and ulnar nerves were normal. The F wave latency for the ulnar nerve was within normal limits.   Nerve conduction studies were performed on the right lower extremity. The distal motor latencies and motor amplitudes for the peroneal and posterior tibial nerves were within normal limits with slowing of these nerves seen. The sural and peroneal sensory latencies were unobtainable. The F-wave latency for the right posterior tibial nerve was within normal limits.  EMG STUDIES:  EMG study was performed on the right lower extremity:  The tibialis anterior muscle reveals 2 to 4K motor units with full recruitment. No fibrillations or positive waves were seen. The peroneus tertius muscle reveals 2 to 4K motor units with slightly reduced recruitment. No fibrillations or positive waves were seen. The medial gastrocnemius muscle reveals 1 to 3K motor units with full recruitment. No fibrillations or positive waves were seen. The vastus lateralis muscle reveals 2 to 4K motor units with full recruitment. No fibrillations or positive waves were seen. The iliopsoas muscle reveals 2 to 4K motor units with full recruitment. No fibrillations or positive waves were seen. The biceps femoris muscle (long head)  reveals 2 to 4K motor units with full recruitment. No fibrillations or positive waves were seen. The lumbosacral paraspinal muscles were tested at 3 levels, and revealed no abnormalities of insertional activity at all 3 levels tested. There was good relaxation.   IMPRESSION:  Nerve conduction studies done on the right upper and right lower extremities shows evidence of a primarily axonal peripheral neuropathy of moderate severity, possibly related to the diagnosis of diabetes. The EMG evaluation of the right lower extremity was relatively unremarkable without evidence of an overlying lumbosacral radiculopathy.  Jill Alexanders MD 05/06/2020 4:14 PM  [facility] [address] [place], [place] [address]  Phone [phone] Fax [phone]

## 2020-05-07 NOTE — Progress Notes (Addendum)
Please call patient and advise him that his EMG nerve conduction velocity test did show evidence of neuropathy which is nerve damage, in the moderate degree, probably related to underlying diabetes.  I had entered some blood tests which he was supposed to have drawn, can you find out if he had the blood test done?       [place]    Nerve / Sites Muscle Latency Ref. Amplitude Ref. Rel Amp Segments Distance Velocity Ref. Area    ms ms mV mV %  cm m/s m/s mVms  R Median - APB     Wrist APB 4.0 ?4.4 9.7 ?4.0 100 Wrist - APB 7   36.6     Upper arm APB 8.4  9.1  94 Upper arm - Wrist 24 54 ?49 35.5  R Ulnar - ADM     Wrist ADM 3.0 ?3.3 9.3 ?6.0 100 Wrist - ADM 7   29.1     B.Elbow ADM 7.3  8.8  93.9 B.Elbow - Wrist 22 52 ?49 28.8     A.Elbow ADM 9.3  8.4  95.7 A.Elbow - B.Elbow 10 49 ?49 32.4  R Peroneal - EDB     Ankle EDB 6.4 ?6.5 2.1 ?2.0 100 Ankle - EDB 9   9.9     Fib head EDB 13.5  1.9  89.6 Fib head - Ankle 28 39 ?44 9.1     Pop fossa EDB 16.2  1.9  101 Pop fossa - Fib head 10 38 ?44 7.6         Pop fossa - Ankle      R Tibial - AH     Ankle AH 3.7 ?5.8 4.1 ?4.0 100 Ankle - AH 9   13.3     Pop fossa AH 13.6  3.5  86.5 Pop fossa - Ankle 38 38 ?41 14.1             SNC    Nerve / Sites Rec. Site Peak Lat Ref.  Amp Ref. Segments Distance    ms ms V V  cm  R Sural - Ankle (Calf)     Calf Ankle NR ?4.4 NR ?6 Calf - Ankle 14  R Superficial peroneal - Ankle     Lat leg Ankle NR ?4.4 NR ?6 Lat leg - Ankle 14  R Median - Orthodromic (Dig II, Mid palm)     Dig II Wrist 3.4 ?3.4 12 ?10 Dig II - Wrist 13  R Ulnar - Orthodromic, (Dig V, Mid palm)     Dig V Wrist 3.1 ?3.1 6 ?5 Dig V - Wrist 36             F  Wave    Nerve F Lat Ref.   ms ms  R Tibial - AH 55.7 ?56.0  R Ulnar - ADM 31.3 ?32.0

## 2020-05-08 ENCOUNTER — Telehealth: Payer: Self-pay

## 2020-05-08 NOTE — Telephone Encounter (Signed)
-----   Message from Star Age, MD sent at 05/07/2020  5:25 PM EDT ----- Please call patient and advise him that his EMG nerve conduction velocity test did show evidence of neuropathy which is nerve damage, in the moderate degree, probably related to underlying diabetes.  I had entered some blood tests which he was supposed to have drawn, can you find out if he had the blood test done?

## 2020-05-08 NOTE — Telephone Encounter (Signed)
I reached out to the pt and we discussed the results of his NCS/EMG. Pt verbalized understanding.  I spoke with the pt on 04/07/2020 and he was to have his blood test drawn on 05/06/2020 at the time of his nerve study. Pt sts he told the front desk about the labs needing to be completed but was never taken to the lab.   Pt was advised we would still like for him to have these blood tests completed and he will plan to come the first of next week to complete.

## 2020-05-12 ENCOUNTER — Ambulatory Visit: Payer: PPO | Admitting: "Endocrinology

## 2020-05-19 ENCOUNTER — Other Ambulatory Visit (INDEPENDENT_AMBULATORY_CARE_PROVIDER_SITE_OTHER): Payer: Self-pay

## 2020-05-19 DIAGNOSIS — R202 Paresthesia of skin: Secondary | ICD-10-CM | POA: Diagnosis not present

## 2020-05-19 DIAGNOSIS — E0842 Diabetes mellitus due to underlying condition with diabetic polyneuropathy: Secondary | ICD-10-CM | POA: Diagnosis not present

## 2020-05-19 DIAGNOSIS — Z0289 Encounter for other administrative examinations: Secondary | ICD-10-CM

## 2020-05-24 LAB — HEAVY METALS PROFILE II, BLOOD
Arsenic: <1 ug/L — ABNORMAL LOW (ref 2–23)
Cadmium: 0.7 ug/L (ref 0.0–1.2)
Lead, Blood: <1 ug/dL (ref 0–4)
Mercury: 1.4 ug/L (ref 0.0–14.9)

## 2020-05-24 LAB — MULTIPLE MYELOMA PANEL, SERUM
Albumin SerPl Elph-Mcnc: 3.9 g/dL (ref 2.9–4.4)
Albumin/Glob SerPl: 1.4 (ref 0.7–1.7)
Alpha 1: 0.2 g/dL (ref 0.0–0.4)
Alpha2 Glob SerPl Elph-Mcnc: 0.9 g/dL (ref 0.4–1.0)
B-Globulin SerPl Elph-Mcnc: 1.1 g/dL (ref 0.7–1.3)
Gamma Glob SerPl Elph-Mcnc: 0.8 g/dL (ref 0.4–1.8)
Globulin, Total: 2.9 g/dL (ref 2.2–3.9)
IgA/Immunoglobulin A, Serum: 298 mg/dL (ref 90–386)
IgG (Immunoglobin G), Serum: 685 mg/dL (ref 603–1613)
IgM (Immunoglobulin M), Srm: 59 mg/dL (ref 20–172)
Total Protein: 6.8 g/dL (ref 6.0–8.5)

## 2020-05-24 LAB — RHEUMATOID FACTOR: Rheumatoid fact SerPl-aCnc: <10 IU/mL (ref 0.0–13.9)

## 2020-05-24 LAB — SEDIMENTATION RATE: Sed Rate: 9 mm/hr (ref 0–30)

## 2020-05-24 LAB — B12 AND FOLATE PANEL
Folate: 7.9 ng/mL (ref >3.0)
Vitamin B-12: 598 pg/mL (ref 232–1245)

## 2020-05-24 LAB — HGB A1C W/O EAG: Hgb A1c MFr Bld: 8.6 % — ABNORMAL HIGH (ref 4.8–5.6)

## 2020-05-24 LAB — C-REACTIVE PROTEIN: CRP: 1 mg/L (ref 0–10)

## 2020-05-24 LAB — ANA W/REFLEX: Anti Nuclear Antibody (ANA): NEGATIVE

## 2020-05-24 LAB — TSH: TSH: 2.57 u[IU]/mL (ref 0.450–4.500)

## 2020-05-24 LAB — VITAMIN B6: Vitamin B6: 105.9 ug/L — ABNORMAL HIGH (ref 5.3–46.7)

## 2020-05-24 LAB — VITAMIN B1: Thiamine: 124.1 nmol/L (ref 66.5–200.0)

## 2020-05-26 ENCOUNTER — Telehealth: Payer: Self-pay | Admitting: *Deleted

## 2020-05-26 NOTE — Telephone Encounter (Signed)
I spoke to the patient who verbalized understanding of the findings. He will continue working on better control of his diabetes. He is taking an enzyme that has B6 in it and will discuss this with his GI physician. He declined to schedule a three month follow up while we were on the phone. He plans to call back to make the appointment.

## 2020-05-26 NOTE — Progress Notes (Signed)
Please call patient and advise him that his blood work showed an elevated hemoglobin A1c at 8.6.  I believe this is improving from what I documented as his latest A1c before.  Please advise him to continue to work on optimizing his diabetes as this will help reduce the likelihood for progression of his neuropathy.  Also, his vitamin B6 was elevated.  The normal range is between 5.3 and 46.7.  His level was 105.9.  Sometimes taking over-the-counter supplements can elevate the vitamin levels.  If he is taking over-the-counter vitamin B6 as a supplement, I would recommend that he hold off on it for now.  Sometimes taking too much of a certain vitamin can also cause problems.  Other labs were benign.  Please offer a follow-up appointment for routine checkup in 3 months.

## 2020-05-26 NOTE — Telephone Encounter (Signed)
-----   Message from Star Age, MD sent at 05/26/2020  7:09 AM EDT ----- Please call patient and advise him that his blood work showed an elevated hemoglobin A1c at 8.6.  I believe this is improving from what I documented as his latest A1c before.  Please advise him to continue to work on optimizing his diabetes as this will help reduce the likelihood for progression of his neuropathy.  Also, his vitamin B6 was elevated.  The normal range is between 5.3 and 46.7.  His level was 105.9.  Sometimes taking over-the-counter supplements can elevate the vitamin levels.  If he is taking over-the-counter vitamin B6 as a supplement, I would recommend that he hold off on it for now.  Sometimes taking too much of a certain vitamin can also cause problems.  Other labs were benign.  Please offer a follow-up appointment for routine checkup in 3 months.

## 2020-05-29 ENCOUNTER — Encounter: Payer: Self-pay | Admitting: "Endocrinology

## 2020-05-29 ENCOUNTER — Other Ambulatory Visit: Payer: Self-pay

## 2020-05-29 ENCOUNTER — Ambulatory Visit (INDEPENDENT_AMBULATORY_CARE_PROVIDER_SITE_OTHER): Payer: PPO | Admitting: "Endocrinology

## 2020-05-29 VITALS — BP 124/71 | HR 74 | Ht 69.5 in | Wt 164.0 lb

## 2020-05-29 DIAGNOSIS — Z789 Other specified health status: Secondary | ICD-10-CM | POA: Diagnosis not present

## 2020-05-29 DIAGNOSIS — E1110 Type 2 diabetes mellitus with ketoacidosis without coma: Secondary | ICD-10-CM | POA: Diagnosis not present

## 2020-05-29 DIAGNOSIS — K8681 Exocrine pancreatic insufficiency: Secondary | ICD-10-CM | POA: Diagnosis not present

## 2020-05-29 DIAGNOSIS — E0865 Diabetes mellitus due to underlying condition with hyperglycemia: Secondary | ICD-10-CM

## 2020-05-29 DIAGNOSIS — K8689 Other specified diseases of pancreas: Secondary | ICD-10-CM | POA: Diagnosis not present

## 2020-05-29 DIAGNOSIS — E782 Mixed hyperlipidemia: Secondary | ICD-10-CM

## 2020-05-29 DIAGNOSIS — I10 Essential (primary) hypertension: Secondary | ICD-10-CM

## 2020-05-29 DIAGNOSIS — IMO0002 Reserved for concepts with insufficient information to code with codable children: Secondary | ICD-10-CM

## 2020-05-29 DIAGNOSIS — M609 Myositis, unspecified: Secondary | ICD-10-CM | POA: Insufficient documentation

## 2020-05-29 LAB — POCT GLYCOSYLATED HEMOGLOBIN (HGB A1C): Hemoglobin A1C: 7.9 % — AB (ref 4.0–5.6)

## 2020-05-29 MED ORDER — METFORMIN HCL ER 500 MG PO TB24: 500.0000 mg | ORAL_TABLET | Freq: Every day | ORAL | 3 refills | Status: DC

## 2020-05-29 NOTE — Patient Instructions (Signed)

## 2020-05-29 NOTE — Progress Notes (Signed)
Endocrinology Consult Note       05/29/2020, 6:01 PM   Subjective:    Patient ID: [name], male    DOB: April 16, 1961.  [name] is being seen in consultation for management of currently uncontrolled symptomatic diabetes requested by  Lemmie Evens, MD.   Past Medical History:  Diagnosis Date  . Anemia   . 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 Cervical spinal stenosis 05/06/2020   C5-6 level  . Chronic gastritis   . Coronary artery disease    Patent Stent to Circ.  Patent coronary bypass grafts with a free radial graft to PDA and LIMA to the  diag.  . Diabetes mellitus   . Diabetic peripheral neuropathy ([place]) 05/06/2020  . Dyslipidemia    Poorly controlled, probably related to his DM (Some of this is secondary to his inability to afford medications).  . ED (erectile dysfunction)   . Esophageal yeast infection ([place])   . Metaplasia of esophagus   . Pancreatitis chronic    Pseudocyst  . Polycythemia vera(238.4) 04/26/2012    Past Surgical History:  Procedure Laterality Date  . APPENDECTOMY    . CARDIAC CATHETERIZATION    . CHOLECYSTECTOMY    . COLONOSCOPY  07/31/1985  . COLONOSCOPY N/A 03/11/2016   Procedure: COLONOSCOPY;  Surgeon: Rogene Houston, MD;  Location: [facility];  Service: Endoscopy;  Laterality: N/A;  . CORONARY ANGIOPLASTY WITH STENT PLACEMENT  2006  . CORONARY ARTERY BYPASS GRAFT  11/24/2004  . ESOPHAGOGASTRODUODENOSCOPY  09/19/01  . ESOPHAGOGASTRODUODENOSCOPY N/A 03/11/2016   Procedure: ESOPHAGOGASTRODUODENOSCOPY (EGD);  Surgeon: Rogene Houston, MD;  Location: [facility];  Service: Endoscopy;  Laterality: N/A;  12:00  . FINGER TENDON REPAIR     in middle finger  . KNEE SURGERY     left knee  . NASAL SEPTUM SURGERY      Social History   Socioeconomic History  . Marital status: Married    Spouse name: Not on file  . Number of children: Not on file  .  Years of education: Not on file  . Highest education level: Not on file  Occupational History  . Not on file  Tobacco Use  . Smoking status: Never Smoker  . Smokeless tobacco: Never Used  Substance and Sexual Activity  . Alcohol use: Yes    Comment: Patient may drink a beer or glass of wine a couple times a month  . Drug use: No  . Sexual activity: Not on file  Other Topics Concern  . Not on file  Social History Narrative   The patient lives in [place] with his wife.  He is     disabled secondary to coronary artery disease and pancreatitis.  He does     not smoke, he does not drink alcohol.  There is no drug use.    Social Determinants of Health   Financial Resource Strain:   . Difficulty of Paying Living Expenses: Not on file  Food Insecurity:   . Worried About Charity fundraiser in the Last Year: Not on file  . Ran Out of  Food in the Last Year: Not on file  Transportation Needs:   . Lack of Transportation (Medical): Not on file  . Lack of Transportation (Non-Medical): Not on file  Physical Activity:   . Days of Exercise per Week: Not on file  . Minutes of Exercise per Session: Not on file  Stress:   . Feeling of Stress : Not on file  Social Connections:   . Frequency of Communication with Friends and Family: Not on file  . Frequency of Social Gatherings with Friends and Family: Not on file  . Attends Religious Services: Not on file  . Active Member of Clubs or Organizations: Not on file  . Attends Archivist Meetings: Not on file  . Marital Status: Not on file    Family History  Problem Relation Age of Onset  . Coronary artery disease Mother        strong family history of  . Heart attack Mother        dying of (at age 74)  . Diabetes Mother   . Diabetes Sister   . Diabetes Brother   . Pancreatitis Brother   . Healthy Daughter   . Hyperlipidemia Daughter   . Heart disease Brother   . Hypertension Brother   . Hyperlipidemia Brother      Outpatient Encounter Medications as of 05/29/2020  Medication Sig  . Cholecalciferol (VITAMIN D-1000 MAX ST) 1000 units tablet Take 4,000 Units by mouth daily.  . clopidogrel (PLAVIX) 75 MG tablet Take 1 tablet (75 mg total) by mouth daily.  . diazepam (VALIUM) 10 MG tablet Take 10 mg by mouth at bedtime as needed for anxiety or sleep. As Needed  . hydrochlorothiazide (HYDRODIURIL) 12.5 MG tablet Take 1 tablet by mouth daily.  Marland Kitchen HYDROcodone-acetaminophen (NORCO) 10-325 MG tablet Take 1 tablet every 4 (four) hours as needed by mouth for severe pain.   Marland Kitchen insulin NPH-regular Human (NOVOLIN 70/30) (70-30) 100 UNIT/ML injection Inject 30 Units into the skin 2 (two) times daily before a meal.  . lansoprazole (PREVACID) 30 MG capsule Take 1 capsule (30 mg total) by mouth 2 (two) times daily before a meal.  . metFORMIN (GLUCOPHAGE XR) 500 MG 24 hr tablet Take 1 tablet (500 mg total) by mouth daily with breakfast.  . metoprolol tartrate (LOPRESSOR) 100 MG tablet Take 25 mg by mouth 2 (two) times daily.   . nitroGLYCERIN (NITROSTAT) 0.4 MG SL tablet Place 0.4 mg under the tongue every 5 (five) minutes as needed for chest pain. Reported on 02/26/2016  . NON FORMULARY Domperidone 10 mg Patient takes 2 by mouth daily  . omega-3 acid ethyl esters (LOVAZA) 1 g capsule Take 2 capsules (2 g total) by mouth 2 (two) times daily.  . ondansetron (ZOFRAN) 4 MG tablet Take 4 mg by mouth every 8 (eight) hours as needed for nausea or vomiting.  . Pancrelipase, Lip-Prot-Amyl, 6000 units CPEP Take by mouth as needed.   Marland Kitchen PARoxetine (PAXIL) 10 MG tablet Take 10 mg by mouth daily.    . pregabalin (LYRICA) 150 MG capsule Take 150 mg by mouth 2 (two) times daily.   . promethazine (PHENERGAN) 25 MG tablet Take 25 mg by mouth every 8 (eight) hours as needed for nausea or vomiting.   . sildenafil (VIAGRA) 50 MG tablet Take 1 tablet (50 mg total) by mouth daily as needed for erectile dysfunction.  Marland Kitchen tetrahydrozoline 0.05 %  ophthalmic solution Apply to eye.  . [DISCONTINUED] glipiZIDE (GLUCOTROL) 10 MG tablet  Take 10 mg by mouth 2 (two) times daily.   . [DISCONTINUED] metFORMIN (GLUCOPHAGE) 1000 MG tablet Take 1,000 mg by mouth 2 (two) times daily with a meal.    No facility-administered encounter medications on file as of 05/29/2020.    ALLERGIES: Allergies  Allergen Reactions  . Metoclopramide Hcl Hives    Patient became very spastic  . Enalapril Cough  . Iodinated Diagnostic Agents     Rash, nausea and flushing  . Iohexol      Desc: HIVES,SOB NEEDS PREP.MEDS   . Propofol Other (See Comments)    Pancreatic attack.  . Sulfonamide Derivatives     Large hives  . Penicillins Rash    Has patient had a PCN reaction causing immediate rash, facial/tongue/throat swelling, SOB or lightheadedness with hypotension: Yes Has patient had a PCN reaction causing severe rash involving mucus membranes or skin necrosis: No Has patient had a PCN reaction that required hospitalization No Has patient had a PCN reaction occurring within the last 10 years: No. If all of the above answers are "NO", then may proceed with Cephalosporin use.   . Sulfa Antibiotics Rash    Other Reaction: Not Assessed    VACCINATION STATUS:  There is no immunization history on file for this patient.  Diabetes He presents for his initial diabetic visit. Diabetes type: Pancreatic diabetes. His disease course has been improving. There are no hypoglycemic associated symptoms. Pertinent negatives for hypoglycemia include no confusion, headaches, pallor or seizures. Associated symptoms include polydipsia and polyuria. Pertinent negatives for diabetes include no chest pain, no fatigue, no polyphagia and no weakness. There are no hypoglycemic complications. Symptoms are improving. Diabetic complications include heart disease, impotence and peripheral neuropathy. Risk factors for coronary artery disease include dyslipidemia, diabetes mellitus, family  history, male sex, hypertension and sedentary lifestyle. Current diabetic treatments: Is currently on Novolin 70/30 49 units nightly, glipizide 10 mg p.o. daily, Metformin 1000 mg p.o. twice daily. His weight is fluctuating minimally. He is following a generally unhealthy diet. When asked about meal planning, he reported none. He has not had a previous visit with a dietitian. He participates in exercise intermittently. His breakfast blood glucose range is generally 140-180 mg/dl. His lunch blood glucose range is generally 140-180 mg/dl. His dinner blood glucose range is generally 140-180 mg/dl. His bedtime blood glucose range is generally 140-180 mg/dl. His overall blood glucose range is 140-180 mg/dl. (He wears a CGM device.  Analysis and printouts was reviewed with him.  He has 45% time in range, 53% above range, 2% hypoglycemia.  His average blood glucose is 187 for the last 14 days.  His point-of-care A1c today is 7.9%, improving from 8.6%.) An ACE inhibitor/angiotensin II receptor blocker is not being taken. Eye exam is current.  Hyperlipidemia This is a chronic problem. The current episode started more than 1 year ago. Recent lipid tests were reviewed and are high. Pertinent negatives include no chest pain, myalgias or shortness of breath. Current antihyperlipidemic treatment includes bile acid sequestrants (He has statin intolerance.). Risk factors for coronary artery disease include dyslipidemia, diabetes mellitus, family history, hypertension, male sex and a sedentary lifestyle.  Hypertension This is a chronic problem. The current episode started more than 1 year ago. The problem is controlled. Pertinent negatives include no chest pain, headaches, neck pain, palpitations or shortness of breath. Risk factors for coronary artery disease include dyslipidemia, diabetes mellitus, family history, male gender and sedentary lifestyle. Past treatments include diuretics and beta blockers. Hypertensive end-organ  damage includes CAD/MI.     Review of Systems  Constitutional: Negative for chills, fatigue, fever and unexpected weight change.  HENT: Negative for dental problem, mouth sores and trouble swallowing.   Eyes: Negative for visual disturbance.  Respiratory: Negative for cough, choking, chest tightness, shortness of breath and wheezing.   Cardiovascular: Negative for chest pain, palpitations and leg swelling.  Gastrointestinal: Negative for abdominal distention, abdominal pain, constipation, diarrhea, nausea and vomiting.  Endocrine: Positive for polydipsia and polyuria. Negative for polyphagia.  Genitourinary: Positive for impotence. Negative for dysuria, flank pain, hematuria and urgency.  Musculoskeletal: Negative for back pain, gait problem, myalgias and neck pain.  Skin: Negative for pallor, rash and wound.  Neurological: Negative for seizures, syncope, weakness, numbness and headaches.  Psychiatric/Behavioral: Negative for confusion and dysphoric mood.    Objective:    Vitals with BMI 05/29/2020 03/27/2020 02/20/2020  Height 5' 9.5" 5' 9.5" 5' 9.5"  Weight 164 lbs 164 lbs 6 oz 165 lbs  BMI 23.88 09.98 33.82  Systolic 505 397 673  Diastolic 71 77 91  Pulse 74 66 72    BP 124/71   Pulse 74   Ht 5' 9.5" (1.765 m)   Wt 164 lb (74.4 kg)   BMI 23.87 kg/m   Wt Readings from Last 3 Encounters:  05/29/20 164 lb (74.4 kg)  03/27/20 164 lb 6.4 oz (74.6 kg)  02/20/20 165 lb (74.8 kg)     Physical Exam Constitutional:      General: He is not in acute distress.    Appearance: He is well-developed.  HENT:     Head: Normocephalic and atraumatic.  Neck:     Thyroid: No thyromegaly.     Trachea: No tracheal deviation.  Cardiovascular:     Rate and Rhythm: Normal rate.     Pulses:          Dorsalis pedis pulses are 1+ on the right side and 1+ on the left side.       Posterior tibial pulses are 1+ on the right side and 1+ on the left side.     Heart sounds: Normal heart sounds, S1  normal and S2 normal. No murmur heard.  No gallop.   Pulmonary:     Effort: No respiratory distress.     Breath sounds: Normal breath sounds. No wheezing.  Abdominal:     General: Bowel sounds are normal. There is no distension.     Palpations: Abdomen is soft.     Tenderness: There is no abdominal tenderness. There is no guarding.  Musculoskeletal:     Right shoulder: No swelling or deformity.     Cervical back: Normal range of motion and neck supple.  Skin:    General: Skin is warm and dry.     Findings: No rash.     Nails: There is no clubbing.  Neurological:     Mental Status: He is alert and oriented to person, place, and time.     Cranial Nerves: No cranial nerve deficit.     Sensory: No sensory deficit.     Gait: Gait normal.     Deep Tendon Reflexes: Reflexes are normal and symmetric.  Psychiatric:        Speech: Speech normal.        Behavior: Behavior normal. Behavior is cooperative.        Thought Content: Thought content normal.        Judgment: Judgment normal.       CMP (  most recent) CMP     Component Value Date/Time   NA 133 (L) 08/27/2018 0403   K 3.6 08/27/2018 0403   CL 100 08/27/2018 0403   CO2 20 (L) 01/08/2018 1810   GLUCOSE 303 (H) 08/27/2018 0403   BUN 28 (H) 08/27/2018 0403   CREATININE 1.00 08/27/2018 0403   CREATININE 1.27 07/08/2017 1343   CALCIUM 9.4 01/08/2018 1810   PROT 6.8 05/19/2020 1607   ALBUMIN 3.8 01/08/2018 1810   AST 35 01/08/2018 1810   ALT 20 01/08/2018 1810   ALKPHOS 47 01/08/2018 1810   BILITOT 1.3 (H) 01/08/2018 1810   GFRNONAA 50 (L) 01/08/2018 1810   GFRAA 57 (L) 01/08/2018 1810     Diabetic Labs (most recent): Lab Results  Component Value Date   HGBA1C 7.9 (A) 05/29/2020   HGBA1C 8.6 (H) 05/19/2020   HGBA1C 8.0 (H) 03/11/2011     Lipid Panel ( most recent) Lipid Panel     Component Value Date/Time   CHOL 262 (H) 08/19/2016 1259   TRIG 433 (H) 08/19/2016 1259   HDL 38 (L) 08/19/2016 1259   CHOLHDL  6.9 (H) 08/19/2016 1259   VLDL NOT CALC 08/19/2016 1259   LDLCALC NOT CALC 08/19/2016 1259   LDLDIRECT 110 08/19/2016 1259          Assessment & Plan:   1.  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 Recent labs reviewed.  His medical history is long and complicated. He wears a CGM device.  Analysis and printouts was reviewed with him.  He has 45% time in range, 53% above range, 2% hypoglycemia.  His average blood glucose is 187 for the last 14 days.  His point-of-care A1c today is 7.9%, improving from 8.6%.  - I had a long discussion with him about the pathology behind his diabetes and its complications. -his diabetes is complicated by coronary artery disease which required intervention, ED, peripheral neuropathy and he remains at a high risk for more acute and chronic complications which include CAD, CVA, CKD, retinopathy, and neuropathy. These are all discussed in detail with him.  - I have counseled him on diet  and weight management  by adopting a carbohydrate restricted/protein rich diet. Patient is encouraged to switch to  unprocessed or minimally processed     complex starch and increased protein intake (animal or plant source), fruits, and vegetables. -  he is advised to stick to a routine mealtimes to eat 3 meals  a day and avoid unnecessary snacks ( to snack only to correct hypoglycemia).   - he admits that there is a room for improvement in his food and drink choices. - Suggestion is made for him to avoid simple carbohydrates  from his diet including Cakes, Sweet Desserts, Ice Cream, Soda (diet and regular), Sweet Tea, Candies, Chips, Cookies, Store Bought Juices, Alcohol in Excess of  1-2 drinks a day, Artificial Sweeteners,  Coffee Creamer, and "Sugar-free" Products. This will help patient to have more stable  blood glucose profile and potentially avoid unintended weight gain.  - he will be scheduled with Jearld Fenton, RDN, CDE for diabetes education.  - I have approached him with the following individualized plan to manage  his diabetes and patient agrees:   -In light of his diabetes being due to his pancreatic insufficiency, he will be  treated with multiple daily injections of insulin. -Until he finishes his current supplies of premixed insulin, he is advised to increase his Novolin 70/30 to 30 units twice daily-with breakfast and supper for premeal blood glucose readings above 90 mg per DL, associated with documenting glucose profile at least 4 times a day before meals and at bedtime using his CGM.  - he is warned not to take insulin without proper monitoring per orders.  - he is encouraged to call clinic for blood glucose levels less than 70 or above 200 mg /dl. - he will not benefit from glipizide, advised to discontinue.  His Metformin also will be minimized to 500 mg XR p.o. daily with breakfast. -He will be considered for insulin analogs utilizing Lantus and NovoLog if necessary on next visit. - he is not a suitable candidate for incretin therapy.    - Specific targets for  A1c;  LDL, HDL,  and Triglycerides were discussed with the patient.  2) Blood Pressure /Hypertension:  his blood pressure is  controlled to target.   he is advised to continue his current medications including hydrochlorothiazide, and metoprolol.  3) Lipids/Hyperlipidemia:   Review of his recent lipid panel showed  controlled triglycerides are 900+.  He does not tolerate statins.  He is on Lovaza 2 g p.o. twice daily, advised to continue.  He is also advised to avoid butter and fried food.      4)  Weight/Diet:  Body mass index is 23.87 kg/m.  -     he is not a candidate for weight loss. I discussed with him the fact that loss of 5 - 10% of his  current body weight will have the most impact on his diabetes management.   Exercise, and detailed carbohydrates information provided  -  detailed on discharge instructions.  5) Chronic Care/Health Maintenance:  -he  Is not  on ACEI/ARB and Statin medications and  is encouraged to initiate and continue to follow up with Ophthalmology, Dentist,  Podiatrist at least yearly or according to recommendations, and advised to   stay away from smoking. I have recommended yearly flu vaccine and pneumonia vaccine at least every 5 years; moderate intensity exercise for up to 150 minutes weekly; and  sleep for at least 7 hours a day.  6) pancreatic insufficiency -He is advised to continue his enzymatic replacements currently pancrelipase 6000 units with meals and snacks.  He will be considered for Creon as appropriate next visit.    - he is  advised to maintain close follow up with Lemmie Evens, MD for primary care needs, as well as his other providers for optimal and coordinated care.   - Time spent in this patient care: 60 min, of which > 50% was spent in  counseling  him about his pancreatic diabetes, hyperlipidemia, hypertension and the rest reviewing his blood glucose logs , discussing his hypoglycemia and hyperglycemia episodes, reviewing his current and  previous labs / studies  ( including abstraction from other facilities) and medications  doses and developing a  long term treatment plan based on the latest standards of care/ guidelines; and documenting his care.    Please refer to Patient Instructions for Blood Glucose Monitoring and Insulin/Medications Dosing Guide"  in media tab for additional information. Please  also refer to " Patient Self Inventory" in the Media  tab for reviewed elements of pertinent patient history.  Maurice March participated in the discussions, expressed understanding, and voiced agreement with the above plans.  All questions were answered to his satisfaction. he is encouraged to contact clinic should he have any questions or concerns prior to  his return visit.   Follow up plan: - Return in 1 week (on 06/05/2020) for F/U with Meter and Logs Only - no Labs.  Glade Lloyd, MD [facility] [address] [place], [place] [address] Phone: [phone]  Fax: [phone]    05/29/2020, 6:01 PM  This note was partially dictated with voice recognition software. Similar sounding words can be transcribed inadequately or may not  be corrected upon review.

## 2020-06-05 ENCOUNTER — Ambulatory Visit: Payer: PPO | Admitting: "Endocrinology

## 2020-06-11 ENCOUNTER — Other Ambulatory Visit: Payer: Self-pay

## 2020-06-11 ENCOUNTER — Ambulatory Visit (INDEPENDENT_AMBULATORY_CARE_PROVIDER_SITE_OTHER): Payer: PPO | Admitting: "Endocrinology

## 2020-06-11 ENCOUNTER — Encounter: Payer: Self-pay | Admitting: "Endocrinology

## 2020-06-11 VITALS — BP 130/84 | HR 60 | Ht 69.5 in | Wt 167.2 lb

## 2020-06-11 DIAGNOSIS — IMO0002 Reserved for concepts with insufficient information to code with codable children: Secondary | ICD-10-CM

## 2020-06-11 DIAGNOSIS — I10 Essential (primary) hypertension: Secondary | ICD-10-CM | POA: Diagnosis not present

## 2020-06-11 DIAGNOSIS — K8689 Other specified diseases of pancreas: Secondary | ICD-10-CM

## 2020-06-11 DIAGNOSIS — E0865 Diabetes mellitus due to underlying condition with hyperglycemia: Secondary | ICD-10-CM | POA: Diagnosis not present

## 2020-06-11 DIAGNOSIS — Z789 Other specified health status: Secondary | ICD-10-CM | POA: Diagnosis not present

## 2020-06-11 DIAGNOSIS — E782 Mixed hyperlipidemia: Secondary | ICD-10-CM | POA: Diagnosis not present

## 2020-06-11 MED ORDER — METFORMIN HCL ER 500 MG PO TB24
500.0000 mg | ORAL_TABLET | Freq: Every day | ORAL | 1 refills | Status: DC
Start: 2020-06-11 — End: 2021-03-31

## 2020-06-11 NOTE — Patient Instructions (Signed)

## 2020-06-11 NOTE — Progress Notes (Signed)
06/11/2020, 5:35 PM   Endocrinology follow-up note  Subjective:    Patient ID: [name], male    DOB: [birth year].  [name] is being seen in  follow-up after he was seen in consultation for management of currently uncontrolled symptomatic diabetes requested by  Lemmie Evens, MD.   Past Medical History:  Diagnosis Date  . Anemia   . 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 Cervical spinal stenosis 05/06/2020   C5-6 level  . Chronic gastritis   . Coronary artery disease    Patent Stent to Circ.  Patent coronary bypass grafts with a free radial graft to PDA and LIMA to the  diag.  . Diabetes mellitus   . Diabetic peripheral neuropathy ([place]) 05/06/2020  . Dyslipidemia    Poorly controlled, probably related to his DM (Some of this is secondary to his inability to afford medications).  . ED (erectile dysfunction)   . Esophageal yeast infection ([place])   . Metaplasia of esophagus   . Pancreatitis chronic    Pseudocyst  . Polycythemia vera(238.4) 04/26/2012    Past Surgical History:  Procedure Laterality Date  . APPENDECTOMY    . CARDIAC CATHETERIZATION    . CHOLECYSTECTOMY    . COLONOSCOPY  07/31/1985  . COLONOSCOPY N/A 03/11/2016   Procedure: COLONOSCOPY;  Surgeon: Rogene Houston, MD;  Location: [facility];  Service: Endoscopy;  Laterality: N/A;  . CORONARY ANGIOPLASTY WITH STENT PLACEMENT  2006  . CORONARY ARTERY BYPASS GRAFT  11/24/2004  . ESOPHAGOGASTRODUODENOSCOPY  09/19/01  . ESOPHAGOGASTRODUODENOSCOPY N/A 03/11/2016   Procedure: ESOPHAGOGASTRODUODENOSCOPY (EGD);  Surgeon: Rogene Houston, MD;  Location: [facility];  Service: Endoscopy;  Laterality: N/A;  12:00  . FINGER TENDON REPAIR     in middle finger  . KNEE SURGERY     left knee  . NASAL SEPTUM SURGERY      Social History   Socioeconomic History  . Marital status: Married    Spouse name: Not on file  . Number of children: Not on file  . Years of education: Not on  file  . Highest education level: Not on file  Occupational History  . Not on file  Tobacco Use  . Smoking status: Never Smoker  . Smokeless tobacco: Never Used  Substance and Sexual Activity  . Alcohol use: Yes    Comment: Patient may drink a beer or glass of wine a couple times a month  . Drug use: No  . Sexual activity: Not on file  Other Topics Concern  . Not on file  Social History Narrative   The patient lives in [place] with his wife.  He is     disabled secondary to coronary artery disease and pancreatitis.  He does     not smoke, he does not drink alcohol.  There is no drug use.    Social Determinants of Health   Financial Resource Strain:   . Difficulty of Paying Living Expenses: Not on file  Food Insecurity:   . Worried About Charity fundraiser in the Last Year: Not on file  . Ran Out of Food in the Last Year: Not on file  Transportation Needs:   . Lack of Transportation (Medical): Not on file  . Lack of Transportation (Non-Medical): Not on file  Physical Activity:   . Days of Exercise per Week: Not on file  . Minutes of Exercise per Session: Not on  file  Stress:   . Feeling of Stress : Not on file  Social Connections:   . Frequency of Communication with Friends and Family: Not on file  . Frequency of Social Gatherings with Friends and Family: Not on file  . Attends Religious Services: Not on file  . Active Member of Clubs or Organizations: Not on file  . Attends Archivist Meetings: Not on file  . Marital Status: Not on file    Family History  Problem Relation Age of Onset  . Coronary artery disease Mother        strong family history of  . Heart attack Mother        dying of (at age 26)  . Diabetes Mother   . Diabetes Sister   . Diabetes Brother   . Pancreatitis Brother   . Healthy Daughter   . Hyperlipidemia Daughter   . Heart disease Brother   . Hypertension Brother   . Hyperlipidemia Brother     Outpatient Encounter Medications  as of 06/11/2020  Medication Sig  . gabapentin (NEURONTIN) 100 MG capsule Take 100 mg by mouth 2 (two) times daily.  . Cholecalciferol (VITAMIN D-1000 MAX ST) 1000 units tablet Take 4,000 Units by mouth daily.  . clopidogrel (PLAVIX) 75 MG tablet Take 1 tablet (75 mg total) by mouth daily.  . diazepam (VALIUM) 10 MG tablet Take 10 mg by mouth at bedtime as needed for anxiety or sleep. As Needed  . gabapentin (NEURONTIN) 300 MG capsule Take 1 capsule by mouth 2 (two) times daily.  . hydrochlorothiazide (HYDRODIURIL) 25 MG tablet Take 25 mg by mouth 2 (two) times daily. Pt takes 1 pill each morning and 1/2 a pill each evening  . HYDROcodone-acetaminophen (NORCO) 10-325 MG tablet Take 1 tablet every 4 (four) hours as needed by mouth for severe pain.   Marland Kitchen insulin NPH-regular Human (NOVOLIN 70/30) (70-30) 100 UNIT/ML injection Inject 40 Units into the skin 2 (two) times daily before a meal. When glucose is above 80 and eating  . lansoprazole (PREVACID) 30 MG capsule Take 1 capsule (30 mg total) by mouth 2 (two) times daily before a meal. (Patient taking differently: Take 30 mg by mouth 2 (two) times daily before a meal. Pt states he takes 4 pills a week)  . metFORMIN (GLUCOPHAGE XR) 500 MG 24 hr tablet Take 1 tablet (500 mg total) by mouth daily with breakfast.  . metoprolol tartrate (LOPRESSOR) 100 MG tablet Take 25 mg by mouth 2 (two) times daily.   . nitroGLYCERIN (NITROSTAT) 0.4 MG SL tablet Place 0.4 mg under the tongue every 5 (five) minutes as needed for chest pain. Reported on 02/26/2016  . NON FORMULARY Domperidone 10 mg Patient takes 2 by mouth daily  . omega-3 acid ethyl esters (LOVAZA) 1 g capsule Take 2 capsules (2 g total) by mouth 2 (two) times daily.  . ondansetron (ZOFRAN) 4 MG tablet Take 4 mg by mouth every 8 (eight) hours as needed for nausea or vomiting.  . Pancrelipase, Lip-Prot-Amyl, 6000 units CPEP Take by mouth as needed.   Marland Kitchen PARoxetine (PAXIL) 10 MG tablet Take 10 mg by mouth  daily.    . promethazine (PHENERGAN) 25 MG tablet Take 25 mg by mouth every 8 (eight) hours as needed for nausea or vomiting.   . sildenafil (VIAGRA) 50 MG tablet Take 1 tablet (50 mg total) by mouth daily as needed for erectile dysfunction.  Marland Kitchen tetrahydrozoline 0.05 % ophthalmic solution Apply to eye.  . [  DISCONTINUED] hydrochlorothiazide (HYDRODIURIL) 12.5 MG tablet Take 1 tablet by mouth daily.  . [DISCONTINUED] metFORMIN (GLUCOPHAGE XR) 500 MG 24 hr tablet Take 1 tablet (500 mg total) by mouth daily with breakfast.  . [DISCONTINUED] pregabalin (LYRICA) 150 MG capsule Take 150 mg by mouth 2 (two) times daily.    No facility-administered encounter medications on file as of 06/11/2020.    ALLERGIES: Allergies  Allergen Reactions  . Metoclopramide Hcl Hives    Patient became very spastic  . Enalapril Cough  . Iodinated Diagnostic Agents     Rash, nausea and flushing  . Iohexol      Desc: HIVES,SOB NEEDS PREP.MEDS   . Propofol Other (See Comments)    Pancreatic attack.  . Sulfonamide Derivatives     Large hives  . Penicillins Rash    Has patient had a PCN reaction causing immediate rash, facial/tongue/throat swelling, SOB or lightheadedness with hypotension: Yes Has patient had a PCN reaction causing severe rash involving mucus membranes or skin necrosis: No Has patient had a PCN reaction that required hospitalization No Has patient had a PCN reaction occurring within the last 10 years: No. If all of the above answers are "NO", then may proceed with Cephalosporin use.   . Sulfa Antibiotics Rash    Other Reaction: Not Assessed    VACCINATION STATUS:  There is no immunization history on file for this patient.  Diabetes He presents for his follow-up diabetic visit. Diabetes type: Pancreatic diabetes. His disease course has been improving. There are no hypoglycemic associated symptoms. Pertinent negatives for hypoglycemia include no confusion, headaches, pallor or seizures.  Pertinent negatives for diabetes include no chest pain, no fatigue, no polydipsia, no polyphagia, no polyuria and no weakness. There are no hypoglycemic complications. Symptoms are improving. Diabetic complications include heart disease, impotence and peripheral neuropathy. Risk factors for coronary artery disease include dyslipidemia, diabetes mellitus, family history, male sex, hypertension and sedentary lifestyle. Current diabetic treatments: Is currently on Novolin 70/30 49 units nightly, glipizide 10 mg p.o. daily, Metformin 1000 mg p.o. twice daily. His weight is fluctuating minimally. He is following a generally unhealthy diet. When asked about meal planning, he reported none. He has not had a previous visit with a dietitian. He participates in exercise intermittently. His breakfast blood glucose range is generally 140-180 mg/dl. His lunch blood glucose range is generally 140-180 mg/dl. His dinner blood glucose range is generally 140-180 mg/dl. His bedtime blood glucose range is generally 140-180 mg/dl. His overall blood glucose range is 140-180 mg/dl. (He wears a CGM device.  Printout he is at 46% time in range, 53% above range, no major hypoglycemia.  His recent point-of-care A1c was 7.9%.  ) An ACE inhibitor/angiotensin II receptor blocker is not being taken. Eye exam is current.  Hyperlipidemia This is a chronic problem. The current episode started more than 1 year ago. Recent lipid tests were reviewed and are high. Pertinent negatives include no chest pain, myalgias or shortness of breath. Current antihyperlipidemic treatment includes bile acid sequestrants (He has statin intolerance.). Risk factors for coronary artery disease include dyslipidemia, diabetes mellitus, family history, hypertension, male sex and a sedentary lifestyle.  Hypertension This is a chronic problem. The current episode started more than 1 year ago. The problem is controlled. Pertinent negatives include no chest pain, headaches,  neck pain, palpitations or shortness of breath. Risk factors for coronary artery disease include dyslipidemia, diabetes mellitus, family history, male gender and sedentary lifestyle. Past treatments include diuretics and beta blockers. Hypertensive  end-organ damage includes CAD/MI.     Review of Systems  Constitutional: Negative for chills, fatigue, fever and unexpected weight change.  HENT: Negative for dental problem, mouth sores and trouble swallowing.   Eyes: Negative for visual disturbance.  Respiratory: Negative for cough, choking, chest tightness, shortness of breath and wheezing.   Cardiovascular: Negative for chest pain, palpitations and leg swelling.  Gastrointestinal: Negative for abdominal distention, abdominal pain, constipation, diarrhea, nausea and vomiting.  Endocrine: Negative for polydipsia, polyphagia and polyuria.  Genitourinary: Positive for impotence. Negative for dysuria, flank pain, hematuria and urgency.  Musculoskeletal: Negative for back pain, gait problem, myalgias and neck pain.  Skin: Negative for pallor, rash and wound.  Neurological: Negative for seizures, syncope, weakness, numbness and headaches.  Psychiatric/Behavioral: Negative for confusion and dysphoric mood.    Objective:    Vitals with BMI 06/11/2020 05/29/2020 03/27/2020  Height 5' 9.5" 5' 9.5" 5' 9.5"  Weight 167 lbs 3 oz 164 lbs 164 lbs 6 oz  BMI 24.35 16.10 96.04  Systolic 540 981 191  Diastolic 84 71 77  Pulse 60 74 66    BP 130/84   Pulse 60   Ht 5' 9.5" (1.765 m)   Wt 167 lb 3.2 oz (75.8 kg)   BMI 24.34 kg/m   Wt Readings from Last 3 Encounters:  06/11/20 167 lb 3.2 oz (75.8 kg)  05/29/20 164 lb (74.4 kg)  03/27/20 164 lb 6.4 oz (74.6 kg)     Physical Exam Constitutional:      General: He is not in acute distress.    Appearance: He is well-developed.  HENT:     Head: Normocephalic and atraumatic.  Neck:     Thyroid: No thyromegaly.     Trachea: No tracheal deviation.   Cardiovascular:     Rate and Rhythm: Normal rate.     Pulses:          Dorsalis pedis pulses are 1+ on the right side and 1+ on the left side.       Posterior tibial pulses are 1+ on the right side and 1+ on the left side.     Heart sounds: Normal heart sounds, S1 normal and S2 normal. No murmur heard.  No gallop.   Pulmonary:     Effort: No respiratory distress.     Breath sounds: Normal breath sounds. No wheezing.  Abdominal:     General: Bowel sounds are normal. There is no distension.     Palpations: Abdomen is soft.     Tenderness: There is no abdominal tenderness. There is no guarding.  Musculoskeletal:     Right shoulder: No swelling or deformity.     Cervical back: Normal range of motion and neck supple.  Skin:    General: Skin is warm and dry.     Findings: No rash.     Nails: There is no clubbing.  Neurological:     Mental Status: He is alert and oriented to person, place, and time.     Cranial Nerves: No cranial nerve deficit.     Sensory: No sensory deficit.     Gait: Gait normal.     Deep Tendon Reflexes: Reflexes are normal and symmetric.  Psychiatric:        Speech: Speech normal.        Behavior: Behavior normal. Behavior is cooperative.        Thought Content: Thought content normal.        Judgment: Judgment normal.  CMP ( most recent) CMP     Component Value Date/Time   NA 133 (L) 08/27/2018 0403   K 3.6 08/27/2018 0403   CL 100 08/27/2018 0403   CO2 20 (L) 01/08/2018 1810   GLUCOSE 303 (H) 08/27/2018 0403   BUN 28 (H) 08/27/2018 0403   CREATININE 1.00 08/27/2018 0403   CREATININE 1.27 07/08/2017 1343   CALCIUM 9.4 01/08/2018 1810   PROT 6.8 05/19/2020 1607   ALBUMIN 3.8 01/08/2018 1810   AST 35 01/08/2018 1810   ALT 20 01/08/2018 1810   ALKPHOS 47 01/08/2018 1810   BILITOT 1.3 (H) 01/08/2018 1810   GFRNONAA 50 (L) 01/08/2018 1810   GFRAA 57 (L) 01/08/2018 1810     Diabetic Labs (most recent): Lab Results  Component Value Date    HGBA1C 7.9 (A) 05/29/2020   HGBA1C 8.6 (H) 05/19/2020   HGBA1C 8.0 (H) 03/11/2011     Lipid Panel ( most recent) Lipid Panel     Component Value Date/Time   CHOL 262 (H) 08/19/2016 1259   TRIG 433 (H) 08/19/2016 1259   HDL 38 (L) 08/19/2016 1259   CHOLHDL 6.9 (H) 08/19/2016 1259   VLDL NOT CALC 08/19/2016 1259   LDLCALC NOT CALC 08/19/2016 1259   LDLDIRECT 110 08/19/2016 1259          Assessment & Plan:   1.  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 Recent labs reviewed.  His medical history is long and complicated. He wears a CGM device.  Printout he is at 46% time in range, 53% above range, no major hypoglycemia.  His recent point-of-care A1c was 7.9%.   - I had a long discussion with him about the pathology behind his diabetes and its complications. -his diabetes is complicated by coronary artery disease which required intervention, ED, peripheral neuropathy and he remains at a high risk for more acute and chronic complications which include CAD, CVA, CKD, retinopathy, and neuropathy. These are all discussed in detail with him.  - I have counseled him on diet  and weight management  by adopting a carbohydrate restricted/protein rich diet. Patient is encouraged to switch to  unprocessed or minimally processed     complex starch and increased protein intake (animal or plant source), fruits, and vegetables. -  he is advised to stick to a routine mealtimes to eat 3 meals  a day and avoid unnecessary snacks ( to snack only to correct hypoglycemia).   - he  admits there is a room for improvement in his diet and drink choices. -  Suggestion is made for him to avoid simple carbohydrates  from his diet including Cakes, Sweet Desserts / Pastries, Ice Cream, Soda (diet and regular), Sweet Tea, Candies, Chips, Cookies,  Sweet Pastries,  Store Bought Juices, Alcohol in Excess of  1-2 drinks a day, Artificial Sweeteners, Coffee Creamer, and "Sugar-free" Products. This will help patient to have stable blood glucose profile and potentially avoid unintended weight gain.   - he will be scheduled with Jearld Fenton, RDN, CDE for diabetes education.  - I have approached him with the following individualized plan to manage  his diabetes and patient agrees:   -In light of his diabetes being due to his pancreatic insufficiency, he will be treated with multiple daily injections of insulin. -He will continue until he  finisheto respond to his current regimen of Novolin 70/30.  He is advihis dose to 40 units twice daily-  with breakfast and supper for premeal blood glucose readings above 80 mg per DL, associated with documenting glucose profile at least 4 times a day before meals and at bedtime using his CGM.  - he is warned not to take insulin without proper monitoring per orders.  - he is encouraged to call clinic for blood glucose levels less than 70 or above 200 mg /dl. -He has tolerated and benefiting from low-dose Metformin.  He is advised to continue Metformin 500 mg XR p.o. daily after breakfast.  -He will be considered for insulin analogs utilizing Lantus and NovoLog if necessary on next visit. - he is not a suitable candidate for incretin therapy.    - Specific targets for  A1c;  LDL, HDL,  and Triglycerides were discussed with the patient.  2) Blood Pressure /Hypertension: His blood pressure is controlled to target.  he is advised to continue his current medications including hydrochlorothiazide, and metoprolol.  3) Lipids/Hyperlipidemia:   Review of his recent lipid panel showed  controlled triglycerides are 900+.  He does not tolerate statins.  He is on Lovaza 2 g p.o. twice daily, advised to continue.  He is also advised to avoid butter and fried food.      4)  Weight/Diet:  Body mass index is 24.34 kg/m.   -     he is not a candidate for weight loss. I discussed with him the fact that loss of 5 - 10% of his  current body weight will have the most impact on his diabetes management.  Exercise, and detailed carbohydrates information provided  -  detailed on discharge instructions.  5) Chronic Care/Health Maintenance:  -he  Is not  on ACEI/ARB and Statin medications and  is encouraged to initiate and continue to follow up with Ophthalmology, Dentist,  Podiatrist at least yearly or according to recommendations, and advised to   stay away from smoking. I have recommended yearly flu vaccine and pneumonia vaccine at least every 5 years; moderate intensity exercise for up to 150 minutes weekly; and  sleep for at least 7 hours a day.  6) pancreatic insufficiency -He is advised to continue his enzymatic replacements currently pancrelipase 6000 units with meals and snacks.  He will be considered for Creon as appropriate next visit.   - he is  advised to maintain close follow up with Lemmie Evens, MD for primary care needs, as well as his other providers for optimal and coordinated care.   - Time spent on this patient care encounter:  35 min, of which > 50% was spent in  counseling and the rest reviewing his blood glucose logs , discussing his hypoglycemia and hyperglycemia episodes, reviewing his current and  previous labs / studies  ( including abstraction from other facilities) and medications  doses and developing a  long term treatment plan and documenting his care.   Please refer to Patient Instructions for Blood Glucose Monitoring and Insulin/Medications Dosing Guide"  in media tab for additional information. Please  also refer to " Patient Self Inventory" in the Media  tab for reviewed elements of pertinent patient history.  Maurice March participated in the discussions, expressed understanding, and voiced agreement with the above plans.  All questions were answered to his satisfaction. he is  encouraged to contact clinic should he have any questions or concerns prior to his return visit.  Follow up plan: - Return in about 3 months (around 09/11/2020) for F/U with Meter and Logs Only - no Labs, ABI in Office NV, Urine MA - NV, A1c -NV.  Glade Lloyd, MD [facility] [address] [place], [place] [address] Phone: [phone]  Fax: [phone]    06/11/2020, 5:35 PM  This note was partially dictated with voice recognition software. Similar sounding words can be transcribed inadequately or may not  be corrected upon review.

## 2020-06-12 DIAGNOSIS — E1142 Type 2 diabetes mellitus with diabetic polyneuropathy: Secondary | ICD-10-CM | POA: Diagnosis not present

## 2020-06-12 DIAGNOSIS — M7918 Myalgia, other site: Secondary | ICD-10-CM | POA: Diagnosis not present

## 2020-06-12 DIAGNOSIS — G8929 Other chronic pain: Secondary | ICD-10-CM | POA: Diagnosis not present

## 2020-06-12 DIAGNOSIS — M4802 Spinal stenosis, cervical region: Secondary | ICD-10-CM | POA: Diagnosis not present

## 2020-06-12 DIAGNOSIS — M47812 Spondylosis without myelopathy or radiculopathy, cervical region: Secondary | ICD-10-CM | POA: Diagnosis not present

## 2020-06-12 DIAGNOSIS — M5030 Other cervical disc degeneration, unspecified cervical region: Secondary | ICD-10-CM | POA: Diagnosis not present

## 2020-06-12 DIAGNOSIS — M4722 Other spondylosis with radiculopathy, cervical region: Secondary | ICD-10-CM | POA: Diagnosis not present

## 2020-06-12 DIAGNOSIS — G894 Chronic pain syndrome: Secondary | ICD-10-CM | POA: Diagnosis not present

## 2020-06-13 ENCOUNTER — Telehealth (INDEPENDENT_AMBULATORY_CARE_PROVIDER_SITE_OTHER): Payer: Self-pay | Admitting: *Deleted

## 2020-06-13 NOTE — Telephone Encounter (Signed)
Patient left a voicemail on 06/12/20 asking to speak with Dr.Rehman or myself. Patient was called this morning @9 :33 am. A message was left asking him to call the office back.

## 2020-06-16 NOTE — Telephone Encounter (Signed)
Talked with the patient. He had questions about his care and the patient 's questions were answered.

## 2020-06-23 ENCOUNTER — Ambulatory Visit (INDEPENDENT_AMBULATORY_CARE_PROVIDER_SITE_OTHER): Payer: PPO | Admitting: Internal Medicine

## 2020-06-23 ENCOUNTER — Encounter: Payer: Self-pay | Admitting: Internal Medicine

## 2020-06-23 ENCOUNTER — Other Ambulatory Visit: Payer: Self-pay

## 2020-06-23 VITALS — BP 136/87 | HR 72 | Ht 69.5 in | Wt 168.0 lb

## 2020-06-23 DIAGNOSIS — K8681 Exocrine pancreatic insufficiency: Secondary | ICD-10-CM

## 2020-06-23 DIAGNOSIS — E785 Hyperlipidemia, unspecified: Secondary | ICD-10-CM

## 2020-06-23 DIAGNOSIS — K219 Gastro-esophageal reflux disease without esophagitis: Secondary | ICD-10-CM | POA: Diagnosis not present

## 2020-06-23 DIAGNOSIS — D45 Polycythemia vera: Secondary | ICD-10-CM

## 2020-06-23 DIAGNOSIS — E1140 Type 2 diabetes mellitus with diabetic neuropathy, unspecified: Secondary | ICD-10-CM

## 2020-06-23 DIAGNOSIS — E1142 Type 2 diabetes mellitus with diabetic polyneuropathy: Secondary | ICD-10-CM

## 2020-06-23 DIAGNOSIS — Z794 Long term (current) use of insulin: Secondary | ICD-10-CM

## 2020-06-23 DIAGNOSIS — M4802 Spinal stenosis, cervical region: Secondary | ICD-10-CM

## 2020-06-23 DIAGNOSIS — Z7689 Persons encountering health services in other specified circumstances: Secondary | ICD-10-CM

## 2020-06-23 DIAGNOSIS — I10 Essential (primary) hypertension: Secondary | ICD-10-CM | POA: Diagnosis not present

## 2020-06-23 DIAGNOSIS — Z8719 Personal history of other diseases of the digestive system: Secondary | ICD-10-CM | POA: Diagnosis not present

## 2020-06-23 DIAGNOSIS — I25118 Atherosclerotic heart disease of native coronary artery with other forms of angina pectoris: Secondary | ICD-10-CM | POA: Diagnosis not present

## 2020-06-23 NOTE — Patient Instructions (Signed)
Please get blood tests done within a week, in the morning before breakfast.  Please continue to take medications as prescribed.  Please follow DASH diet for better control of hypertension.  DASH stands for Dietary Approaches to Stop Hypertension. The DASH diet is a healthy-eating plan designed to help treat or prevent high blood pressure (hypertension).  The DASH diet includes foods that are rich in potassium, calcium and magnesium. These nutrients help control blood pressure. The diet limits foods that are high in sodium, saturated fat and added sugars.  Studies have shown that the DASH diet can lower blood pressure in as little as two weeks. The diet can also lower low-density lipoprotein (LDL or "bad") cholesterol levels in the blood. High blood pressure and high LDL cholesterol levels are two major risk factors for heart disease and stroke.    DASH diet: Recommended servings The DASH diet provides daily and weekly nutritional goals. The number of servings you should have depends on your daily calorie needs.  Here's a look at the recommended servings from each food group for a 2,000-calorie-a-day DASH diet:  Grains: 6 to 8 servings a day. One serving is one slice bread, 1 ounce dry cereal, or 1/2 cup cooked cereal, rice or pasta. Vegetables: 4 to 5 servings a day. One serving is 1 cup raw leafy green vegetable, 1/2 cup cut-up raw or cooked vegetables, or 1/2 cup vegetable juice. Fruits: 4 to 5 servings a day. One serving is one medium fruit, 1/2 cup fresh, frozen or canned fruit, or 1/2 cup fruit juice. Fat-free or low-fat dairy products: 2 to 3 servings a day. One serving is 1 cup milk or yogurt, or 1 1/2 ounces cheese. Lean meats, poultry and fish: six 1-ounce servings or fewer a day. One serving is 1 ounce cooked meat, poultry or fish, or 1 egg. Nuts, seeds and legumes: 4 to 5 servings a week. One serving is 1/3 cup nuts, 2 tablespoons peanut butter, 2 tablespoons seeds, or 1/2 cup  cooked legumes (dried beans or peas). Fats and oils: 2 to 3 servings a day. One serving is 1 teaspoon soft margarine, 1 teaspoon vegetable oil, 1 tablespoon mayonnaise or 2 tablespoons salad dressing. Sweets and added sugars: 5 servings or fewer a week. One serving is 1 tablespoon sugar, jelly or jam, 1/2 cup sorbet, or 1 cup lemonade.

## 2020-06-24 ENCOUNTER — Encounter: Payer: Self-pay | Admitting: Internal Medicine

## 2020-06-24 DIAGNOSIS — Z7689 Persons encountering health services in other specified circumstances: Secondary | ICD-10-CM | POA: Insufficient documentation

## 2020-06-24 DIAGNOSIS — Z794 Long term (current) use of insulin: Secondary | ICD-10-CM | POA: Insufficient documentation

## 2020-06-24 DIAGNOSIS — E1140 Type 2 diabetes mellitus with diabetic neuropathy, unspecified: Secondary | ICD-10-CM | POA: Insufficient documentation

## 2020-06-24 NOTE — Assessment & Plan Note (Addendum)
Long-standing, severe b/l LE pain On Gabapentin 400 mg BID and Norco PRN SSRI and Valium for chronic pain and anxiety related to neuropathy

## 2020-06-24 NOTE — Assessment & Plan Note (Signed)
On Lansoprazole

## 2020-06-24 NOTE — Assessment & Plan Note (Signed)
On Plavix and B-blocker Nitroglycerin PRN for chest pain Follows up with Cardiologist

## 2020-06-24 NOTE — Assessment & Plan Note (Signed)
Care established Previous chart reviewed History and medications reviewed with the patient 

## 2020-06-24 NOTE — Assessment & Plan Note (Signed)
HbA1C: 7.9 (05/2020) On Novolin 40 U BID and Metformin, f/u with Dr Dorris Fetch Advised to follow diabetic diet Diabetic foot exam: Today Diabetic eye exam: Advised to follow up with Ophthalmology for diabetic eye exam

## 2020-06-24 NOTE — Assessment & Plan Note (Signed)
Continue Lovaza Intolerant to statin and Repatha

## 2020-06-24 NOTE — Assessment & Plan Note (Signed)
BP Readings from Last 1 Encounters:  06/23/20 136/87   Well-controlled with HCTZ and Metoprolol (mainly for CAD) Counseled for compliance with the medications Advised DASH diet and moderate exercise/walking, at least 150 mins/week

## 2020-06-24 NOTE — Assessment & Plan Note (Signed)
JAK2 negative Gets venipuncture every 3-4 months F/u Heme/Onc.

## 2020-06-24 NOTE — Assessment & Plan Note (Signed)
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 Laural Golden

## 2020-06-24 NOTE — Progress Notes (Signed)
New Patient Office Visit  Subjective:  Patient ID: [name], male    DOB: November 11, 1960  Age: 59 y.o. MRN: [id number]  CC:  Chief Complaint  Patient presents with  . New Patient (Initial Visit)    here to establish care    HPI [name] is a 59 year old male with past medical history of CAD status post CABG, hypertension, uncontrolled diabetes mellitus-on insulin, severe diabetic neuropathy, chronic pancreatitis, polycythemia vera, hyperlipidemia, cervical spinal stenosis and chronic back pain who presents for establishing care.  Patient complains of severe neuropathic pain in bilateral lower extremities.  He takes gabapentin 400 mg twice daily, paroxetine 10 mg once daily and Norco as needed for neuropathic pain.  He takes Valium 10 mg at bedtime as needed for anxiety and neuropathic pain.  He states that he had pancreatic insufficiency due to a side effect from anesthetic agent in the past.  Since then, patient has had chronic pancreatitis and has been taking pancrelipase for it.  Patient follows up with Dr. Laural Golden for it.  He occasionally experiences severe abdominal pain because of it, but states that it has been overall stable.  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  Patient also has a history of CAD s/p CABG and stent placement in the past.  He takes Plavix and metoprolol as well as nitroglycerin as needed for chest pain.  He takes hydrochlorothiazide for hypertension.  His BP is 136/87 in the office today.  He follows up with Cardiologist.  He also has a history of cervical spinal stenosis, for which he visits spine surgeon.  His neck pain is currently controlled with gabapentin and as needed Norco.  He had last colonoscopy in 2017.    Past Medical History:  Diagnosis Date  . Anemia   . Bypass graft stenosis ([place])    '06  . Cervical spinal stenosis  05/06/2020   C5-6 level  . Chronic gastritis   . Coronary artery disease    Patent Stent to Circ.  Patent coronary bypass grafts with a free radial graft to PDA and LIMA to the  diag.  . Diabetes mellitus   . Diabetic peripheral neuropathy ([place]) 05/06/2020  . Dyslipidemia    Poorly controlled, probably related to his DM (Some of this is secondary to his inability to afford medications).  . ED (erectile dysfunction)   . Esophageal yeast infection ([place])   . Metaplasia of esophagus   . Pancreatitis chronic    Pseudocyst  . Polycythemia vera(238.4) 04/26/2012    Past Surgical History:  Procedure Laterality Date  . APPENDECTOMY    . CARDIAC CATHETERIZATION    . CHOLECYSTECTOMY    . COLONOSCOPY  07/31/1985  . COLONOSCOPY N/A 03/11/2016   Procedure: COLONOSCOPY;  Surgeon: Rogene Houston, MD;  Location: [facility];  Service: Endoscopy;  Laterality: N/A;  . CORONARY ANGIOPLASTY WITH STENT PLACEMENT  2006  . CORONARY ARTERY BYPASS GRAFT  11/24/2004  . ESOPHAGOGASTRODUODENOSCOPY  09/19/01  . ESOPHAGOGASTRODUODENOSCOPY N/A 03/11/2016   Procedure: ESOPHAGOGASTRODUODENOSCOPY (EGD);  Surgeon: Rogene Houston, MD;  Location: [facility];  Service: Endoscopy;  Laterality: N/A;  12:00  . FINGER TENDON REPAIR     in middle finger  . KNEE SURGERY     left knee  . NASAL SEPTUM SURGERY      Family History  Problem Relation Age of Onset  .  Coronary artery disease Mother        strong family history of  . Heart attack Mother        dying of (at age 10)  . Diabetes Mother   . Diabetes Sister   . Diabetes Brother   . Pancreatitis Brother   . Healthy Daughter   . Hyperlipidemia Daughter   . Heart disease Brother   . Hypertension Brother   . Hyperlipidemia Brother     Social History   Socioeconomic History  . Marital status: Married    Spouse name: Not on file  . Number of children: Not on file  . Years of education: Not on file  . Highest education level: Not on file  Occupational  History  . Not on file  Tobacco Use  . Smoking status: Never Smoker  . Smokeless tobacco: Never Used  Substance and Sexual Activity  . Alcohol use: Yes    Comment: Patient may drink a beer or glass of wine a couple times a month  . Drug use: No  . Sexual activity: Not on file  Other Topics Concern  . Not on file  Social History Narrative   The patient lives in [place] with his wife.  He is     disabled secondary to coronary artery disease and pancreatitis.  He does     not smoke, he does not drink alcohol.  There is no drug use.    Social Determinants of Health   Financial Resource Strain:   . Difficulty of Paying Living Expenses: Not on file  Food Insecurity:   . Worried About Charity fundraiser in the Last Year: Not on file  . Ran Out of Food in the Last Year: Not on file  Transportation Needs:   . Lack of Transportation (Medical): Not on file  . Lack of Transportation (Non-Medical): Not on file  Physical Activity:   . Days of Exercise per Week: Not on file  . Minutes of Exercise per Session: Not on file  Stress:   . Feeling of Stress : Not on file  Social Connections:   . Frequency of Communication with Friends and Family: Not on file  . Frequency of Social Gatherings with Friends and Family: Not on file  . Attends Religious Services: Not on file  . Active Member of Clubs or Organizations: Not on file  . Attends Archivist Meetings: Not on file  . Marital Status: Not on file  Intimate Partner Violence:   . Fear of Current or Ex-Partner: Not on file  . Emotionally Abused: Not on file  . Physically Abused: Not on file  . Sexually Abused: Not on file    ROS Review of Systems  Constitutional: Negative for chills and fever.  HENT: Negative for congestion and sore throat.   Eyes: Negative for pain and discharge.  Respiratory: Negative for cough and shortness of breath.   Cardiovascular: Negative for chest pain and palpitations.  Gastrointestinal:  Negative for constipation, diarrhea, nausea and vomiting.  Endocrine: Negative for polydipsia and polyuria.  Genitourinary: Negative for dysuria and hematuria.  Musculoskeletal: Positive for arthralgias, back pain and neck pain. Negative for neck stiffness.  Skin: Negative for rash.  Neurological: Positive for numbness (B/l LE, intermittent). Negative for dizziness, weakness and headaches.  Psychiatric/Behavioral: Negative for agitation and behavioral problems.    Objective:   Today's Vitals: BP 136/87 (BP Location: Right Arm, Patient Position: Sitting)   Pulse 72   Ht 5' 9.5" (1.765  m)   Wt 168 lb (76.2 kg)   SpO2 98%   BMI 24.45 kg/m   Physical Exam Vitals reviewed.  Constitutional:      General: He is not in acute distress.    Appearance: He is not diaphoretic.  HENT:     Head: Normocephalic and atraumatic.     Nose: Nose normal.     Mouth/Throat:     Mouth: Mucous membranes are moist.  Eyes:     General: No scleral icterus.    Extraocular Movements: Extraocular movements intact.     Pupils: Pupils are equal, round, and reactive to light.  Cardiovascular:     Rate and Rhythm: Normal rate and regular rhythm.     Pulses: Normal pulses.     Heart sounds: Normal heart sounds. No murmur heard.   Pulmonary:     Breath sounds: Normal breath sounds. No wheezing or rales.  Abdominal:     Palpations: Abdomen is soft.     Tenderness: There is no abdominal tenderness.  Musculoskeletal:     Cervical back: Neck supple. No tenderness.     Right lower leg: No edema.     Left lower leg: No edema.  Skin:    General: Skin is warm.     Findings: No rash.  Neurological:     General: No focal deficit present.     Mental Status: He is alert and oriented to person, place, and time.     Sensory: No sensory deficit.     Motor: No weakness.  Psychiatric:        Mood and Affect: Mood normal.        Behavior: Behavior normal.     Assessment & Plan:    Encounter to establish  care Care established Previous chart reviewed History and medications reviewed with the patient  Coronary artery disease of native artery of native heart with stable angina pectoris (HCC) On Plavix and B-blocker Nitroglycerin PRN for chest pain Follows up with Cardiologist  GERD (gastroesophageal reflux disease) On Lansoprazole  Type 2 diabetes mellitus with diabetic neuropathy, with long-term current use of insulin (HCC) HbA1C: 7.9 (05/2020) On Novolin 40 U BID and Metformin, f/u with Dr Dorris Fetch Advised to follow diabetic diet Diabetic foot exam: Today Diabetic eye exam: Advised to follow up with Ophthalmology for diabetic eye exam   Dyslipidemia Continue Lovaza Intolerant to statin and Repatha  Diabetic peripheral neuropathy (HCC) Long-standing, severe b/l LE pain On Gabapentin 400 mg BID and Norco PRN SSRI and Valium for chronic pain and anxiety related to neuropathy  H/O chronic pancreatitis Related to previous episode of 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 Laural Golden  Essential hypertension, benign BP Readings from Last 1 Encounters:  06/23/20 136/87   Well-controlled with HCTZ and Metoprolol (mainly for CAD) Counseled for compliance with the medications Advised DASH diet and moderate exercise/walking, at least 150 mins/week   Polycythemia vera ([place]) JAK2 negative Gets venipuncture every 3-4 months F/u Heme/Onc.  Cervical spinal stenosis F/u with Spine surgery On Gabapentin and SSRI for chronic neuropathy pain    Outpatient Encounter Medications as of 06/23/2020  Medication Sig  . Cholecalciferol (VITAMIN D-1000 MAX ST) 1000 units tablet Take 4,000 Units by mouth daily.  . clopidogrel (PLAVIX) 75 MG tablet Take 1 tablet (75 mg total) by mouth daily.  . diazepam (VALIUM) 10 MG tablet Take 10 mg by mouth at bedtime as needed for anxiety or sleep. As Needed  .  gabapentin (NEURONTIN) 100 MG  capsule Take 100 mg by mouth 2 (two) times daily.  Marland Kitchen gabapentin (NEURONTIN) 300 MG capsule Take 1 capsule by mouth 2 (two) times daily.  . hydrochlorothiazide (HYDRODIURIL) 25 MG tablet Take 25 mg by mouth 2 (two) times daily. Pt takes 1 pill each morning and 1/2 a pill each evening  . HYDROcodone-acetaminophen (NORCO) 10-325 MG tablet Take 1 tablet every 4 (four) hours as needed by mouth for severe pain.   Marland Kitchen insulin NPH-regular Human (NOVOLIN 70/30) (70-30) 100 UNIT/ML injection Inject 40 Units into the skin 2 (two) times daily before a meal. When glucose is above 80 and eating  . lansoprazole (PREVACID) 30 MG capsule Take 1 capsule (30 mg total) by mouth 2 (two) times daily before a meal. (Patient taking differently: Take 30 mg by mouth 2 (two) times daily before a meal. Pt states he takes 4 pills a week)  . metFORMIN (GLUCOPHAGE XR) 500 MG 24 hr tablet Take 1 tablet (500 mg total) by mouth daily with breakfast.  . metoprolol tartrate (LOPRESSOR) 100 MG tablet Take 25 mg by mouth 2 (two) times daily.   . nitroGLYCERIN (NITROSTAT) 0.4 MG SL tablet Place 0.4 mg under the tongue every 5 (five) minutes as needed for chest pain. Reported on 02/26/2016  . NON FORMULARY Domperidone 10 mg Patient takes 2 by mouth daily  . omega-3 acid ethyl esters (LOVAZA) 1 g capsule Take 2 capsules (2 g total) by mouth 2 (two) times daily.  . ondansetron (ZOFRAN) 4 MG tablet Take 4 mg by mouth every 8 (eight) hours as needed for nausea or vomiting.  . Pancrelipase, Lip-Prot-Amyl, 6000 units CPEP Take by mouth as needed.   Marland Kitchen PARoxetine (PAXIL) 10 MG tablet Take 10 mg by mouth daily.    . promethazine (PHENERGAN) 25 MG tablet Take 25 mg by mouth every 8 (eight) hours as needed for nausea or vomiting.   . sildenafil (VIAGRA) 50 MG tablet Take 1 tablet (50 mg total) by mouth daily as needed for erectile dysfunction.  Marland Kitchen tetrahydrozoline 0.05 % ophthalmic solution Apply to eye.   No facility-administered encounter medications  on file as of 06/23/2020.    Follow-up: No follow-ups on file.   Lindell Spar, MD

## 2020-06-24 NOTE — Assessment & Plan Note (Addendum)
F/u with Spine surgery On Gabapentin and SSRI for chronic neuropathy pain

## 2020-06-27 DIAGNOSIS — Z7689 Persons encountering health services in other specified circumstances: Secondary | ICD-10-CM | POA: Diagnosis not present

## 2020-06-28 LAB — CMP14+EGFR
ALT: 15 IU/L (ref 0–44)
AST: 26 IU/L (ref 0–40)
Albumin/Globulin Ratio: 1.8 (ref 1.2–2.2)
Albumin: 4.6 g/dL (ref 3.8–4.9)
Alkaline Phosphatase: 56 IU/L (ref 44–121)
BUN/Creatinine Ratio: 15 (ref 9–20)
BUN: 21 mg/dL (ref 6–24)
Bilirubin Total: 0.6 mg/dL (ref 0.0–1.2)
CO2: 25 mmol/L (ref 20–29)
Calcium: 9.6 mg/dL (ref 8.7–10.2)
Chloride: 96 mmol/L (ref 96–106)
Creatinine, Ser: 1.44 mg/dL — ABNORMAL HIGH (ref 0.76–1.27)
GFR calc Af Amer: 62 mL/min/{1.73_m2} (ref >59)
GFR calc non Af Amer: 54 mL/min/{1.73_m2} — ABNORMAL LOW (ref >59)
Globulin, Total: 2.5 g/dL (ref 1.5–4.5)
Glucose: 143 mg/dL — ABNORMAL HIGH (ref 65–99)
Potassium: 4.6 mmol/L (ref 3.5–5.2)
Sodium: 135 mmol/L (ref 134–144)
Total Protein: 7.1 g/dL (ref 6.0–8.5)

## 2020-06-28 LAB — CBC WITH DIFFERENTIAL/PLATELET
Basophils Absolute: 0 10*3/uL (ref 0.0–0.2)
Basos: 1 %
EOS (ABSOLUTE): 0.2 10*3/uL (ref 0.0–0.4)
Eos: 3 %
Hematocrit: 42.8 % (ref 37.5–51.0)
Hemoglobin: 13.6 g/dL (ref 13.0–17.7)
Immature Grans (Abs): 0 10*3/uL (ref 0.0–0.1)
Immature Granulocytes: 0 %
Lymphocytes Absolute: 1.2 10*3/uL (ref 0.7–3.1)
Lymphs: 17 %
MCH: 23.7 pg — ABNORMAL LOW (ref 26.6–33.0)
MCHC: 31.8 g/dL (ref 31.5–35.7)
MCV: 75 fL — ABNORMAL LOW (ref 79–97)
Monocytes Absolute: 0.6 10*3/uL (ref 0.1–0.9)
Monocytes: 9 %
Neutrophils Absolute: 4.6 10*3/uL (ref 1.4–7.0)
Neutrophils: 70 %
Platelets: 297 10*3/uL (ref 150–450)
RBC: 5.73 x10E6/uL (ref 4.14–5.80)
RDW: 18.3 % — ABNORMAL HIGH (ref 11.6–15.4)
WBC: 6.6 10*3/uL (ref 3.4–10.8)

## 2020-06-28 LAB — LIPID PANEL
Chol/HDL Ratio: 7.9 ratio — ABNORMAL HIGH (ref 0.0–5.0)
Cholesterol, Total: 332 mg/dL — ABNORMAL HIGH (ref 100–199)
HDL: 42 mg/dL (ref >39)
LDL Chol Calc (NIH): 156 mg/dL — ABNORMAL HIGH (ref 0–99)
Triglycerides: 672 mg/dL (ref 0–149)
VLDL Cholesterol Cal: 134 mg/dL — ABNORMAL HIGH (ref 5–40)

## 2020-06-28 LAB — VITAMIN D 25 HYDROXY (VIT D DEFICIENCY, FRACTURES): Vit D, 25-Hydroxy: 23.8 ng/mL — ABNORMAL LOW (ref 30.0–100.0)

## 2020-06-28 LAB — PSA: Prostate Specific Ag, Serum: 1.9 ng/mL (ref 0.0–4.0)

## 2020-06-30 ENCOUNTER — Other Ambulatory Visit: Payer: Self-pay | Admitting: Internal Medicine

## 2020-06-30 DIAGNOSIS — M4802 Spinal stenosis, cervical region: Secondary | ICD-10-CM | POA: Diagnosis not present

## 2020-06-30 DIAGNOSIS — S43431D Superior glenoid labrum lesion of right shoulder, subsequent encounter: Secondary | ICD-10-CM | POA: Diagnosis not present

## 2020-06-30 DIAGNOSIS — M25531 Pain in right wrist: Secondary | ICD-10-CM | POA: Diagnosis not present

## 2020-06-30 DIAGNOSIS — M5410 Radiculopathy, site unspecified: Secondary | ICD-10-CM | POA: Diagnosis not present

## 2020-06-30 DIAGNOSIS — E782 Mixed hyperlipidemia: Secondary | ICD-10-CM

## 2020-06-30 MED ORDER — EZETIMIBE 10 MG PO TABS: 10.0000 mg | ORAL_TABLET | Freq: Every day | ORAL | 1 refills | Status: DC

## 2020-08-07 ENCOUNTER — Other Ambulatory Visit: Payer: Self-pay | Admitting: *Deleted

## 2020-08-07 ENCOUNTER — Telehealth: Payer: Self-pay

## 2020-08-07 MED ORDER — GABAPENTIN 300 MG PO CAPS
300.0000 mg | ORAL_CAPSULE | Freq: Two times a day (BID) | ORAL | 0 refills | Status: DC
Start: 2020-08-07 — End: 2020-09-23

## 2020-08-07 NOTE — Telephone Encounter (Signed)
Medication sent to [facility]

## 2020-08-07 NOTE — Telephone Encounter (Signed)
Pt is in need of gabapentin--pt taken 400mg 

## 2020-09-17 ENCOUNTER — Ambulatory Visit: Payer: PPO | Admitting: "Endocrinology

## 2020-09-23 ENCOUNTER — Encounter: Payer: Self-pay | Admitting: Internal Medicine

## 2020-09-23 ENCOUNTER — Encounter: Payer: Self-pay | Admitting: "Endocrinology

## 2020-09-23 ENCOUNTER — Telehealth: Payer: Self-pay | Admitting: "Endocrinology

## 2020-09-23 ENCOUNTER — Ambulatory Visit (INDEPENDENT_AMBULATORY_CARE_PROVIDER_SITE_OTHER): Payer: PPO | Admitting: Internal Medicine

## 2020-09-23 ENCOUNTER — Ambulatory Visit: Payer: PPO | Admitting: "Endocrinology

## 2020-09-23 ENCOUNTER — Other Ambulatory Visit: Payer: Self-pay

## 2020-09-23 VITALS — BP 134/87 | HR 68 | Temp 97.6°F | Ht 69.5 in | Wt 172.0 lb

## 2020-09-23 VITALS — BP 140/84 | HR 64 | Ht 69.5 in | Wt 173.0 lb

## 2020-09-23 DIAGNOSIS — I10 Essential (primary) hypertension: Secondary | ICD-10-CM

## 2020-09-23 DIAGNOSIS — E0865 Diabetes mellitus due to underlying condition with hyperglycemia: Secondary | ICD-10-CM

## 2020-09-23 DIAGNOSIS — Z794 Long term (current) use of insulin: Secondary | ICD-10-CM

## 2020-09-23 DIAGNOSIS — F339 Major depressive disorder, recurrent, unspecified: Secondary | ICD-10-CM | POA: Insufficient documentation

## 2020-09-23 DIAGNOSIS — K8681 Exocrine pancreatic insufficiency: Secondary | ICD-10-CM | POA: Diagnosis not present

## 2020-09-23 DIAGNOSIS — E782 Mixed hyperlipidemia: Secondary | ICD-10-CM | POA: Diagnosis not present

## 2020-09-23 DIAGNOSIS — Z789 Other specified health status: Secondary | ICD-10-CM | POA: Diagnosis not present

## 2020-09-23 DIAGNOSIS — E1142 Type 2 diabetes mellitus with diabetic polyneuropathy: Secondary | ICD-10-CM | POA: Diagnosis not present

## 2020-09-23 DIAGNOSIS — K8689 Other specified diseases of pancreas: Secondary | ICD-10-CM

## 2020-09-23 DIAGNOSIS — E1140 Type 2 diabetes mellitus with diabetic neuropathy, unspecified: Secondary | ICD-10-CM

## 2020-09-23 DIAGNOSIS — Z8719 Personal history of other diseases of the digestive system: Secondary | ICD-10-CM

## 2020-09-23 DIAGNOSIS — IMO0002 Reserved for concepts with insufficient information to code with codable children: Secondary | ICD-10-CM

## 2020-09-23 DIAGNOSIS — E785 Hyperlipidemia, unspecified: Secondary | ICD-10-CM | POA: Diagnosis not present

## 2020-09-23 LAB — HEMOGLOBIN A1C: Hemoglobin A1C: 8.2

## 2020-09-23 LAB — MICROALBUMIN, URINE: Microalb, Ur: 150

## 2020-09-23 MED ORDER — PAROXETINE HCL 10 MG PO TABS: 10.0000 mg | ORAL_TABLET | Freq: Every day | ORAL | 1 refills | Status: DC

## 2020-09-23 MED ORDER — HYDROCHLOROTHIAZIDE 25 MG PO TABS: 25.0000 mg | ORAL_TABLET | Freq: Two times a day (BID) | ORAL | 2 refills | Status: DC

## 2020-09-23 MED ORDER — GABAPENTIN 400 MG PO CAPS: 400.0000 mg | ORAL_CAPSULE | Freq: Two times a day (BID) | ORAL | 3 refills | Status: DC

## 2020-09-23 NOTE — Progress Notes (Signed)
Established Patient Office Visit  Subjective:  Patient ID: [name], male    DOB: 1961-05-06  Age: 60 y.o. MRN: [id number]  CC:  Chief Complaint  Patient presents with  . Follow-up    HPI [name] is a 60 year old male with past medical history of CAD status post CABG, hypertension, uncontrolled diabetes mellitus-on insulin, severe diabetic neuropathy, chronic pancreatitis, polycythemia vera, hyperlipidemia, cervical spinal stenosis and chronic back pain who presents for follow up of his chronic medical conditions.  BP is well-controlled. Takes medications regularly. Patient denies headache, dizziness, chest pain, dyspnea or palpitations. Discussed about starting ACEi/ARB for his HTN and DM nephropathy, prefers to wait for now.  He has been having severe neuropathic pain recently as he had run out of Gabapentin 100 mg. He is currently taking Gabapentin 300 mg BID and Percocet PRN about 1.5 tablets in a day. He thinks he would need Percocet less if he has his Gabapentin 400 mg BID in total.  He requests refill of Paroxetine for his anxiety.   Past Medical History:  Diagnosis Date  . Anemia   . Bypass graft stenosis ([place])    '06  . Cervical spinal stenosis 05/06/2020   C5-6 level  . Chronic gastritis   . Coronary artery disease    Patent Stent to Circ.  Patent coronary bypass grafts with a free radial graft to PDA and LIMA to the  diag.  . Diabetes mellitus   . Diabetic peripheral neuropathy ([place]) 05/06/2020  . Dyslipidemia    Poorly controlled, probably related to his DM (Some of this is secondary to his inability to afford medications).  . ED (erectile dysfunction)   . Esophageal yeast infection ([place])   . Metaplasia of esophagus   . Pancreatitis chronic    Pseudocyst  . Polycythemia vera(238.4) 04/26/2012    Past Surgical History:  Procedure Laterality Date  . APPENDECTOMY    . CARDIAC CATHETERIZATION    . CHOLECYSTECTOMY    . COLONOSCOPY  07/31/1985  .  COLONOSCOPY N/A 03/11/2016   Procedure: COLONOSCOPY;  Surgeon: Rogene Houston, MD;  Location: [facility];  Service: Endoscopy;  Laterality: N/A;  . CORONARY ANGIOPLASTY WITH STENT PLACEMENT  2006  . CORONARY ARTERY BYPASS GRAFT  11/24/2004  . ESOPHAGOGASTRODUODENOSCOPY  09/19/01  . ESOPHAGOGASTRODUODENOSCOPY N/A 03/11/2016   Procedure: ESOPHAGOGASTRODUODENOSCOPY (EGD);  Surgeon: Rogene Houston, MD;  Location: [facility];  Service: Endoscopy;  Laterality: N/A;  12:00  . FINGER TENDON REPAIR     in middle finger  . KNEE SURGERY     left knee  . NASAL SEPTUM SURGERY      Family History  Problem Relation Age of Onset  . Coronary artery disease Mother        strong family history of  . Heart attack Mother        dying of (at age 65)  . Diabetes Mother   . Diabetes Sister   . Diabetes Brother   . Pancreatitis Brother   . Healthy Daughter   . Hyperlipidemia Daughter   . Heart disease Brother   . Hypertension Brother   . Hyperlipidemia Brother     Social History   Socioeconomic History  . Marital status: Married    Spouse name: Not on file  . Number of children: Not on file  . Years of education: Not on file  . Highest education level: Not on file  Occupational History  . Not on file  Tobacco Use  .  Smoking status: Never Smoker  . Smokeless tobacco: Never Used  Substance and Sexual Activity  . Alcohol use: Yes    Comment: Patient may drink a beer or glass of wine a couple times a month  . Drug use: No  . Sexual activity: Not on file  Other Topics Concern  . Not on file  Social History Narrative   The patient lives in [place] with his wife.  He is     disabled secondary to coronary artery disease and pancreatitis.  He does     not smoke, he does not drink alcohol.  There is no drug use.    Social Determinants of Health   Financial Resource Strain: Not on file  Food Insecurity: Not on file  Transportation Needs: Not on file  Physical Activity: Not on file   Stress: Not on file  Social Connections: Not on file  Intimate Partner Violence: Not on file    Outpatient Medications Prior to Visit  Medication Sig Dispense Refill  . Cholecalciferol 25 MCG (1000 UT) tablet Take 4,000 Units by mouth daily.    . clopidogrel (PLAVIX) 75 MG tablet Take 1 tablet (75 mg total) by mouth daily.    . diazepam (VALIUM) 10 MG tablet Take 10 mg by mouth at bedtime as needed for anxiety or sleep. As Needed    . ezetimibe (ZETIA) 10 MG tablet Take 1 tablet (10 mg total) by mouth daily. 90 tablet 1  . HYDROcodone-acetaminophen (NORCO) 10-325 MG tablet Take 1 tablet every 4 (four) hours as needed by mouth for severe pain.     Marland Kitchen insulin NPH-regular Human (NOVOLIN 70/30) (70-30) 100 UNIT/ML injection Inject 45 Units into the skin 2 (two) times daily before a meal. When glucose is above 80 and eating    . lansoprazole (PREVACID) 30 MG capsule Take 1 capsule (30 mg total) by mouth 2 (two) times daily before a meal. (Patient taking differently: Take 30 mg by mouth 2 (two) times daily before a meal. Pt states he takes 4 pills a week) 60 capsule 3  . metFORMIN (GLUCOPHAGE XR) 500 MG 24 hr tablet Take 1 tablet (500 mg total) by mouth daily with breakfast. 90 tablet 1  . metoprolol tartrate (LOPRESSOR) 100 MG tablet Take 25 mg by mouth 2 (two) times daily.     . nitroGLYCERIN (NITROSTAT) 0.4 MG SL tablet Place 0.4 mg under the tongue every 5 (five) minutes as needed for chest pain. Reported on 02/26/2016    . NON FORMULARY Domperidone 10 mg Patient takes 2 by mouth daily    . omega-3 acid ethyl esters (LOVAZA) 1 g capsule Take 2 capsules (2 g total) by mouth 2 (two) times daily. 360 capsule 2  . ondansetron (ZOFRAN) 4 MG tablet Take 4 mg by mouth every 8 (eight) hours as needed for nausea or vomiting.    . Pancrelipase, Lip-Prot-Amyl, 6000 units CPEP Take by mouth as needed.     . promethazine (PHENERGAN) 25 MG tablet Take 25 mg by mouth every 8 (eight) hours as needed for nausea or  vomiting.    . sildenafil (VIAGRA) 50 MG tablet Take 1 tablet (50 mg total) by mouth daily as needed for erectile dysfunction. 10 tablet 3  . gabapentin (NEURONTIN) 100 MG capsule Take 100 mg by mouth 2 (two) times daily.    Marland Kitchen gabapentin (NEURONTIN) 300 MG capsule Take 1 capsule (300 mg total) by mouth 2 (two) times daily. 60 capsule 0  . hydrochlorothiazide (HYDRODIURIL) 25  MG tablet Take 25 mg by mouth 2 (two) times daily. Pt takes 1 pill each morning and 1/2 a pill each evening    . PARoxetine (PAXIL) 10 MG tablet Take 10 mg by mouth daily.       No facility-administered medications prior to visit.    Allergies  Allergen Reactions  . Metoclopramide Hcl Hives    Patient became very spastic  . Enalapril Cough  . Iodinated Diagnostic Agents     Rash, nausea and flushing  . Iohexol      Desc: HIVES,SOB NEEDS PREP.MEDS   . Propofol Other (See Comments)    Pancreatic attack.  . Sulfonamide Derivatives     Large hives  . Penicillins Rash    Has patient had a PCN reaction causing immediate rash, facial/tongue/throat swelling, SOB or lightheadedness with hypotension: Yes Has patient had a PCN reaction causing severe rash involving mucus membranes or skin necrosis: No Has patient had a PCN reaction that required hospitalization No Has patient had a PCN reaction occurring within the last 10 years: No. If all of the above answers are "NO", then may proceed with Cephalosporin use.   . Sulfa Antibiotics Rash    Other Reaction: Not Assessed    ROS Review of Systems  Constitutional: Negative for chills and fever.  HENT: Negative for congestion and sore throat.   Eyes: Negative for pain and discharge.  Respiratory: Negative for cough and shortness of breath.   Cardiovascular: Negative for chest pain and palpitations.  Gastrointestinal: Negative for constipation, diarrhea, nausea and vomiting.  Endocrine: Negative for polydipsia and polyuria.  Genitourinary: Negative for dysuria and  hematuria.  Musculoskeletal: Positive for arthralgias, back pain and neck pain. Negative for neck stiffness.  Skin: Negative for rash.  Neurological: Positive for numbness (B/l LE, intermittent). Negative for dizziness, weakness and headaches.  Psychiatric/Behavioral: Negative for agitation and behavioral problems.      Objective:    Physical Exam Vitals reviewed.  Constitutional:      General: He is not in acute distress.    Appearance: He is not diaphoretic.  HENT:     Head: Normocephalic and atraumatic.     Nose: Nose normal.     Mouth/Throat:     Mouth: Mucous membranes are moist.  Eyes:     General: No scleral icterus.    Extraocular Movements: Extraocular movements intact.     Pupils: Pupils are equal, round, and reactive to light.  Cardiovascular:     Rate and Rhythm: Normal rate and regular rhythm.     Pulses: Normal pulses.     Heart sounds: Normal heart sounds. No murmur heard.   Pulmonary:     Breath sounds: Normal breath sounds. No wheezing or rales.  Musculoskeletal:     Cervical back: Neck supple. No tenderness.     Right lower leg: No edema.     Left lower leg: No edema.  Skin:    General: Skin is warm.     Findings: No rash.  Neurological:     General: No focal deficit present.     Mental Status: He is alert and oriented to person, place, and time.     Sensory: No sensory deficit.     Motor: No weakness.  Psychiatric:        Mood and Affect: Mood normal.        Behavior: Behavior normal.     BP 134/87 (BP Location: Right Arm, Patient Position: Sitting, Cuff Size: Normal)   Pulse  68   Temp 97.6 F (36.4 C) (Temporal)   Ht 5' 9.5" (1.765 m)   Wt 172 lb (78 kg)   SpO2 98%   BMI 25.04 kg/m  Wt Readings from Last 3 Encounters:  09/23/20 172 lb (78 kg)  09/23/20 173 lb (78.5 kg)  06/23/20 168 lb (76.2 kg)     Health Maintenance Due  Topic Date Due  . Hepatitis C Screening  Never done  . FOOT EXAM  Never done  . OPHTHALMOLOGY EXAM  Never  done  . URINE MICROALBUMIN  Never done  . HIV Screening  Never done  . TETANUS/TDAP  Never done    There are no preventive care reminders to display for this patient.  Lab Results  Component Value Date   TSH 2.570 05/19/2020   Lab Results  Component Value Date   WBC 6.6 06/27/2020   HGB 13.6 06/27/2020   HCT 42.8 06/27/2020   MCV 75 (L) 06/27/2020   PLT 297 06/27/2020   Lab Results  Component Value Date   NA 135 06/27/2020   K 4.6 06/27/2020   CO2 25 06/27/2020   GLUCOSE 143 (H) 06/27/2020   BUN 21 06/27/2020   CREATININE 1.44 (H) 06/27/2020   BILITOT 0.6 06/27/2020   ALKPHOS 56 06/27/2020   AST 26 06/27/2020   ALT 15 06/27/2020   PROT 7.1 06/27/2020   ALBUMIN 4.6 06/27/2020   CALCIUM 9.6 06/27/2020   ANIONGAP 13 01/08/2018   Lab Results  Component Value Date   CHOL 332 (H) 06/27/2020   Lab Results  Component Value Date   HDL 42 06/27/2020   Lab Results  Component Value Date   LDLCALC 156 (H) 06/27/2020   Lab Results  Component Value Date   TRIG 672 (HH) 06/27/2020   Lab Results  Component Value Date   CHOLHDL 7.9 (H) 06/27/2020   Lab Results  Component Value Date   HGBA1C 8.2 09/23/2020      Assessment & Plan:   Problem List Items Addressed This Visit      Cardiovascular and Mediastinum   Essential hypertension, benign - Primary    BP Readings from Last 1 Encounters:  09/23/20 134/87   Well-controlled with HCTZ and Metoprolol (mainly for CAD) Counseled for compliance with the medications Advised DASH diet and moderate exercise/walking, at least 150 mins/week  Plan to start ACEi/ARB instead of second dose of HCTZ.       Relevant Medications   hydrochlorothiazide (HYDRODIURIL) 25 MG tablet   Other Relevant Orders   CBC with Differential   TSH     Endocrine   Diabetic peripheral neuropathy (HCC)    Long-standing, severe b/l LE pain On Gabapentin 400 mg BID and Norco PRN SSRI and Valium for chronic pain and anxiety related to  neuropathy      Relevant Medications   gabapentin (NEURONTIN) 400 MG capsule   PARoxetine (PAXIL) 10 MG tablet   Type 2 diabetes mellitus with diabetic neuropathy, with long-term current use of insulin (HCC)    HbA1C: 8.2 On Novolin 45 U BID and Metformin, f/u with Dr Dorris Fetch Advised to follow diabetic diet Diabetic eye exam: Advised to follow up with Ophthalmology for diabetic eye exam       Relevant Orders   CMP14+EGFR   Hemoglobin A1c     Other   Dyslipidemia    Continue Lovaza Intolerant to statin and Repatha  May try Praluent, discuss with Cardiology      Relevant Orders  Lipid panel   H/O chronic pancreatitis    Related to previous episode of 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 Laural Golden      Relevant Orders   CMP14+EGFR   Depression, recurrent ([place])   Relevant Medications   PARoxetine (PAXIL) 10 MG tablet   Other Relevant Orders   TSH      Meds ordered this encounter  Medications  . gabapentin (NEURONTIN) 400 MG capsule    Sig: Take 1 capsule (400 mg total) by mouth 2 (two) times daily.    Dispense:  60 capsule    Refill:  3  . hydrochlorothiazide (HYDRODIURIL) 25 MG tablet    Sig: Take 1 tablet (25 mg total) by mouth 2 (two) times daily. Pt takes 1 pill each morning and 1/2 a pill each evening    Dispense:  60 tablet    Refill:  2  . PARoxetine (PAXIL) 10 MG tablet    Sig: Take 1 tablet (10 mg total) by mouth daily.    Dispense:  90 tablet    Refill:  1    Follow-up: Return in about 4 months (around 01/21/2021).    Lindell Spar, MD

## 2020-09-23 NOTE — Assessment & Plan Note (Signed)
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 Laural Golden

## 2020-09-23 NOTE — Telephone Encounter (Signed)
Error

## 2020-09-23 NOTE — Patient Instructions (Addendum)
Please start taking Gabapentin 400 mg twice daily.  Please continue taking other medications as prescribed.  Please follow low carbohydrate and low cholesterol diet as prescribed.  Please get fasting blood tests done before the next visit.

## 2020-09-23 NOTE — Assessment & Plan Note (Signed)
Long-standing, severe b/l LE pain On Gabapentin 400 mg BID and Norco PRN SSRI and Valium for chronic pain and anxiety related to neuropathy

## 2020-09-23 NOTE — Assessment & Plan Note (Signed)
Continue Lovaza Intolerant to statin and Repatha  May try Praluent, discuss with Cardiology

## 2020-09-23 NOTE — Progress Notes (Signed)
09/23/2020, 4:17 PM   Endocrinology follow-up note  Subjective:    Patient ID: [name], male    DOB: 01-Jun-1961.  BEMNET CALTRIDER is being seen in  follow-up after he was seen in consultation for management of currently uncontrolled symptomatic diabetes, hyperlipidemia, exocrine pancreatic insufficiency. PMD:  Lindell Spar, MD.   Past Medical History:  Diagnosis Date  . Anemia   . 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 Cervical spinal stenosis 05/06/2020   C5-6 level  . Chronic gastritis   . Coronary artery disease    Patent Stent to Circ.  Patent coronary bypass grafts with a free radial graft to PDA and LIMA to the  diag.  . Diabetes mellitus   . Diabetic peripheral neuropathy ([place]) 05/06/2020  . Dyslipidemia    Poorly controlled, probably related to his DM (Some of this is secondary to his inability to afford medications).  . ED (erectile dysfunction)   . Esophageal yeast infection ([place])   . Metaplasia of esophagus   . Pancreatitis chronic    Pseudocyst  . Polycythemia vera(238.4) 04/26/2012    Past Surgical History:  Procedure Laterality Date  . APPENDECTOMY    . CARDIAC CATHETERIZATION    . CHOLECYSTECTOMY    . COLONOSCOPY  07/31/1985  . COLONOSCOPY N/A 03/11/2016   Procedure: COLONOSCOPY;  Surgeon: Rogene Houston, MD;  Location: [facility];  Service: Endoscopy;  Laterality: N/A;  . CORONARY ANGIOPLASTY WITH STENT PLACEMENT  2006  . CORONARY ARTERY BYPASS GRAFT  11/24/2004  . ESOPHAGOGASTRODUODENOSCOPY  09/19/01  . ESOPHAGOGASTRODUODENOSCOPY N/A 03/11/2016   Procedure: ESOPHAGOGASTRODUODENOSCOPY (EGD);  Surgeon: Rogene Houston, MD;  Location: [facility];  Service: Endoscopy;  Laterality: N/A;  12:00  . FINGER TENDON REPAIR     in middle finger  . KNEE SURGERY     left knee  . NASAL SEPTUM SURGERY      Social History   Socioeconomic History  . Marital status: Married    Spouse name: Not on file  . Number of children:  Not on file  . Years of education: Not on file  . Highest education level: Not on file  Occupational History  . Not on file  Tobacco Use  . Smoking status: Never Smoker  . Smokeless tobacco: Never Used  Substance and Sexual Activity  . Alcohol use: Yes    Comment: Patient may drink a beer or glass of wine a couple times a month  . Drug use: No  . Sexual activity: Not on file  Other Topics Concern  . Not on file  Social History Narrative   The patient lives in [place] with his wife.  He is     disabled secondary to coronary artery disease and pancreatitis.  He does     not smoke, he does not drink alcohol.  There is no drug use.    Social Determinants of Health   Financial Resource Strain: Not on file  Food Insecurity: Not on file  Transportation Needs: Not on file  Physical Activity: Not on file  Stress: Not on file  Social Connections: Not on file    Family History  Problem Relation Age of Onset  . Coronary artery disease Mother        strong family history of  . Heart attack Mother        dying of (at age 20)  . Diabetes Mother   .  Diabetes Sister   . Diabetes Brother   . Pancreatitis Brother   . Healthy Daughter   . Hyperlipidemia Daughter   . Heart disease Brother   . Hypertension Brother   . Hyperlipidemia Brother     Outpatient Encounter Medications as of 09/23/2020  Medication Sig  . Cholecalciferol 25 MCG (1000 UT) tablet Take 4,000 Units by mouth daily.  . clopidogrel (PLAVIX) 75 MG tablet Take 1 tablet (75 mg total) by mouth daily.  . diazepam (VALIUM) 10 MG tablet Take 10 mg by mouth at bedtime as needed for anxiety or sleep. As Needed  . ezetimibe (ZETIA) 10 MG tablet Take 1 tablet (10 mg total) by mouth daily.  Marland Kitchen HYDROcodone-acetaminophen (NORCO) 10-325 MG tablet Take 1 tablet every 4 (four) hours as needed by mouth for severe pain.   Marland Kitchen insulin NPH-regular Human (NOVOLIN 70/30) (70-30) 100 UNIT/ML injection Inject 45 Units into the skin 2 (two)  times daily before a meal. When glucose is above 80 and eating  . lansoprazole (PREVACID) 30 MG capsule Take 1 capsule (30 mg total) by mouth 2 (two) times daily before a meal. (Patient taking differently: Take 30 mg by mouth 2 (two) times daily before a meal. Pt states he takes 4 pills a week)  . metFORMIN (GLUCOPHAGE XR) 500 MG 24 hr tablet Take 1 tablet (500 mg total) by mouth daily with breakfast.  . metoprolol tartrate (LOPRESSOR) 100 MG tablet Take 25 mg by mouth 2 (two) times daily.   . nitroGLYCERIN (NITROSTAT) 0.4 MG SL tablet Place 0.4 mg under the tongue every 5 (five) minutes as needed for chest pain. Reported on 02/26/2016  . NON FORMULARY Domperidone 10 mg Patient takes 2 by mouth daily  . omega-3 acid ethyl esters (LOVAZA) 1 g capsule Take 2 capsules (2 g total) by mouth 2 (two) times daily.  . ondansetron (ZOFRAN) 4 MG tablet Take 4 mg by mouth every 8 (eight) hours as needed for nausea or vomiting.  . Pancrelipase, Lip-Prot-Amyl, 6000 units CPEP Take by mouth as needed.   . promethazine (PHENERGAN) 25 MG tablet Take 25 mg by mouth every 8 (eight) hours as needed for nausea or vomiting.  . sildenafil (VIAGRA) 50 MG tablet Take 1 tablet (50 mg total) by mouth daily as needed for erectile dysfunction.  . [DISCONTINUED] gabapentin (NEURONTIN) 100 MG capsule Take 100 mg by mouth 2 (two) times daily.  . [DISCONTINUED] gabapentin (NEURONTIN) 300 MG capsule Take 1 capsule (300 mg total) by mouth 2 (two) times daily.  . [DISCONTINUED] hydrochlorothiazide (HYDRODIURIL) 25 MG tablet Take 25 mg by mouth 2 (two) times daily. Pt takes 1 pill each morning and 1/2 a pill each evening  . [DISCONTINUED] PARoxetine (PAXIL) 10 MG tablet Take 10 mg by mouth daily.     No facility-administered encounter medications on file as of 09/23/2020.    ALLERGIES: Allergies  Allergen Reactions  . Metoclopramide Hcl Hives    Patient became very spastic  . Enalapril Cough  . Iodinated Diagnostic Agents      Rash, nausea and flushing  . Iohexol      Desc: HIVES,SOB NEEDS PREP.MEDS   . Propofol Other (See Comments)    Pancreatic attack.  . Sulfonamide Derivatives     Large hives  . Penicillins Rash    Has patient had a PCN reaction causing immediate rash, facial/tongue/throat swelling, SOB or lightheadedness with hypotension: Yes Has patient had a PCN reaction causing severe rash involving mucus membranes or skin necrosis:  No Has patient had a PCN reaction that required hospitalization No Has patient had a PCN reaction occurring within the last 10 years: No. If all of the above answers are "NO", then may proceed with Cephalosporin use.   . Sulfa Antibiotics Rash    Other Reaction: Not Assessed    VACCINATION STATUS:  There is no immunization history on file for this patient.  Diabetes He presents for his follow-up diabetic visit. Diabetes type: Pancreatic diabetes. His disease course has been worsening. There are no hypoglycemic associated symptoms. Pertinent negatives for hypoglycemia include no confusion, headaches, pallor or seizures. Pertinent negatives for diabetes include no chest pain, no fatigue, no polydipsia, no polyphagia, no polyuria and no weakness. There are no hypoglycemic complications. Symptoms are worsening. Diabetic complications include heart disease, impotence and peripheral neuropathy. Risk factors for coronary artery disease include dyslipidemia, diabetes mellitus, family history, male sex, hypertension and sedentary lifestyle. Current diabetic treatments: Is currently on Novolin 70/30 49 units nightly, glipizide 10 mg p.o. daily, Metformin 1000 mg p.o. twice daily. His weight is fluctuating minimally. He is following a generally unhealthy diet. When asked about meal planning, he reported none. He has not had a previous visit with a dietitian. He participates in exercise intermittently. His home blood glucose trend is fluctuating minimally. His breakfast blood glucose range  is generally 140-180 mg/dl. His lunch blood glucose range is generally 140-180 mg/dl. His dinner blood glucose range is generally 140-180 mg/dl. His bedtime blood glucose range is generally 140-180 mg/dl. His overall blood glucose range is 140-180 mg/dl. (He wears a CGM device.  Printouts show 39% time range, 61% above range, no hypoglycemia.  His point-of-care A1c is 8.2%.  On his CGM his average blood glucose is 198.) An ACE inhibitor/angiotensin II receptor blocker is not being taken. Eye exam is current.  Hyperlipidemia This is a chronic problem. The current episode started more than 1 year ago. Recent lipid tests were reviewed and are high. Pertinent negatives include no chest pain, myalgias or shortness of breath. Current antihyperlipidemic treatment includes bile acid sequestrants (He has statin intolerance.). Risk factors for coronary artery disease include dyslipidemia, diabetes mellitus, family history, hypertension, male sex and a sedentary lifestyle.  Hypertension This is a chronic problem. The current episode started more than 1 year ago. The problem is controlled. Pertinent negatives include no chest pain, headaches, neck pain, palpitations or shortness of breath. Risk factors for coronary artery disease include dyslipidemia, diabetes mellitus, family history, male gender and sedentary lifestyle. Past treatments include diuretics and beta blockers. Hypertensive end-organ damage includes CAD/MI.     Review of Systems  Constitutional: Negative for chills, fatigue, fever and unexpected weight change.  HENT: Negative for dental problem, mouth sores and trouble swallowing.   Eyes: Negative for visual disturbance.  Respiratory: Negative for cough, choking, chest tightness, shortness of breath and wheezing.   Cardiovascular: Negative for chest pain, palpitations and leg swelling.  Gastrointestinal: Negative for abdominal distention, abdominal pain, constipation, diarrhea, nausea and vomiting.   Endocrine: Negative for polydipsia, polyphagia and polyuria.  Genitourinary: Positive for impotence. Negative for dysuria, flank pain, hematuria and urgency.  Musculoskeletal: Negative for back pain, gait problem, myalgias and neck pain.  Skin: Negative for pallor, rash and wound.  Neurological: Negative for seizures, syncope, weakness, numbness and headaches.  Psychiatric/Behavioral: Negative for confusion and dysphoric mood.    Objective:    Vitals with BMI 09/23/2020 09/23/2020 06/23/2020  Height 5' 9.5" 5' 9.5" -  Weight 172 lbs 173 lbs -  BMI 85.46 27.03 -  Systolic 500 938 182  Diastolic 87 84 87  Pulse 68 64 -    BP 140/84   Pulse 64   Ht 5' 9.5" (1.765 m)   Wt 173 lb (78.5 kg)   BMI 25.18 kg/m   Wt Readings from Last 3 Encounters:  09/23/20 172 lb (78 kg)  09/23/20 173 lb (78.5 kg)  06/23/20 168 lb (76.2 kg)     Physical Exam Constitutional:      General: He is not in acute distress.    Appearance: He is well-developed.  HENT:     Head: Normocephalic and atraumatic.  Neck:     Thyroid: No thyromegaly.     Trachea: No tracheal deviation.  Cardiovascular:     Rate and Rhythm: Normal rate.     Pulses:          Dorsalis pedis pulses are 1+ on the right side and 1+ on the left side.       Posterior tibial pulses are 1+ on the right side and 1+ on the left side.     Heart sounds: Normal heart sounds, S1 normal and S2 normal. No murmur heard. No gallop.   Pulmonary:     Effort: No respiratory distress.     Breath sounds: Normal breath sounds. No wheezing.  Abdominal:     General: Bowel sounds are normal. There is no distension.     Palpations: Abdomen is soft.     Tenderness: There is no abdominal tenderness. There is no guarding.  Musculoskeletal:     Right shoulder: No swelling or deformity.     Cervical back: Normal range of motion and neck supple.  Skin:    General: Skin is warm and dry.     Findings: No rash.     Nails: There is no clubbing.   Neurological:     Mental Status: He is alert and oriented to person, place, and time.     Cranial Nerves: No cranial nerve deficit.     Sensory: No sensory deficit.     Gait: Gait normal.     Deep Tendon Reflexes: Reflexes are normal and symmetric.  Psychiatric:        Speech: Speech normal.        Behavior: Behavior normal. Behavior is cooperative.        Thought Content: Thought content normal.        Judgment: Judgment normal.    CMP ( most recent) CMP     Component Value Date/Time   NA 135 06/27/2020 1500   K 4.6 06/27/2020 1500   CL 96 06/27/2020 1500   CO2 25 06/27/2020 1500   GLUCOSE 143 (H) 06/27/2020 1500   GLUCOSE 303 (H) 08/27/2018 0403   BUN 21 06/27/2020 1500   CREATININE 1.44 (H) 06/27/2020 1500   CREATININE 1.27 07/08/2017 1343   CALCIUM 9.6 06/27/2020 1500   PROT 7.1 06/27/2020 1500   ALBUMIN 4.6 06/27/2020 1500   AST 26 06/27/2020 1500   ALT 15 06/27/2020 1500   ALKPHOS 56 06/27/2020 1500   BILITOT 0.6 06/27/2020 1500   GFRNONAA 54 (L) 06/27/2020 1500   GFRAA 62 06/27/2020 1500     Diabetic Labs (most recent): Lab Results  Component Value Date   HGBA1C 8.2 09/23/2020   HGBA1C 7.9 (A) 05/29/2020   HGBA1C 8.6 (H) 05/19/2020     Lipid Panel ( most recent) Lipid Panel     Component Value Date/Time   CHOL 332 (  H) 06/27/2020 1500   TRIG 672 (HH) 06/27/2020 1500   HDL 42 06/27/2020 1500   CHOLHDL 7.9 (H) 06/27/2020 1500   CHOLHDL 6.9 (H) 08/19/2016 1259   VLDL NOT CALC 08/19/2016 1259   LDLCALC 156 (H) 06/27/2020 1500   LDLDIRECT 110 08/19/2016 1259   LABVLDL 134 (H) 06/27/2020 1500       Assessment & Plan:   1.  Uncontrolled diabetes due to pancreatic insufficiency , CKD.   - LANDIS VARON has currently uncontrolled symptomatic diabetes as a result of pancreatic insufficiency 60 years of age,  with most recent A1c of 7.9 %. Recent labs reviewed.  His medical history is long and complicated. He wears a CGM device.  Printouts show 39%  time range, 61% above range, no hypoglycemia.  His point-of-care A1c is 8.2%.  On his CGM his average blood glucose is 198. - I had a long discussion with him about the pathology behind his diabetes and its complications. -his diabetes is complicated by coronary artery disease which required intervention, ED, peripheral neuropathy and he remains at a high risk for more acute and chronic complications which include CAD, CVA, CKD, retinopathy, and neuropathy. These are all discussed in detail with him.  - I have counseled him on diet  and weight management  by adopting a carbohydrate restricted/protein rich diet. Patient is encouraged to switch to  unprocessed or minimally processed     complex starch and increased protein intake (animal or plant source), fruits, and vegetables. -  he is advised to stick to a routine mealtimes to eat 3 meals  a day and avoid unnecessary snacks ( to snack only to correct hypoglycemia).  - he acknowledges that there is a room for improvement in his food and drink choices. - Suggestion is made for him to avoid simple carbohydrates  from his diet including Cakes, Sweet Desserts, Ice Cream, Soda (diet and regular), Sweet Tea, Candies, Chips, Cookies, Store Bought Juices, Alcohol in Excess of  1-2 drinks a day, Artificial Sweeteners,  Coffee Creamer, and "Sugar-free" Products, Lemonade. This will help patient to have more stable blood glucose profile and potentially avoid unintended weight gain.  - he will be scheduled with Jearld Fenton, RDN, CDE for diabetes education.  - I have approached him with the following individualized plan to manage  his diabetes and patient agrees:   -In light of his diabetes being due to his pancreatic insufficiency, he will be treated with multiple daily injections of insulin. -He has done reasonably well with premixed insulin twice a day.  However, he will need a higher dose of insulin to achieve control of diabetes to target.    -I  discussed and increase his Novolin 70/30 to 45 units twice a day-daily with breakfast and supper  for premeal blood glucose readings above 90 mg per DL, associated with documenting glucose profile at least 4 times a day before meals and at bedtime using his CGM.  - he is warned not to take insulin without proper monitoring per orders.  - he is encouraged to call clinic for blood glucose levels less than 70 or above 200 mg /dl. -He has tolerated and benefitted from low-dose Metformin.  He is advised to continue Metformin 500 mg XR p.o. daily after breakfast.  -He will be considered for insulin analogs utilizing Lantus and NovoLog if necessary on next visit. - he is not a suitable candidate for incretin therapy.    - Specific targets for  A1c;  LDL, HDL,  and Triglycerides were discussed with the patient.  2) Blood Pressure /Hypertension: His blood pressure is controlled to target.  He will not tolerate any additional medications with ACE inhibitors or ARB. he is advised to continue his current medications including hydrochlorothiazide, and metoprolol. His urine microalbumin to creatinine ratio is abnormal.   3) Lipids/Hyperlipidemia:   Review of his recent lipid panel showed  controlled triglycerides are 900+, most recently 672.  He does not tolerate statins.  He is on Lovaza 2 g p.o. twice daily, advised to continue.  He admits to dietary indiscretion.  He is also advised to avoid butter and fried food.      4)  Weight/Diet:  Body mass index is 25.18 kg/m.  -     he is not a candidate for weight loss. I discussed with him the fact that loss of 5 - 10% of his  current body weight will have the most impact on his diabetes management.  Exercise, and detailed carbohydrates information provided  -  detailed on discharge instructions.  5) Chronic Care/Health Maintenance:  -he  Is not  on ACEI/ARB and Statin medications and  is encouraged to initiate and continue to follow up with Ophthalmology,  Dentist,  Podiatrist at least yearly or according to recommendations, and advised to   stay away from smoking. I have recommended yearly flu vaccine and pneumonia vaccine at least every 5 years; moderate intensity exercise for up to 150 minutes weekly; and  sleep for at least 7 hours a day.  6) pancreatic insufficiency -He is advised to continue his enzymatic replacements currently pancrelipase 6000 units with meals and snacks.     - he is  advised to maintain close follow up with Lindell Spar, MD for primary care needs, as well as his other providers for optimal and coordinated care.   - Time spent on this patient care encounter:  45 min, of which > 50% was spent in  counseling and the rest reviewing his blood glucose logs , discussing his hypoglycemia and hyperglycemia episodes, reviewing his current and  previous labs / studies  ( including abstraction from other facilities) and medications  doses and developing a  long term treatment plan and documenting his care.   Please refer to Patient Instructions for Blood Glucose Monitoring and Insulin/Medications Dosing Guide"  in media tab for additional information. Please  also refer to " Patient Self Inventory" in the Media  tab for reviewed elements of pertinent patient history.  Maurice March participated in the discussions, expressed understanding, and voiced agreement with the above plans.  All questions were answered to his satisfaction. he is encouraged to contact clinic should he have any questions or concerns prior to his return visit.   Follow up plan: - Return in about 4 months (around 01/21/2021) for Bring Meter and Logs- A1c in Office, ABI in Office [place].  Glade Lloyd, MD [facility] [address] [place], [place] [address] Phone: [phone]  Fax: [phone]    09/23/2020, 4:17 PM  This note was partially dictated with voice recognition software. Similar sounding words can  be transcribed inadequately or may not  be corrected upon review.

## 2020-09-23 NOTE — Assessment & Plan Note (Signed)
HbA1C: 8.2 On Novolin 45 U BID and Metformin, f/u with Dr Dorris Fetch Advised to follow diabetic diet Diabetic eye exam: Advised to follow up with Ophthalmology for diabetic eye exam

## 2020-09-23 NOTE — Assessment & Plan Note (Signed)
BP Readings from Last 1 Encounters:  09/23/20 134/87   Well-controlled with HCTZ and Metoprolol (mainly for CAD) Counseled for compliance with the medications Advised DASH diet and moderate exercise/walking, at least 150 mins/week  Plan to start ACEi/ARB instead of second dose of HCTZ.

## 2020-09-23 NOTE — Patient Instructions (Signed)

## 2020-10-09 ENCOUNTER — Encounter (INDEPENDENT_AMBULATORY_CARE_PROVIDER_SITE_OTHER): Payer: Self-pay | Admitting: *Deleted

## 2020-10-09 ENCOUNTER — Other Ambulatory Visit: Payer: Self-pay

## 2020-10-09 ENCOUNTER — Ambulatory Visit (INDEPENDENT_AMBULATORY_CARE_PROVIDER_SITE_OTHER): Payer: PPO

## 2020-10-09 VITALS — Ht 69.5 in | Wt 172.0 lb

## 2020-10-09 DIAGNOSIS — Z1211 Encounter for screening for malignant neoplasm of colon: Secondary | ICD-10-CM

## 2020-10-09 DIAGNOSIS — Z0000 Encounter for general adult medical examination without abnormal findings: Secondary | ICD-10-CM

## 2020-10-09 DIAGNOSIS — Z1159 Encounter for screening for other viral diseases: Secondary | ICD-10-CM | POA: Diagnosis not present

## 2020-10-09 NOTE — Progress Notes (Signed)
Subjective:   [name] is a 60 y.o. male who presents for an Initial Medicare Annual Wellness Visit.  Review of Systems     Cardiac Risk Factors include: family history of premature cardiovascular disease     Objective:    Today's Vitals   10/09/20 1350 10/09/20 1352  Weight: 172 lb (78 kg)   Height: 5' 9.5" (1.765 m)   PainSc:  0-No pain   Body mass index is 25.04 kg/m.  Advanced Directives 08/27/2018 03/11/2016  Does Patient Have a Medical Advance Directive? No No  Would patient like information on creating a medical advance directive? - Yes - Educational materials given    Current Medications (verified) Outpatient Encounter Medications as of 10/09/2020  Medication Sig  . Cholecalciferol 25 MCG (1000 UT) tablet Take 4,000 Units by mouth daily.  . clopidogrel (PLAVIX) 75 MG tablet Take 1 tablet (75 mg total) by mouth daily.  . diazepam (VALIUM) 10 MG tablet Take 10 mg by mouth at bedtime as needed for anxiety or sleep. As Needed  . ezetimibe (ZETIA) 10 MG tablet Take 1 tablet (10 mg total) by mouth daily.  Marland Kitchen gabapentin (NEURONTIN) 400 MG capsule Take 1 capsule (400 mg total) by mouth 2 (two) times daily.  . hydrochlorothiazide (HYDRODIURIL) 25 MG tablet Take 1 tablet (25 mg total) by mouth 2 (two) times daily. Pt takes 1 pill each morning and 1/2 a pill each evening  . HYDROcodone-acetaminophen (NORCO) 10-325 MG tablet Take 1 tablet every 4 (four) hours as needed by mouth for severe pain.   Marland Kitchen insulin NPH-regular Human (NOVOLIN 70/30) (70-30) 100 UNIT/ML injection Inject 45 Units into the skin 2 (two) times daily before a meal. When glucose is above 80 and eating  . lansoprazole (PREVACID) 30 MG capsule Take 1 capsule (30 mg total) by mouth 2 (two) times daily before a meal. (Patient taking differently: Take 30 mg by mouth 2 (two) times daily before a meal. Pt states he takes 4 pills a week)  . metFORMIN (GLUCOPHAGE XR) 500 MG 24 hr tablet Take 1 tablet (500 mg total) by  mouth daily with breakfast.  . metoprolol tartrate (LOPRESSOR) 100 MG tablet Take 25 mg by mouth 2 (two) times daily.   . nitroGLYCERIN (NITROSTAT) 0.4 MG SL tablet Place 0.4 mg under the tongue every 5 (five) minutes as needed for chest pain. Reported on 02/26/2016  . NON FORMULARY Domperidone 10 mg Patient takes 2 by mouth daily  . omega-3 acid ethyl esters (LOVAZA) 1 g capsule Take 2 capsules (2 g total) by mouth 2 (two) times daily.  . ondansetron (ZOFRAN) 4 MG tablet Take 4 mg by mouth every 8 (eight) hours as needed for nausea or vomiting.  . Pancrelipase, Lip-Prot-Amyl, 6000 units CPEP Take by mouth as needed.   Marland Kitchen PARoxetine (PAXIL) 10 MG tablet Take 1 tablet (10 mg total) by mouth daily.  . promethazine (PHENERGAN) 25 MG tablet Take 25 mg by mouth every 8 (eight) hours as needed for nausea or vomiting.  . sildenafil (VIAGRA) 50 MG tablet Take 1 tablet (50 mg total) by mouth daily as needed for erectile dysfunction.   No facility-administered encounter medications on file as of 10/09/2020.    Allergies (verified) Metoclopramide hcl, Enalapril, Iodinated diagnostic agents, Iohexol, Propofol, Sulfonamide derivatives, Penicillins, and Sulfa antibiotics   History: Past Medical History:  Diagnosis Date  . Anemia   . 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 Cervical spinal stenosis 05/06/2020  C5-6 level  . Chronic gastritis   . Coronary artery disease    Patent Stent to Circ.  Patent coronary bypass grafts with a free radial graft to PDA and LIMA to the  diag.  . Diabetes mellitus   . Diabetic peripheral neuropathy ([place]) 05/06/2020  . Dyslipidemia    Poorly controlled, probably related to his DM (Some of this is secondary to his inability to afford medications).  . ED (erectile dysfunction)   . Esophageal yeast infection ([place])   . Metaplasia of esophagus   . Pancreatitis chronic    Pseudocyst  . Polycythemia vera(238.4) 04/26/2012   Past Surgical History:  Procedure Laterality Date   . APPENDECTOMY    . CARDIAC CATHETERIZATION    . CHOLECYSTECTOMY    . COLONOSCOPY  07/31/1985  . COLONOSCOPY N/A 03/11/2016   Procedure: COLONOSCOPY;  Surgeon: Rogene Houston, MD;  Location: [facility];  Service: Endoscopy;  Laterality: N/A;  . CORONARY ANGIOPLASTY WITH STENT PLACEMENT  2006  . CORONARY ARTERY BYPASS GRAFT  11/24/2004  . ESOPHAGOGASTRODUODENOSCOPY  09/19/01  . ESOPHAGOGASTRODUODENOSCOPY N/A 03/11/2016   Procedure: ESOPHAGOGASTRODUODENOSCOPY (EGD);  Surgeon: Rogene Houston, MD;  Location: [facility];  Service: Endoscopy;  Laterality: N/A;  12:00  . FINGER TENDON REPAIR     in middle finger  . KNEE SURGERY     left knee  . NASAL SEPTUM SURGERY     Family History  Problem Relation Age of Onset  . Coronary artery disease Mother        strong family history of  . Heart attack Mother        dying of (at age 37)  . Diabetes Mother   . Diabetes Sister   . Diabetes Brother   . Pancreatitis Brother   . Healthy Daughter   . Hyperlipidemia Daughter   . Heart disease Brother   . Hypertension Brother   . Hyperlipidemia Brother    Social History   Socioeconomic History  . Marital status: Married    Spouse name: Not on file  . Number of children: Not on file  . Years of education: Not on file  . Highest education level: Not on file  Occupational History  . Not on file  Tobacco Use  . Smoking status: Never Smoker  . Smokeless tobacco: Never Used  Substance and Sexual Activity  . Alcohol use: Yes    Comment: Patient may drink a beer or glass of wine a couple times a month  . Drug use: No  . Sexual activity: Not on file  Other Topics Concern  . Not on file  Social History Narrative   The patient lives in [place] with his wife.  He is     disabled secondary to coronary artery disease and pancreatitis.  He does     not smoke, he does not drink alcohol.  There is no drug use.    Social Determinants of Health   Financial Resource Strain: Low Risk   .  Difficulty of Paying Living Expenses: Not hard at all  Food Insecurity: No Food Insecurity  . Worried About Charity fundraiser in the Last Year: Never true  . Ran Out of Food in the Last Year: Never true  Transportation Needs: No Transportation Needs  . Lack of Transportation (Medical): No  . Lack of Transportation (Non-Medical): No  Physical Activity: Insufficiently Active  . Days of Exercise per Week: 3 days  . Minutes of Exercise per Session: 20 min  Stress: No Stress Concern Present  . Feeling of Stress : Only a little  Social Connections: Moderately Isolated  . Frequency of Communication with Friends and Family: More than three times a week  . Frequency of Social Gatherings with Friends and Family: Once a week  . Attends Religious Services: Never  . Active Member of Clubs or Organizations: No  . Attends Archivist Meetings: Never  . Marital Status: Married    Tobacco Counseling Counseling given: Not Answered   Clinical Intake:  Pre-visit preparation completed: Yes  Pain : No/denies pain Pain Score: 0-No pain     Nutritional Status: BMI 25 -29 Overweight Diabetes: Yes CBG done?: No Did pt. bring in CBG monitor from home?: No  How often do you need to have someone help you when you read instructions, pamphlets, or other written materials from your doctor or pharmacy?: 1 - Never  Diabetic? yes  Interpreter Needed?: No      Activities of Daily Living In your present state of health, do you have any difficulty performing the following activities: 10/09/2020  Hearing? N  Vision? N  Difficulty concentrating or making decisions? N  Walking or climbing stairs? Y  Dressing or bathing? N  Doing errands, shopping? N  Preparing Food and eating ? N  Using the Toilet? N  In the past six months, have you accidently leaked urine? N  Do you have problems with loss of bowel control? N  Managing your Medications? N  Managing your Finances? N  Housekeeping or  managing your Housekeeping? N  Some recent data might be hidden    Patient Care Team: Lindell Spar, MD as PCP - General (Internal Medicine) Minus Breeding, MD as PCP - Cardiology (Cardiology) Case, Reche Dixon, MD as Attending Physician (Orthopedic Surgery)  Indicate any recent Medical Services you may have received from other than [facility] providers in the past year (date may be approximate).     Assessment:   This is a routine wellness examination for [name].  Hearing/Vision screen No exam data present  Dietary issues and exercise activities discussed: Current Exercise Habits: The patient does not participate in regular exercise at present (lifts small weights as able), Exercise limited by: orthopedic condition(s)  Goals    . Increase physical activity     Try to walk daily as tolerated     . Obtain Eye Exam-Diabetes Type 2        - schedule appointment with eye doctor    Why is this important?    Eye check-ups are important when you have diabetes.   Vision loss can be prevented.    Notes:     . Prevent falls      Depression Screen PHQ 2/9 Scores 10/09/2020 09/23/2020 06/23/2020  PHQ - 2 Score 0 0 0  PHQ- 9 Score - - 8    Fall Risk Fall Risk  10/09/2020 09/23/2020 06/23/2020 05/29/2020  Falls in the past year? 0 0 0 0  Number falls in past yr: 0 0 0 -  Injury with Fall? 0 0 0 -  Risk for fall due to : - No Fall Risks No Fall Risks -  Follow up - Falls evaluation completed Falls evaluation completed Falls evaluation completed    [place]:  Any stairs in or around the home? Yes  If so, are there any without handrails? No  Home free of loose throw rugs in walkways, pet beds, electrical cords, etc? Yes  Adequate lighting in your home to reduce risk of falls? Yes   ASSISTIVE DEVICES UTILIZED TO PREVENT FALLS:  Life alert? No  Use of a cane, walker or w/c? Yes  Grab bars in the bathroom? Yes  Shower chair or bench in shower? No   Elevated toilet seat or a handicapped toilet? No   TIMED UP AND GO:  Was the test performed? No .  Length of time to ambulate 10 feet:      Cognitive Function:     6CIT Screen 10/09/2020  What Year? 0 points  What month? 0 points  What time? 0 points  Count back from 20 0 points  Months in reverse 0 points  Repeat phrase 0 points  Total Score 0    Immunizations  There is no immunization history on file for this patient.  TDAP status: Due, Education has been provided regarding the importance of this vaccine. Advised may receive this vaccine at local pharmacy or Health Dept. Aware to provide a copy of the vaccination record if obtained from local pharmacy or Health Dept. Verbalized acceptance and understanding.  Flu Vaccine status: Up to date  Pneumococcal vaccine status: Up to date  Covid-19 vaccine status: Declined, Education has been provided regarding the importance of this vaccine but patient still declined. Advised may receive this vaccine at local pharmacy or Health Dept.or vaccine clinic. Aware to provide a copy of the vaccination record if obtained from local pharmacy or Health Dept. Verbalized acceptance and understanding.  Qualifies for Shingles Vaccine? No   Zostavax completed No   Shingrix Completed?: No.    Education has been provided regarding the importance of this vaccine. Patient has been advised to call insurance company to determine out of pocket expense if they have not yet received this vaccine. Advised may also receive vaccine at local pharmacy or Health Dept. Verbalized acceptance and understanding.  Screening Tests Health Maintenance  Topic Date Due  . Hepatitis C Screening  Never done  . OPHTHALMOLOGY EXAM  Never done  . HIV Screening  Never done  . TETANUS/TDAP  Never done  . COVID-19 Vaccine (1) 10/09/2020 (Originally 09/10/1972)  . INFLUENZA VACCINE  11/20/2020 (Originally 03/23/2020)  . PNEUMOCOCCAL POLYSACCHARIDE VACCINE AGE 52-64 HIGH RISK   06/23/2021 (Originally 09/10/1962)  . HEMOGLOBIN A1C  03/23/2021  . FOOT EXAM  09/23/2021  . URINE MICROALBUMIN  09/23/2021  . COLONOSCOPY (Pts 45-16yrs Insurance coverage will need to be confirmed)  03/11/2026    Health Maintenance  Health Maintenance Due  Topic Date Due  . Hepatitis C Screening  Never done  . OPHTHALMOLOGY EXAM  Never done  . HIV Screening  Never done  . TETANUS/TDAP  Never done    Colorectal cancer screening: Referral to GI placed yes. Pt aware the office will call re: appt.  Lung Cancer Screening: (Low Dose CT Chest recommended if Age 29-80 years, 30 pack-year currently smoking OR have quit w/in 15years.) does not qualify.   Lung Cancer Screening Referral: no  Additional Screening:  Hepatitis C Screening: does qualify; ordered  Vision Screening: Recommended annual ophthalmology exams for early detection of glaucoma and other disorders of the eye. Is the patient up to date with their annual eye exam?  Yes  Who is the provider or what is the name of the office in which the patient attends annual eye exams? Dr Jorja Loa If pt is not established with a provider, would they like to be referred to a provider to establish care? Yes .  Dental Screening: Recommended annual dental exams for proper oral hygiene  Community Resource Referral / Chronic Care Management: CRR required this visit?  No   CCM required this visit?  No      Plan:     I have personally reviewed and noted the following in the patient's chart:   . Medical and social history . Use of alcohol, tobacco or illicit drugs  . Current medications and supplements . Functional ability and status . Nutritional status . Physical activity . Advanced directives . List of other physicians . Hospitalizations, surgeries, and ER visits in previous 12 months . Vitals . Screenings to include cognitive, depression, and falls . Referrals and appointments  In addition, I have reviewed and discussed with  patient certain preventive protocols, quality metrics, and best practice recommendations. A written personalized care plan for preventive services as well as general preventive health recommendations were provided to patient.     Kate Sable, LPN, LPN   [id number]   Nurse Notes: Visit performed by phone in the office and supervising provider in the office. Gave consent for telephone visit. Time spent with pt 30 mins

## 2020-10-09 NOTE — Patient Instructions (Signed)
[name] , Thank you for taking time to come for your Medicare Wellness Visit. I appreciate your ongoing commitment to your health goals. Please review the following plan we discussed and let me know if I can assist you in the future.   When you come to get your labs done the end of May- remember to tell them you have two orders in there to be drawn.  You are referred to Dr Laural Golden for colonoscopy is due in July 2022  Screening recommendations/referrals: Colonoscopy: due in July (referral sent to Dr Laural Golden) Recommended yearly ophthalmology/optometry visit for glaucoma screening and checkup Recommended yearly dental visit for hygiene and checkup  Vaccinations: Influenza vaccine: up to date Pneumococcal vaccine: up to date Tdap vaccine: not covered by insurance as a preventative vaccine        Next appointment: next Annual Wellness due 10/10/2021 or after  Preventive Care 40-64 Years, Male Preventive care refers to lifestyle choices and visits with your health care provider that can promote health and wellness. What does preventive care include?  A yearly physical exam. This is also called an annual well check.  Dental exams once or twice a year.  Routine eye exams. Ask your health care provider how often you should have your eyes checked.  Personal lifestyle choices, including:  Daily care of your teeth and gums.  Regular physical activity.  Eating a healthy diet.  Avoiding tobacco and drug use.  Limiting alcohol use.  Practicing safe sex.  Taking low-dose aspirin every day starting at age 52. What happens during an annual well check? The services and screenings done by your health care provider during your annual well check will depend on your age, overall health, lifestyle risk factors, and family history of disease. Counseling  Your health care provider may ask you questions about your:  Alcohol use.  Tobacco use.  Drug use.  Emotional well-being.  Home  and relationship well-being.  Sexual activity.  Eating habits.  Work and work Statistician. Screening  You may have the following tests or measurements:  Height, weight, and BMI.  Blood pressure.  Lipid and cholesterol levels. These may be checked every 5 years, or more frequently if you are over 68 years old.  Skin check.  Lung cancer screening. You may have this screening every year starting at age 32 if you have a 30-pack-year history of smoking and currently smoke or have quit within the past 15 years.  Fecal occult blood test (FOBT) of the stool. You may have this test every year starting at age 32.  Flexible sigmoidoscopy or colonoscopy. You may have a sigmoidoscopy every 5 years or a colonoscopy every 10 years starting at age 45.  Prostate cancer screening. Recommendations will vary depending on your family history and other risks.  Hepatitis C blood test.  Hepatitis B blood test.  Sexually transmitted disease (STD) testing.  Diabetes screening. This is done by checking your blood sugar (glucose) after you have not eaten for a while (fasting). You may have this done every 1-3 years. Discuss your test results, treatment options, and if necessary, the need for more tests with your health care provider. Vaccines  Your health care provider may recommend certain vaccines, such as:  Influenza vaccine. This is recommended every year.  Tetanus, diphtheria, and acellular pertussis (Tdap, Td) vaccine. You may need a Td booster every 10 years.  Zoster vaccine. You may need this after age 70.  Pneumococcal 13-valent conjugate (PCV13) vaccine. You may need this  if you have certain conditions and have not been vaccinated.  Pneumococcal polysaccharide (PPSV23) vaccine. You may need one or two doses if you smoke cigarettes or if you have certain conditions. Talk to your health care provider about which screenings and vaccines you need and how often you need them. This information  is not intended to replace advice given to you by your health care provider. Make sure you discuss any questions you have with your health care provider. Document Released: 09/05/2015 Document Revised: 04/28/2016 Document Reviewed: 06/10/2015 Elsevier Interactive Patient Education  2017 [place] Prevention in the Home Falls can cause injuries. They can happen to people of all ages. There are many things you can do to make your home safe and to help prevent falls. What can I do on the outside of my home?  Regularly fix the edges of walkways and driveways and fix any cracks.  Remove anything that might make you trip as you walk through a door, such as a raised step or threshold.  Trim any bushes or trees on the path to your home.  Use bright outdoor lighting.  Clear any walking paths of anything that might make someone trip, such as rocks or tools.  Regularly check to see if handrails are loose or broken. Make sure that both sides of any steps have handrails.  Any raised decks and porches should have guardrails on the edges.  Have any leaves, snow, or ice cleared regularly.  Use sand or salt on walking paths during winter.  Clean up any spills in your garage right away. This includes oil or grease spills. What can I do in the bathroom?  Use night lights.  Install grab bars by the toilet and in the tub and shower. Do not use towel bars as grab bars.  Use non-skid mats or decals in the tub or shower.  If you need to sit down in the shower, use a plastic, non-slip stool.  Keep the floor dry. Clean up any water that spills on the floor as soon as it happens.  Remove soap buildup in the tub or shower regularly.  Attach bath mats securely with double-sided non-slip rug tape.  Do not have throw rugs and other things on the floor that can make you trip. What can I do in the bedroom?  Use night lights.  Make sure that you have a light by your bed that is easy to  reach.  Do not use any sheets or blankets that are too big for your bed. They should not hang down onto the floor.  Have a firm chair that has side arms. You can use this for support while you get dressed.  Do not have throw rugs and other things on the floor that can make you trip. What can I do in the kitchen?  Clean up any spills right away.  Avoid walking on wet floors.  Keep items that you use a lot in easy-to-reach places.  If you need to reach something above you, use a strong step stool that has a grab bar.  Keep electrical cords out of the way.  Do not use floor polish or wax that makes floors slippery. If you must use wax, use non-skid floor wax.  Do not have throw rugs and other things on the floor that can make you trip. What can I do with my stairs?  Do not leave any items on the stairs.  Make sure that there are handrails on  both sides of the stairs and use them. Fix handrails that are broken or loose. Make sure that handrails are as long as the stairways.  Check any carpeting to make sure that it is firmly attached to the stairs. Fix any carpet that is loose or worn.  Avoid having throw rugs at the top or bottom of the stairs. If you do have throw rugs, attach them to the floor with carpet tape.  Make sure that you have a light switch at the top of the stairs and the bottom of the stairs. If you do not have them, ask someone to add them for you. What else can I do to help prevent falls?  Wear shoes that:  Do not have high heels.  Have rubber bottoms.  Are comfortable and fit you well.  Are closed at the toe. Do not wear sandals.  If you use a stepladder:  Make sure that it is fully opened. Do not climb a closed stepladder.  Make sure that both sides of the stepladder are locked into place.  Ask someone to hold it for you, if possible.  Clearly mark and make sure that you can see:  Any grab bars or handrails.  First and last steps.  Where the  edge of each step is.  Use tools that help you move around (mobility aids) if they are needed. These include:  Canes.  Walkers.  Scooters.  Crutches.  Turn on the lights when you go into a dark area. Replace any light bulbs as soon as they burn out.  Set up your furniture so you have a clear path. Avoid moving your furniture around.  If any of your floors are uneven, fix them.  If there are any pets around you, be aware of where they are.  Review your medicines with your doctor. Some medicines can make you feel dizzy. This can increase your chance of falling. Ask your doctor what other things that you can do to help prevent falls. This information is not intended to replace advice given to you by your health care provider. Make sure you discuss any questions you have with your health care provider. Document Released: 06/05/2009 Document Revised: 01/15/2016 Document Reviewed: 09/13/2014 Elsevier Interactive Patient Education  2017 [employer].

## 2020-10-15 DIAGNOSIS — D751 Secondary polycythemia: Secondary | ICD-10-CM | POA: Diagnosis not present

## 2020-10-15 DIAGNOSIS — D45 Polycythemia vera: Secondary | ICD-10-CM | POA: Diagnosis not present

## 2020-10-15 DIAGNOSIS — K861 Other chronic pancreatitis: Secondary | ICD-10-CM | POA: Diagnosis not present

## 2020-10-15 DIAGNOSIS — I7381 Erythromelalgia: Secondary | ICD-10-CM | POA: Diagnosis not present

## 2020-10-20 ENCOUNTER — Other Ambulatory Visit: Payer: Self-pay | Admitting: Internal Medicine

## 2020-10-21 ENCOUNTER — Other Ambulatory Visit: Payer: Self-pay | Admitting: Internal Medicine

## 2020-10-21 ENCOUNTER — Telehealth: Payer: Self-pay

## 2020-10-21 DIAGNOSIS — E1142 Type 2 diabetes mellitus with diabetic polyneuropathy: Secondary | ICD-10-CM

## 2020-10-21 DIAGNOSIS — G894 Chronic pain syndrome: Secondary | ICD-10-CM

## 2020-10-21 MED ORDER — HYDROCODONE-ACETAMINOPHEN 10-325 MG PO TABS: 1.0000 | ORAL_TABLET | Freq: Two times a day (BID) | ORAL | 0 refills | Status: DC | PRN

## 2020-10-21 NOTE — Telephone Encounter (Signed)
Sent. Thanks.   

## 2020-10-21 NOTE — Telephone Encounter (Signed)
Patient called need med refill  Hydrocodone 10-325 mg  Pharmacy:  [facility]

## 2020-10-21 NOTE — Telephone Encounter (Signed)
Please send to the pharmacy if  you would like to refill

## 2020-11-05 ENCOUNTER — Other Ambulatory Visit: Payer: Self-pay | Admitting: Internal Medicine

## 2020-11-17 ENCOUNTER — Telehealth: Payer: Self-pay

## 2020-11-17 ENCOUNTER — Other Ambulatory Visit: Payer: Self-pay | Admitting: Internal Medicine

## 2020-11-17 DIAGNOSIS — E1142 Type 2 diabetes mellitus with diabetic polyneuropathy: Secondary | ICD-10-CM

## 2020-11-17 MED ORDER — HYDROCODONE-ACETAMINOPHEN 10-325 MG PO TABS: ORAL_TABLET | ORAL | 0 refills | Status: DC

## 2020-11-17 NOTE — Telephone Encounter (Signed)
Were you filling his hydrocodone?

## 2020-11-17 NOTE — Telephone Encounter (Signed)
Sent for now. Thanks.

## 2020-11-17 NOTE — Telephone Encounter (Signed)
Patient called need med refill. He takes 1 1/2 pill a day.   Patient call back # [phone]. Patient request qty: 120 instead of 30. Patient likes a 90 day supply on all his meds.  Hydrocodone 10-325 mg  Pharmacy: [facility]

## 2020-12-12 ENCOUNTER — Other Ambulatory Visit: Payer: Self-pay

## 2020-12-12 ENCOUNTER — Ambulatory Visit (INDEPENDENT_AMBULATORY_CARE_PROVIDER_SITE_OTHER): Payer: PPO | Admitting: Internal Medicine

## 2020-12-12 ENCOUNTER — Encounter: Payer: Self-pay | Admitting: Internal Medicine

## 2020-12-12 VITALS — BP 132/84 | HR 75 | Temp 97.8°F | Ht 69.5 in | Wt 171.0 lb

## 2020-12-12 DIAGNOSIS — Z114 Encounter for screening for human immunodeficiency virus [HIV]: Secondary | ICD-10-CM

## 2020-12-12 DIAGNOSIS — M940 Chondrocostal junction syndrome [Tietze]: Secondary | ICD-10-CM

## 2020-12-12 DIAGNOSIS — M25512 Pain in left shoulder: Secondary | ICD-10-CM

## 2020-12-12 DIAGNOSIS — Z1159 Encounter for screening for other viral diseases: Secondary | ICD-10-CM | POA: Diagnosis not present

## 2020-12-12 DIAGNOSIS — M25511 Pain in right shoulder: Secondary | ICD-10-CM | POA: Diagnosis not present

## 2020-12-12 MED ORDER — CYCLOBENZAPRINE HCL 10 MG PO TABS: 10.0000 mg | ORAL_TABLET | Freq: Two times a day (BID) | ORAL | 0 refills | Status: DC | PRN

## 2020-12-12 MED ORDER — PREDNISONE 20 MG PO TABS: 20.0000 mg | ORAL_TABLET | Freq: Every day | ORAL | 0 refills | Status: DC

## 2020-12-12 NOTE — Patient Instructions (Signed)
Please start taking Prednisone as prescribed. Continue to follow low carb diet and take insulin as prescribed.  Please take Flexeril for muscle spasms.  Please contact your Orthopedic surgeon to discuss about swelling over clavicle.

## 2020-12-12 NOTE — Progress Notes (Signed)
Acute Office Visit  Subjective:    Patient ID: [name], male    DOB: December 02, 1960, 60 y.o.   MRN: [id number]  Chief Complaint  Patient presents with  . Chest Injury    Feels a knot in the upper middle of his chest, was changing a sink out earlier in the week and thinks he did something then.     HPI Patient is in today for evaluation of a swelling/knot that he noticed over right clavicular area. He mentions that he had been fixing his sink and had to lift heavy weight above his shoulder level for few mins. He has been having muscle soreness on both sides of chest. He has been trying to apply ice pack and take his PRN pain medications with minimal help. He denies any dyspnea, wheezing or arm heaviness for now.  Past Medical History:  Diagnosis Date  . Anemia   . 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 Cervical spinal stenosis 05/06/2020   C5-6 level  . Chronic gastritis   . Coronary artery disease    Patent Stent to Circ.  Patent coronary bypass grafts with a free radial graft to PDA and LIMA to the  diag.  . Diabetes mellitus   . Diabetic peripheral neuropathy (HCC) 05/06/2020  . Dyslipidemia    Poorly controlled, probably related to his DM (Some of this is secondary to his inability to afford medications).  . ED (erectile dysfunction)   . Esophageal yeast infection (HCC)   . Metaplasia of esophagus   . Pancreatitis chronic    Pseudocyst  . Polycythemia vera(238.4) 04/26/2012    Past Surgical History:  Procedure Laterality Date  . APPENDECTOMY    . CARDIAC CATHETERIZATION    . CHOLECYSTECTOMY    . COLONOSCOPY  07/31/1985  . COLONOSCOPY N/A 03/11/2016   Procedure: COLONOSCOPY;  Surgeon: Malissa Hippo, MD;  [facility];  Service: Endoscopy;  Laterality: N/A;  . CORONARY ANGIOPLASTY WITH STENT PLACEMENT  2006  . CORONARY ARTERY BYPASS GRAFT  11/24/2004  . ESOPHAGOGASTRODUODENOSCOPY  09/19/01  . ESOPHAGOGASTRODUODENOSCOPY N/A 03/11/2016   Procedure:  ESOPHAGOGASTRODUODENOSCOPY (EGD);  Surgeon: Malissa Hippo, MD;  Location: [facility];  Service: Endoscopy;  Laterality: N/A;  12:00  . FINGER TENDON REPAIR     in middle finger  . KNEE SURGERY     left knee  . NASAL SEPTUM SURGERY      Family History  Problem Relation Age of Onset  . Coronary artery disease Mother        strong family history of  . Heart attack Mother        dying of (at age 73)  . Diabetes Mother   . Diabetes Sister   . Diabetes Brother   . Pancreatitis Brother   . Healthy Daughter   . Hyperlipidemia Daughter   . Heart disease Brother   . Hypertension Brother   . Hyperlipidemia Brother     Social History   Socioeconomic History  . Marital status: Married    Spouse name: Not on file  . Number of children: Not on file  . Years of education: Not on file  . Highest education level: Not on file  Occupational History  . Not on file  Tobacco Use  . Smoking status: Never Smoker  . Smokeless tobacco: Never Used  Substance and Sexual Activity  . Alcohol use: Yes    Comment: Patient may drink a beer or glass of  wine a couple times a month  . Drug use: No  . Sexual activity: Not on file  Other Topics Concern  . Not on file  Social History Narrative   The patient lives in [place] with his wife.  He is     disabled secondary to coronary artery disease and pancreatitis.  He does     not smoke, he does not drink alcohol.  There is no drug use.    Social Determinants of Health   Financial Resource Strain: Low Risk   . Difficulty of Paying Living Expenses: Not hard at all  Food Insecurity: No Food Insecurity  . Worried About Charity fundraiser in the Last Year: Never true  . Ran Out of Food in the Last Year: Never true  Transportation Needs: No Transportation Needs  . Lack of Transportation (Medical): No  . Lack of Transportation (Non-Medical): No  Physical Activity: Insufficiently Active  . Days of Exercise per Week: 3 days  . Minutes of  Exercise per Session: 20 min  Stress: No Stress Concern Present  . Feeling of Stress : Only a little  Social Connections: Moderately Isolated  . Frequency of Communication with Friends and Family: More than three times a week  . Frequency of Social Gatherings with Friends and Family: Once a week  . Attends Religious Services: Never  . Active Member of Clubs or Organizations: No  . Attends Archivist Meetings: Never  . Marital Status: Married  Human resources officer Violence: Not on file    Outpatient Medications Prior to Visit  Medication Sig Dispense Refill  . Cholecalciferol 25 MCG (1000 UT) tablet Take 4,000 Units by mouth daily.    . clopidogrel (PLAVIX) 75 MG tablet TAKE ONE TABLET BY MOUTH ONCE DAILY AS A BLOOD THINNER. 90 tablet 0  . Continuous Blood Gluc Sensor (FREESTYLE LIBRE 2 SENSOR) MISC USE TO CHECK BLOOD SUGAR AS DIRECTED. 1 each 0  . diazepam (VALIUM) 10 MG tablet Take 10 mg by mouth at bedtime as needed for anxiety or sleep. As Needed    . ezetimibe (ZETIA) 10 MG tablet Take 1 tablet (10 mg total) by mouth daily. 90 tablet 1  . gabapentin (NEURONTIN) 400 MG capsule Take 1 capsule (400 mg total) by mouth 2 (two) times daily. 60 capsule 3  . hydrochlorothiazide (HYDRODIURIL) 25 MG tablet Take 1 tablet (25 mg total) by mouth 2 (two) times daily. Pt takes 1 pill each morning and 1/2 a pill each evening 60 tablet 2  . HYDROcodone-acetaminophen (NORCO) 10-325 MG tablet TAKE (1) TABLET BY MOUTH FOUR TIMES DAILY AS NEEDED FOR SEVERE PAIN. 120 tablet 0  . insulin NPH-regular Human (NOVOLIN 70/30) (70-30) 100 UNIT/ML injection Inject 45 Units into the skin 2 (two) times daily before a meal. When glucose is above 80 and eating    . lansoprazole (PREVACID) 30 MG capsule Take 1 capsule (30 mg total) by mouth 2 (two) times daily before a meal. (Patient taking differently: Take 30 mg by mouth 2 (two) times daily before a meal. Pt states he takes 4 pills a week) 60 capsule 3  .  metFORMIN (GLUCOPHAGE XR) 500 MG 24 hr tablet Take 1 tablet (500 mg total) by mouth daily with breakfast. 90 tablet 1  . metoprolol tartrate (LOPRESSOR) 100 MG tablet Take 25 mg by mouth 2 (two) times daily.     . nitroGLYCERIN (NITROSTAT) 0.4 MG SL tablet Place 0.4 mg under the tongue every 5 (five) minutes as needed  for chest pain. Reported on 02/26/2016    . NON FORMULARY Domperidone 10 mg Patient takes 2 by mouth daily    . omega-3 acid ethyl esters (LOVAZA) 1 g capsule Take 2 capsules (2 g total) by mouth 2 (two) times daily. 360 capsule 2  . ondansetron (ZOFRAN) 4 MG tablet Take 4 mg by mouth every 8 (eight) hours as needed for nausea or vomiting.    . Pancrelipase, Lip-Prot-Amyl, 6000 units CPEP Take by mouth as needed.     Marland Kitchen PARoxetine (PAXIL) 10 MG tablet Take 1 tablet (10 mg total) by mouth daily. 90 tablet 1  . promethazine (PHENERGAN) 25 MG tablet Take 25 mg by mouth every 8 (eight) hours as needed for nausea or vomiting.    . sildenafil (VIAGRA) 50 MG tablet Take 1 tablet (50 mg total) by mouth daily as needed for erectile dysfunction. 10 tablet 3   No facility-administered medications prior to visit.    Allergies  Allergen Reactions  . Metoclopramide Hcl Hives    Patient became very spastic  . Enalapril Cough  . Iodinated Diagnostic Agents     Rash, nausea and flushing  . Iohexol      Desc: HIVES,SOB NEEDS PREP.MEDS   . Propofol Other (See Comments)    Pancreatic attack.  . Sulfonamide Derivatives     Large hives  . Penicillins Rash    Has patient had a PCN reaction causing immediate rash, facial/tongue/throat swelling, SOB or lightheadedness with hypotension: Yes Has patient had a PCN reaction causing severe rash involving mucus membranes or skin necrosis: No Has patient had a PCN reaction that required hospitalization No Has patient had a PCN reaction occurring within the last 10 years: No. If all of the above answers are "NO", then may proceed with Cephalosporin use.    . Sulfa Antibiotics Rash    Other Reaction: Not Assessed    Review of Systems  Constitutional: Negative for chills and fever.  HENT: Negative for congestion and sore throat.   Eyes: Negative for pain and discharge.  Respiratory: Negative for cough and shortness of breath.   Cardiovascular: Negative for palpitations.       Chest wall pain b/l  Gastrointestinal: Negative for constipation, diarrhea, nausea and vomiting.  Endocrine: Negative for polydipsia and polyuria.  Genitourinary: Negative for dysuria and hematuria.  Musculoskeletal: Positive for arthralgias, back pain and neck pain. Negative for neck stiffness.  Skin: Negative for rash.  Neurological: Positive for numbness (B/l LE, intermittent). Negative for dizziness, weakness and headaches.  Psychiatric/Behavioral: Negative for agitation and behavioral problems.       Objective:    Physical Exam Vitals reviewed.  Constitutional:      General: He is not in acute distress.    Appearance: He is not diaphoretic.  HENT:     Head: Normocephalic and atraumatic.     Nose: Nose normal.     Mouth/Throat:     Mouth: Mucous membranes are moist.  Eyes:     General: No scleral icterus.    Extraocular Movements: Extraocular movements intact.  Cardiovascular:     Rate and Rhythm: Normal rate and regular rhythm.     Pulses: Normal pulses.     Heart sounds: Normal heart sounds. No murmur heard.   Pulmonary:     Breath sounds: Normal breath sounds. No wheezing or rales.  Chest:     Chest wall: Tenderness (In upper chest wall area b/l) present.  Musculoskeletal:  General: Swelling (Over medial aspect of clavicle) present.     Cervical back: Neck supple. No tenderness.     Right lower leg: No edema.     Left lower leg: No edema.  Skin:    General: Skin is warm.     Findings: No rash.  Neurological:     General: No focal deficit present.     Mental Status: He is alert and oriented to person, place, and time.      Sensory: No sensory deficit.     Motor: No weakness.  Psychiatric:        Mood and Affect: Mood normal.        Behavior: Behavior normal.     BP 132/84 (BP Location: Right Arm, Patient Position: Sitting, Cuff Size: Normal)   Pulse 75   Temp 97.8 F (36.6 C) (Oral)   Ht 5' 9.5" (1.765 m)   Wt 171 lb (77.6 kg)   SpO2 96%   BMI 24.89 kg/m  Wt Readings from Last 3 Encounters:  12/12/20 171 lb (77.6 kg)  10/09/20 172 lb (78 kg)  09/23/20 172 lb (78 kg)    Health Maintenance Due  Topic Date Due  . Hepatitis C Screening  Never done    There are no preventive care reminders to display for this patient.   Lab Results  Component Value Date   TSH 2.570 05/19/2020   Lab Results  Component Value Date   WBC 6.6 06/27/2020   HGB 13.6 06/27/2020   HCT 42.8 06/27/2020   MCV 75 (L) 06/27/2020   PLT 297 06/27/2020   Lab Results  Component Value Date   NA 135 06/27/2020   K 4.6 06/27/2020   CO2 25 06/27/2020   GLUCOSE 143 (H) 06/27/2020   BUN 21 06/27/2020   CREATININE 1.44 (H) 06/27/2020   BILITOT 0.6 06/27/2020   ALKPHOS 56 06/27/2020   AST 26 06/27/2020   ALT 15 06/27/2020   PROT 7.1 06/27/2020   ALBUMIN 4.6 06/27/2020   CALCIUM 9.6 06/27/2020   ANIONGAP 13 01/08/2018   Lab Results  Component Value Date   CHOL 332 (H) 06/27/2020   Lab Results  Component Value Date   HDL 42 06/27/2020   Lab Results  Component Value Date   LDLCALC 156 (H) 06/27/2020   Lab Results  Component Value Date   TRIG 672 (HH) 06/27/2020   Lab Results  Component Value Date   CHOLHDL 7.9 (H) 06/27/2020   Lab Results  Component Value Date   HGBA1C 8.2 09/23/2020       Assessment & Plan:   Problem List Items Addressed This Visit   None   Visit Diagnoses    Costochondritis    -  Primary Likely reason for chest pain EKG showed sinus rhythm. No signs of active ischemia. Unable to prescribe NSAID due to cardiac history Prednisone for 5 days Flexeril PRN  Shoulder  pain Referred to Orthopedic surgeon for assessment of shoulder pain and swelling over clavicle   Relevant Medications   predniSONE (DELTASONE) 20 MG tablet   cyclobenzaprine (FLEXERIL) 10 MG tablet   Need for hepatitis C screening test       Relevant Orders   Hepatitis C Antibody   Encounter for screening for HIV       Relevant Orders   HIV antibody (with reflex)       Meds ordered this encounter  Medications  . predniSONE (DELTASONE) 20 MG tablet    Sig: Take 1  tablet (20 mg total) by mouth daily with breakfast.    Dispense:  5 tablet    Refill:  0  . cyclobenzaprine (FLEXERIL) 10 MG tablet    Sig: Take 1 tablet (10 mg total) by mouth 2 (two) times daily as needed for muscle spasms.    Dispense:  30 tablet    Refill:  0     Marquese Burkland Keith Rake, MD

## 2020-12-15 ENCOUNTER — Telehealth: Payer: Self-pay

## 2020-12-15 NOTE — Telephone Encounter (Signed)
This will need an appt he was last seen for knot on his chest not hands and feet

## 2020-12-15 NOTE — Telephone Encounter (Signed)
Lori sent phone message advised he needs phone visit

## 2020-12-15 NOTE — Telephone Encounter (Signed)
Patient called said the Flexeril 10 mg makes his hands and feet have more pain, he can not take this any longer. He asked could provider send in Methocarbamol 500 mg this was given to him last year in the ER and it helped.  Pharmacy: [facility]

## 2020-12-15 NOTE — Telephone Encounter (Signed)
Patient calling in regards to complications he is having with the flexeril he would like to speak with someone ph# [phone]

## 2020-12-16 ENCOUNTER — Encounter: Payer: Self-pay | Admitting: Internal Medicine

## 2020-12-16 ENCOUNTER — Other Ambulatory Visit: Payer: Self-pay

## 2020-12-16 ENCOUNTER — Ambulatory Visit (INDEPENDENT_AMBULATORY_CARE_PROVIDER_SITE_OTHER): Payer: PPO | Admitting: Internal Medicine

## 2020-12-16 DIAGNOSIS — M25511 Pain in right shoulder: Secondary | ICD-10-CM

## 2020-12-16 DIAGNOSIS — M25512 Pain in left shoulder: Secondary | ICD-10-CM | POA: Diagnosis not present

## 2020-12-16 MED ORDER — METHOCARBAMOL 500 MG PO TABS: 500.0000 mg | ORAL_TABLET | Freq: Three times a day (TID) | ORAL | 2 refills | Status: DC | PRN

## 2020-12-16 NOTE — Patient Instructions (Signed)
Please take Robaxin instead of Flexeril.  Continue to take other medications as prescribed.

## 2020-12-16 NOTE — Progress Notes (Signed)
Virtual Visit via Telephone Note   This visit type was conducted due to national recommendations for restrictions regarding the COVID-19 Pandemic (e.g. social distancing) in an effort to limit this patient's exposure and mitigate transmission in our community.  Due to his co-morbid illnesses, this patient is at least at moderate risk for complications without adequate follow up.  This format is felt to be most appropriate for this patient at this time.  The patient did not have access to video technology/had technical difficulties with video requiring transitioning to audio format only (telephone).  All issues noted in this document were discussed and addressed.  No physical exam could be performed with this format.  Evaluation Performed:  Follow-up visit  Date:  12/16/2020   ID:  [name], [name] [birth year], MRN [id number]  Patient Location: Home Provider Location: Office/Clinic  Participants: Patient Location of Patient: Home Location of Provider: Telehealth Consent was obtain for visit to be over via telehealth. I verified that I am speaking with the correct person using two identifiers.  PCP:  Lindell Spar, MD   Chief Complaint:  Shoulder pain  History of Present Illness:    [name] is a 60 y.o. male who has a televisit for c/o neuropathic pain after taking Flexeril. He has chronic neuropathic pain, which became worse with Flexeril. He has tried Robaxin in the past, and agrees to take it instead of Flexeril. He is going to see Orthopedic surgeon in the next month.  The patient does not have symptoms concerning for COVID-19 infection (fever, chills, cough, or new shortness of breath).   Past Medical, Surgical, Social History, Allergies, and Medications have been Reviewed.  Past Medical History:  Diagnosis Date  . Anemia   . Bypass graft stenosis ([place])    '06  . Cervical spinal stenosis 05/06/2020   C5-6 level  . Chronic gastritis   . Coronary artery disease     Patent Stent to Circ.  Patent coronary bypass grafts with a free radial graft to PDA and LIMA to the  diag.  . Diabetes mellitus   . Diabetic peripheral neuropathy ([place]) 05/06/2020  . Dyslipidemia    Poorly controlled, probably related to his DM (Some of this is secondary to his inability to afford medications).  . ED (erectile dysfunction)   . Esophageal yeast infection ([place])   . Metaplasia of esophagus   . Pancreatitis chronic    Pseudocyst  . Polycythemia vera(238.4) 04/26/2012   Past Surgical History:  Procedure Laterality Date  . APPENDECTOMY    . CARDIAC CATHETERIZATION    . CHOLECYSTECTOMY    . COLONOSCOPY  07/31/1985  . COLONOSCOPY N/A 03/11/2016   Procedure: COLONOSCOPY;  Surgeon: Rogene Houston, MD;  [facility];  Service: Endoscopy;  Laterality: N/A;  . CORONARY ANGIOPLASTY WITH STENT PLACEMENT  2006  . CORONARY ARTERY BYPASS GRAFT  11/24/2004  . ESOPHAGOGASTRODUODENOSCOPY  09/19/01  . ESOPHAGOGASTRODUODENOSCOPY N/A 03/11/2016   Procedure: ESOPHAGOGASTRODUODENOSCOPY (EGD);  Surgeon: Rogene Houston, MD;  [facility];  Service: Endoscopy;  Laterality: N/A;  12:00  . FINGER TENDON REPAIR     in middle finger  . KNEE SURGERY     left knee  . NASAL SEPTUM SURGERY       Current Meds  Medication Sig  . Cholecalciferol 25 MCG (1000 UT) tablet Take 4,000 Units by mouth daily.  . clopidogrel (PLAVIX) 75 MG tablet TAKE ONE TABLET BY MOUTH ONCE DAILY AS A  BLOOD THINNER.  Marland Kitchen Continuous Blood Gluc Sensor (FREESTYLE LIBRE 2 SENSOR) MISC USE TO CHECK BLOOD SUGAR AS DIRECTED.  Marland Kitchen diazepam (VALIUM) 10 MG tablet Take 10 mg by mouth at bedtime as needed for anxiety or sleep. As Needed  . ezetimibe (ZETIA) 10 MG tablet Take 1 tablet (10 mg total) by mouth daily.  Marland Kitchen gabapentin (NEURONTIN) 400 MG capsule Take 1 capsule (400 mg total) by mouth 2 (two) times daily.  . hydrochlorothiazide (HYDRODIURIL) 25 MG tablet Take 1 tablet (25 mg total) by mouth 2 (two) times  daily. Pt takes 1 pill each morning and 1/2 a pill each evening  . HYDROcodone-acetaminophen (NORCO) 10-325 MG tablet TAKE (1) TABLET BY MOUTH FOUR TIMES DAILY AS NEEDED FOR SEVERE PAIN.  Marland Kitchen insulin NPH-regular Human (NOVOLIN 70/30) (70-30) 100 UNIT/ML injection Inject 45 Units into the skin 2 (two) times daily before a meal. When glucose is above 80 and eating  . lansoprazole (PREVACID) 30 MG capsule Take 1 capsule (30 mg total) by mouth 2 (two) times daily before a meal. (Patient taking differently: Take 30 mg by mouth 2 (two) times daily before a meal. Pt states he takes 4 pills a week)  . metFORMIN (GLUCOPHAGE XR) 500 MG 24 hr tablet Take 1 tablet (500 mg total) by mouth daily with breakfast.  . methocarbamol (ROBAXIN) 500 MG tablet Take 1 tablet (500 mg total) by mouth every 8 (eight) hours as needed for muscle spasms.  . metoprolol tartrate (LOPRESSOR) 100 MG tablet Take 25 mg by mouth 2 (two) times daily.   . nitroGLYCERIN (NITROSTAT) 0.4 MG SL tablet Place 0.4 mg under the tongue every 5 (five) minutes as needed for chest pain. Reported on 02/26/2016  . NON FORMULARY Domperidone 10 mg Patient takes 2 by mouth daily  . omega-3 acid ethyl esters (LOVAZA) 1 g capsule Take 2 capsules (2 g total) by mouth 2 (two) times daily.  . ondansetron (ZOFRAN) 4 MG tablet Take 4 mg by mouth every 8 (eight) hours as needed for nausea or vomiting.  . Pancrelipase, Lip-Prot-Amyl, 6000 units CPEP Take by mouth as needed.   Marland Kitchen PARoxetine (PAXIL) 10 MG tablet Take 1 tablet (10 mg total) by mouth daily.  . predniSONE (DELTASONE) 20 MG tablet Take 1 tablet (20 mg total) by mouth daily with breakfast.  . promethazine (PHENERGAN) 25 MG tablet Take 25 mg by mouth every 8 (eight) hours as needed for nausea or vomiting.  . sildenafil (VIAGRA) 50 MG tablet Take 1 tablet (50 mg total) by mouth daily as needed for erectile dysfunction.     Allergies:   Iodinated diagnostic agents, Metoclopramide hcl, Propofol, Iohexol,  Propofol, Sulfonamide derivatives, Enalapril, Penicillins, and Sulfa antibiotics   ROS:   Please see the history of present illness.     All other systems reviewed and are negative.   Labs/Other Tests and Data Reviewed:    Recent Labs: 05/19/2020: TSH 2.570 06/27/2020: ALT 15; BUN 21; Creatinine, Ser 1.44; Hemoglobin 13.6; Platelets 297; Potassium 4.6; Sodium 135   Recent Lipid Panel Lab Results  Component Value Date/Time   CHOL 332 (H) 06/27/2020 03:00 PM   TRIG 672 (HH) 06/27/2020 03:00 PM   HDL 42 06/27/2020 03:00 PM   CHOLHDL 7.9 (H) 06/27/2020 03:00 PM   CHOLHDL 6.9 (H) 08/19/2016 12:59 PM   LDLCALC 156 (H) 06/27/2020 03:00 PM   LDLDIRECT 110 08/19/2016 12:59 PM    Wt Readings from Last 3 Encounters:  12/12/20 171 lb (77.6 kg)  10/09/20  172 lb (78 kg)  09/23/20 172 lb (78 kg)      ASSESSMENT & PLAN:    Shoulder pain Idiosyncratic reaction to Flexeril Will change to Robaxin as patient has tolerated it in the past F/u with Orthopedic surgeon Continue Gabapentin and PRN Norco for neuropathic pain  Time:   Today, I have spent 9 minutes reviewing the chart, including problem list, medications, and with the patient with telehealth technology discussing the above problems.   Medication Adjustments/Labs and Tests Ordered: Current medicines are reviewed at length with the patient today.  Concerns regarding medicines are outlined above.   Tests Ordered: No orders of the defined types were placed in this encounter.   Medication Changes: Meds ordered this encounter  Medications  . methocarbamol (ROBAXIN) 500 MG tablet    Sig: Take 1 tablet (500 mg total) by mouth every 8 (eight) hours as needed for muscle spasms.    Dispense:  30 tablet    Refill:  2     Note: This dictation was prepared with Dragon dictation along with smaller phrase technology. Similar sounding words can be transcribed inadequately or may not be corrected upon review. Any transcriptional errors  that result from this process are unintentional.      Disposition:  Follow up  Signed, Lindell Spar, MD  12/16/2020 9:07 AM     [facility]

## 2020-12-16 NOTE — Telephone Encounter (Signed)
Left voicemail to return call to schedule an appointment.

## 2020-12-18 ENCOUNTER — Other Ambulatory Visit: Payer: Self-pay | Admitting: Internal Medicine

## 2020-12-18 DIAGNOSIS — E782 Mixed hyperlipidemia: Secondary | ICD-10-CM

## 2020-12-29 DIAGNOSIS — M25511 Pain in right shoulder: Secondary | ICD-10-CM | POA: Diagnosis not present

## 2021-01-09 DIAGNOSIS — D45 Polycythemia vera: Secondary | ICD-10-CM | POA: Diagnosis not present

## 2021-01-09 DIAGNOSIS — D751 Secondary polycythemia: Secondary | ICD-10-CM | POA: Diagnosis not present

## 2021-01-09 DIAGNOSIS — I7381 Erythromelalgia: Secondary | ICD-10-CM | POA: Diagnosis not present

## 2021-01-14 ENCOUNTER — Other Ambulatory Visit: Payer: Self-pay | Admitting: Internal Medicine

## 2021-01-14 DIAGNOSIS — I10 Essential (primary) hypertension: Secondary | ICD-10-CM | POA: Diagnosis not present

## 2021-01-14 DIAGNOSIS — Z1159 Encounter for screening for other viral diseases: Secondary | ICD-10-CM | POA: Diagnosis not present

## 2021-01-14 DIAGNOSIS — E785 Hyperlipidemia, unspecified: Secondary | ICD-10-CM | POA: Diagnosis not present

## 2021-01-14 DIAGNOSIS — Z794 Long term (current) use of insulin: Secondary | ICD-10-CM | POA: Diagnosis not present

## 2021-01-14 DIAGNOSIS — Z114 Encounter for screening for human immunodeficiency virus [HIV]: Secondary | ICD-10-CM | POA: Diagnosis not present

## 2021-01-14 DIAGNOSIS — F339 Major depressive disorder, recurrent, unspecified: Secondary | ICD-10-CM | POA: Diagnosis not present

## 2021-01-14 DIAGNOSIS — Z8719 Personal history of other diseases of the digestive system: Secondary | ICD-10-CM | POA: Diagnosis not present

## 2021-01-14 DIAGNOSIS — E1140 Type 2 diabetes mellitus with diabetic neuropathy, unspecified: Secondary | ICD-10-CM | POA: Diagnosis not present

## 2021-01-15 LAB — HIV ANTIBODY (ROUTINE TESTING W REFLEX): HIV Screen 4th Generation wRfx: NONREACTIVE

## 2021-01-15 LAB — HEPATITIS C ANTIBODY: Hep C Virus Ab: <0.1 s/co ratio (ref 0.0–0.9)

## 2021-01-16 LAB — CBC WITH DIFFERENTIAL/PLATELET
Basophils Absolute: 0 10*3/uL (ref 0.0–0.2)
Basos: 1 %
EOS (ABSOLUTE): 0.1 10*3/uL (ref 0.0–0.4)
Eos: 3 %
Hematocrit: 45.2 % (ref 37.5–51.0)
Hemoglobin: 14 g/dL (ref 13.0–17.7)
Immature Grans (Abs): 0 10*3/uL (ref 0.0–0.1)
Immature Granulocytes: 0 %
Lymphocytes Absolute: 1.2 10*3/uL (ref 0.7–3.1)
Lymphs: 22 %
MCH: 23.7 pg — ABNORMAL LOW (ref 26.6–33.0)
MCHC: 31 g/dL — ABNORMAL LOW (ref 31.5–35.7)
MCV: 77 fL — ABNORMAL LOW (ref 79–97)
Monocytes Absolute: 0.5 10*3/uL (ref 0.1–0.9)
Monocytes: 10 %
Neutrophils Absolute: 3.5 10*3/uL (ref 1.4–7.0)
Neutrophils: 64 %
Platelets: 232 10*3/uL (ref 150–450)
RBC: 5.91 x10E6/uL — ABNORMAL HIGH (ref 4.14–5.80)
RDW: 18.5 % — ABNORMAL HIGH (ref 11.6–15.4)
WBC: 5.5 10*3/uL (ref 3.4–10.8)

## 2021-01-16 LAB — CMP14+EGFR
ALT: 17 IU/L (ref 0–44)
AST: 23 IU/L (ref 0–40)
Albumin/Globulin Ratio: 1.8 (ref 1.2–2.2)
Albumin: 4.2 g/dL (ref 3.8–4.9)
Alkaline Phosphatase: 55 IU/L (ref 44–121)
BUN/Creatinine Ratio: 16 (ref 9–20)
BUN: 20 mg/dL (ref 6–24)
Bilirubin Total: 0.5 mg/dL (ref 0.0–1.2)
CO2: 21 mmol/L (ref 20–29)
Calcium: 9.4 mg/dL (ref 8.7–10.2)
Chloride: 98 mmol/L (ref 96–106)
Creatinine, Ser: 1.26 mg/dL (ref 0.76–1.27)
Globulin, Total: 2.4 g/dL (ref 1.5–4.5)
Glucose: 171 mg/dL — ABNORMAL HIGH (ref 65–99)
Potassium: 4.2 mmol/L (ref 3.5–5.2)
Sodium: 137 mmol/L (ref 134–144)
Total Protein: 6.6 g/dL (ref 6.0–8.5)
eGFR: 66 mL/min/{1.73_m2} (ref >59)

## 2021-01-16 LAB — TSH: TSH: 1.87 u[IU]/mL (ref 0.450–4.500)

## 2021-01-16 LAB — LIPID PANEL
Chol/HDL Ratio: 6.7 ratio — ABNORMAL HIGH (ref 0.0–5.0)
Cholesterol, Total: 229 mg/dL — ABNORMAL HIGH (ref 100–199)
HDL: 34 mg/dL — ABNORMAL LOW (ref >39)
LDL Chol Calc (NIH): 96 mg/dL (ref 0–99)
Triglycerides: 592 mg/dL (ref 0–149)
VLDL Cholesterol Cal: 99 mg/dL — ABNORMAL HIGH (ref 5–40)

## 2021-01-16 LAB — HEMOGLOBIN A1C
Est. average glucose Bld gHb Est-mCnc: 217 mg/dL
Hgb A1c MFr Bld: 9.2 % — ABNORMAL HIGH (ref 4.8–5.6)

## 2021-01-19 ENCOUNTER — Other Ambulatory Visit: Payer: Self-pay | Admitting: Cardiology

## 2021-01-20 ENCOUNTER — Other Ambulatory Visit: Payer: Self-pay | Admitting: Internal Medicine

## 2021-01-20 DIAGNOSIS — E1142 Type 2 diabetes mellitus with diabetic polyneuropathy: Secondary | ICD-10-CM

## 2021-01-20 DIAGNOSIS — F339 Major depressive disorder, recurrent, unspecified: Secondary | ICD-10-CM

## 2021-01-20 DIAGNOSIS — I10 Essential (primary) hypertension: Secondary | ICD-10-CM

## 2021-01-21 ENCOUNTER — Ambulatory Visit (INDEPENDENT_AMBULATORY_CARE_PROVIDER_SITE_OTHER): Payer: PPO | Admitting: Internal Medicine

## 2021-01-21 ENCOUNTER — Encounter: Payer: Self-pay | Admitting: "Endocrinology

## 2021-01-21 ENCOUNTER — Encounter: Payer: Self-pay | Admitting: Internal Medicine

## 2021-01-21 ENCOUNTER — Other Ambulatory Visit: Payer: Self-pay

## 2021-01-21 ENCOUNTER — Ambulatory Visit: Payer: PPO | Admitting: "Endocrinology

## 2021-01-21 VITALS — BP 138/84 | HR 60 | Ht 69.5 in | Wt 174.8 lb

## 2021-01-21 VITALS — BP 131/83 | HR 67 | Temp 97.5°F | Ht 69.5 in | Wt 174.0 lb

## 2021-01-21 DIAGNOSIS — E0865 Diabetes mellitus due to underlying condition with hyperglycemia: Secondary | ICD-10-CM | POA: Diagnosis not present

## 2021-01-21 DIAGNOSIS — E1142 Type 2 diabetes mellitus with diabetic polyneuropathy: Secondary | ICD-10-CM | POA: Diagnosis not present

## 2021-01-21 DIAGNOSIS — IMO0002 Reserved for concepts with insufficient information to code with codable children: Secondary | ICD-10-CM

## 2021-01-21 DIAGNOSIS — I10 Essential (primary) hypertension: Secondary | ICD-10-CM

## 2021-01-21 DIAGNOSIS — Z794 Long term (current) use of insulin: Secondary | ICD-10-CM

## 2021-01-21 DIAGNOSIS — I25118 Atherosclerotic heart disease of native coronary artery with other forms of angina pectoris: Secondary | ICD-10-CM | POA: Diagnosis not present

## 2021-01-21 DIAGNOSIS — K8681 Exocrine pancreatic insufficiency: Secondary | ICD-10-CM

## 2021-01-21 DIAGNOSIS — Z789 Other specified health status: Secondary | ICD-10-CM

## 2021-01-21 DIAGNOSIS — Z8719 Personal history of other diseases of the digestive system: Secondary | ICD-10-CM

## 2021-01-21 DIAGNOSIS — E1140 Type 2 diabetes mellitus with diabetic neuropathy, unspecified: Secondary | ICD-10-CM | POA: Diagnosis not present

## 2021-01-21 DIAGNOSIS — K8689 Other specified diseases of pancreas: Secondary | ICD-10-CM | POA: Diagnosis not present

## 2021-01-21 DIAGNOSIS — E782 Mixed hyperlipidemia: Secondary | ICD-10-CM

## 2021-01-21 MED ORDER — LOSARTAN POTASSIUM 25 MG PO TABS: 25.0000 mg | ORAL_TABLET | Freq: Every day | ORAL | 3 refills | Status: DC

## 2021-01-21 NOTE — Patient Instructions (Signed)
Please start taking Losartan 25 mg instead of evening dose of Hydrochlorothiazide.  Please continue taking other medications as prescribed.  Please continue to follow low carb and low salt diet and ambulate as tolerated.  Please check blood glucose regularly and follow up with Dr Dorris Fetch as scheduled.

## 2021-01-21 NOTE — Assessment & Plan Note (Signed)
On Plavix and B-blocker Nitroglycerin PRN for chest pain Follows up with Cardiologist

## 2021-01-21 NOTE — Progress Notes (Signed)
Established Patient Office Visit  Subjective:  Patient ID: [name], male    DOB: April 11, 1961  Age: 60 y.o. MRN: [id number]  CC:  Chief Complaint  Patient presents with  . Hypertension    Follow up    HPI DMARION PERFECT is a 60 year old male with past medical history of CAD status post CABG, hypertension, uncontrolled diabetes mellitus-on insulin, severe diabetic neuropathy, chronic pancreatitis, polycythemia vera, hyperlipidemia, cervical spinal stenosis and chronic back pain who presents for follow up of his chronic medical conditions.  BP is well-controlled. Takes medications regularly. Patient denies headache, dizziness, chest pain, dyspnea or palpitations. Discussed about starting ACEi/ARB for his HTN and DM nephropathy, he agrees.  DM: HbA1C is 9.2 now. He had visit with Dr Dorris Fetch today and his insulin dose was increased to 50 U BID. He also takes Metformin. Denies any polyuria or polyphagia currently.  His clavicular area pain and swelling have improved now.  Past Medical History:  Diagnosis Date  . Anemia   . Bypass graft stenosis ([place])    '06  . Cervical spinal stenosis 05/06/2020   C5-6 level  . Chronic gastritis   . Coronary artery disease    Patent Stent to Circ.  Patent coronary bypass grafts with a free radial graft to PDA and LIMA to the  diag.  . Diabetes mellitus   . Diabetic peripheral neuropathy ([place]) 05/06/2020  . Dyslipidemia    Poorly controlled, probably related to his DM (Some of this is secondary to his inability to afford medications).  . ED (erectile dysfunction)   . Esophageal yeast infection ([place])   . Metaplasia of esophagus   . Pancreatitis chronic    Pseudocyst  . Polycythemia vera(238.4) 04/26/2012    Past Surgical History:  Procedure Laterality Date  . APPENDECTOMY    . CARDIAC CATHETERIZATION    . CHOLECYSTECTOMY    . COLONOSCOPY  07/31/1985  . COLONOSCOPY N/A 03/11/2016   Procedure: COLONOSCOPY;  Surgeon: Rogene Houston, MD;   Location: [facility];  Service: Endoscopy;  Laterality: N/A;  . CORONARY ANGIOPLASTY WITH STENT PLACEMENT  2006  . CORONARY ARTERY BYPASS GRAFT  11/24/2004  . ESOPHAGOGASTRODUODENOSCOPY  09/19/01  . ESOPHAGOGASTRODUODENOSCOPY N/A 03/11/2016   Procedure: ESOPHAGOGASTRODUODENOSCOPY (EGD);  Surgeon: Rogene Houston, MD;  Location: [facility];  Service: Endoscopy;  Laterality: N/A;  12:00  . FINGER TENDON REPAIR     in middle finger  . KNEE SURGERY     left knee  . NASAL SEPTUM SURGERY      Family History  Problem Relation Age of Onset  . Coronary artery disease Mother        strong family history of  . Heart attack Mother        dying of (at age 71)  . Diabetes Mother   . Diabetes Sister   . Diabetes Brother   . Pancreatitis Brother   . Healthy Daughter   . Hyperlipidemia Daughter   . Heart disease Brother   . Hypertension Brother   . Hyperlipidemia Brother     Social History   Socioeconomic History  . Marital status: Married    Spouse name: Not on file  . Number of children: Not on file  . Years of education: Not on file  . Highest education level: Not on file  Occupational History  . Not on file  Tobacco Use  . Smoking status: Never Smoker  . Smokeless tobacco: Never Used  Substance and Sexual Activity  .  Alcohol use: Yes    Comment: Patient may drink a beer or glass of wine a couple times a month  . Drug use: No  . Sexual activity: Not on file  Other Topics Concern  . Not on file  Social History Narrative   The patient lives in [place] with his wife.  He is     disabled secondary to coronary artery disease and pancreatitis.  He does     not smoke, he does not drink alcohol.  There is no drug use.    Social Determinants of Health   Financial Resource Strain: Low Risk   . Difficulty of Paying Living Expenses: Not hard at all  Food Insecurity: No Food Insecurity  . Worried About Charity fundraiser in the Last Year: Never true  . Ran Out of Food in  the Last Year: Never true  Transportation Needs: No Transportation Needs  . Lack of Transportation (Medical): No  . Lack of Transportation (Non-Medical): No  Physical Activity: Insufficiently Active  . Days of Exercise per Week: 3 days  . Minutes of Exercise per Session: 20 min  Stress: No Stress Concern Present  . Feeling of Stress : Only a little  Social Connections: Moderately Isolated  . Frequency of Communication with Friends and Family: More than three times a week  . Frequency of Social Gatherings with Friends and Family: Once a week  . Attends Religious Services: Never  . Active Member of Clubs or Organizations: No  . Attends Archivist Meetings: Never  . Marital Status: Married  Human resources officer Violence: Not on file    Outpatient Medications Prior to Visit  Medication Sig Dispense Refill  . Cholecalciferol 25 MCG (1000 UT) tablet Take 4,000 Units by mouth daily.    . clopidogrel (PLAVIX) 75 MG tablet TAKE ONE TABLET BY MOUTH ONCE DAILY AS A BLOOD THINNER. 90 tablet 0  . Continuous Blood Gluc Sensor (FREESTYLE LIBRE 2 SENSOR) MISC USE TO CHECK BLOOD SUGAR AS DIRECTED. 1 each 1  . diazepam (VALIUM) 10 MG tablet Take 10 mg by mouth at bedtime as needed for anxiety or sleep. As Needed    . ezetimibe (ZETIA) 10 MG tablet TAKE ONE TABLET BY MOUTH ONCE DAILY. 90 tablet 0  . gabapentin (NEURONTIN) 400 MG capsule TAKE (1) CAPSULE BY MOUTH TWICE DAILY. 60 capsule 0  . hydrochlorothiazide (HYDRODIURIL) 25 MG tablet TAKE (1) TABLET BY MOUTH TWICE DAILY FOR HIGH BLOOD PRESSURE. (Patient taking differently: Take 25 mg by mouth daily.) 180 tablet 0  . HYDROcodone-acetaminophen (NORCO) 10-325 MG tablet TAKE (1) TABLET BY MOUTH FOUR TIMES DAILY AS NEEDED FOR SEVERE PAIN. 120 tablet 0  . insulin NPH-regular Human (NOVOLIN 70/30) (70-30) 100 UNIT/ML injection Inject 50 Units into the skin 2 (two) times daily before a meal. When glucose is above 90 and eating    . lansoprazole  (PREVACID) 30 MG capsule Take 1 capsule (30 mg total) by mouth 2 (two) times daily before a meal. (Patient taking differently: Take 30 mg by mouth 2 (two) times daily before a meal. Pt states he takes 4 pills a week) 60 capsule 3  . metFORMIN (GLUCOPHAGE XR) 500 MG 24 hr tablet Take 1 tablet (500 mg total) by mouth daily with breakfast. 90 tablet 1  . methocarbamol (ROBAXIN) 500 MG tablet Take 1 tablet (500 mg total) by mouth every 8 (eight) hours as needed for muscle spasms. 30 tablet 2  . metoprolol tartrate (LOPRESSOR) 100 MG tablet Take  25 mg by mouth 2 (two) times daily.     . nitroGLYCERIN (NITROSTAT) 0.4 MG SL tablet Place 0.4 mg under the tongue every 5 (five) minutes as needed for chest pain. Reported on 02/26/2016    . NON FORMULARY Domperidone 10 mg Patient takes 2 by mouth daily    . omega-3 acid ethyl esters (LOVAZA) 1 g capsule Take 2 capsules by mouth twice daily 360 capsule 0  . ondansetron (ZOFRAN) 4 MG tablet Take 4 mg by mouth every 8 (eight) hours as needed for nausea or vomiting.    . Pancrelipase, Lip-Prot-Amyl, 6000 units CPEP Take by mouth as needed.     Marland Kitchen PARoxetine (PAXIL) 10 MG tablet TAKE ONE TABLET BY MOUTH ONCE DAILY FOR DEPRESSION. 90 tablet 0  . promethazine (PHENERGAN) 25 MG tablet Take 25 mg by mouth every 8 (eight) hours as needed for nausea or vomiting.    . sildenafil (VIAGRA) 50 MG tablet Take 1 tablet (50 mg total) by mouth daily as needed for erectile dysfunction. 10 tablet 3  . predniSONE (DELTASONE) 20 MG tablet Take 1 tablet (20 mg total) by mouth daily with breakfast. (Patient not taking: Reported on 01/21/2021) 5 tablet 0   No facility-administered medications prior to visit.    Allergies  Allergen Reactions  . Iodinated Diagnostic Agents Hives, Itching and Other (See Comments)    Rash, nausea and flushing Other reaction(s): Other Burning, itching Burning, itching Other reaction(s): Angioedema (ALLERGY/intolerance)   . Metoclopramide Hcl Hives     Patient became very spastic  . Propofol Other (See Comments)    Other reaction(s): Other (See Comments) Pancreatic attack. Other reaction(s): Other (See Comments) Pancreatic attack. Pancreatic attack.   . Iohexol      Desc: HIVES,SOB NEEDS PREP.MEDS   . Propofol Other (See Comments)    Pancreatic attack.  . Sulfonamide Derivatives     Large hives  . Enalapril Cough and Other (See Comments)  . Penicillins Rash    Has patient had a PCN reaction causing immediate rash, facial/tongue/throat swelling, SOB or lightheadedness with hypotension: Yes Has patient had a PCN reaction causing severe rash involving mucus membranes or skin necrosis: No Has patient had a PCN reaction that required hospitalization No Has patient had a PCN reaction occurring within the last 10 years: No. If all of the above answers are "NO", then may proceed with Cephalosporin use.   . Sulfa Antibiotics Rash    Other Reaction: Not Assessed    ROS Review of Systems  Constitutional: Negative for chills and fever.  HENT: Negative for congestion and sore throat.   Eyes: Negative for pain and discharge.  Respiratory: Negative for cough and shortness of breath.   Cardiovascular: Negative for palpitations.  Gastrointestinal: Negative for constipation, diarrhea, nausea and vomiting.  Endocrine: Negative for polydipsia and polyuria.  Genitourinary: Negative for dysuria and hematuria.  Musculoskeletal: Positive for arthralgias, back pain and neck pain. Negative for neck stiffness.  Skin: Negative for rash.  Neurological: Positive for numbness (B/l LE, intermittent). Negative for dizziness, weakness and headaches.  Psychiatric/Behavioral: Negative for agitation and behavioral problems.      Objective:    Physical Exam Vitals reviewed.  Constitutional:      General: He is not in acute distress.    Appearance: He is not diaphoretic.  HENT:     Head: Normocephalic and atraumatic.     Nose: Nose normal.      Mouth/Throat:     Mouth: Mucous membranes are moist.  Eyes:     General: No scleral icterus.    Extraocular Movements: Extraocular movements intact.  Cardiovascular:     Rate and Rhythm: Normal rate and regular rhythm.     Pulses: Normal pulses.     Heart sounds: Normal heart sounds. No murmur heard.   Pulmonary:     Breath sounds: Normal breath sounds. No wheezing or rales.  Musculoskeletal:     Cervical back: Neck supple. No tenderness.     Right lower leg: No edema.     Left lower leg: No edema.  Skin:    General: Skin is warm.     Findings: No rash.  Neurological:     General: No focal deficit present.     Mental Status: He is alert and oriented to person, place, and time.     Sensory: No sensory deficit.     Motor: No weakness.  Psychiatric:        Mood and Affect: Mood normal.        Behavior: Behavior normal.     BP 131/83 (BP Location: Right Arm, Patient Position: Sitting, Cuff Size: Normal)   Pulse 67   Temp (!) 97.5 F (36.4 C) (Temporal)   Ht 5' 9.5" (1.765 m)   Wt 174 lb (78.9 kg)   SpO2 99%   BMI 25.33 kg/m  Wt Readings from Last 3 Encounters:  01/21/21 174 lb (78.9 kg)  01/21/21 174 lb 12.8 oz (79.3 kg)  12/12/20 171 lb (77.6 kg)     There are no preventive care reminders to display for this patient.  There are no preventive care reminders to display for this patient.  Lab Results  Component Value Date   TSH 1.870 01/14/2021   Lab Results  Component Value Date   WBC 5.5 01/14/2021   HGB 14.0 01/14/2021   HCT 45.2 01/14/2021   MCV 77 (L) 01/14/2021   PLT 232 01/14/2021   Lab Results  Component Value Date   NA 137 01/14/2021   K 4.2 01/14/2021   CO2 21 01/14/2021   GLUCOSE 171 (H) 01/14/2021   BUN 20 01/14/2021   CREATININE 1.26 01/14/2021   BILITOT 0.5 01/14/2021   ALKPHOS 55 01/14/2021   AST 23 01/14/2021   ALT 17 01/14/2021   PROT 6.6 01/14/2021   ALBUMIN 4.2 01/14/2021   CALCIUM 9.4 01/14/2021   ANIONGAP 13 01/08/2018    EGFR 66 01/14/2021   Lab Results  Component Value Date   CHOL 229 (H) 01/14/2021   Lab Results  Component Value Date   HDL 34 (L) 01/14/2021   Lab Results  Component Value Date   LDLCALC 96 01/14/2021   Lab Results  Component Value Date   TRIG 592 ([facility]) 01/14/2021   Lab Results  Component Value Date   CHOLHDL 6.7 (H) 01/14/2021   Lab Results  Component Value Date   HGBA1C 9.2 (H) 01/14/2021      Assessment & Plan:   Problem List Items Addressed This Visit      Cardiovascular and Mediastinum   Essential hypertension, benign - Primary    BP Readings from Last 1 Encounters:  01/21/21 131/83   Well-controlled with HCTZ and Metoprolol (mainly for CAD) Started Losartan instead of evening dose of HCTZ Counseled for compliance with the medications Advised DASH diet and moderate exercise/walking, at least 150 mins/week       Relevant Medications   losartan (COZAAR) 25 MG tablet   Coronary artery disease of native artery of native  heart with stable angina pectoris (HCC)    On Plavix and B-blocker Nitroglycerin PRN for chest pain Follows up with Cardiologist      Relevant Medications   losartan (COZAAR) 25 MG tablet     Endocrine   Diabetic peripheral neuropathy (HCC)    Long-standing, severe b/l LE pain On Gabapentin 400 mg BID and Norco PRN SSRI and Valium for chronic pain and anxiety related to neuropathy      Relevant Medications   losartan (COZAAR) 25 MG tablet   Type 2 diabetes mellitus with diabetic neuropathy, with long-term current use of insulin (HCC)    HbA1C: 9.2 On Novolin 50 U BID and Metformin, f/u with Dr Dorris Fetch Advised to follow diabetic diet Diabetic eye exam: Advised to follow up with Ophthalmology for diabetic eye exam       Relevant Medications   losartan (COZAAR) 25 MG tablet     Other   H/O chronic pancreatitis    Related to previous episode of 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 Laural Golden         Meds ordered this encounter  Medications  . losartan (COZAAR) 25 MG tablet    Sig: Take 1 tablet (25 mg total) by mouth daily.    Dispense:  30 tablet    Refill:  3    Follow-up: Return in about 4 months (around 05/23/2021) for Annual physical.    Lindell Spar, MD

## 2021-01-21 NOTE — Assessment & Plan Note (Addendum)
BP Readings from Last 1 Encounters:  01/21/21 131/83   Well-controlled with HCTZ and Metoprolol (mainly for CAD) Started Losartan instead of evening dose of HCTZ Counseled for compliance with the medications Advised DASH diet and moderate exercise/walking, at least 150 mins/week

## 2021-01-21 NOTE — Assessment & Plan Note (Signed)
HbA1C: 9.2 On Novolin 50 U BID and Metformin, f/u with Dr Dorris Fetch Advised to follow diabetic diet Diabetic eye exam: Advised to follow up with Ophthalmology for diabetic eye exam

## 2021-01-21 NOTE — Assessment & Plan Note (Signed)
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 Laural Golden

## 2021-01-21 NOTE — Progress Notes (Signed)
01/21/2021, 3:14 PM   Endocrinology follow-up note  Subjective:    Patient ID: [name], male    DOB: [birth year].  [name] is being seen in  follow-up after he was seen in consultation for management of currently uncontrolled symptomatic diabetes, hyperlipidemia, exocrine pancreatic insufficiency. PMD:  Lindell Spar, MD.   Past Medical History:  Diagnosis Date  . Anemia   . 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 Cervical spinal stenosis 05/06/2020   C5-6 level  . Chronic gastritis   . Coronary artery disease    Patent Stent to Circ.  Patent coronary bypass grafts with a free radial graft to PDA and LIMA to the  diag.  . Diabetes mellitus   . Diabetic peripheral neuropathy ([place]) 05/06/2020  . Dyslipidemia    Poorly controlled, probably related to his DM (Some of this is secondary to his inability to afford medications).  . ED (erectile dysfunction)   . Esophageal yeast infection ([place])   . Metaplasia of esophagus   . Pancreatitis chronic    Pseudocyst  . Polycythemia vera(238.4) 04/26/2012    Past Surgical History:  Procedure Laterality Date  . APPENDECTOMY    . CARDIAC CATHETERIZATION    . CHOLECYSTECTOMY    . COLONOSCOPY  07/31/1985  . COLONOSCOPY N/A 03/11/2016   Procedure: COLONOSCOPY;  Surgeon: Rogene Houston, MD;  Location: [facility];  Service: Endoscopy;  Laterality: N/A;  . CORONARY ANGIOPLASTY WITH STENT PLACEMENT  2006  . CORONARY ARTERY BYPASS GRAFT  11/24/2004  . ESOPHAGOGASTRODUODENOSCOPY  09/19/01  . ESOPHAGOGASTRODUODENOSCOPY N/A 03/11/2016   Procedure: ESOPHAGOGASTRODUODENOSCOPY (EGD);  Surgeon: Rogene Houston, MD;  Location: [facility];  Service: Endoscopy;  Laterality: N/A;  12:00  . FINGER TENDON REPAIR     in middle finger  . KNEE SURGERY     left knee  . NASAL SEPTUM SURGERY      Social History   Socioeconomic History  . Marital status: Married    Spouse name: Not on file  . Number of children:  Not on file  . Years of education: Not on file  . Highest education level: Not on file  Occupational History  . Not on file  Tobacco Use  . Smoking status: Never Smoker  . Smokeless tobacco: Never Used  Substance and Sexual Activity  . Alcohol use: Yes    Comment: Patient may drink a beer or glass of wine a couple times a month  . Drug use: No  . Sexual activity: Not on file  Other Topics Concern  . Not on file  Social History Narrative   The patient lives in [place] with his wife.  He is     disabled secondary to coronary artery disease and pancreatitis.  He does     not smoke, he does not drink alcohol.  There is no drug use.    Social Determinants of Health   Financial Resource Strain: Low Risk   . Difficulty of Paying Living Expenses: Not hard at all  Food Insecurity: No Food Insecurity  . Worried About Charity fundraiser in the Last Year: Never true  . Ran Out of Food in the Last Year: Never true  Transportation Needs: No Transportation Needs  . Lack of Transportation (Medical): No  . Lack of Transportation (Non-Medical): No  Physical Activity: Insufficiently Active  . Days of Exercise per Week: 3 days  . Minutes of  Exercise per Session: 20 min  Stress: No Stress Concern Present  . Feeling of Stress : Only a little  Social Connections: Moderately Isolated  . Frequency of Communication with Friends and Family: More than three times a week  . Frequency of Social Gatherings with Friends and Family: Once a week  . Attends Religious Services: Never  . Active Member of Clubs or Organizations: No  . Attends Archivist Meetings: Never  . Marital Status: Married    Family History  Problem Relation Age of Onset  . Coronary artery disease Mother        strong family history of  . Heart attack Mother        dying of (at age 44)  . Diabetes Mother   . Diabetes Sister   . Diabetes Brother   . Pancreatitis Brother   . Healthy Daughter   . Hyperlipidemia  Daughter   . Heart disease Brother   . Hypertension Brother   . Hyperlipidemia Brother     Outpatient Encounter Medications as of 01/21/2021  Medication Sig  . Cholecalciferol 25 MCG (1000 UT) tablet Take 4,000 Units by mouth daily.  . clopidogrel (PLAVIX) 75 MG tablet TAKE ONE TABLET BY MOUTH ONCE DAILY AS A BLOOD THINNER.  Marland Kitchen Continuous Blood Gluc Sensor (FREESTYLE LIBRE 2 SENSOR) MISC USE TO CHECK BLOOD SUGAR AS DIRECTED.  Marland Kitchen diazepam (VALIUM) 10 MG tablet Take 10 mg by mouth at bedtime as needed for anxiety or sleep. As Needed  . ezetimibe (ZETIA) 10 MG tablet TAKE ONE TABLET BY MOUTH ONCE DAILY.  Marland Kitchen gabapentin (NEURONTIN) 400 MG capsule TAKE (1) CAPSULE BY MOUTH TWICE DAILY.  . hydrochlorothiazide (HYDRODIURIL) 25 MG tablet TAKE (1) TABLET BY MOUTH TWICE DAILY FOR HIGH BLOOD PRESSURE. (Patient taking differently: Take 25 mg by mouth daily.)  . HYDROcodone-acetaminophen (NORCO) 10-325 MG tablet TAKE (1) TABLET BY MOUTH FOUR TIMES DAILY AS NEEDED FOR SEVERE PAIN.  Marland Kitchen insulin NPH-regular Human (NOVOLIN 70/30) (70-30) 100 UNIT/ML injection Inject 50 Units into the skin 2 (two) times daily before a meal. When glucose is above 90 and eating  . lansoprazole (PREVACID) 30 MG capsule Take 1 capsule (30 mg total) by mouth 2 (two) times daily before a meal. (Patient taking differently: Take 30 mg by mouth 2 (two) times daily before a meal. Pt states he takes 4 pills a week)  . metFORMIN (GLUCOPHAGE XR) 500 MG 24 hr tablet Take 1 tablet (500 mg total) by mouth daily with breakfast.  . methocarbamol (ROBAXIN) 500 MG tablet Take 1 tablet (500 mg total) by mouth every 8 (eight) hours as needed for muscle spasms.  . metoprolol tartrate (LOPRESSOR) 100 MG tablet Take 25 mg by mouth 2 (two) times daily.   . nitroGLYCERIN (NITROSTAT) 0.4 MG SL tablet Place 0.4 mg under the tongue every 5 (five) minutes as needed for chest pain. Reported on 02/26/2016  . NON FORMULARY Domperidone 10 mg Patient takes 2 by mouth daily   . omega-3 acid ethyl esters (LOVAZA) 1 g capsule Take 2 capsules by mouth twice daily  . ondansetron (ZOFRAN) 4 MG tablet Take 4 mg by mouth every 8 (eight) hours as needed for nausea or vomiting.  . Pancrelipase, Lip-Prot-Amyl, 6000 units CPEP Take by mouth as needed.   Marland Kitchen PARoxetine (PAXIL) 10 MG tablet TAKE ONE TABLET BY MOUTH ONCE DAILY FOR DEPRESSION.  Marland Kitchen promethazine (PHENERGAN) 25 MG tablet Take 25 mg by mouth every 8 (eight) hours as needed for nausea or  vomiting.  . sildenafil (VIAGRA) 50 MG tablet Take 1 tablet (50 mg total) by mouth daily as needed for erectile dysfunction.  . [DISCONTINUED] predniSONE (DELTASONE) 20 MG tablet Take 1 tablet (20 mg total) by mouth daily with breakfast. (Patient not taking: Reported on 01/21/2021)   No facility-administered encounter medications on file as of 01/21/2021.    ALLERGIES: Allergies  Allergen Reactions  . Iodinated Diagnostic Agents Hives, Itching and Other (See Comments)    Rash, nausea and flushing Other reaction(s): Other Burning, itching Burning, itching Other reaction(s): Angioedema (ALLERGY/intolerance)   . Metoclopramide Hcl Hives    Patient became very spastic  . Propofol Other (See Comments)    Other reaction(s): Other (See Comments) Pancreatic attack. Other reaction(s): Other (See Comments) Pancreatic attack. Pancreatic attack.   . Iohexol      Desc: HIVES,SOB NEEDS PREP.MEDS   . Propofol Other (See Comments)    Pancreatic attack.  . Sulfonamide Derivatives     Large hives  . Enalapril Cough and Other (See Comments)  . Penicillins Rash    Has patient had a PCN reaction causing immediate rash, facial/tongue/throat swelling, SOB or lightheadedness with hypotension: Yes Has patient had a PCN reaction causing severe rash involving mucus membranes or skin necrosis: No Has patient had a PCN reaction that required hospitalization No Has patient had a PCN reaction occurring within the last 10 years: No. If all of the  above answers are "NO", then may proceed with Cephalosporin use.   . Sulfa Antibiotics Rash    Other Reaction: Not Assessed    VACCINATION STATUS:  There is no immunization history on file for this patient.  Diabetes He presents for his follow-up diabetic visit. Diabetes type: Pancreatic diabetes. His disease course has been worsening. There are no hypoglycemic associated symptoms. Pertinent negatives for hypoglycemia include no confusion, headaches, pallor or seizures. Pertinent negatives for diabetes include no chest pain, no fatigue, no polydipsia, no polyphagia, no polyuria and no weakness. There are no hypoglycemic complications. Symptoms are worsening. Diabetic complications include heart disease, impotence and peripheral neuropathy. Risk factors for coronary artery disease include dyslipidemia, diabetes mellitus, family history, male sex, hypertension and sedentary lifestyle. Current diabetic treatments: Is currently on Novolin 70/30 49 units nightly, glipizide 10 mg p.o. daily, Metformin 1000 mg p.o. twice daily. His weight is stable. He is following a generally unhealthy diet. When asked about meal planning, he reported none. He has not had a previous visit with a dietitian. He participates in exercise intermittently. His home blood glucose trend is increasing steadily. His breakfast blood glucose range is generally 140-180 mg/dl. His lunch blood glucose range is generally 140-180 mg/dl. His dinner blood glucose range is generally 140-180 mg/dl. His bedtime blood glucose range is generally 140-180 mg/dl. His overall blood glucose range is 140-180 mg/dl. (Due to exposure to high-dose steroids in the interval, he had a course of persistent hyperglycemia which led to increase in his A1c to 9.2%.  More recently he is doing better.  He is CGM AGP report shows 53% time range, 46% above range, no hypoglycemia.  His average blood glucose is 177 over the last 14 days.) An ACE inhibitor/angiotensin II  receptor blocker is not being taken. Eye exam is current.  Hyperlipidemia This is a chronic problem. The current episode started more than 1 year ago. Recent lipid tests were reviewed and are high. Pertinent negatives include no chest pain, myalgias or shortness of breath. Current antihyperlipidemic treatment includes bile acid sequestrants (He has statin  intolerance.). Risk factors for coronary artery disease include dyslipidemia, diabetes mellitus, family history, hypertension, male sex and a sedentary lifestyle.  Hypertension This is a chronic problem. The current episode started more than 1 year ago. The problem is controlled. Pertinent negatives include no chest pain, headaches, neck pain, palpitations or shortness of breath. Risk factors for coronary artery disease include dyslipidemia, diabetes mellitus, family history, male gender and sedentary lifestyle. Past treatments include diuretics and beta blockers. Hypertensive end-organ damage includes CAD/MI.     Review of Systems  Constitutional: Negative for chills, fatigue, fever and unexpected weight change.  HENT: Negative for dental problem, mouth sores and trouble swallowing.   Eyes: Negative for visual disturbance.  Respiratory: Negative for cough, choking, chest tightness, shortness of breath and wheezing.   Cardiovascular: Negative for chest pain, palpitations and leg swelling.  Gastrointestinal: Negative for abdominal distention, abdominal pain, constipation, diarrhea, nausea and vomiting.  Endocrine: Negative for polydipsia, polyphagia and polyuria.  Genitourinary: Positive for impotence. Negative for dysuria, flank pain, hematuria and urgency.  Musculoskeletal: Negative for back pain, gait problem, myalgias and neck pain.  Skin: Negative for pallor, rash and wound.  Neurological: Negative for seizures, syncope, weakness, numbness and headaches.  Psychiatric/Behavioral: Negative for confusion and dysphoric mood.    Objective:     Vitals with BMI 01/21/2021 01/21/2021 12/12/2020  Height 5' 9.5" 5' 9.5" 5' 9.5"  Weight 174 lbs 174 lbs 13 oz 171 lbs  BMI 25.34 77.82 42.3  Systolic 536 144 315  Diastolic 83 84 84  Pulse 67 60 75    BP 138/84   Pulse 60   Ht 5' 9.5" (1.765 m)   Wt 174 lb 12.8 oz (79.3 kg)   BMI 25.44 kg/m   Wt Readings from Last 3 Encounters:  01/21/21 174 lb (78.9 kg)  01/21/21 174 lb 12.8 oz (79.3 kg)  12/12/20 171 lb (77.6 kg)     Physical Exam Constitutional:      General: He is not in acute distress.    Appearance: He is well-developed.  HENT:     Head: Normocephalic and atraumatic.  Neck:     Thyroid: No thyromegaly.     Trachea: No tracheal deviation.  Cardiovascular:     Rate and Rhythm: Normal rate.     Pulses:          Dorsalis pedis pulses are 1+ on the right side and 1+ on the left side.       Posterior tibial pulses are 1+ on the right side and 1+ on the left side.     Heart sounds: Normal heart sounds, S1 normal and S2 normal. No murmur heard. No gallop.   Pulmonary:     Effort: No respiratory distress.     Breath sounds: Normal breath sounds. No wheezing.  Abdominal:     General: Bowel sounds are normal. There is no distension.     Palpations: Abdomen is soft.     Tenderness: There is no abdominal tenderness. There is no guarding.  Musculoskeletal:     Right shoulder: No swelling or deformity.     Cervical back: Normal range of motion and neck supple.  Skin:    General: Skin is warm and dry.     Findings: No rash.     Nails: There is no clubbing.  Neurological:     Mental Status: He is alert and oriented to person, place, and time.     Cranial Nerves: No cranial nerve deficit.  Sensory: No sensory deficit.     Gait: Gait normal.     Deep Tendon Reflexes: Reflexes are normal and symmetric.  Psychiatric:        Speech: Speech normal.        Behavior: Behavior normal. Behavior is cooperative.        Thought Content: Thought content normal.        Judgment:  Judgment normal.    CMP ( most recent) CMP     Component Value Date/Time   NA 137 01/14/2021 1348   K 4.2 01/14/2021 1348   CL 98 01/14/2021 1348   CO2 21 01/14/2021 1348   GLUCOSE 171 (H) 01/14/2021 1348   GLUCOSE 303 (H) 08/27/2018 0403   BUN 20 01/14/2021 1348   CREATININE 1.26 01/14/2021 1348   CREATININE 1.27 07/08/2017 1343   CALCIUM 9.4 01/14/2021 1348   PROT 6.6 01/14/2021 1348   ALBUMIN 4.2 01/14/2021 1348   AST 23 01/14/2021 1348   ALT 17 01/14/2021 1348   ALKPHOS 55 01/14/2021 1348   BILITOT 0.5 01/14/2021 1348   GFRNONAA 54 (L) 06/27/2020 1500   GFRAA 62 06/27/2020 1500     Diabetic Labs (most recent): Lab Results  Component Value Date   HGBA1C 9.2 (H) 01/14/2021   HGBA1C 8.2 09/23/2020   HGBA1C 7.9 (A) 05/29/2020     Lipid Panel ( most recent) Lipid Panel     Component Value Date/Time   CHOL 229 (H) 01/14/2021 1348   TRIG 592 (HH) 01/14/2021 1348   HDL 34 (L) 01/14/2021 1348   CHOLHDL 6.7 (H) 01/14/2021 1348   CHOLHDL 6.9 (H) 08/19/2016 1259   VLDL NOT CALC 08/19/2016 1259   LDLCALC 96 01/14/2021 1348   LDLDIRECT 110 08/19/2016 1259   LABVLDL 99 (H) 01/14/2021 1348       Assessment & Plan:   1.  Uncontrolled diabetes due to pancreatic insufficiency , CKD.   - [name] has currently uncontrolled symptomatic diabetes as a result of pancreatic insufficiency 60 years of age,  with most recent A1c of 7.9 %. Recent labs reviewed.  His medical history is long and complicated. Due to exposure to high-dose steroids in the interval, he had a course of persistent hyperglycemia which led to increase in his A1c to 9.2%.  More recently he is doing better.  He is CGM AGP report shows 53% time range, 46% above range, no hypoglycemia.  His average blood glucose is 177 over the last 14 days.  - I had a long discussion with him about the pathology behind his diabetes and its complications. -his diabetes is complicated by coronary artery disease which  required intervention, ED, peripheral neuropathy and he remains at a high risk for more acute and chronic complications which include CAD, CVA, CKD, retinopathy, and neuropathy. These are all discussed in detail with him.  - I have counseled him on diet  and weight management  by adopting a carbohydrate restricted/protein rich diet. Patient is encouraged to switch to  unprocessed or minimally processed     complex starch and increased protein intake (animal or plant source), fruits, and vegetables. -  he is advised to stick to a routine mealtimes to eat 3 meals  a day and avoid unnecessary snacks ( to snack only to correct hypoglycemia).   - he acknowledges that there is a room for improvement in his food and drink choices. - Suggestion is made for him to avoid simple carbohydrates  from his diet including Cakes,  Sweet Desserts, Ice Cream, Soda (diet and regular), Sweet Tea, Candies, Chips, Cookies, Store Bought Juices, Alcohol in Excess of  1-2 drinks a day, Artificial Sweeteners,  Coffee Creamer, and "Sugar-free" Products, Lemonade. This will help patient to have more stable blood glucose profile and potentially avoid unintended weight gain.   - he will be scheduled with Jearld Fenton, RDN, CDE for diabetes education.  - I have approached him with the following individualized plan to manage  his diabetes and patient agrees:   -In light of his diabetes being due to his pancreatic insufficiency, he will continue to need multiple daily injections of insulin  in order for him to achieve and maintain control of diabetes to target.    -He has done reasonably well with premixed insulin twice a day.  However, he will need a higher dose of insulin to achieve control of diabetes to target.    -I discussed and increase his Novolin 70/30 to 50  units twice a day-daily with breakfast and supper  for premeal blood glucose readings above 90 mg per DL, associated with documenting glucose profile at least 4  times a day before meals and at bedtime using his CGM.  - he is warned not to take insulin without proper monitoring per orders.  - he is encouraged to call clinic for blood glucose levels less than 70 or above 200 mg /dl. -He has tolerated and benefited from low-dose metformin.  He is advised to continue Metformin 500 mg XR p.o. daily after breakfast.  -He will be considered for insulin analogs utilizing Lantus and NovoLog if necessary on next visit. - he is not a suitable candidate for incretin therapy.    - Specific targets for  A1c;  LDL, HDL,  and Triglycerides were discussed with the patient.  2) Blood Pressure /Hypertension: His blood pressure is controlled to target.  He will not tolerate any additional medications with ACE inhibitors or ARB. he is advised to continue his current medications including hydrochlorothiazide, and metoprolol. His urine microalbumin to creatinine ratio is abnormal.  He will have a repeat urine microalbumin next visit.  3) Lipids/Hyperlipidemia:   Review of his recent lipid panel showed uncontrolled triglyceride levels to 592, although improving from 900+.  His LDL has improved to 96 from 156.    He does not tolerate statins.  He is on Lovaza 2 g p.o. twice daily, advised to continue.  He admits to dietary indiscretion.  He is also advised to avoid butter and fried food.      4)  Weight/Diet:  Body mass index is 25.44 kg/m.  -     he is not a candidate for weight loss. I discussed with him the fact that loss of 5 - 10% of his  current body weight will have the most impact on his diabetes management.  Exercise, and detailed carbohydrates information provided  -  detailed on discharge instructions.  5) Chronic Care/Health Maintenance:  -he  Is not  on ACEI/ARB and Statin medications and  is encouraged to initiate and continue to follow up with Ophthalmology, Dentist,  Podiatrist at least yearly or according to recommendations, and advised to   stay away from  smoking. I have recommended yearly flu vaccine and pneumonia vaccine at least every 5 years; moderate intensity exercise for up to 150 minutes weekly; and  sleep for at least 7 hours a day.  6) pancreatic insufficiency -He is advised to continue his enzymatic replacements currently pancrelipase 6000 units with  meals and snacks.     - he is  advised to maintain close follow up with Lindell Spar, MD for primary care needs, as well as his other providers for optimal and coordinated care.   I spent 42 minutes in the care of the patient today including review of labs from [facility], Lipids, Thyroid Function, Hematology (current and previous including abstractions from other facilities); face-to-face time discussing  his blood glucose readings/logs, discussing hypoglycemia and hyperglycemia episodes and symptoms, medications doses, his options of short and long term treatment based on the latest standards of care / guidelines;  discussion about incorporating lifestyle medicine;  and documenting the encounter.    Please refer to Patient Instructions for Blood Glucose Monitoring and Insulin/Medications Dosing Guide"  in media tab for additional information. Please  also refer to " Patient Self Inventory" in the Media  tab for reviewed elements of pertinent patient history.  [name] participated in the discussions, expressed understanding, and voiced agreement with the above plans.  All questions were answered to his satisfaction. he is encouraged to contact clinic should he have any questions or concerns prior to his return visit.    Follow up plan: - Return in about 3 months (around 04/23/2021) for Urine MA - NV, A1c -NV, F/U with Meter and Logs Only - no Labs.  Glade Lloyd, MD [facility] [address] [place], [place] [address] Phone: [phone]  Fax: [phone]    01/21/2021, 3:14 PM  This note was partially dictated with voice  recognition software. Similar sounding words can be transcribed inadequately or may not  be corrected upon review.

## 2021-01-21 NOTE — Assessment & Plan Note (Signed)
Long-standing, severe b/l LE pain On Gabapentin 400 mg BID and Norco PRN SSRI and Valium for chronic pain and anxiety related to neuropathy

## 2021-01-21 NOTE — Patient Instructions (Signed)
                                     Advice for Weight Management  -For most of us the best way to lose weight is by diet management. Generally speaking, diet management means consuming less calories intentionally which over time brings about progressive weight loss.  This can be achieved more effectively by restricting carbohydrate consumption to the minimum possible.  So, it is critically important to know your numbers: how much calorie you are consuming and how much calorie you need. More importantly, our carbohydrates sources should be unprocessed or minimally processed complex starch food items.   Sometimes, it is important to balance nutrition by increasing protein intake (animal or plant source), fruits, and vegetables.  -Sticking to a routine mealtime to eat 3 meals a day and avoiding unnecessary snacks is shown to have a big role in weight control. Under normal circumstances, the only time we lose real weight is when we are hungry, so allow hunger to take place- hunger means no food between meal times, only water.  It is not advisable to starve.   -It is better to avoid simple carbohydrates including: Cakes, Sweet Desserts, Ice Cream, Soda (diet and regular), Sweet Tea, Candies, Chips, Cookies, Store Bought Juices, Alcohol in Excess of  1-2 drinks a day, Lemonade,  Artificial Sweeteners, Doughnuts, Coffee Creamers, "Sugar-free" Products, etc, etc.  This is not a complete list.....    -Consulting with certified diabetes educators is proven to provide you with the most accurate and current information on diet.  Also, you may be  interested in discussing diet options/exchanges , we can schedule a visit with [name], RDN, CDE for individualized nutrition education.  -Exercise: If you are able: 30 -60 minutes a day ,4 days a week, or 150 minutes a week.  The longer the better.  Combine stretch, strength, and aerobic activities.  If you were told in the  past that you have high risk for cardiovascular diseases, you may seek evaluation by your heart doctor prior to initiating moderate to intense exercise programs.                                  Additional Care Considerations for Diabetes   -Diabetes  is a chronic disease.  The most important care consideration is regular follow-up with your diabetes care provider with the goal being avoiding or delaying its complications and to take advantage of advances in medications and technology.    -Type 2 diabetes is known to coexist with other important comorbidities such as high blood pressure and high cholesterol.  It is critical to control not only the diabetes but also the high blood pressure and high cholesterol to minimize and delay the risk of complications including coronary artery disease, stroke, amputations, blindness, etc.    - Studies showed that people with diabetes will benefit from a class of medications known as ACE inhibitors and statins.  Unless there are specific reasons not to be on these medications, the standard of care is to consider getting one from these groups of medications at an optimal doses.  These medications are generally considered safe and proven to help protect the heart and the kidneys.    - People with diabetes are encouraged to initiate and maintain regular follow-up with eye doctors, foot   doctors, dentists , and if necessary heart and kidney doctors.     - It is highly recommended that people with diabetes quit smoking or stay away from smoking, and get yearly  flu vaccine and pneumonia vaccine at least every 5 years.  One other important lifestyle recommendation is to ensure adequate sleep - at least 6-7 hours of uninterrupted sleep at night.  -Exercise: If you are able: 30 -60 minutes a day, 4 days a week, or 150 minutes a week.  The longer the better.  Combine stretch, strength, and aerobic activities.  If you were told in the past that you have high risk for  cardiovascular diseases, you may seek evaluation by your heart doctor prior to initiating moderate to intense exercise programs.          

## 2021-02-02 ENCOUNTER — Telehealth: Payer: Self-pay | Admitting: *Deleted

## 2021-02-02 NOTE — Telephone Encounter (Signed)
Pt called needing refill on hydrocodone please advise

## 2021-02-03 ENCOUNTER — Other Ambulatory Visit: Payer: Self-pay | Admitting: Internal Medicine

## 2021-02-03 DIAGNOSIS — E1142 Type 2 diabetes mellitus with diabetic polyneuropathy: Secondary | ICD-10-CM

## 2021-02-03 MED ORDER — HYDROCODONE-ACETAMINOPHEN 10-325 MG PO TABS: ORAL_TABLET | ORAL | 0 refills | Status: DC

## 2021-02-14 ENCOUNTER — Other Ambulatory Visit: Payer: Self-pay | Admitting: Internal Medicine

## 2021-02-17 ENCOUNTER — Other Ambulatory Visit: Payer: Self-pay

## 2021-02-17 ENCOUNTER — Ambulatory Visit: Payer: PPO

## 2021-02-17 LAB — HM DIABETES EYE EXAM

## 2021-02-19 ENCOUNTER — Encounter: Payer: Self-pay | Admitting: *Deleted

## 2021-03-04 ENCOUNTER — Telehealth: Payer: Self-pay

## 2021-03-04 ENCOUNTER — Other Ambulatory Visit: Payer: Self-pay | Admitting: *Deleted

## 2021-03-04 ENCOUNTER — Other Ambulatory Visit: Payer: Self-pay | Admitting: Internal Medicine

## 2021-03-04 MED ORDER — FREESTYLE TEST VI STRP: ORAL_STRIP | 12 refills | Status: DC

## 2021-03-04 MED ORDER — ACCU-CHEK SOFTCLIX LANCETS MISC: 12 refills | Status: DC

## 2021-03-04 NOTE — Telephone Encounter (Signed)
Appointment scheduled 8/23.

## 2021-03-04 NOTE — Telephone Encounter (Signed)
Erlene Quan with [employer] called needing clarification on patient diabetic supplies ph# [phone]

## 2021-03-04 NOTE — Telephone Encounter (Signed)
Patient called this is not correct test strips ACCU-CHEK COMPACT PLUS test strip that was sent in to the pharmacy.  Patient Needing freestyle precison test strips and accu-chek soft lancets sent to [facility]  Also, the medicine losartan (COZAAR) 25 MG tablet takes at night time has been giving him a cough.  Side effect does say cough and he has been coughing since he has been taking this medication. Please contact patient back at [phone]

## 2021-03-04 NOTE — Telephone Encounter (Signed)
Lancets and new strips sent to [place] will need an appointment to discuss the losartan with a provider

## 2021-03-05 NOTE — Telephone Encounter (Signed)
Called [employer] they have resolved issue

## 2021-03-16 ENCOUNTER — Other Ambulatory Visit: Payer: Self-pay | Admitting: Internal Medicine

## 2021-03-16 DIAGNOSIS — E1142 Type 2 diabetes mellitus with diabetic polyneuropathy: Secondary | ICD-10-CM

## 2021-03-17 ENCOUNTER — Encounter (INDEPENDENT_AMBULATORY_CARE_PROVIDER_SITE_OTHER): Payer: Self-pay | Admitting: *Deleted

## 2021-03-18 ENCOUNTER — Other Ambulatory Visit: Payer: Self-pay | Admitting: Internal Medicine

## 2021-03-18 ENCOUNTER — Telehealth: Payer: Self-pay | Admitting: Internal Medicine

## 2021-03-18 DIAGNOSIS — E1142 Type 2 diabetes mellitus with diabetic polyneuropathy: Secondary | ICD-10-CM

## 2021-03-18 MED ORDER — GABAPENTIN 400 MG PO CAPS: ORAL_CAPSULE | ORAL | 0 refills | Status: DC

## 2021-03-18 NOTE — Telephone Encounter (Signed)
He just collected 60 tabs on 7/25. Will let [name] decide if he wants to send in a 90 day supply

## 2021-03-18 NOTE — Telephone Encounter (Signed)
Pt wants A 90 DAY SUPPLY OF THE GABAPENTIN TO BE CALLED IN.  Please advise if that is a problem ,,

## 2021-03-26 ENCOUNTER — Other Ambulatory Visit: Payer: Self-pay

## 2021-03-26 ENCOUNTER — Ambulatory Visit: Payer: PPO | Admitting: Internal Medicine

## 2021-03-26 ENCOUNTER — Encounter: Payer: Self-pay | Admitting: Internal Medicine

## 2021-03-26 ENCOUNTER — Ambulatory Visit (INDEPENDENT_AMBULATORY_CARE_PROVIDER_SITE_OTHER): Payer: PPO | Admitting: Internal Medicine

## 2021-03-26 VITALS — BP 154/89 | HR 78 | Temp 97.8°F | Resp 18 | Ht 69.5 in | Wt 169.8 lb

## 2021-03-26 DIAGNOSIS — N401 Enlarged prostate with lower urinary tract symptoms: Secondary | ICD-10-CM

## 2021-03-26 DIAGNOSIS — R3912 Poor urinary stream: Secondary | ICD-10-CM

## 2021-03-26 DIAGNOSIS — H9313 Tinnitus, bilateral: Secondary | ICD-10-CM

## 2021-03-26 DIAGNOSIS — N3000 Acute cystitis without hematuria: Secondary | ICD-10-CM | POA: Diagnosis not present

## 2021-03-26 MED ORDER — TAMSULOSIN HCL 0.4 MG PO CAPS: 0.4000 mg | ORAL_CAPSULE | Freq: Every day | ORAL | 0 refills | Status: DC

## 2021-03-26 MED ORDER — LEVOFLOXACIN 500 MG PO TABS: 500.0000 mg | ORAL_TABLET | Freq: Every day | ORAL | 0 refills | Status: DC

## 2021-03-26 NOTE — Progress Notes (Signed)
Acute Office Visit  Subjective:    Patient ID: [name], male    DOB: 01-01-1961, 60 y.o.   MRN: [id number]  Chief Complaint  Patient presents with   Acute Visit    Pt feels like he has been having prostate flare up when he was seeing dr Karie Kirks he was prescribing levofloxacin however he is out of this medication he is having a lot of pressure and also feeling full all the time     HPI Patient is in today for evaluation of dysuria and dark urine for last 1 week.  He started taking levofloxacin that he had from old prescription.  He reports working outdoors recently, which might have led to dehydration and dark urine.  He reports chronic urinary hesitancy and weak urine stream.  He was prescribed tamsulosin in the past by his previous PCP, but he has not been taking it regularly as he was concerned about his blood pressure.  He also reports history of deviated nasal septum, for which she had surgery and used to follow-up with ENT specialist.  He also reports chronic tinnitus in both ears and would like to see an ENT specialist now.  Denies any dizziness or balance problem.  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  Patent coronary bypass grafts with a free radial graft to PDA and LIMA to the  diag.   Diabetes mellitus    Diabetic peripheral neuropathy ([place]) 05/06/2020   Dyslipidemia    Poorly controlled, probably related to his DM (Some of this is secondary to his inability to afford medications).   ED (erectile dysfunction)    Esophageal yeast infection ([place])    Metaplasia of esophagus    Pancreatitis chronic    Pseudocyst   Polycythemia vera(238.4) 04/26/2012    Past Surgical History:  Procedure Laterality Date   APPENDECTOMY     CARDIAC CATHETERIZATION     CHOLECYSTECTOMY     COLONOSCOPY  07/31/1985    COLONOSCOPY N/A 03/11/2016   Procedure: COLONOSCOPY;  Surgeon: Rogene Houston, MD;  [facility];  Service: Endoscopy;  Laterality: N/A;   CORONARY ANGIOPLASTY WITH STENT PLACEMENT  2006   CORONARY ARTERY BYPASS GRAFT  11/24/2004   ESOPHAGOGASTRODUODENOSCOPY  09/19/01   ESOPHAGOGASTRODUODENOSCOPY N/A 03/11/2016   Procedure: ESOPHAGOGASTRODUODENOSCOPY (EGD);  Surgeon: Rogene Houston, MD;  Location: [facility];  Service: Endoscopy;  Laterality: N/A;  12:00   FINGER TENDON REPAIR     in middle finger   KNEE SURGERY     left knee   NASAL SEPTUM SURGERY      Family History  Problem Relation Age of Onset   Coronary artery disease Mother        strong family history of   Heart attack Mother        dying of (at age 29)   Diabetes Mother    Diabetes Sister    Diabetes Brother    Pancreatitis Brother    Healthy Daughter    Hyperlipidemia Daughter    Heart disease Brother    Hypertension Brother    Hyperlipidemia Brother     Social History   Socioeconomic History   Marital status: Married    Spouse name: Not on file   Number of  children: Not on file   Years of education: Not on file   Highest education level: Not on file  Occupational History   Not on file  Tobacco Use   Smoking status: Never   Smokeless tobacco: Never  Substance and Sexual Activity   Alcohol use: Yes    Comment: Patient may drink a beer or glass of wine a couple times a month   Drug use: No   Sexual activity: Not on file  Other Topics Concern   Not on file  Social History Narrative   The patient lives in [place] with his wife.  He is     disabled secondary to coronary artery disease and pancreatitis.  He does     not smoke, he does not drink alcohol.  There is no drug use.    Social Determinants of Health   Financial Resource Strain: Low Risk    Difficulty of Paying Living Expenses: Not hard at all  Food Insecurity: No Food Insecurity   Worried About Charity fundraiser in the  Last Year: Never true   [place] in the Last Year: Never true  Transportation Needs: No Transportation Needs   Lack of Transportation (Medical): No   Lack of Transportation (Non-Medical): No  Physical Activity: Insufficiently Active   Days of Exercise per Week: 3 days   Minutes of Exercise per Session: 20 min  Stress: No Stress Concern Present   Feeling of Stress : Only a little  Social Connections: Moderately Isolated   Frequency of Communication with Friends and Family: More than three times a week   Frequency of Social Gatherings with Friends and Family: Once a week   Attends Religious Services: Never   Marine scientist or Organizations: No   Attends Archivist Meetings: Never   Marital Status: Married  Human resources officer Violence: Not on file    Outpatient Medications Prior to Visit  Medication Sig Dispense Refill   Cholecalciferol 25 MCG (1000 UT) tablet Take 4,000 Units by mouth daily.     clopidogrel (PLAVIX) 75 MG tablet TAKE ONE TABLET BY MOUTH ONCE DAILY AS A BLOOD THINNER. 90 tablet 0   Continuous Blood Gluc Sensor (FREESTYLE LIBRE 2 SENSOR) MISC USE TO CHECK BLOOD SUGAR AS DIRECTED. 1 each 1   diazepam (VALIUM) 10 MG tablet Take 10 mg by mouth at bedtime as needed for anxiety or sleep. As Needed     ezetimibe (ZETIA) 10 MG tablet TAKE ONE TABLET BY MOUTH ONCE DAILY. 90 tablet 0   gabapentin (NEURONTIN) 400 MG capsule TAKE (1) CAPSULE BY MOUTH TWICE DAILY. 180 capsule 0   glucose blood (FREESTYLE TEST STRIPS) test strip Use as instructed 100 each 12   hydrochlorothiazide (HYDRODIURIL) 25 MG tablet TAKE (1) TABLET BY MOUTH TWICE DAILY FOR HIGH BLOOD PRESSURE. (Patient taking differently: Take 25 mg by mouth daily.) 180 tablet 0   HYDROcodone-acetaminophen (NORCO) 10-325 MG tablet TAKE (1) TABLET BY MOUTH FOUR TIMES DAILY AS NEEDED FOR SEVERE PAIN. 120 tablet 0   insulin NPH-regular Human (NOVOLIN 70/30) (70-30) 100 UNIT/ML injection Inject 50 Units into  the skin 2 (two) times daily before a meal. When glucose is above 90 and eating     lansoprazole (PREVACID) 30 MG capsule Take 1 capsule (30 mg total) by mouth 2 (two) times daily before a meal. (Patient taking differently: Take 30 mg by mouth 2 (two) times daily before a meal. Pt states he takes 4 pills a week)  60 capsule 3   losartan (COZAAR) 25 MG tablet Take 1 tablet (25 mg total) by mouth daily. 30 tablet 3   metFORMIN (GLUCOPHAGE XR) 500 MG 24 hr tablet Take 1 tablet (500 mg total) by mouth daily with breakfast. 90 tablet 1   methocarbamol (ROBAXIN) 500 MG tablet Take 1 tablet (500 mg total) by mouth every 8 (eight) hours as needed for muscle spasms. 30 tablet 2   metoprolol tartrate (LOPRESSOR) 100 MG tablet Take 25 mg by mouth 2 (two) times daily.      nitroGLYCERIN (NITROSTAT) 0.4 MG SL tablet Place 0.4 mg under the tongue every 5 (five) minutes as needed for chest pain. Reported on 02/26/2016     NON FORMULARY Domperidone 10 mg Patient takes 2 by mouth daily     omega-3 acid ethyl esters (LOVAZA) 1 g capsule Take 2 capsules by mouth twice daily 360 capsule 0   ondansetron (ZOFRAN) 4 MG tablet Take 4 mg by mouth every 8 (eight) hours as needed for nausea or vomiting.     Pancrelipase, Lip-Prot-Amyl, 6000 units CPEP Take by mouth as needed.      PARoxetine (PAXIL) 10 MG tablet TAKE ONE TABLET BY MOUTH ONCE DAILY FOR DEPRESSION. 90 tablet 0   promethazine (PHENERGAN) 25 MG tablet Take 25 mg by mouth every 8 (eight) hours as needed for nausea or vomiting.     sildenafil (VIAGRA) 50 MG tablet Take 1 tablet (50 mg total) by mouth daily as needed for erectile dysfunction. 10 tablet 3   Accu-Chek Softclix Lancets lancets Use as instructed 100 each 12   No facility-administered medications prior to visit.    Allergies  Allergen Reactions   Iodinated Diagnostic Agents Hives, Itching and Other (See Comments)    Rash, nausea and flushing Other reaction(s): Other Burning, itching Burning,  itching Other reaction(s): Angioedema (ALLERGY/intolerance)    Metoclopramide Hcl Hives    Patient became very spastic   Propofol Other (See Comments)    Other reaction(s): Other (See Comments) Pancreatic attack. Other reaction(s): Other (See Comments) Pancreatic attack. Pancreatic attack.    Iohexol      Desc: HIVES,SOB NEEDS PREP.MEDS    Propofol Other (See Comments)    Pancreatic attack.   Sulfonamide Derivatives     Large hives   Enalapril Cough and Other (See Comments)   Penicillins Rash    Has patient had a PCN reaction causing immediate rash, facial/tongue/throat swelling, SOB or lightheadedness with hypotension: Yes Has patient had a PCN reaction causing severe rash involving mucus membranes or skin necrosis: No Has patient had a PCN reaction that required hospitalization No Has patient had a PCN reaction occurring within the last 10 years: No. If all of the above answers are "NO", then may proceed with Cephalosporin use.    Sulfa Antibiotics Rash    Other Reaction: Not Assessed    Review of Systems  Constitutional:  Negative for chills and fever.  HENT:  Negative for congestion and sore throat.   Eyes:  Negative for pain and discharge.  Respiratory:  Negative for cough and shortness of breath.   Cardiovascular:  Negative for palpitations.  Gastrointestinal:  Negative for constipation, diarrhea, nausea and vomiting.  Endocrine: Negative for polydipsia and polyuria.  Genitourinary:  Positive for difficulty urinating and dysuria. Negative for hematuria.  Musculoskeletal:  Positive for arthralgias, back pain and neck pain. Negative for neck stiffness.  Skin:  Negative for rash.  Neurological:  Positive for numbness (B/l LE, intermittent). Negative for  dizziness, weakness and headaches.  Psychiatric/Behavioral:  Negative for agitation and behavioral problems.       Objective:    Physical Exam Vitals reviewed.  Constitutional:      General: He is not in acute  distress.    Appearance: He is not diaphoretic.  HENT:     Head: Normocephalic and atraumatic.     Nose: Nose normal.     Mouth/Throat:     Mouth: Mucous membranes are moist.  Eyes:     General: No scleral icterus.    Extraocular Movements: Extraocular movements intact.  Cardiovascular:     Rate and Rhythm: Normal rate and regular rhythm.     Pulses: Normal pulses.     Heart sounds: Normal heart sounds. No murmur heard. Pulmonary:     Breath sounds: Normal breath sounds. No wheezing or rales.  Abdominal:     Palpations: Abdomen is soft.     Tenderness: There is abdominal tenderness (Mild, suprapubic). There is no right CVA tenderness or left CVA tenderness.  Musculoskeletal:     Cervical back: Neck supple. No tenderness.     Right lower leg: No edema.     Left lower leg: No edema.  Skin:    General: Skin is warm.     Findings: No rash.  Neurological:     General: No focal deficit present.     Mental Status: He is alert and oriented to person, place, and time.     Sensory: No sensory deficit.     Motor: No weakness.  Psychiatric:        Mood and Affect: Mood normal.        Behavior: Behavior normal.    BP (!) 154/89 (BP Location: Left Arm, Patient Position: Sitting, Cuff Size: Normal)   Pulse 78   Temp 97.8 F (36.6 C) (Oral)   Resp 18   Ht 5' 9.5" (1.765 m)   Wt 169 lb 12.8 oz (77 kg)   SpO2 99%   BMI 24.72 kg/m  Wt Readings from Last 3 Encounters:  03/26/21 169 lb 12.8 oz (77 kg)  01/21/21 174 lb (78.9 kg)  01/21/21 174 lb 12.8 oz (79.3 kg)    Health Maintenance Due  Topic Date Due   COVID-19 Vaccine (1) Never done   Pneumococcal Vaccine 9-58 Years old (1 - PCV) Never done   INFLUENZA VACCINE  03/23/2021    There are no preventive care reminders to display for this patient.   Lab Results  Component Value Date   TSH 1.870 01/14/2021   Lab Results  Component Value Date   WBC 5.5 01/14/2021   HGB 14.0 01/14/2021   HCT 45.2 01/14/2021   MCV 77 (L)  01/14/2021   PLT 232 01/14/2021   Lab Results  Component Value Date   NA 137 01/14/2021   K 4.2 01/14/2021   CO2 21 01/14/2021   GLUCOSE 171 (H) 01/14/2021   BUN 20 01/14/2021   CREATININE 1.26 01/14/2021   BILITOT 0.5 01/14/2021   ALKPHOS 55 01/14/2021   AST 23 01/14/2021   ALT 17 01/14/2021   PROT 6.6 01/14/2021   ALBUMIN 4.2 01/14/2021   CALCIUM 9.4 01/14/2021   ANIONGAP 13 01/08/2018   EGFR 66 01/14/2021   Lab Results  Component Value Date   CHOL 229 (H) 01/14/2021   Lab Results  Component Value Date   HDL 34 (L) 01/14/2021   Lab Results  Component Value Date   LDLCALC 96 01/14/2021   Lab  Results  Component Value Date   TRIG 592 ([place]) 01/14/2021   Lab Results  Component Value Date   CHOLHDL 6.7 (H) 01/14/2021   Lab Results  Component Value Date   HGBA1C 9.2 (H) 01/14/2021       Assessment & Plan:   UTI Check UA and urine culture Started levofloxacin as he had already started from home Advised to increase fluid intake Referred to urology as he reports recurrent UTIs and for probable BPH  Benign prostatic hyperplasia with weak urinary stream Started Tamsulosin Referred to Urology for further evaluation as he reports recurrent UTI  Tinnitus Chronic Referred to ENT for further evaluation 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    Dispense:  30 capsule    Refill:  0   levofloxacin (LEVAQUIN) 500 MG tablet    Sig: Take 1 tablet (500 mg total) by mouth daily for 7 days.    Dispense:  7 tablet    Refill:  0     Jermar Colter Keith Rake, MD

## 2021-03-26 NOTE — Assessment & Plan Note (Signed)
Started Tamsulosin Referred to Urology for further evaluation as he reports recurrent UTI

## 2021-03-26 NOTE — Patient Instructions (Signed)
Please start taking tamsulosin for BPH.  Please start taking levofloxacin as prescribed.  Your being referred to urology for evaluation of BPH.  You being referred to ENT specialist for evaluation of tinnitus.

## 2021-03-27 LAB — URINALYSIS
Bilirubin, UA: NEGATIVE
Glucose, UA: NEGATIVE
Ketones, UA: NEGATIVE
Leukocytes,UA: NEGATIVE
Nitrite, UA: NEGATIVE
Protein,UA: NEGATIVE
RBC, UA: NEGATIVE
Specific Gravity, UA: 1.012 (ref 1.005–1.030)
Urobilinogen, Ur: 0.2 mg/dL (ref 0.2–1.0)
pH, UA: 6.5 (ref 5.0–7.5)

## 2021-03-29 LAB — URINE CULTURE: Organism ID, Bacteria: NO GROWTH

## 2021-03-30 ENCOUNTER — Other Ambulatory Visit: Payer: Self-pay | Admitting: "Endocrinology

## 2021-03-30 ENCOUNTER — Other Ambulatory Visit: Payer: Self-pay | Admitting: Internal Medicine

## 2021-03-30 DIAGNOSIS — E782 Mixed hyperlipidemia: Secondary | ICD-10-CM

## 2021-04-14 ENCOUNTER — Ambulatory Visit: Payer: PPO | Admitting: Internal Medicine

## 2021-04-15 ENCOUNTER — Other Ambulatory Visit: Payer: Self-pay | Admitting: Internal Medicine

## 2021-04-15 DIAGNOSIS — N401 Enlarged prostate with lower urinary tract symptoms: Secondary | ICD-10-CM

## 2021-04-15 DIAGNOSIS — R3912 Poor urinary stream: Secondary | ICD-10-CM

## 2021-04-23 ENCOUNTER — Ambulatory Visit: Payer: PPO | Admitting: "Endocrinology

## 2021-04-25 ENCOUNTER — Other Ambulatory Visit: Payer: Self-pay | Admitting: "Endocrinology

## 2021-05-01 ENCOUNTER — Ambulatory Visit: Payer: PPO | Admitting: Internal Medicine

## 2021-05-04 ENCOUNTER — Other Ambulatory Visit: Payer: Self-pay | Admitting: Internal Medicine

## 2021-05-04 DIAGNOSIS — Z789 Other specified health status: Secondary | ICD-10-CM

## 2021-05-04 DIAGNOSIS — E1142 Type 2 diabetes mellitus with diabetic polyneuropathy: Secondary | ICD-10-CM

## 2021-05-04 NOTE — Progress Notes (Signed)
[place] [facility])                                            [place] Team                                        Statin Quality Measure Assessment    05/04/2021  MALIIK SCHONS 02-15-1961 [id number]  Dr. [name],   I am a [facility] clinical pharmacist that reviews patients for statin quality initiatives.     Per review of chart and payor information, patient has a diagnosis of diabetes and cardiovascular disease but is not currently filling a statin prescription.  This places patient into the SUPD (Statin Use In Patients with Diabetes) and SPC (Statin Use in Patients with Cardiovascular Disease) measures for CMS.    Mr. Dworak has documented statin intolerances but no corresponding exclusion codes to remove the patient from the SUPD and [facility] quality measures .     Component Value Date/Time   CHOL 229 (H) 01/14/2021 1348   TRIG 592 (HH) 01/14/2021 1348   HDL 34 (L) 01/14/2021 1348   CHOLHDL 6.7 (H) 01/14/2021 1348   CHOLHDL 6.9 (H) 08/19/2016 1259   VLDL NOT CALC 08/19/2016 1259   LDLCALC 96 01/14/2021 1348   LDLDIRECT 110 08/19/2016 1259    Please consider ONE of the following recommendations:  Initiate high intensity statin Atorvastatin '40mg'$  once daily, #90, 3 refills   Rosuvastatin '20mg'$  once daily, #90, 3 refills    Initiate moderate intensity  statin with reduced frequency if prior  statin intolerance 1x weekly, #13, 3 refills   2x weekly, #26, 3 refills   3x weekly, #39, 3 refills    Code for past statin intolerance  (required annually)  Provider Requirements: Must associate code during an office visit or telehealth encounter   Drug Induced Myopathy G72.0   Myositis, unspecified M60.9   Myopathy, unspecified G72.9   Rhabdomyolysis  [id number]   Alcoholic cirrhosis of liver without ascites 0000000   Alcoholic cirrhosis of liver with ascites K70.31   Unspecified cirrhosis of liver K74.60   Toxic liver disease with  fibrosis and cirrhosis of liver K71.7      Please let us know your decision.    Thank you!   [name], PharmD Clinical Pharmacist  [place] [phone]

## 2021-05-05 DIAGNOSIS — H905 Unspecified sensorineural hearing loss: Secondary | ICD-10-CM | POA: Diagnosis not present

## 2021-05-06 ENCOUNTER — Encounter: Payer: Self-pay | Admitting: Internal Medicine

## 2021-05-06 ENCOUNTER — Ambulatory Visit (INDEPENDENT_AMBULATORY_CARE_PROVIDER_SITE_OTHER): Payer: PPO | Admitting: Internal Medicine

## 2021-05-06 ENCOUNTER — Encounter: Payer: Self-pay | Admitting: "Endocrinology

## 2021-05-06 ENCOUNTER — Ambulatory Visit: Payer: PPO | Admitting: "Endocrinology

## 2021-05-06 ENCOUNTER — Other Ambulatory Visit: Payer: Self-pay

## 2021-05-06 ENCOUNTER — Other Ambulatory Visit: Payer: Self-pay | Admitting: *Deleted

## 2021-05-06 VITALS — BP 148/99 | HR 60 | Resp 18 | Ht 69.5 in | Wt 170.0 lb

## 2021-05-06 VITALS — BP 136/78 | HR 64 | Ht 69.5 in | Wt 170.8 lb

## 2021-05-06 DIAGNOSIS — E1142 Type 2 diabetes mellitus with diabetic polyneuropathy: Secondary | ICD-10-CM

## 2021-05-06 DIAGNOSIS — E0865 Diabetes mellitus due to underlying condition with hyperglycemia: Secondary | ICD-10-CM | POA: Diagnosis not present

## 2021-05-06 DIAGNOSIS — K8689 Other specified diseases of pancreas: Secondary | ICD-10-CM | POA: Diagnosis not present

## 2021-05-06 DIAGNOSIS — K8681 Exocrine pancreatic insufficiency: Secondary | ICD-10-CM

## 2021-05-06 DIAGNOSIS — E782 Mixed hyperlipidemia: Secondary | ICD-10-CM

## 2021-05-06 DIAGNOSIS — F339 Major depressive disorder, recurrent, unspecified: Secondary | ICD-10-CM

## 2021-05-06 DIAGNOSIS — IMO0002 Reserved for concepts with insufficient information to code with codable children: Secondary | ICD-10-CM

## 2021-05-06 DIAGNOSIS — I10 Essential (primary) hypertension: Secondary | ICD-10-CM

## 2021-05-06 DIAGNOSIS — N401 Enlarged prostate with lower urinary tract symptoms: Secondary | ICD-10-CM

## 2021-05-06 DIAGNOSIS — R3912 Poor urinary stream: Secondary | ICD-10-CM

## 2021-05-06 DIAGNOSIS — Z789 Other specified health status: Secondary | ICD-10-CM | POA: Diagnosis not present

## 2021-05-06 LAB — POCT GLYCOSYLATED HEMOGLOBIN (HGB A1C): HbA1c, POC (controlled diabetic range): 8.1 % — AB (ref 0.0–7.0)

## 2021-05-06 MED ORDER — METOPROLOL TARTRATE 25 MG PO TABS: 25.0000 mg | ORAL_TABLET | Freq: Two times a day (BID) | ORAL | 5 refills | Status: DC

## 2021-05-06 MED ORDER — PAROXETINE HCL 10 MG PO TABS: ORAL_TABLET | ORAL | 0 refills | Status: DC

## 2021-05-06 NOTE — Progress Notes (Signed)
05/06/2021, 6:00 PM   Endocrinology follow-up note  Subjective:    Patient ID: [name], male    DOB: Dec 29, 1960.  [name] is being seen in  follow-up after he was seen in consultation for management of currently uncontrolled symptomatic diabetes, hyperlipidemia, exocrine pancreatic insufficiency. PMD:  Lindell Spar, MD.   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  Patent coronary bypass grafts with a free radial graft to PDA and LIMA to the  diag.   Diabetes mellitus    Diabetic peripheral neuropathy ([place]) 05/06/2020   Dyslipidemia    Poorly controlled, probably related to his DM (Some of this is secondary to his inability to afford medications).   ED (erectile dysfunction)    Esophageal yeast infection ([place])    Metaplasia of esophagus    Pancreatitis chronic    Pseudocyst   Polycythemia vera(238.4) 04/26/2012    Past Surgical History:  Procedure Laterality Date   APPENDECTOMY     CARDIAC CATHETERIZATION     CHOLECYSTECTOMY     COLONOSCOPY  07/31/1985   COLONOSCOPY N/A 03/11/2016   Procedure: COLONOSCOPY;  Surgeon: Rogene Houston, MD;  Location: [facility];  Service: Endoscopy;  Laterality: N/A;   CORONARY ANGIOPLASTY WITH STENT PLACEMENT  2006   CORONARY ARTERY BYPASS GRAFT  11/24/2004   ESOPHAGOGASTRODUODENOSCOPY  09/19/01   ESOPHAGOGASTRODUODENOSCOPY N/A 03/11/2016   Procedure: ESOPHAGOGASTRODUODENOSCOPY (EGD);  Surgeon: Rogene Houston, MD;  Location: [facility];  Service: Endoscopy;  Laterality: N/A;  12:00   FINGER TENDON REPAIR     in middle finger   KNEE SURGERY     left knee   NASAL SEPTUM SURGERY      Social History   Socioeconomic History   Marital status: Married    Spouse name: Not on file   Number of children: Not on file   Years of  education: Not on file   Highest education level: Not on file  Occupational History   Not on file  Tobacco Use   Smoking status: Never   Smokeless tobacco: Never  Substance and Sexual Activity   Alcohol use: Yes    Comment: Patient may drink a beer or glass of wine a couple times a month   Drug use: No   Sexual activity: Not on file  Other Topics Concern   Not on file  Social History Narrative   The patient lives in [place] with his wife.  He is     disabled secondary to coronary artery disease and pancreatitis.  He does     not smoke, he does not drink alcohol.  There is no drug use.    Social Determinants of Health   Financial Resource Strain: Low Risk    Difficulty of Paying Living Expenses: Not hard at all  Food Insecurity: No Food Insecurity   Worried About Charity fundraiser in the Last Year: Never true   [place] in the Last Year: Never true  Transportation Needs: No Transportation Needs   Lack of Transportation (Medical): No   Lack of Transportation (Non-Medical): No  Physical Activity: Insufficiently Active   Days of Exercise per Week: 3 days   Minutes of Exercise per  Session: 20 min  Stress: No Stress Concern Present   Feeling of Stress : Only a little  Social Connections: Moderately Isolated   Frequency of Communication with Friends and Family: More than three times a week   Frequency of Social Gatherings with Friends and Family: Once a week   Attends Religious Services: Never   Marine scientist or Organizations: No   Attends Music therapist: Never   Marital Status: Married    Family History  Problem Relation Age of Onset   Coronary artery disease Mother        strong family history of   Heart attack Mother        dying of (at age 30)   Diabetes Mother    Diabetes Sister    Diabetes Brother    Pancreatitis Brother    Healthy Daughter    Hyperlipidemia Daughter    Heart disease Brother    Hypertension Brother     Hyperlipidemia Brother     Outpatient Encounter Medications as of 05/06/2021  Medication Sig   Cholecalciferol 25 MCG (1000 UT) tablet Take 4,000 Units by mouth daily.   clopidogrel (PLAVIX) 75 MG tablet TAKE ONE TABLET BY MOUTH ONCE DAILY AS A BLOOD THINNER.   Continuous Blood Gluc Sensor (FREESTYLE LIBRE 2 SENSOR) MISC USE TO CHECK BLOOD SUGAR AS DIRECTED.   diazepam (VALIUM) 10 MG tablet Take 10 mg by mouth at bedtime as needed for anxiety or sleep. As Needed   ezetimibe (ZETIA) 10 MG tablet TAKE ONE TABLET BY MOUTH ONCE DAILY.   gabapentin (NEURONTIN) 400 MG capsule TAKE (1) CAPSULE BY MOUTH TWICE DAILY.   glucose blood (FREESTYLE TEST STRIPS) test strip Use as instructed   hydrochlorothiazide (HYDRODIURIL) 25 MG tablet TAKE (1) TABLET BY MOUTH TWICE DAILY FOR HIGH BLOOD PRESSURE. (Patient taking differently: Take 25 mg by mouth daily.)   HYDROcodone-acetaminophen (NORCO) 10-325 MG tablet TAKE (1) TABLET BY MOUTH FOUR TIMES DAILY FOR SEVERE PAIN.   insulin NPH-regular Human (NOVOLIN 70/30) (70-30) 100 UNIT/ML injection Inject 48 Units into the skin 2 (two) times daily before a meal. When glucose is above 90 and eating   lansoprazole (PREVACID) 30 MG capsule Take 1 capsule (30 mg total) by mouth 2 (two) times daily before a meal. (Patient taking differently: Take 30 mg by mouth 2 (two) times daily before a meal. Pt states he takes 4 pills a week)   levofloxacin (LEVAQUIN) 500 MG tablet TAKE ONE TABLET BY MOUTH ONCE DAILY FOR7 DAYS.   losartan (COZAAR) 25 MG tablet Take 1 tablet (25 mg total) by mouth daily.   metFORMIN (GLUCOPHAGE-XR) 500 MG 24 hr tablet TAKE 1 TABLET BY MOUTH ONCE DAILY WITH BREAKFAST.   methocarbamol (ROBAXIN) 500 MG tablet Take 1 tablet (500 mg total) by mouth every 8 (eight) hours as needed for muscle spasms.   metoprolol tartrate (LOPRESSOR) 25 MG tablet Take 1 tablet (25 mg total) by mouth 2 (two) times daily.   nitroGLYCERIN (NITROSTAT) 0.4 MG SL tablet Place 0.4 mg  under the tongue every 5 (five) minutes as needed for chest pain. Reported on 02/26/2016   NON FORMULARY Domperidone 10 mg Patient takes 2 by mouth daily   omega-3 acid ethyl esters (LOVAZA) 1 g capsule Take 2 capsules by mouth twice daily   ondansetron (ZOFRAN) 4 MG tablet Take 4 mg by mouth every 8 (eight) hours as needed for nausea or vomiting.   Pancrelipase, Lip-Prot-Amyl, 6000 units CPEP Take by mouth as  needed.    PARoxetine (PAXIL) 10 MG tablet TAKE ONE TABLET BY MOUTH ONCE DAILY FOR DEPRESSION.   promethazine (PHENERGAN) 25 MG tablet Take 25 mg by mouth every 8 (eight) hours as needed for nausea or vomiting.   sildenafil (VIAGRA) 50 MG tablet Take 1 tablet (50 mg total) by mouth daily as needed for erectile dysfunction.   tamsulosin (FLOMAX) 0.4 MG CAPS capsule Take 1 capsule (0.4 mg total) by mouth daily.   No facility-administered encounter medications on file as of 05/06/2021.    ALLERGIES: Allergies  Allergen Reactions   Iodinated Diagnostic Agents Hives, Itching and Other (See Comments)    Rash, nausea and flushing Other reaction(s): Other Burning, itching Burning, itching Other reaction(s): Angioedema (ALLERGY/intolerance)    Metoclopramide Hcl Hives    Patient became very spastic   Propofol Other (See Comments)    Other reaction(s): Other (See Comments) Pancreatic attack. Other reaction(s): Other (See Comments) Pancreatic attack. Pancreatic attack.    Iohexol      Desc: HIVES,SOB NEEDS PREP.MEDS    Propofol Other (See Comments)    Pancreatic attack.   Sulfonamide Derivatives     Large hives   Enalapril Cough and Other (See Comments)   Penicillins Rash    Has patient had a PCN reaction causing immediate rash, facial/tongue/throat swelling, SOB or lightheadedness with hypotension: Yes Has patient had a PCN reaction causing severe rash involving mucus membranes or skin necrosis: No Has patient had a PCN reaction that required hospitalization No Has patient had a  PCN reaction occurring within the last 10 years: No. If all of the above answers are "NO", then may proceed with Cephalosporin use.    Sulfa Antibiotics Rash    Other Reaction: Not Assessed    VACCINATION STATUS:  There is no immunization history on file for this patient.  Diabetes He presents for his follow-up diabetic visit. Diabetes type: Pancreatic diabetes. His disease course has been improving. There are no hypoglycemic associated symptoms. Pertinent negatives for hypoglycemia include no confusion, headaches, pallor or seizures. Pertinent negatives for diabetes include no chest pain, no fatigue, no polydipsia, no polyphagia, no polyuria and no weakness. There are no hypoglycemic complications. Symptoms are improving. Diabetic complications include heart disease, impotence and peripheral neuropathy. Risk factors for coronary artery disease include dyslipidemia, diabetes mellitus, family history, male sex, hypertension and sedentary lifestyle. Current diabetic treatments: Is currently on Novolin 70/30 49 units nightly, glipizide 10 mg p.o. daily, Metformin 1000 mg p.o. twice daily. His weight is fluctuating minimally. He is following a generally unhealthy diet. When asked about meal planning, he reported none. He has not had a previous visit with a dietitian. He participates in exercise intermittently. His home blood glucose trend is decreasing steadily. His breakfast blood glucose range is generally 140-180 mg/dl. His lunch blood glucose range is generally 140-180 mg/dl. His dinner blood glucose range is generally 140-180 mg/dl. His bedtime blood glucose range is generally 140-180 mg/dl. His overall blood glucose range is 140-180 mg/dl. [name] presents with improved glycemic profile, 46% time range in his CGM device, 52% above range.    His point-of-care A1c is 8.1% improving from 9.2%.  Average blood glucose for the last 14 days is 183.  No major hypoglycemia documented or reported.   ) An ACE  inhibitor/angiotensin II receptor blocker is not being taken. Eye exam is current.  Hyperlipidemia This is a chronic problem. The current episode started more than 1 year ago. Recent lipid tests were reviewed and are high.  Pertinent negatives include no chest pain, myalgias or shortness of breath. Current antihyperlipidemic treatment includes bile acid sequestrants (He has statin intolerance.). Risk factors for coronary artery disease include dyslipidemia, diabetes mellitus, family history, hypertension, male sex and a sedentary lifestyle.  Hypertension This is a chronic problem. The current episode started more than 1 year ago. The problem is controlled. Pertinent negatives include no chest pain, headaches, neck pain, palpitations or shortness of breath. Risk factors for coronary artery disease include dyslipidemia, diabetes mellitus, family history, male gender and sedentary lifestyle. Past treatments include diuretics and beta blockers. Hypertensive end-organ damage includes CAD/MI.    Review of Systems  Constitutional:  Negative for chills, fatigue, fever and unexpected weight change.  HENT:  Negative for dental problem, mouth sores and trouble swallowing.   Eyes:  Negative for visual disturbance.  Respiratory:  Negative for cough, choking, chest tightness, shortness of breath and wheezing.   Cardiovascular:  Negative for chest pain, palpitations and leg swelling.  Gastrointestinal:  Negative for abdominal distention, abdominal pain, constipation, diarrhea, nausea and vomiting.  Endocrine: Negative for polydipsia, polyphagia and polyuria.  Genitourinary:  Positive for impotence. Negative for dysuria, flank pain, hematuria and urgency.  Musculoskeletal:  Negative for back pain, gait problem, myalgias and neck pain.  Skin:  Negative for pallor, rash and wound.  Neurological:  Negative for seizures, syncope, weakness, numbness and headaches.  Psychiatric/Behavioral:  Negative for confusion and  dysphoric mood.    Objective:    Vitals with BMI 05/06/2021 05/06/2021 03/26/2021  Height 5' 9.5" 5' 9.5" 5' 9.5"  Weight 170 lbs 13 oz 170 lbs 1 oz 169 lbs 13 oz  BMI 24.87 [id number]  Systolic [id number]  Diastolic 78 99 89  Pulse 64 60 78    BP 136/78   Pulse 64   Ht 5' 9.5" (1.765 m)   Wt 170 lb 12.8 oz (77.5 kg)   BMI 24.86 kg/m   Wt Readings from Last 3 Encounters:  05/06/21 170 lb 12.8 oz (77.5 kg)  05/06/21 170 lb 0.6 oz (77.1 kg)  03/26/21 169 lb 12.8 oz (77 kg)     Physical Exam Constitutional:      General: He is not in acute distress.    Appearance: He is well-developed.  HENT:     Head: Normocephalic and atraumatic.  Neck:     Thyroid: No thyromegaly.     Trachea: No tracheal deviation.  Cardiovascular:     Rate and Rhythm: Normal rate.     Pulses:          Dorsalis pedis pulses are 1+ on the right side and 1+ on the left side.       Posterior tibial pulses are 1+ on the right side and 1+ on the left side.     Heart sounds: Normal heart sounds, S1 normal and S2 normal. No murmur heard.   No gallop.  Pulmonary:     Effort: No respiratory distress.     Breath sounds: Normal breath sounds. No wheezing.  Abdominal:     General: Bowel sounds are normal. There is no distension.     Palpations: Abdomen is soft.     Tenderness: There is no abdominal tenderness. There is no guarding.  Musculoskeletal:     Right shoulder: No swelling or deformity.     Cervical back: Normal range of motion and neck supple.  Skin:    General: Skin is warm and dry.     Findings: No rash.  Nails: There is no clubbing.  Neurological:     Mental Status: He is alert and oriented to person, place, and time.     Cranial Nerves: No cranial nerve deficit.     Sensory: No sensory deficit.     Gait: Gait normal.     Deep Tendon Reflexes: Reflexes are normal and symmetric.  Psychiatric:        Speech: Speech normal.        Behavior: Behavior normal. Behavior is cooperative.         Thought Content: Thought content normal.        Judgment: Judgment normal.   CMP ( most recent) CMP     Component Value Date/Time   NA 137 01/14/2021 1348   K 4.2 01/14/2021 1348   CL 98 01/14/2021 1348   CO2 21 01/14/2021 1348   GLUCOSE 171 (H) 01/14/2021 1348   GLUCOSE 303 (H) 08/27/2018 0403   BUN 20 01/14/2021 1348   CREATININE 1.26 01/14/2021 1348   CREATININE 1.27 07/08/2017 1343   CALCIUM 9.4 01/14/2021 1348   PROT 6.6 01/14/2021 1348   ALBUMIN 4.2 01/14/2021 1348   AST 23 01/14/2021 1348   ALT 17 01/14/2021 1348   ALKPHOS 55 01/14/2021 1348   BILITOT 0.5 01/14/2021 1348   GFRNONAA 54 (L) 06/27/2020 1500   GFRAA 62 06/27/2020 1500     Diabetic Labs (most recent): Lab Results  Component Value Date   HGBA1C 8.1 (A) 05/06/2021   HGBA1C 9.2 (H) 01/14/2021   HGBA1C 8.2 09/23/2020     Lipid Panel ( most recent) Lipid Panel     Component Value Date/Time   CHOL 229 (H) 01/14/2021 1348   TRIG 592 (HH) 01/14/2021 1348   HDL 34 (L) 01/14/2021 1348   CHOLHDL 6.7 (H) 01/14/2021 1348   CHOLHDL 6.9 (H) 08/19/2016 1259   VLDL NOT CALC 08/19/2016 1259   LDLCALC 96 01/14/2021 1348   LDLDIRECT 110 08/19/2016 1259   LABVLDL 99 (H) 01/14/2021 1348       Assessment & Plan:   1.  Uncontrolled diabetes due to pancreatic insufficiency , CKD.   - [name] has currently uncontrolled symptomatic diabetes as a result of pancreatic insufficiency 60 years of age,  with most recent A1c of 7.9 %. Recent labs reviewed.  His medical history is long and complicated.  [name] presents with improved glycemic profile, 46% time range in his CGM device, 52% above range.    His point-of-care A1c is 8.1% improving from 9.2%.  Average blood glucose for the last 14 days is 183.  No major hypoglycemia documented or reported.    - I had a long discussion with him about the pathology behind his diabetes and its complications. -his diabetes is complicated by coronary artery disease which  required intervention, ED, peripheral neuropathy and he remains at a high risk for more acute and chronic complications which include CAD, CVA, CKD, retinopathy, and neuropathy. These are all discussed in detail with him.  - I have counseled him on diet  and weight management  by adopting a carbohydrate restricted/protein rich diet. Patient is encouraged to switch to  unprocessed or minimally processed     complex starch and increased protein intake (animal or plant source), fruits, and vegetables. -  he is advised to stick to a routine mealtimes to eat 3 meals  a day and avoid unnecessary snacks ( to snack only to correct hypoglycemia).   - he acknowledges that there is a  room for improvement in his food and drink choices. - Suggestion is made for him to avoid simple carbohydrates  from his diet including Cakes, Sweet Desserts, Ice Cream, Soda (diet and regular), Sweet Tea, Candies, Chips, Cookies, Store Bought Juices, Alcohol in Excess of  1-2 drinks a day, Artificial Sweeteners,  Coffee Creamer, and "Sugar-free" Products, Lemonade. This will help patient to have more stable blood glucose profile and potentially avoid unintended weight gain.   - he will be scheduled with Jearld Fenton, RDN, CDE for diabetes education.  - I have approached him with the following individualized plan to manage  his diabetes and patient agrees:   -In light of his diabetes being due to his pancreatic insufficiency, he will continue to need multiple daily injections of insulin  in order for him to achieve and maintain control of diabetes to target.    -He has done reasonably well with premixed insulin twice a day.   -He has been adjusting his dose, encouraged to stay on his recommended dose of Novolin 70/30 at 50 units twice a day-   with breakfast and supper  for premeal blood glucose readings above 90 mg per DL, associated with documenting glucose profile at least 4 times a day before meals and at bedtime using his  CGM.  - he is warned not to take insulin without proper monitoring per orders.  - he is encouraged to call clinic for blood glucose levels less than 70 or above 200 mg /dl. -He has tolerated and benefited from low-dose metformin.  He is advised to continue Metformin 500 mg XR p.o. daily after breakfast.  -He will be considered for insulin analogs utilizing Lantus and NovoLog if necessary on next visit. - he is not a suitable candidate for incretin therapy.    - Specific targets for  A1c;  LDL, HDL,  and Triglycerides were discussed with the patient.  2) Blood Pressure /Hypertension:  His blood pressure is controlled to target.  He will not tolerate any additional medications with ACE inhibitors or ARB. he is advised to continue his current medications including hydrochlorothiazide, and metoprolol. His urine microalbumin to creatinine ratio is abnormal.  He will have a repeat urine microalbumin next visit.  3) Lipids/Hyperlipidemia:   Review of his recent lipid panel showed uncontrolled triglyceride levels to 592, although improving from 900+.  His LDL has improved to 96 from 156.    He does not tolerate statins.  He is on Lovaza 2 g p.o. twice daily, advised to continue.  He admits to dietary indiscretion.  He is also advised to avoid butter and fried food.  He will be considered for fasting lipid panel before his next visit.    4)  Weight/Diet:  Body mass index is 24.86 kg/m.  -     he is not a candidate for weight loss. I discussed with him the fact that loss of 5 - 10% of his  current body weight will have the most impact on his diabetes management.  Exercise, and detailed carbohydrates information provided  -  detailed on discharge instructions.  5) Chronic Care/Health Maintenance:  -he  Is not  on ACEI/ARB and Statin medications and  is encouraged to initiate and continue to follow up with Ophthalmology, Dentist,  Podiatrist at least yearly or according to recommendations, and advised  to   stay away from smoking. I have recommended yearly flu vaccine and pneumonia vaccine at least every 5 years; moderate intensity exercise for up to 150  minutes weekly; and  sleep for at least 7 hours a day.  6) pancreatic insufficiency -He is advised to continue his enzymatic replacements currently pancrelipase 6000 units with meals and snacks.     - he is  advised to maintain close follow up with Lindell Spar, MD for primary care needs, as well as his other providers for optimal and coordinated care.     I spent 40 minutes in the care of the patient today including review of labs from [place], Lipids, Thyroid Function, Hematology (current and previous including abstractions from other facilities); face-to-face time discussing  his blood glucose readings/logs, discussing hypoglycemia and hyperglycemia episodes and symptoms, medications doses, his options of short and long term treatment based on the latest standards of care / guidelines;  discussion about incorporating lifestyle medicine;  and documenting the encounter.    Please refer to Patient Instructions for Blood Glucose Monitoring and Insulin/Medications Dosing Guide"  in media tab for additional information. Please  also refer to " Patient Self Inventory" in the Media  tab for reviewed elements of pertinent patient history.  [name] participated in the discussions, expressed understanding, and voiced agreement with the above plans.  All questions were answered to his satisfaction. he is encouraged to contact clinic should he have any questions or concerns prior to his return visit.    Follow up plan: - Return in about 6 months (around 11/03/2021) for F/U with Pre-visit Labs, Meter, Logs, A1c here.Glade Lloyd, MD [facility] [address] [place], [place] [address] Phone: [phone]  Fax: [phone]    05/06/2021, 6:00 PM  This note was partially dictated with  voice recognition software. Similar sounding words can be transcribed inadequately or may not  be corrected upon review.

## 2021-05-06 NOTE — Patient Instructions (Signed)
Please start taking full tablet of Losartan. Continue taking Metoprolol and HCTZ as prescribed.  Please start taking Tamsulosin regularly.

## 2021-05-06 NOTE — Progress Notes (Signed)
Acute Office Visit  Subjective:    Patient ID: [name], male    DOB: [birth year], 60 y.o.   MRN: [id number]  Chief Complaint  Patient presents with   Follow-up    Still having trouble with prostate is supposed to see dr in Oct neuropathy pain is causing him some trouble     HPI Patient is in today for evaluation of BP and c/o weak urinary stream.  HTN: His BP was elevated in the office today. He has been taking only 1/2 tablet of Losartan. He still takes Metoprolol and HCTZ. He denies any chest pain, dyspnea or palpitations.  He has not been taking Tamsulosin regularly. He still c/o weak urinary stream and nocturia. He has appointment with Urologist in the next month.  He c/o neuropathic pain, for which he takes Gabapentin and PRN Norco at times.  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  Patent coronary bypass grafts with a free radial graft to PDA and LIMA to the  diag.   Diabetes mellitus    Diabetic peripheral neuropathy ([place]) 05/06/2020   Dyslipidemia    Poorly controlled, probably related to his DM (Some of this is secondary to his inability to afford medications).   ED (erectile dysfunction)    Esophageal yeast infection ([place])    Metaplasia of esophagus    Pancreatitis chronic    Pseudocyst   Polycythemia vera(238.4) 04/26/2012    Past Surgical History:  Procedure Laterality Date   APPENDECTOMY     CARDIAC CATHETERIZATION     CHOLECYSTECTOMY     COLONOSCOPY  07/31/1985   COLONOSCOPY N/A 03/11/2016   Procedure: COLONOSCOPY;  Surgeon: Rogene Houston, MD;  Location: [facility];  Service: Endoscopy;  Laterality: N/A;   CORONARY ANGIOPLASTY WITH STENT PLACEMENT  2006   CORONARY ARTERY BYPASS GRAFT  11/24/2004   ESOPHAGOGASTRODUODENOSCOPY  09/19/01   ESOPHAGOGASTRODUODENOSCOPY N/A 03/11/2016    Procedure: ESOPHAGOGASTRODUODENOSCOPY (EGD);  Surgeon: Rogene Houston, MD;  Location: [facility];  Service: Endoscopy;  Laterality: N/A;  12:00   FINGER TENDON REPAIR     in middle finger   KNEE SURGERY     left knee   NASAL SEPTUM SURGERY      Family History  Problem Relation Age of Onset   Coronary artery disease Mother        strong family history of   Heart attack Mother        dying of (at age 99)   Diabetes Mother    Diabetes Sister    Diabetes Brother    Pancreatitis Brother    Healthy Daughter    Hyperlipidemia Daughter    Heart disease Brother    Hypertension Brother    Hyperlipidemia Brother     Social History   Socioeconomic History   Marital status: Married    Spouse name: Not on file   Number of children: Not on file   Years of education: Not on file   Highest education level: Not on file  Occupational History   Not on file  Tobacco Use   Smoking status: Never   Smokeless tobacco: Never  Substance and Sexual Activity   Alcohol use: Yes    Comment: Patient may drink a beer or glass  of wine a couple times a month   Drug use: No   Sexual activity: Not on file  Other Topics Concern   Not on file  Social History Narrative   The patient lives in [place] with his wife.  He is     disabled secondary to coronary artery disease and pancreatitis.  He does     not smoke, he does not drink alcohol.  There is no drug use.    Social Determinants of Health   Financial Resource Strain: Low Risk    Difficulty of Paying Living Expenses: Not hard at all  Food Insecurity: No Food Insecurity   Worried About Charity fundraiser in the Last Year: Never true   [place] in the Last Year: Never true  Transportation Needs: No Transportation Needs   Lack of Transportation (Medical): No   Lack of Transportation (Non-Medical): No  Physical Activity: Insufficiently Active   Days of Exercise per Week: 3 days   Minutes of Exercise per Session: 20 min   Stress: No Stress Concern Present   Feeling of Stress : Only a little  Social Connections: Moderately Isolated   Frequency of Communication with Friends and Family: More than three times a week   Frequency of Social Gatherings with Friends and Family: Once a week   Attends Religious Services: Never   Marine scientist or Organizations: No   Attends Archivist Meetings: Never   Marital Status: Married  Human resources officer Violence: Not on file    Outpatient Medications Prior to Visit  Medication Sig Dispense Refill   Cholecalciferol 25 MCG (1000 UT) tablet Take 4,000 Units by mouth daily.     clopidogrel (PLAVIX) 75 MG tablet TAKE ONE TABLET BY MOUTH ONCE DAILY AS A BLOOD THINNER. 90 tablet 0   Continuous Blood Gluc Sensor (FREESTYLE LIBRE 2 SENSOR) MISC USE TO CHECK BLOOD SUGAR AS DIRECTED. 1 each 1   diazepam (VALIUM) 10 MG tablet Take 10 mg by mouth at bedtime as needed for anxiety or sleep. As Needed     ezetimibe (ZETIA) 10 MG tablet TAKE ONE TABLET BY MOUTH ONCE DAILY. 90 tablet 0   gabapentin (NEURONTIN) 400 MG capsule TAKE (1) CAPSULE BY MOUTH TWICE DAILY. 180 capsule 0   glucose blood (FREESTYLE TEST STRIPS) test strip Use as instructed 100 each 12   hydrochlorothiazide (HYDRODIURIL) 25 MG tablet TAKE (1) TABLET BY MOUTH TWICE DAILY FOR HIGH BLOOD PRESSURE. (Patient taking differently: Take 25 mg by mouth daily.) 180 tablet 0   HYDROcodone-acetaminophen (NORCO) 10-325 MG tablet TAKE (1) TABLET BY MOUTH FOUR TIMES DAILY FOR SEVERE PAIN. 120 tablet 0   insulin NPH-regular Human (NOVOLIN 70/30) (70-30) 100 UNIT/ML injection Inject 50 Units into the skin 2 (two) times daily before a meal. When glucose is above 90 and eating     lansoprazole (PREVACID) 30 MG capsule Take 1 capsule (30 mg total) by mouth 2 (two) times daily before a meal. (Patient taking differently: Take 30 mg by mouth 2 (two) times daily before a meal. Pt states he takes 4 pills a week) 60 capsule 3    levofloxacin (LEVAQUIN) 500 MG tablet TAKE ONE TABLET BY MOUTH ONCE DAILY FOR7 DAYS. 7 tablet 0   losartan (COZAAR) 25 MG tablet Take 1 tablet (25 mg total) by mouth daily. 30 tablet 3   metFORMIN (GLUCOPHAGE-XR) 500 MG 24 hr tablet TAKE 1 TABLET BY MOUTH ONCE DAILY WITH BREAKFAST. 90 tablet 0  methocarbamol (ROBAXIN) 500 MG tablet Take 1 tablet (500 mg total) by mouth every 8 (eight) hours as needed for muscle spasms. 30 tablet 2   metoprolol tartrate (LOPRESSOR) 100 MG tablet Take 25 mg by mouth 2 (two) times daily.      nitroGLYCERIN (NITROSTAT) 0.4 MG SL tablet Place 0.4 mg under the tongue every 5 (five) minutes as needed for chest pain. Reported on 02/26/2016     NON FORMULARY Domperidone 10 mg Patient takes 2 by mouth daily     omega-3 acid ethyl esters (LOVAZA) 1 g capsule Take 2 capsules by mouth twice daily 360 capsule 0   ondansetron (ZOFRAN) 4 MG tablet Take 4 mg by mouth every 8 (eight) hours as needed for nausea or vomiting.     Pancrelipase, Lip-Prot-Amyl, 6000 units CPEP Take by mouth as needed.      promethazine (PHENERGAN) 25 MG tablet Take 25 mg by mouth every 8 (eight) hours as needed for nausea or vomiting.     sildenafil (VIAGRA) 50 MG tablet Take 1 tablet (50 mg total) by mouth daily as needed for erectile dysfunction. 10 tablet 3   tamsulosin (FLOMAX) 0.4 MG CAPS capsule Take 1 capsule (0.4 mg total) by mouth daily. 30 capsule 0   PARoxetine (PAXIL) 10 MG tablet TAKE ONE TABLET BY MOUTH ONCE DAILY FOR DEPRESSION. 90 tablet 0   No facility-administered medications prior to visit.    Allergies  Allergen Reactions   Iodinated Diagnostic Agents Hives, Itching and Other (See Comments)    Rash, nausea and flushing Other reaction(s): Other Burning, itching Burning, itching Other reaction(s): Angioedema (ALLERGY/intolerance)    Metoclopramide Hcl Hives    Patient became very spastic   Propofol Other (See Comments)    Other reaction(s): Other (See Comments) Pancreatic  attack. Other reaction(s): Other (See Comments) Pancreatic attack. Pancreatic attack.    Iohexol      Desc: HIVES,SOB NEEDS PREP.MEDS    Propofol Other (See Comments)    Pancreatic attack.   Sulfonamide Derivatives     Large hives   Enalapril Cough and Other (See Comments)   Penicillins Rash    Has patient had a PCN reaction causing immediate rash, facial/tongue/throat swelling, SOB or lightheadedness with hypotension: Yes Has patient had a PCN reaction causing severe rash involving mucus membranes or skin necrosis: No Has patient had a PCN reaction that required hospitalization No Has patient had a PCN reaction occurring within the last 10 years: No. If all of the above answers are "NO", then may proceed with Cephalosporin use.    Sulfa Antibiotics Rash    Other Reaction: Not Assessed    Review of Systems  Constitutional:  Negative for chills and fever.  HENT:  Negative for congestion and sore throat.   Eyes:  Negative for pain and discharge.  Respiratory:  Negative for cough and shortness of breath.   Cardiovascular:  Negative for palpitations.  Gastrointestinal:  Negative for constipation, diarrhea, nausea and vomiting.  Endocrine: Negative for polydipsia and polyuria.  Genitourinary:  Positive for difficulty urinating. Negative for hematuria.  Musculoskeletal:  Positive for arthralgias, back pain and neck pain. Negative for neck stiffness.  Skin:  Negative for rash.  Neurological:  Positive for numbness (B/l LE, intermittent). Negative for dizziness, weakness and headaches.  Psychiatric/Behavioral:  Negative for agitation and behavioral problems.       Objective:    Physical Exam Vitals reviewed.  Constitutional:      General: He is not in acute distress.  Appearance: He is not diaphoretic.  HENT:     Head: Normocephalic and atraumatic.     Nose: Nose normal.     Mouth/Throat:     Mouth: Mucous membranes are moist.  Eyes:     General: No scleral icterus.     Extraocular Movements: Extraocular movements intact.  Cardiovascular:     Rate and Rhythm: Normal rate and regular rhythm.     Pulses: Normal pulses.     Heart sounds: Normal heart sounds. No murmur heard. Pulmonary:     Breath sounds: Normal breath sounds. No wheezing or rales.  Abdominal:     Palpations: Abdomen is soft.     Tenderness: There is no abdominal tenderness. There is no right CVA tenderness or left CVA tenderness.  Musculoskeletal:     Cervical back: Neck supple. No tenderness.     Right lower leg: No edema.     Left lower leg: No edema.  Skin:    General: Skin is warm.     Findings: No rash.  Neurological:     General: No focal deficit present.     Mental Status: He is alert and oriented to person, place, and time.     Sensory: No sensory deficit.     Motor: No weakness.  Psychiatric:        Mood and Affect: Mood normal.        Behavior: Behavior normal.    BP (!) 148/99 (BP Location: Left Arm, Patient Position: Sitting, Cuff Size: Normal)   Pulse 60   Resp 18   Ht 5' 9.5" (1.765 m)   Wt 170 lb 0.6 oz (77.1 kg)   SpO2 96%   BMI 24.75 kg/m  Wt Readings from Last 3 Encounters:  05/06/21 170 lb 0.6 oz (77.1 kg)  03/26/21 169 lb 12.8 oz (77 kg)  01/21/21 174 lb (78.9 kg)    Health Maintenance Due  Topic Date Due   COVID-19 Vaccine (1) Never done   Pneumococcal Vaccine 41-39 Years old (1 - PCV) Never done   Zoster Vaccines- Shingrix (1 of 2) Never done   INFLUENZA VACCINE  03/23/2021    There are no preventive care reminders to display for this patient.   Lab Results  Component Value Date   TSH 1.870 01/14/2021   Lab Results  Component Value Date   WBC 5.5 01/14/2021   HGB 14.0 01/14/2021   HCT 45.2 01/14/2021   MCV 77 (L) 01/14/2021   PLT 232 01/14/2021   Lab Results  Component Value Date   NA 137 01/14/2021   K 4.2 01/14/2021   CO2 21 01/14/2021   GLUCOSE 171 (H) 01/14/2021   BUN 20 01/14/2021   CREATININE 1.26 01/14/2021   BILITOT  0.5 01/14/2021   ALKPHOS 55 01/14/2021   AST 23 01/14/2021   ALT 17 01/14/2021   PROT 6.6 01/14/2021   ALBUMIN 4.2 01/14/2021   CALCIUM 9.4 01/14/2021   ANIONGAP 13 01/08/2018   EGFR 66 01/14/2021   Lab Results  Component Value Date   CHOL 229 (H) 01/14/2021   Lab Results  Component Value Date   HDL 34 (L) 01/14/2021   Lab Results  Component Value Date   LDLCALC 96 01/14/2021   Lab Results  Component Value Date   TRIG 592 ([place]) 01/14/2021   Lab Results  Component Value Date   CHOLHDL 6.7 (H) 01/14/2021   Lab Results  Component Value Date   HGBA1C 9.2 (H) 01/14/2021  Assessment & Plan:   Problem List Items Addressed This Visit       Cardiovascular and Mediastinum   Essential hypertension, benign - Primary    BP Readings from Last 1 Encounters:  05/06/21 (!) 148/99  uncontrolled with Metoprolol, Losartan and HCTZ Has been taking 1/2 tablet of Losartan, advised to stay compliant with 25 mg QD Counseled for compliance with the medications Advised DASH diet and moderate exercise/walking, at least 150 mins/week         Endocrine   Diabetic peripheral neuropathy (HCC)    Long-standing, severe b/l LE pain On Gabapentin 400 mg BID and Norco PRN SSRI and Valium for chronic pain and anxiety related to neuropathy        Other   Benign prostatic hyperplasia with weak urinary stream    Had started Tamsulosin, but he has not been taking regularly. Referred to Urology for further evaluation as he reports recurrent UTI        No orders of the defined types were placed in this encounter.    Lindell Spar, MD

## 2021-05-06 NOTE — Patient Instructions (Signed)

## 2021-05-06 NOTE — Assessment & Plan Note (Signed)
Had started Tamsulosin, but he has not been taking regularly. Referred to Urology for further evaluation as he reports recurrent UTI

## 2021-05-06 NOTE — Assessment & Plan Note (Signed)
BP Readings from Last 1 Encounters:  05/06/21 (!) 148/99   uncontrolled with Metoprolol, Losartan and HCTZ Has been taking 1/2 tablet of Losartan, advised to stay compliant with 25 mg QD Counseled for compliance with the medications Advised DASH diet and moderate exercise/walking, at least 150 mins/week

## 2021-05-06 NOTE — Assessment & Plan Note (Signed)
Long-standing, severe b/l LE pain On Gabapentin 400 mg BID and Norco PRN SSRI and Valium for chronic pain and anxiety related to neuropathy

## 2021-05-07 DIAGNOSIS — D751 Secondary polycythemia: Secondary | ICD-10-CM | POA: Diagnosis not present

## 2021-05-07 DIAGNOSIS — K861 Other chronic pancreatitis: Secondary | ICD-10-CM | POA: Diagnosis not present

## 2021-05-07 DIAGNOSIS — D45 Polycythemia vera: Secondary | ICD-10-CM | POA: Diagnosis not present

## 2021-05-13 ENCOUNTER — Other Ambulatory Visit: Payer: Self-pay | Admitting: Internal Medicine

## 2021-05-14 ENCOUNTER — Encounter: Payer: Self-pay | Admitting: Urology

## 2021-05-14 ENCOUNTER — Other Ambulatory Visit: Payer: Self-pay

## 2021-05-14 ENCOUNTER — Other Ambulatory Visit: Payer: Self-pay | Admitting: Internal Medicine

## 2021-05-14 ENCOUNTER — Telehealth: Payer: Self-pay

## 2021-05-14 ENCOUNTER — Ambulatory Visit: Payer: PPO | Admitting: Urology

## 2021-05-14 VITALS — BP 153/95 | HR 60 | Temp 98.1°F | Ht 69.5 in | Wt 172.0 lb

## 2021-05-14 DIAGNOSIS — R39198 Other difficulties with micturition: Secondary | ICD-10-CM | POA: Diagnosis not present

## 2021-05-14 DIAGNOSIS — N5201 Erectile dysfunction due to arterial insufficiency: Secondary | ICD-10-CM | POA: Diagnosis not present

## 2021-05-14 DIAGNOSIS — N401 Enlarged prostate with lower urinary tract symptoms: Secondary | ICD-10-CM

## 2021-05-14 DIAGNOSIS — N138 Other obstructive and reflux uropathy: Secondary | ICD-10-CM | POA: Diagnosis not present

## 2021-05-14 DIAGNOSIS — E1142 Type 2 diabetes mellitus with diabetic polyneuropathy: Secondary | ICD-10-CM

## 2021-05-14 DIAGNOSIS — N411 Chronic prostatitis: Secondary | ICD-10-CM

## 2021-05-14 LAB — URINALYSIS, ROUTINE W REFLEX MICROSCOPIC
Bilirubin, UA: NEGATIVE
Glucose, UA: NEGATIVE
Ketones, UA: NEGATIVE
Leukocytes,UA: NEGATIVE
Nitrite, UA: NEGATIVE
RBC, UA: NEGATIVE
Specific Gravity, UA: 1.015 (ref 1.005–1.030)
Urobilinogen, Ur: 0.2 mg/dL (ref 0.2–1.0)
pH, UA: 5.5 (ref 5.0–7.5)

## 2021-05-14 LAB — MICROSCOPIC EXAMINATION
Bacteria, UA: NONE SEEN
RBC, Urine: NONE SEEN /hpf (ref 0–2)
Renal Epithel, UA: NONE SEEN /hpf
WBC, UA: NONE SEEN /hpf (ref 0–5)

## 2021-05-14 MED ORDER — TADALAFIL 5 MG PO TABS: 5.0000 mg | ORAL_TABLET | Freq: Every day | ORAL | 11 refills | Status: DC | PRN

## 2021-05-14 MED ORDER — UNABLE TO FIND: 0 refills | Status: DC

## 2021-05-14 NOTE — Progress Notes (Signed)
Urological Symptom Review  Patient is experiencing the following symptoms: Frequent urination Burning/pain with urination Get up at night to urinate Leakage of urine Trouble starting stream Have to strain to urinate Weak stream Erection problems (male only)   Review of Systems  Gastrointestinal (upper)  : Nausea  Gastrointestinal (lower) : Diarrhea Constipation  Constitutional : Fatigue  Skin: Itching  Eyes: Negative for eye symptoms  Ear/Nose/Throat : Sinus problems  Hematologic/Lymphatic: Negative for Hematologic/Lymphatic symptoms  Cardiovascular : Negative for cardiovascular symptoms  Respiratory : Negative for respiratory symptoms  Endocrine: Negative for endocrine symptoms  Musculoskeletal: Negative for musculoskeletal symptoms  Neurological: Negative for neurological symptoms  Psychologic: Anxiety

## 2021-05-14 NOTE — Telephone Encounter (Signed)
Patient called said [facility] in the compound carries a lotion/cream for neuropathy and needs a prescription.  Call back # [phone], can leave detail message on voicemail.

## 2021-05-14 NOTE — Progress Notes (Signed)
Subjective: 1. BPH with urinary obstruction   2. Slowing, urinary stream   3. Chronic prostatitis   4. Erectile dysfunction due to arterial insufficiency      Consult requested by [name] Dow MD   [name] is a 60 yo male who is sent by Dr. Posey [name] for voiding symptoms.  His last PSA was 1.9 on 06/27/20.  He had a UA and culture on 03/26/21 that were negative.  He was given Levofloxin for the recent flair and finished it 3 days ago after a 2-3 week course.  He remains symptomatic and has left testicular pain.   He has seen Dr. Michela [name] in the past for similar issues.   He was treated with levaquin for chronic prostatitis.  He returns now with a flair that began in July with the last episode a year ago.   It happened after he got dehydrated putting up his pool this year.  He had urgency with frequency and small voids.  He didn't feel he was emptying his bladder.   His on tamsulosin but has been variable with compliance because of some BP issue.  He has no history of stones or GU surgery.   He is a diabetic following pancreatitis in 2001 impacting both the endocrine and exocrine function and has peripheral neuropathy.  He has has polycythemia and has to give blood on occasion.  He has ED and is on sildenafil 50mg  which isn't quite strong enough.  He had a CT stone study in 2020 and the prostate was moderately enlarged with a 4.8cm transverse diameter. His bladder was not distended and he had normal kidneys.   ROS:  ROS  Allergies  Allergen Reactions   Iodinated Diagnostic Agents Hives, Itching and Other (See Comments)    Rash, nausea and flushing Other reaction(s): Other Burning, itching Burning, itching Other reaction(s): Angioedema (ALLERGY/intolerance)    Metoclopramide Hcl Hives    Patient became very spastic   Propofol Other (See Comments)    Other reaction(s): Other (See Comments) Pancreatic attack. Other reaction(s): Other (See Comments) Pancreatic attack. Pancreatic attack.     Iohexol      Desc: HIVES,SOB NEEDS PREP.MEDS    Propofol Other (See Comments)    Pancreatic attack.   Sulfonamide Derivatives     Large hives   Enalapril Cough and Other (See Comments)   Penicillins Rash    Has patient had a PCN reaction causing immediate rash, facial/tongue/throat swelling, SOB or lightheadedness with hypotension: Yes Has patient had a PCN reaction causing severe rash involving mucus membranes or skin necrosis: No Has patient had a PCN reaction that required hospitalization No Has patient had a PCN reaction occurring within the last 10 years: No. If all of the above answers are "NO", then may proceed with Cephalosporin use.    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  Patent coronary bypass grafts with a free radial graft to PDA and LIMA to the  diag.   Diabetes mellitus    Diabetic peripheral neuropathy ([place]) 05/06/2020   Dyslipidemia    Poorly controlled, probably related to his DM (Some of this is secondary to his inability to afford medications).   ED (erectile dysfunction)  Esophageal yeast infection ([place])    Metaplasia of esophagus    Pancreatitis chronic    Pseudocyst   Polycythemia vera(238.4) 04/26/2012    Past Surgical History:  Procedure Laterality Date   APPENDECTOMY     CARDIAC CATHETERIZATION     CHOLECYSTECTOMY     COLONOSCOPY  07/31/1985   COLONOSCOPY N/A 03/11/2016   Procedure: COLONOSCOPY;  Surgeon: Rogene Houston, MD;  [facility];  Service: Endoscopy;  Laterality: N/A;   CORONARY ANGIOPLASTY WITH STENT PLACEMENT  2006   CORONARY ARTERY BYPASS GRAFT  11/24/2004   ESOPHAGOGASTRODUODENOSCOPY  09/19/01   ESOPHAGOGASTRODUODENOSCOPY N/A 03/11/2016   Procedure: ESOPHAGOGASTRODUODENOSCOPY (EGD);  Surgeon:  Rogene Houston, MD;  Location: [facility];  Service: Endoscopy;  Laterality: N/A;  12:00   FINGER TENDON REPAIR     in middle finger   KNEE SURGERY     left knee   NASAL SEPTUM SURGERY      Social History   Socioeconomic History   Marital status: Married    Spouse name: Not on file   Number of children: Not on file   Years of education: Not on file   Highest education level: Not on file  Occupational History   Not on file  Tobacco Use   Smoking status: Never   Smokeless tobacco: Never  Substance and Sexual Activity   Alcohol use: Yes    Comment: Patient may drink a beer or glass of wine a couple times a month   Drug use: No   Sexual activity: Not on file  Other Topics Concern   Not on file  Social History Narrative   The patient lives in [place] with his wife.  He is     disabled secondary to coronary artery disease and pancreatitis.  He does     not smoke, he does not drink alcohol.  There is no drug use.    Social Determinants of Health   Financial Resource Strain: Low Risk    Difficulty of Paying Living Expenses: Not hard at all  Food Insecurity: No Food Insecurity   Worried About Charity fundraiser in the Last Year: Never true   [place] in the Last Year: Never true  Transportation Needs: No Transportation Needs   Lack of Transportation (Medical): No   Lack of Transportation (Non-Medical): No  Physical Activity: Insufficiently Active   Days of Exercise per Week: 3 days   Minutes of Exercise per Session: 20 min  Stress: No Stress Concern Present   Feeling of Stress : Only a little  Social Connections: Moderately Isolated   Frequency of Communication with Friends and Family: More than three times a week   Frequency of Social Gatherings with Friends and Family: Once a week   Attends Religious Services: Never   Marine scientist or Organizations: No   Attends Music therapist: Never   Marital Status: Married  Human resources officer  Violence: Not on file    Family History  Problem Relation Age of Onset   Coronary artery disease Mother        strong family history of   Heart attack Mother        dying of (at age 64)   Diabetes Mother    Diabetes Sister    Diabetes Brother    Pancreatitis Brother    Healthy Daughter    Hyperlipidemia Daughter    Heart disease Brother    Hypertension Brother  Hyperlipidemia Brother     Anti-infectives: Anti-infectives (From admission, onward)    None       Current Outpatient Medications  Medication Sig Dispense Refill   Cholecalciferol 25 MCG (1000 UT) tablet Take 4,000 Units by mouth daily.     clopidogrel (PLAVIX) 75 MG tablet TAKE ONE TABLET BY MOUTH ONCE DAILY AS A BLOOD THINNER. 90 tablet 0   Continuous Blood Gluc Sensor (FREESTYLE LIBRE 2 SENSOR) MISC USE TO CHECK BLOOD SUGAR AS DIRECTED. 2 each 2   diazepam (VALIUM) 10 MG tablet Take 10 mg by mouth at bedtime as needed for anxiety or sleep. As Needed     ezetimibe (ZETIA) 10 MG tablet TAKE ONE TABLET BY MOUTH ONCE DAILY. 90 tablet 0   gabapentin (NEURONTIN) 400 MG capsule TAKE (1) CAPSULE BY MOUTH TWICE DAILY. 180 capsule 0   glucose blood (FREESTYLE TEST STRIPS) test strip Use as instructed 100 each 12   hydrochlorothiazide (HYDRODIURIL) 25 MG tablet TAKE (1) TABLET BY MOUTH TWICE DAILY FOR HIGH BLOOD PRESSURE. (Patient taking differently: Take 25 mg by mouth daily.) 180 tablet 0   HYDROcodone-acetaminophen (NORCO) 10-325 MG tablet TAKE (1) TABLET BY MOUTH FOUR TIMES DAILY FOR SEVERE PAIN. 120 tablet 0   insulin NPH-regular Human (NOVOLIN 70/30) (70-30) 100 UNIT/ML injection Inject 48 Units into the skin 2 (two) times daily before a meal. When glucose is above 90 and eating     lansoprazole (PREVACID) 30 MG capsule Take 1 capsule (30 mg total) by mouth 2 (two) times daily before a meal. (Patient taking differently: Take 30 mg by mouth 2 (two) times daily before a meal. Pt states he takes 4 pills a week) 60 capsule  3   levofloxacin (LEVAQUIN) 500 MG tablet TAKE ONE TABLET BY MOUTH ONCE DAILY FOR7 DAYS. 7 tablet 0   losartan (COZAAR) 25 MG tablet Take 1 tablet (25 mg total) by mouth daily. 30 tablet 3   metFORMIN (GLUCOPHAGE-XR) 500 MG 24 hr tablet TAKE 1 TABLET BY MOUTH ONCE DAILY WITH BREAKFAST. 90 tablet 0   methocarbamol (ROBAXIN) 500 MG tablet Take 1 tablet (500 mg total) by mouth every 8 (eight) hours as needed for muscle spasms. 30 tablet 2   metoprolol tartrate (LOPRESSOR) 25 MG tablet Take 1 tablet (25 mg total) by mouth 2 (two) times daily. 60 tablet 5   nitroGLYCERIN (NITROSTAT) 0.4 MG SL tablet Place 0.4 mg under the tongue every 5 (five) minutes as needed for chest pain. Reported on 02/26/2016     NON FORMULARY Domperidone 10 mg Patient takes 2 by mouth daily     omega-3 acid ethyl esters (LOVAZA) 1 g capsule Take 2 capsules by mouth twice daily 360 capsule 0   ondansetron (ZOFRAN) 4 MG tablet Take 4 mg by mouth every 8 (eight) hours as needed for nausea or vomiting.     Pancrelipase, Lip-Prot-Amyl, 6000 units CPEP Take by mouth as needed.      PARoxetine (PAXIL) 10 MG tablet TAKE ONE TABLET BY MOUTH ONCE DAILY FOR DEPRESSION. 90 tablet 0   promethazine (PHENERGAN) 25 MG tablet Take 25 mg by mouth every 8 (eight) hours as needed for nausea or vomiting.     sildenafil (VIAGRA) 50 MG tablet Take 1 tablet (50 mg total) by mouth daily as needed for erectile dysfunction. 10 tablet 3   tadalafil (CIALIS) 5 MG tablet Take 1 tablet (5 mg total) by mouth daily as needed for erectile dysfunction. 30 tablet 11   tamsulosin (FLOMAX) 0.4  MG CAPS capsule Take 1 capsule (0.4 mg total) by mouth daily. 30 capsule 0   UNABLE TO FIND Med Name: Gabapentin 10%, Lidocaine 10%, Ketoprofen 10% - Use cream twice daily as directed for 30 days 60 g 0   No current facility-administered medications for this visit.     Objective: Vital signs in last 24 hours: BP (!) 153/95   Pulse 60   Temp 98.1 F (36.7 C)   Ht 5'  9.5" (1.765 m)   Wt 172 lb (78 kg)   BMI 25.04 kg/m   Intake/Output from previous day: No intake/output data recorded. Intake/Output this shift: @IOTHISSHIFT @   Physical Exam Vitals reviewed.  Constitutional:      Appearance: Normal appearance.  Cardiovascular:     Rate and Rhythm: Normal rate and regular rhythm.     Heart sounds: Normal heart sounds.  Pulmonary:     Effort: Pulmonary effort is normal.     Breath sounds: Normal breath sounds.  Abdominal:     General: Abdomen is flat.     Palpations: Abdomen is soft.     Tenderness: There is no abdominal tenderness.     Hernia: No hernia is present.  Genitourinary:    Comments: Normal phallus with adequate meatus. Normal testes and epididymis. AP without lesions. NST with mass. Prostate 2+ firm, slightly tender without nodules. SV non-palpable.  Musculoskeletal:        General: No swelling. Normal range of motion.  Skin:    General: Skin is warm and dry.  Neurological:     General: No focal deficit present.     Mental Status: He is alert and oriented to person, place, and time.  Psychiatric:        Mood and Affect: Mood normal.        Behavior: Behavior normal.    Lab Results:  Results for orders placed or performed in visit on 05/14/21 (from the past 24 hour(s))  Urinalysis, Routine w reflex microscopic     Status: Abnormal   Collection Time: 05/14/21  3:21 PM  Result Value Ref Range   Specific Gravity, UA 1.015 1.005 - 1.030   pH, UA 5.5 5.0 - 7.5   Color, UA Yellow Yellow   Appearance Ur Clear Clear   Leukocytes,UA Negative Negative   Protein,UA 2+ (A) Negative/Trace   Glucose, UA Negative Negative   Ketones, UA Negative Negative   RBC, UA Negative Negative   Bilirubin, UA Negative Negative   Urobilinogen, Ur 0.2 0.2 - 1.0 mg/dL   Nitrite, UA Negative Negative   Microscopic Examination See below:    Narrative   Performed at:  [facility] [address] [place], [place]   [id number] Lab Director: Mina Marble MT, Phone:  [phone]  Microscopic Examination     Status: None   Collection Time: 05/14/21  3:21 PM   Urine  Result Value Ref Range   WBC, UA None seen 0 - 5 /hpf   RBC None seen 0 - 2 /hpf   Epithelial Cells (non renal) 0-10 0 - 10 /hpf   Renal Epithel, UA None seen None seen /hpf   Casts Present None seen /lpf   Cast Type Hyaline casts N/A   Mucus, UA Present Not Estab.   Bacteria, UA None seen None seen/Few   Narrative   Performed at:  [facility] [address], [place], [place]  [id number] Lab Director: Raymond, Phone:  [phone]    BMET  No results for input(s): NA, K, CL, CO2, GLUCOSE, BUN, CREATININE, CALCIUM in the last 72 hours. PT/INR No results for input(s): LABPROT, INR in the last 72 hours. ABG No results for input(s): PHART, HCO3 in the last 72 hours.  Invalid input(s): PCO2, PO2  Studies/Results: No results found.   Assessment/Plan: Chronic prostatitis.   He has intermittent flairs of worsening LUTS with left testicular pain and is given Levaquin for his symptoms but his UA's have routinely been negative.   He may have more prostadynia.  BPH with BOO and severe symptoms on tamsulosin.   I am going to have him return for a flowrate, PVR and possible cystoscopy.  ED.  He is not getting a full response to sildenafil.  I will given him daily tadalafil to see if that helps the LUTS and the ED.  He uses NTG prn and I reinforce the absolute contraindication against taking NTG with a PDE5.    Meds ordered this encounter  Medications   tadalafil (CIALIS) 5 MG tablet    Sig: Take 1 tablet (5 mg total) by mouth daily as needed for erectile dysfunction.    Dispense:  30 tablet    Refill:  11      Orders Placed This Encounter  Procedures   Microscopic Examination   Urinalysis, Routine w reflex microscopic     Return for Next available flowrate, PVR and possible cystoscopy. .    CC: Dr. Ihor [name].      Irine Seal 05/14/2021 [phone]

## 2021-05-15 ENCOUNTER — Telehealth: Payer: Self-pay

## 2021-05-15 NOTE — Telephone Encounter (Signed)
Patient called said the medicine that was called into [facility] was not correct.   Says it's # 11 on the list that [place] in the compound department has to make.   Saying its 5 ingredients that goes into this medicine.  Needs it to be changed to the correct breakdown.   Patient asked for the nurse to contact him back # [phone] today if at all possible.

## 2021-05-15 NOTE — Telephone Encounter (Signed)
Sent to [facility]

## 2021-05-15 NOTE — Telephone Encounter (Signed)
Erlene Quan from [place] called needs clarification on medicine that was sent in. Call back # [phone].

## 2021-05-18 ENCOUNTER — Other Ambulatory Visit: Payer: Self-pay | Admitting: Internal Medicine

## 2021-05-19 ENCOUNTER — Telehealth: Payer: Self-pay

## 2021-05-19 ENCOUNTER — Telehealth: Payer: Self-pay | Admitting: Internal Medicine

## 2021-05-19 NOTE — Telephone Encounter (Signed)
Patient returning a call. Patient has not heard from the pharmacy or any other nurse or Dr [name] about the medicine  # 11 on the form that was sent in from [facility]. Please contact patient back at [phone].

## 2021-05-19 NOTE — Telephone Encounter (Signed)
Talked with [name] for about 30 min discussing his discontent that his compound requested was not sent in on Monday 05/18/21 am.  [name] was on the phone with him till about 5:30 Friday 9/23 and we had not received the fax from the pharmacy yet.  [name] received the fax and answered it Monday PM.  He put it in [place] box to follow up on Tuesday.  [name] followed up on the request for the compound and sent it to the Pharmacy per [facility].  [name] said this was not timely.  I explained that the Providers answer calls in the PM after clinic and the nursing staff follow up the following day unless something is marked urgent.  This was not an urgent RX.  I apologized for his discontent with our office on how we handled this rx refill.  I explained our work flow again with him.  The compound has been sent to the Pharmacy.  I apologized again for any delay in the RX compound being called in.

## 2021-05-19 NOTE — Telephone Encounter (Signed)
Called in in refrence to a n

## 2021-05-19 NOTE — Telephone Encounter (Signed)
Spoke with Patient let him know that prescription was sent to [facility] and he should be able to check with his pharmacy and if they did not have the prescription to call us back as this was faxed not long ago. He got upset and said he had been dealing with this for a whole week. I advised the pt that we did send in the compound cream as requested the first time and he was not happy with this after speaking to [place]. They advised they had different compounds and they would send a list to [place] of patient request. He called late Friday afternoon. This compound list was received in [place] box on Monday. Due to the fact he sees patients this did not get signed until Monday afternoon after pts and placed In my box on Tuesday morning. I sent the prescription back to the pharmacy as soon as I could. Patient said I was wrong as this had been sitting in the dr box for a day and half. Let provider know he was unhappy and transferred call to office manager per pt request

## 2021-05-19 NOTE — Telephone Encounter (Signed)
error 

## 2021-05-26 ENCOUNTER — Encounter: Payer: PPO | Admitting: Internal Medicine

## 2021-05-28 ENCOUNTER — Ambulatory Visit: Payer: PPO | Admitting: Urology

## 2021-06-02 ENCOUNTER — Other Ambulatory Visit: Payer: Self-pay | Admitting: Internal Medicine

## 2021-06-02 DIAGNOSIS — I10 Essential (primary) hypertension: Secondary | ICD-10-CM

## 2021-06-05 ENCOUNTER — Other Ambulatory Visit: Payer: Self-pay | Admitting: "Endocrinology

## 2021-06-30 ENCOUNTER — Other Ambulatory Visit: Payer: Self-pay | Admitting: Internal Medicine

## 2021-06-30 DIAGNOSIS — E782 Mixed hyperlipidemia: Secondary | ICD-10-CM

## 2021-07-13 ENCOUNTER — Other Ambulatory Visit: Payer: Self-pay | Admitting: Internal Medicine

## 2021-07-15 ENCOUNTER — Ambulatory Visit: Payer: PPO | Admitting: Internal Medicine

## 2021-07-15 ENCOUNTER — Other Ambulatory Visit: Payer: Self-pay | Admitting: Internal Medicine

## 2021-07-15 DIAGNOSIS — E1142 Type 2 diabetes mellitus with diabetic polyneuropathy: Secondary | ICD-10-CM

## 2021-07-21 DIAGNOSIS — T466X5A Adverse effect of antihyperlipidemic and antiarteriosclerotic drugs, initial encounter: Secondary | ICD-10-CM

## 2021-07-21 NOTE — Progress Notes (Signed)
[name])                                            [name]                                        Statin Quality Measure Assessment    07/21/2021  [name] 07/05/1961 [id number]  Dr. [name],   I am a [employer] clinical pharmacist that reviews patients for statin quality initiatives.     Per review of chart and payor information, patient has a diagnosis of diabetes and cardiovascular disease but is not currently filling a statin prescription.  This places patient into the SUPD (Statin Use In Patients with Diabetes) and SPC (Statin Use in Patients with Cardiovascular Disease) measures for CMS.    Patient has documented allergy to statin but no corresponding CPT codes that would exclude patient from SUPD and [facility] measure.      Component Value Date/Time   CHOL 229 (H) 01/14/2021 1348   TRIG 592 (HH) 01/14/2021 1348   HDL 34 (L) 01/14/2021 1348   CHOLHDL 6.7 (H) 01/14/2021 1348   CHOLHDL 6.9 (H) 08/19/2016 1259   VLDL NOT CALC 08/19/2016 1259   LDLCALC 96 01/14/2021 1348   LDLDIRECT 110 08/19/2016 1259    Please consider ONE of the following recommendations:  Initiate high intensity statin Atorvastatin 40mg  once daily, #90, 3 refills   Rosuvastatin 20mg  once daily, #90, 3 refills    Initiate moderate intensity  statin with reduced frequency if prior  statin intolerance 1x weekly, #13, 3 refills   2x weekly, #26, 3 refills   3x weekly, #39, 3 refills    Code for past statin intolerance  (required annually)  Provider Requirements: Must associate code during an office visit or telehealth encounter   Drug Induced Myopathy G72.0   Myositis, unspecified M60.9   Myopathy, unspecified G72.9   Rhabdomyolysis  X43.56   Alcoholic cirrhosis of liver without ascites Y61.68   Alcoholic cirrhosis of liver with ascites K70.31   Unspecified cirrhosis of liver K74.60   Toxic liver disease with fibrosis and  cirrhosis of liver K71.7      Please let us know your decision.    Thank you!   [name], PharmD Clinical Pharmacist  [place] [phone]

## 2021-07-22 ENCOUNTER — Other Ambulatory Visit: Payer: Self-pay

## 2021-07-22 ENCOUNTER — Encounter: Payer: Self-pay | Admitting: Internal Medicine

## 2021-07-22 ENCOUNTER — Ambulatory Visit (INDEPENDENT_AMBULATORY_CARE_PROVIDER_SITE_OTHER): Payer: PPO | Admitting: Internal Medicine

## 2021-07-22 VITALS — BP 142/84 | HR 68 | Resp 18 | Ht 69.5 in | Wt 176.1 lb

## 2021-07-22 DIAGNOSIS — D45 Polycythemia vera: Secondary | ICD-10-CM

## 2021-07-22 DIAGNOSIS — Z0001 Encounter for general adult medical examination with abnormal findings: Secondary | ICD-10-CM | POA: Diagnosis not present

## 2021-07-22 DIAGNOSIS — E782 Mixed hyperlipidemia: Secondary | ICD-10-CM | POA: Diagnosis not present

## 2021-07-22 DIAGNOSIS — E1140 Type 2 diabetes mellitus with diabetic neuropathy, unspecified: Secondary | ICD-10-CM

## 2021-07-22 DIAGNOSIS — I10 Essential (primary) hypertension: Secondary | ICD-10-CM | POA: Diagnosis not present

## 2021-07-22 DIAGNOSIS — E1142 Type 2 diabetes mellitus with diabetic polyneuropathy: Secondary | ICD-10-CM | POA: Diagnosis not present

## 2021-07-22 DIAGNOSIS — Z0000 Encounter for general adult medical examination without abnormal findings: Secondary | ICD-10-CM

## 2021-07-22 DIAGNOSIS — Z125 Encounter for screening for malignant neoplasm of prostate: Secondary | ICD-10-CM

## 2021-07-22 DIAGNOSIS — Z794 Long term (current) use of insulin: Secondary | ICD-10-CM

## 2021-07-22 DIAGNOSIS — Z789 Other specified health status: Secondary | ICD-10-CM | POA: Diagnosis not present

## 2021-07-22 NOTE — Assessment & Plan Note (Signed)
JAK2 negative Gets venipuncture every 3-4 months F/u Heme/Onc.

## 2021-07-22 NOTE — Assessment & Plan Note (Signed)
Lab Results  Component Value Date   HGBA1C 8.1 (A) 05/06/2021   On Novolin 50 U BID and Metformin, f/u with Dr Dorris Fetch Advised to follow diabetic diet Not on any statin due to statin intolerance Diabetic eye exam: Advised to follow up with Ophthalmology for diabetic eye exam

## 2021-07-22 NOTE — Assessment & Plan Note (Signed)
BP Readings from Last 1 Encounters:  07/22/21 (!) 142/84   Better controlled with Metoprolol, Losartan and HCTZ Counseled for compliance with the medications Advised DASH diet and moderate exercise/walking, at least 150 mins/week

## 2021-07-22 NOTE — Assessment & Plan Note (Signed)
Long-standing, severe b/l LE pain On Gabapentin 400 mg BID and Norco PRN SSRI and Valium for chronic pain and anxiety related to neuropathy Has Gabapentin cream for neuropathy

## 2021-07-22 NOTE — Assessment & Plan Note (Signed)
Had severe myalgia and worsening of neuropathy pain with statin Has tried 2 different statins in the past.

## 2021-07-22 NOTE — Patient Instructions (Addendum)
Please continue taking medications as prescribed.   Please get fasting blood tests done before the next visit. 

## 2021-07-22 NOTE — Progress Notes (Signed)
Established Patient Office Visit  Subjective:  Patient ID: [name], male    DOB: [birth year]  Age: 60 y.o. MRN: [id number]  CC:  Chief Complaint  Patient presents with   Follow-up    2 month follow up     HPI [name] is a 60 y.o. male with past medical history of CAD status post CABG, hypertension, uncontrolled diabetes mellitus-on insulin, severe diabetic neuropathy, chronic pancreatitis, polycythemia vera, hyperlipidemia, cervical spinal stenosis and chronic back pain who presents for follow up of his chronic medical conditions.  HTN: BP is better controlled now. Takes medications regularly. Patient denies headache, dizziness, chest pain, dyspnea or palpitations.  DM: HbA1C is 8.1 now. He had visit with Dr Fransico Him and his insulin dose was increased to 50 U BID. He also takes Metformin. Denies any polyuria or polyphagia currently.  He has CGM device, and his blood glucose runs around 150-200 most of the time.  Diabetic neuropathy: Takes gabapentin and as needed Norco for severe pain.  He has been using gabapentin cream as well for neuropathy now and feels better with it.  He has not been able to see hematology clinic for polycythemia recently.    Past Medical History:  Diagnosis Date   Anemia    Bypass graft stenosis [facility])    '06   Cervical spinal stenosis 05/06/2020   C5-6 level   Chronic gastritis    Coronary artery disease    Patent Stent to Circ.  Patent coronary bypass grafts with a free radial graft to PDA and LIMA to the  diag.   Diabetes mellitus    Diabetic peripheral neuropathy (HCC) 05/06/2020   Dyslipidemia    Poorly controlled, probably related to his DM (Some of this is secondary to his inability to afford medications).   ED (erectile dysfunction)    Esophageal yeast infection (HCC)    Metaplasia of esophagus    Pancreatitis chronic    Pseudocyst   Polycythemia vera(238.4) 04/26/2012    Past Surgical History:  Procedure Laterality Date    APPENDECTOMY     CARDIAC CATHETERIZATION     CHOLECYSTECTOMY     COLONOSCOPY  07/31/1985   COLONOSCOPY N/A 03/11/2016   Procedure: COLONOSCOPY;  Surgeon: Malissa Hippo, MD;  Location: [facility];  Service: Endoscopy;  Laterality: N/A;   CORONARY ANGIOPLASTY WITH STENT PLACEMENT  2006   CORONARY ARTERY BYPASS GRAFT  11/24/2004   ESOPHAGOGASTRODUODENOSCOPY  09/19/01   ESOPHAGOGASTRODUODENOSCOPY N/A 03/11/2016   Procedure: ESOPHAGOGASTRODUODENOSCOPY (EGD);  Surgeon: Malissa Hippo, MD;  Location: [facility];  Service: Endoscopy;  Laterality: N/A;  12:00   FINGER TENDON REPAIR     in middle finger   KNEE SURGERY     left knee   NASAL SEPTUM SURGERY      Family History  Problem Relation Age of Onset   Coronary artery disease Mother        strong family history of   Heart attack Mother        dying of (at age 13)   Diabetes Mother    Diabetes Sister    Diabetes Brother    Pancreatitis Brother    Healthy Daughter    Hyperlipidemia Daughter    Heart disease Brother    Hypertension Brother    Hyperlipidemia Brother     Social History   Socioeconomic History   Marital status: Married    Spouse name: Not on file   Number of children: Not on file  Years of education: Not on file   Highest education level: Not on file  Occupational History   Not on file  Tobacco Use   Smoking status: Never   Smokeless tobacco: Never  Substance and Sexual Activity   Alcohol use: Yes    Comment: Patient may drink a beer or glass of wine a couple times a month   Drug use: No   Sexual activity: Not on file  Other Topics Concern   Not on file  Social History Narrative   The patient lives in [place] with his wife.  He is     disabled secondary to coronary artery disease and pancreatitis.  He does     not smoke, he does not drink alcohol.  There is no drug use.    Social Determinants of Health   Financial Resource Strain: Low Risk    Difficulty of Paying Living Expenses: Not hard  at all  Food Insecurity: No Food Insecurity   Worried About Charity fundraiser in the Last Year: Never true   [place] in the Last Year: Never true  Transportation Needs: No Transportation Needs   Lack of Transportation (Medical): No   Lack of Transportation (Non-Medical): No  Physical Activity: Insufficiently Active   Days of Exercise per Week: 3 days   Minutes of Exercise per Session: 20 min  Stress: No Stress Concern Present   Feeling of Stress : Only a little  Social Connections: Moderately Isolated   Frequency of Communication with Friends and Family: More than three times a week   Frequency of Social Gatherings with Friends and Family: Once a week   Attends Religious Services: Never   Marine scientist or Organizations: No   Attends Archivist Meetings: Never   Marital Status: Married  Human resources officer Violence: Not on file    Outpatient Medications Prior to Visit  Medication Sig Dispense Refill   Cholecalciferol 25 MCG (1000 UT) tablet Take 4,000 Units by mouth daily.     clopidogrel (PLAVIX) 75 MG tablet TAKE ONE TABLET BY MOUTH ONCE DAILY AS A BLOOD THINNER. 90 tablet 0   Continuous Blood Gluc Sensor (FREESTYLE LIBRE 2 SENSOR) MISC USE TO CHECK BLOOD SUGAR AS DIRECTED. 6 each 0   diazepam (VALIUM) 10 MG tablet Take 10 mg by mouth at bedtime as needed for anxiety or sleep. As Needed     ezetimibe (ZETIA) 10 MG tablet TAKE ONE TABLET BY MOUTH ONCE DAILY. 90 tablet 0   gabapentin (NEURONTIN) 400 MG capsule TAKE (1) CAPSULE BY MOUTH TWICE DAILY. 180 capsule 0   glucose blood (FREESTYLE TEST STRIPS) test strip Use as instructed 100 each 12   hydrochlorothiazide (HYDRODIURIL) 25 MG tablet TAKE (1) TABLET BY MOUTH TWICE DAILY FOR HIGH BLOOD PRESSURE. (Patient taking differently: Take 25 mg by mouth daily.) 180 tablet 0   HYDROcodone-acetaminophen (NORCO) 10-325 MG tablet TAKE (1) TABLET BY MOUTH FOUR TIMES DAILY FOR SEVERE PAIN. 120 tablet 0   insulin  NPH-regular Human (NOVOLIN 70/30) (70-30) 100 UNIT/ML injection Inject 48 Units into the skin 2 (two) times daily before a meal. When glucose is above 90 and eating     lansoprazole (PREVACID) 30 MG capsule Take 1 capsule (30 mg total) by mouth 2 (two) times daily before a meal. (Patient taking differently: Take 30 mg by mouth 2 (two) times daily before a meal. Pt states he takes 4 pills a week) 60 capsule 3   levofloxacin (LEVAQUIN) 500  MG tablet TAKE ONE TABLET BY MOUTH ONCE DAILY FOR7 DAYS. 7 tablet 0   losartan (COZAAR) 25 MG tablet TAKE (1) TABLET BY MOUTH ONCE DAILY. 30 tablet 0   metFORMIN (GLUCOPHAGE-XR) 500 MG 24 hr tablet TAKE 1 TABLET BY MOUTH ONCE DAILY WITH BREAKFAST. 90 tablet 1   methocarbamol (ROBAXIN) 500 MG tablet Take 1 tablet (500 mg total) by mouth every 8 (eight) hours as needed for muscle spasms. 30 tablet 2   metoprolol tartrate (LOPRESSOR) 25 MG tablet Take 1 tablet (25 mg total) by mouth 2 (two) times daily. 60 tablet 5   nitroGLYCERIN (NITROSTAT) 0.4 MG SL tablet Place 0.4 mg under the tongue every 5 (five) minutes as needed for chest pain. Reported on 02/26/2016     NON FORMULARY Domperidone 10 mg Patient takes 2 by mouth daily     omega-3 acid ethyl esters (LOVAZA) 1 g capsule Take 2 capsules by mouth twice daily 360 capsule 0   ondansetron (ZOFRAN) 4 MG tablet Take 4 mg by mouth every 8 (eight) hours as needed for nausea or vomiting.     Pancrelipase, Lip-Prot-Amyl, 6000 units CPEP Take by mouth as needed.      PARoxetine (PAXIL) 10 MG tablet TAKE ONE TABLET BY MOUTH ONCE DAILY FOR DEPRESSION. 90 tablet 0   promethazine (PHENERGAN) 25 MG tablet Take 25 mg by mouth every 8 (eight) hours as needed for nausea or vomiting.     sildenafil (VIAGRA) 50 MG tablet Take 1 tablet (50 mg total) by mouth daily as needed for erectile dysfunction. 10 tablet 3   tadalafil (CIALIS) 5 MG tablet Take 1 tablet (5 mg total) by mouth daily as needed for erectile dysfunction. 30 tablet 11    tamsulosin (FLOMAX) 0.4 MG CAPS capsule Take 1 capsule (0.4 mg total) by mouth daily. 30 capsule 0   UNABLE TO FIND Med Name: Gabapentin 10%, Lidocaine 10%, Ketoprofen 10% - Use cream twice daily as directed for 30 days 60 g 0   No facility-administered medications prior to visit.    Allergies  Allergen Reactions   Iodinated Diagnostic Agents Hives, Itching and Other (See Comments)    Rash, nausea and flushing Other reaction(s): Other Burning, itching Burning, itching Other reaction(s): Angioedema (ALLERGY/intolerance)    Metoclopramide Hcl Hives    Patient became very spastic   Propofol Other (See Comments)    Other reaction(s): Other (See Comments) Pancreatic attack. Other reaction(s): Other (See Comments) Pancreatic attack. Pancreatic attack.    Iohexol      Desc: HIVES,SOB NEEDS PREP.MEDS    Propofol Other (See Comments)    Pancreatic attack.   Sulfonamide Derivatives     Large hives   Enalapril Cough and Other (See Comments)   Penicillins Rash    Has patient had a PCN reaction causing immediate rash, facial/tongue/throat swelling, SOB or lightheadedness with hypotension: Yes Has patient had a PCN reaction causing severe rash involving mucus membranes or skin necrosis: No Has patient had a PCN reaction that required hospitalization No Has patient had a PCN reaction occurring within the last 10 years: No. If all of the above answers are "NO", then may proceed with Cephalosporin use.    Sulfa Antibiotics Rash    Other Reaction: Not Assessed    ROS Review of Systems  Constitutional:  Negative for chills and fever.  HENT:  Negative for congestion and sore throat.   Eyes:  Negative for pain and discharge.  Respiratory:  Negative for cough and shortness of breath.  Cardiovascular:  Negative for palpitations.  Gastrointestinal:  Negative for constipation, diarrhea, nausea and vomiting.  Endocrine: Negative for polydipsia and polyuria.  Genitourinary:  Positive for  difficulty urinating. Negative for hematuria.  Musculoskeletal:  Positive for arthralgias, back pain and neck pain. Negative for neck stiffness.  Skin:  Negative for rash.  Neurological:  Positive for numbness (B/l LE, intermittent). Negative for dizziness, weakness and headaches.  Psychiatric/Behavioral:  Negative for agitation and behavioral problems.      Objective:    Physical Exam Vitals reviewed.  Constitutional:      General: He is not in acute distress.    Appearance: He is not diaphoretic.  HENT:     Head: Normocephalic and atraumatic.     Nose: Nose normal.     Mouth/Throat:     Mouth: Mucous membranes are moist.  Eyes:     General: No scleral icterus.    Extraocular Movements: Extraocular movements intact.  Cardiovascular:     Rate and Rhythm: Normal rate and regular rhythm.     Pulses: Normal pulses.     Heart sounds: Normal heart sounds. No murmur heard. Pulmonary:     Breath sounds: Normal breath sounds. No wheezing or rales.  Abdominal:     Palpations: Abdomen is soft.     Tenderness: There is no abdominal tenderness. There is no right CVA tenderness or left CVA tenderness.  Musculoskeletal:     Cervical back: Neck supple. No tenderness.     Right lower leg: No edema.     Left lower leg: No edema.  Skin:    General: Skin is warm.     Findings: No rash.  Neurological:     General: No focal deficit present.     Mental Status: He is alert and oriented to person, place, and time.     Sensory: No sensory deficit.     Motor: No weakness.  Psychiatric:        Mood and Affect: Mood normal.        Behavior: Behavior normal.    BP (!) 142/84 (BP Location: Left Arm, Patient Position: Sitting, Cuff Size: Normal)   Pulse 68   Resp 18   Ht 5' 9.5" (1.765 m)   Wt 176 lb 1.9 oz (79.9 kg)   SpO2 96%   BMI 25.64 kg/m  Wt Readings from Last 3 Encounters:  07/22/21 176 lb 1.9 oz (79.9 kg)  05/14/21 172 lb (78 kg)  05/06/21 170 lb 12.8 oz (77.5 kg)    Lab  Results  Component Value Date   TSH 1.870 01/14/2021   Lab Results  Component Value Date   WBC 5.5 01/14/2021   HGB 14.0 01/14/2021   HCT 45.2 01/14/2021   MCV 77 (L) 01/14/2021   PLT 232 01/14/2021   Lab Results  Component Value Date   NA 137 01/14/2021   K 4.2 01/14/2021   CO2 21 01/14/2021   GLUCOSE 171 (H) 01/14/2021   BUN 20 01/14/2021   CREATININE 1.26 01/14/2021   BILITOT 0.5 01/14/2021   ALKPHOS 55 01/14/2021   AST 23 01/14/2021   ALT 17 01/14/2021   PROT 6.6 01/14/2021   ALBUMIN 4.2 01/14/2021   CALCIUM 9.4 01/14/2021   ANIONGAP 13 01/08/2018   EGFR 66 01/14/2021   Lab Results  Component Value Date   CHOL 229 (H) 01/14/2021   Lab Results  Component Value Date   HDL 34 (L) 01/14/2021   Lab Results  Component Value Date  [place] 96 01/14/2021   Lab Results  Component Value Date   TRIG 592 (HH) 01/14/2021   Lab Results  Component Value Date   CHOLHDL 6.7 (H) 01/14/2021   Lab Results  Component Value Date   HGBA1C 8.1 (A) 05/06/2021      Assessment & Plan:   Problem List Items Addressed This Visit       Cardiovascular and Mediastinum   Essential hypertension, benign    BP Readings from Last 1 Encounters:  07/22/21 (!) 142/84  Better controlled with Metoprolol, Losartan and HCTZ Counseled for compliance with the medications Advised DASH diet and moderate exercise/walking, at least 150 mins/week       Relevant Orders   Urinalysis     Endocrine   Diabetic peripheral neuropathy (HCC)    Long-standing, severe b/l LE pain On Gabapentin 400 mg BID and Norco PRN SSRI and Valium for chronic pain and anxiety related to neuropathy Has Gabapentin cream for neuropathy      Type 2 diabetes mellitus with diabetic neuropathy, with long-term current use of insulin (HCC) - Primary    Lab Results  Component Value Date   HGBA1C 8.1 (A) 05/06/2021  On Novolin 50 U BID and Metformin, f/u with Dr Dorris Fetch Advised to follow diabetic diet Not on any  statin due to statin intolerance Diabetic eye exam: Advised to follow up with Ophthalmology for diabetic eye exam      Relevant Orders   Urinalysis   Hemoglobin A1c   CMP14+EGFR     Other   Polycythemia vera ([place])    JAK2 negative Gets venipuncture every 3-4 months F/u Heme/Onc.      Relevant Orders   CBC with Differential/Platelet   Mixed hyperlipidemia   Relevant Orders   Lipid Profile   Statin intolerance    Had severe myalgia and worsening of neuropathy pain with statin Has tried 2 different statins in the past.      Prostate cancer screening   Relevant Orders   PSA   Other Visit Diagnoses     Annual physical exam       Relevant Orders   Urinalysis   VITAMIN D 25 Hydroxy (Vit-D Deficiency, Fractures)   TSH       No orders of the defined types were placed in this encounter.   Follow-up: Return in about 3 months (around 10/20/2021) for Annual physical.    Lindell Spar, MD

## 2021-07-27 ENCOUNTER — Other Ambulatory Visit: Payer: Self-pay | Admitting: "Endocrinology

## 2021-08-02 NOTE — Progress Notes (Addendum)
Cardiology Office Note   Date:  08/05/2021   ID:  [name], [name] 1960-12-01, MRN [id number]  PCP:  [name], MD  Cardiologist:   Rollene Rotunda, MD   Chief Complaint  Patient presents with   Chest Pain    History of Present Illness: [name] is a 60 y.o. male who presents for followup of his known coronary disease.  In 2016 he had chest pain but stress perfusion study demonstrated an EF of 60% with no ischemia.  He had an echo in Jan 2021.  This was normal.    He has had chest discomfort sporadically and takes nitroglycerin periodically.  However, more recently his discomfort is somewhat more intense.  He said to take a couple of nitroglycerin at times to get rid of this.  Of the last few days he has had more discomfort.  Seems to be happening at rest.  It is not happening necessarily with exertion.  It is a same kind of discomfort he had before and similar to his previous symptoms though more intense. Marland Kitchen  He has not had any associated nausea vomiting or diaphoresis.  He is not having any new palpitations, presyncope or syncope.  Of note he does say that his discomfort goes away after he goes to the bathroom.     Past Medical History:  Diagnosis Date   Anemia    Bypass graft stenosis [facility])    '06   Cervical spinal stenosis 05/06/2020   C5-6 level   Chronic gastritis    Coronary artery disease    Patent Stent to Circ.  Patent coronary bypass grafts with a free radial graft to PDA and LIMA to the  diag.   Diabetes mellitus    Diabetic peripheral neuropathy (HCC) 05/06/2020   Dyslipidemia    Poorly controlled, probably related to his DM (Some of this is secondary to his inability to afford medications).   ED (erectile dysfunction)    Esophageal yeast infection (HCC)    Metaplasia of esophagus    Pancreatitis chronic    Pseudocyst   Polycythemia vera(238.4) 04/26/2012    Past Surgical History:  Procedure Laterality Date   APPENDECTOMY     CARDIAC  CATHETERIZATION     CHOLECYSTECTOMY     COLONOSCOPY  07/31/1985   COLONOSCOPY N/A 03/11/2016   Procedure: COLONOSCOPY;  Surgeon: Malissa Hippo, MD;  [facility];  Service: Endoscopy;  Laterality: N/A;   CORONARY ANGIOPLASTY WITH STENT PLACEMENT  2006   CORONARY ARTERY BYPASS GRAFT  11/24/2004   ESOPHAGOGASTRODUODENOSCOPY  09/19/01   ESOPHAGOGASTRODUODENOSCOPY N/A 03/11/2016   Procedure: ESOPHAGOGASTRODUODENOSCOPY (EGD);  Surgeon: Malissa Hippo, MD;  Location: [facility];  Service: Endoscopy;  Laterality: N/A;  12:00   FINGER TENDON REPAIR     in middle finger   KNEE SURGERY     left knee   NASAL SEPTUM SURGERY       Current Outpatient Medications  Medication Sig Dispense Refill   Cholecalciferol 25 MCG (1000 UT) tablet Take 4,000 Units by mouth daily.     clopidogrel (PLAVIX) 75 MG tablet TAKE ONE TABLET BY MOUTH ONCE DAILY AS A BLOOD THINNER. 90 tablet 0   Continuous Blood Gluc Sensor (FREESTYLE LIBRE 2 SENSOR) MISC USE TO CHECK BLOOD SUGAR AS DIRECTED. 6 each 0   diazepam (VALIUM) 10 MG tablet Take 10 mg by mouth at bedtime as needed for anxiety or sleep. As Needed     ezetimibe (ZETIA)  10 MG tablet TAKE ONE TABLET BY MOUTH ONCE DAILY. 90 tablet 0   gabapentin (NEURONTIN) 400 MG capsule TAKE (1) CAPSULE BY MOUTH TWICE DAILY. 180 capsule 0   glucose blood (FREESTYLE TEST STRIPS) test strip Use as instructed 100 each 12   hydrochlorothiazide (HYDRODIURIL) 25 MG tablet TAKE (1) TABLET BY MOUTH TWICE DAILY FOR HIGH BLOOD PRESSURE. 180 tablet 0   HYDROcodone-acetaminophen (NORCO) 10-325 MG tablet TAKE (1) TABLET BY MOUTH FOUR TIMES DAILY FOR SEVERE PAIN. 120 tablet 0   insulin NPH-regular Human (NOVOLIN 70/30) (70-30) 100 UNIT/ML injection Inject 48 Units into the skin 2 (two) times daily before a meal. When glucose is above 90 and eating     lansoprazole (PREVACID) 30 MG capsule Take 1 capsule (30 mg total) by mouth 2 (two) times daily before a meal. (Patient taking  differently: Take 30 mg by mouth 2 (two) times daily before a meal. Pt states he takes 4 pills a week) 60 capsule 3   levofloxacin (LEVAQUIN) 500 MG tablet TAKE ONE TABLET BY MOUTH ONCE DAILY FOR7 DAYS. 7 tablet 0   losartan (COZAAR) 50 MG tablet Take 1 tablet (50 mg total) by mouth daily. 90 tablet 3   metFORMIN (GLUCOPHAGE-XR) 500 MG 24 hr tablet TAKE 1 TABLET BY MOUTH ONCE DAILY WITH BREAKFAST. 90 tablet 1   methocarbamol (ROBAXIN) 500 MG tablet Take 1 tablet (500 mg total) by mouth every 8 (eight) hours as needed for muscle spasms. 30 tablet 2   metoprolol tartrate (LOPRESSOR) 25 MG tablet Take 1 tablet (25 mg total) by mouth 2 (two) times daily. 60 tablet 5   nitroGLYCERIN (NITROSTAT) 0.4 MG SL tablet Place 0.4 mg under the tongue every 5 (five) minutes as needed for chest pain. Reported on 02/26/2016     NON FORMULARY Domperidone 10 mg Patient takes 2 by mouth daily     omega-3 acid ethyl esters (LOVAZA) 1 g capsule Take 2 capsules by mouth twice daily 360 capsule 0   ondansetron (ZOFRAN) 4 MG tablet Take 4 mg by mouth every 8 (eight) hours as needed for nausea or vomiting.     Pancrelipase, Lip-Prot-Amyl, 6000 units CPEP Take by mouth as needed.      PARoxetine (PAXIL) 10 MG tablet TAKE ONE TABLET BY MOUTH ONCE DAILY FOR DEPRESSION. 90 tablet 0   promethazine (PHENERGAN) 25 MG tablet Take 25 mg by mouth every 8 (eight) hours as needed for nausea or vomiting.     tamsulosin (FLOMAX) 0.4 MG CAPS capsule Take 1 capsule (0.4 mg total) by mouth daily. 30 capsule 0   tadalafil (CIALIS) 5 MG tablet Take 1 tablet (5 mg total) by mouth daily as needed for erectile dysfunction. (Patient not taking: Reported on 08/04/2021) 30 tablet 11   No current facility-administered medications for this visit.    Allergies:   Iodinated diagnostic agents, Metoclopramide hcl, Propofol, Iohexol, Propofol, Sulfonamide derivatives, Enalapril, Penicillins, and Sulfa antibiotics    ROS:  Please see the history of  present illness.   Otherwise, review of systems are positive for none    All other systems are reviewed and negative.    PHYSICAL EXAM: VS:  BP (!) 188/100   Pulse 74   Ht $R'5\' 9"'Ca$  (1.753 m)   Wt 177 lb 3.2 oz (80.4 kg)   SpO2 96%   BMI 26.17 kg/m  , BMI Body mass index is 26.17 kg/m. GENERAL:  Well appearing NECK:  No jugular venous distention, waveform within normal limits, carotid upstroke  brisk and symmetric, no bruits, no thyromegaly LUNGS:  Clear to auscultation bilaterally CHEST:  Well healed sternotomy scar. HEART:  PMI not displaced or sustained,S1 and S2 within normal limits, no S3, no S4, no clicks, no rubs, no murmurs ABD:  Flat, positive bowel sounds normal in frequency in pitch, no bruits, no rebound, no guarding, no midline pulsatile mass, no hepatomegaly, no splenomegaly EXT:  2 plus pulses throughout, no edema, no cyanosis no clubbing  EKG:  EKG is ordered today. Sinus rhythm, rate 74, axis within normal limits, intervals within normal limits, no acute ST-T wave changes  (Note I looked through several EKGs and the mild ST elevation in V2 is not new compared to previous.)    Recent Labs: 01/14/2021: ALT 17; BUN 20; Creatinine, Ser 1.26; Hemoglobin 14.0; Platelets 232; Potassium 4.2; Sodium 137; TSH 1.870      Wt Readings from Last 3 Encounters:  08/04/21 177 lb 3.2 oz (80.4 kg)  07/22/21 176 lb 1.9 oz (79.9 kg)  05/14/21 172 lb (78 kg)      Other studies Reviewed: Additional studies/ records that were reviewed today include: None Review of the above records demonstrates:   NA   ASSESSMENT AND PLAN:   CAD:     He has chest pain similar to his previous complaints although he has had to take a few more NTG than previous.   This has non anginal greater than anginal features.  There are no acute EKG changes.  I would like to screen him with a Lexiscan Myoview. He knows to present to the ED with any pain no responsive in the usual fashion to his NTG.    HTN:   The  blood pressure is elevated but he had a very hard time driving here.  I will increase the Cozaar to 50 mg daily.   I am going to move up his labs ordered by his PCP to follow up on his BMET.    HYPERLIPIDEMIA:      He has been intolerant of Repatha and statins.   Cotninue current therapy.   His triglycerides are elevated.  Dietary management should focus on attaining and maintaining a healthy weight and reduction of intake of simple carbohydrates, (<6 percent calories of added sugar and 30 to 35 percent calories of total fat.)  Dietary fat is not a primary source for liver TG, and higher-fat diets do not raise fasting plasma TG levels in most people. Nevertheless, a change in the types of fat (ie, reducing saturated versus poly- and monounsaturated fats) is recommended . I suggest increased consumption of fish that contain high amounts of omega-3 fatty acids (baked not fried.)    I have suggest follow up with endocrinology.    DM: His last A1C was 8.1.  This is down from his previous.   I will defer to Lindell Spar, MD  DECREASED VIT D:  The patient had a previous low vitamin D level and I will repeat this.  SCREENING:  I will order a PSA for screening purposes.  His family medicine MD wanted to draw this so I will include it in the labs today.    ADDENDUM:  08/06/2021 I spoke with Dr. Roger Kill at [facility].  The patient presented and was noted to have a hemoglobin of 17.6.  Has been noncompliant with phlebotomy.  She was concerned that he could be having chest discomfort related to this.  We discussed the timing of stress testing.  She plans to phlebotomize him 500 cc weekly trying to bring his hemoglobin down to 15.  I think is very reasonable to see if this improves his chest pain symptoms.  I will defer the perfusion study until at least 2 weeks from now.  By then he should have some response to phlebotomy.  He understands that if his chest pain gets worse rather than getting  better he should present to the emergency room.  Current medicines are reviewed at length with the patient today.  The patient does not have concerns regarding medicines.  The following changes have been made:   As above  Labs/ tests ordered today include:    Orders Placed This Encounter  Procedures   NM Myocar Multi W/Spect W/Wall Motion / EF   Urinalysis   Lipid panel   CBC with Differential/Platelet   CMP14+EGFR   Hemoglobin A1c   VITAMIN D 25 Hydroxy (Vit-D Deficiency, Fractures)   PSA   TSH   Cardiac Stress Test: Informed Consent Details: Physician/Practitioner Attestation; Transcribe to consent form and obtain patient signature   EKG 12-Lead      Disposition:   FU with me in 2 months.     Signed, Minus Breeding, MD  08/05/2021 8:34 AM    [facility]

## 2021-08-03 ENCOUNTER — Other Ambulatory Visit: Payer: Self-pay | Admitting: Internal Medicine

## 2021-08-03 DIAGNOSIS — I10 Essential (primary) hypertension: Secondary | ICD-10-CM

## 2021-08-04 ENCOUNTER — Ambulatory Visit: Payer: PPO | Admitting: Cardiology

## 2021-08-04 ENCOUNTER — Telehealth: Payer: Self-pay

## 2021-08-04 ENCOUNTER — Encounter: Payer: Self-pay | Admitting: Cardiology

## 2021-08-04 ENCOUNTER — Other Ambulatory Visit: Payer: Self-pay

## 2021-08-04 VITALS — BP 188/100 | HR 74 | Ht 69.0 in | Wt 177.2 lb

## 2021-08-04 DIAGNOSIS — Z79899 Other long term (current) drug therapy: Secondary | ICD-10-CM | POA: Diagnosis not present

## 2021-08-04 DIAGNOSIS — I2510 Atherosclerotic heart disease of native coronary artery without angina pectoris: Secondary | ICD-10-CM

## 2021-08-04 DIAGNOSIS — R0789 Other chest pain: Secondary | ICD-10-CM

## 2021-08-04 DIAGNOSIS — E785 Hyperlipidemia, unspecified: Secondary | ICD-10-CM

## 2021-08-04 DIAGNOSIS — I10 Essential (primary) hypertension: Secondary | ICD-10-CM | POA: Diagnosis not present

## 2021-08-04 DIAGNOSIS — R072 Precordial pain: Secondary | ICD-10-CM | POA: Diagnosis not present

## 2021-08-04 DIAGNOSIS — Z794 Long term (current) use of insulin: Secondary | ICD-10-CM

## 2021-08-04 DIAGNOSIS — E118 Type 2 diabetes mellitus with unspecified complications: Secondary | ICD-10-CM

## 2021-08-04 MED ORDER — LOSARTAN POTASSIUM 50 MG PO TABS: 50.0000 mg | ORAL_TABLET | Freq: Every day | ORAL | 3 refills | Status: DC
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
## 2021-08-04 NOTE — Patient Instructions (Addendum)
Medication Instructions:  Your physician has recommended you make the following change in your medication:  INCREASE: Cozaar 50 mg  *If you need a refill on your cardiac medications before your next appointment, please call your pharmacy*   Lab Work: Your physician recommends that you return for lab work in:  When you go to [facility] for [place] please have labs drawn and urinalysis completed.  If you have labs (blood work) drawn today and your tests are completely normal, you will receive your results only by: [place] (if you have MyChart) OR A paper copy in the mail If you have any lab test that is abnormal or we need to change your treatment, we will call you to review the results.   Testing/Procedures: Your physician has requested that you have a lexiscan myoview. For further information please visit [email or web address]. Please follow instruction sheet, as given.   The test will take approximately 3 to 4 hours to complete; you may bring reading material.  If someone comes with you to your appointment, they will need to remain in the main lobby due to limited space in the testing area. **If you are pregnant or breastfeeding, please notify the nuclear lab prior to your appointment**  How to prepare for your Myocardial Perfusion Test: Do not eat or drink 3 hours prior to your test, except you may have water. Do not consume products containing caffeine (regular or decaffeinated) 12 hours prior to your test. (ex: coffee, chocolate, sodas, tea). Do bring a list of your current medications with you.  If not listed below, you may take your medications as normal. Do wear comfortable clothes (no dresses or overalls) and walking shoes, tennis shoes preferred (No heels or open toe shoes are allowed). Do NOT wear cologne, perfume, aftershave, or lotions (deodorant is allowed). If these instructions are not followed, your test will have to be rescheduled.    Follow-Up: At [facility], you and your health needs are our priority.  As part of our continuing mission to provide you with exceptional heart care, we have created designated Provider Care Teams.  These Care Teams include your primary Cardiologist (physician) and Advanced Practice Providers (APPs -  Physician Assistants and Nurse Practitioners) who all work together to provide you with the care you need, when you need it.  We recommend signing up for the patient portal called "MyChart".  Sign up information is provided on this After Visit Summary.  MyChart is used to connect with patients for Virtual Visits (Telemedicine).  Patients are able to view lab/test results, encounter notes, upcoming appointments, etc.  Non-urgent messages can be sent to your provider as well.   To learn more about what you can do with MyChart, go to [email or web address].    Your next appointment:   2 month(s)  The format for your next appointment:   In Person  Provider:   Minus Breeding, MD     Other Instructions

## 2021-08-05 ENCOUNTER — Other Ambulatory Visit (INDEPENDENT_AMBULATORY_CARE_PROVIDER_SITE_OTHER): Payer: Self-pay | Admitting: *Deleted

## 2021-08-05 ENCOUNTER — Encounter: Payer: Self-pay | Admitting: Cardiology

## 2021-08-05 DIAGNOSIS — K861 Other chronic pancreatitis: Secondary | ICD-10-CM | POA: Diagnosis not present

## 2021-08-05 DIAGNOSIS — D45 Polycythemia vera: Secondary | ICD-10-CM | POA: Diagnosis not present

## 2021-08-05 MED ORDER — LANSOPRAZOLE 30 MG PO CPDR
30.0000 mg | DELAYED_RELEASE_CAPSULE | Freq: Two times a day (BID) | ORAL | 2 refills | Status: DC
Start: 2021-08-05 — End: 2021-12-17

## 2021-08-06 ENCOUNTER — Telehealth: Payer: Self-pay | Admitting: Cardiology

## 2021-08-06 DIAGNOSIS — R0683 Snoring: Secondary | ICD-10-CM | POA: Diagnosis not present

## 2021-08-06 DIAGNOSIS — L299 Pruritus, unspecified: Secondary | ICD-10-CM | POA: Diagnosis not present

## 2021-08-06 DIAGNOSIS — D509 Iron deficiency anemia, unspecified: Secondary | ICD-10-CM | POA: Diagnosis not present

## 2021-08-06 DIAGNOSIS — Z7984 Long term (current) use of oral hypoglycemic drugs: Secondary | ICD-10-CM | POA: Diagnosis not present

## 2021-08-06 DIAGNOSIS — E1140 Type 2 diabetes mellitus with diabetic neuropathy, unspecified: Secondary | ICD-10-CM | POA: Diagnosis not present

## 2021-08-06 DIAGNOSIS — Z7902 Long term (current) use of antithrombotics/antiplatelets: Secondary | ICD-10-CM | POA: Diagnosis not present

## 2021-08-06 DIAGNOSIS — I7381 Erythromelalgia: Secondary | ICD-10-CM | POA: Diagnosis not present

## 2021-08-06 DIAGNOSIS — I2510 Atherosclerotic heart disease of native coronary artery without angina pectoris: Secondary | ICD-10-CM | POA: Diagnosis not present

## 2021-08-06 DIAGNOSIS — Z86010 Personal history of colonic polyps: Secondary | ICD-10-CM | POA: Diagnosis not present

## 2021-08-06 DIAGNOSIS — D45 Polycythemia vera: Secondary | ICD-10-CM | POA: Diagnosis not present

## 2021-08-06 DIAGNOSIS — I10 Essential (primary) hypertension: Secondary | ICD-10-CM | POA: Diagnosis not present

## 2021-08-06 DIAGNOSIS — Z7983 Long term (current) use of bisphosphonates: Secondary | ICD-10-CM | POA: Diagnosis not present

## 2021-08-06 DIAGNOSIS — K852 Alcohol induced acute pancreatitis without necrosis or infection: Secondary | ICD-10-CM | POA: Diagnosis not present

## 2021-08-06 DIAGNOSIS — M4802 Spinal stenosis, cervical region: Secondary | ICD-10-CM | POA: Diagnosis not present

## 2021-08-06 DIAGNOSIS — E781 Pure hyperglyceridemia: Secondary | ICD-10-CM | POA: Diagnosis not present

## 2021-08-06 DIAGNOSIS — Z79899 Other long term (current) drug therapy: Secondary | ICD-10-CM | POA: Diagnosis not present

## 2021-08-06 DIAGNOSIS — M50222 Other cervical disc displacement at C5-C6 level: Secondary | ICD-10-CM | POA: Diagnosis not present

## 2021-08-06 NOTE — Telephone Encounter (Signed)
Patient is calling about the stress test he needs to have done.   He said he needs to speak nurse.

## 2021-08-06 NOTE — Telephone Encounter (Signed)
Information given to Dr Percival Spanish to call Dr Laurance Flatten

## 2021-08-06 NOTE — Telephone Encounter (Signed)
Spoke with pt and he was calling about his upcoming stress test.  He said he has cancelled the appt because he was seen by [facility] and his hgb is over 17.  States they told him he needed to get at least 3 pints of blood off before doing the stress test.  Advised pt that Dr. Laurance Flatten has reached out to the office and is awaiting a return call from Dr. Percival Spanish.  Will route this information to Dr. Percival Spanish so he is aware.

## 2021-08-06 NOTE — Telephone Encounter (Signed)
° °  Per Daphene Jaeger, Dr. Roger Kill at [facility] would like to speak with Dr. Percival Spanish because pt's hemoglobin is at 46.6. she said to call this number [phone]

## 2021-08-06 NOTE — Telephone Encounter (Signed)
Spoke with pt and made him aware of information in addendum to Dr. Rosezella Florida note.  Advised pt to go ahead and call to reschedule stress test so that it's on the books.  Advised if we need to cancel it again, that's fine but I didn't want him to not have it scheduled and then need it and we can't get him in for a few weeks.  Pt agreeable to plan.

## 2021-08-06 NOTE — Telephone Encounter (Signed)
Dr. Percival Spanish spoke with Dr. Laurance Flatten.  See addendum and other phone note in regards to updates.

## 2021-08-11 ENCOUNTER — Encounter (HOSPITAL_COMMUNITY): Payer: PPO

## 2021-08-11 ENCOUNTER — Other Ambulatory Visit (HOSPITAL_COMMUNITY): Payer: PPO

## 2021-08-13 DIAGNOSIS — D45 Polycythemia vera: Secondary | ICD-10-CM | POA: Diagnosis not present

## 2021-08-20 ENCOUNTER — Encounter (HOSPITAL_COMMUNITY): Payer: PPO

## 2021-08-27 ENCOUNTER — Other Ambulatory Visit: Payer: PPO | Admitting: Urology

## 2021-09-15 ENCOUNTER — Other Ambulatory Visit: Payer: Self-pay | Admitting: Cardiology

## 2021-09-16 ENCOUNTER — Other Ambulatory Visit: Payer: Self-pay | Admitting: "Endocrinology

## 2021-09-16 ENCOUNTER — Other Ambulatory Visit: Payer: Self-pay | Admitting: Internal Medicine

## 2021-09-16 DIAGNOSIS — I10 Essential (primary) hypertension: Secondary | ICD-10-CM

## 2021-09-16 DIAGNOSIS — E1142 Type 2 diabetes mellitus with diabetic polyneuropathy: Secondary | ICD-10-CM

## 2021-09-17 ENCOUNTER — Telehealth: Payer: Self-pay | Admitting: "Endocrinology

## 2021-09-17 ENCOUNTER — Other Ambulatory Visit: Payer: Self-pay

## 2021-09-17 MED ORDER — DEXCOM G6 SENSOR MISC: 1 refills | Status: DC

## 2021-09-17 MED ORDER — DEXCOM G6 RECEIVER DEVI: 0 refills | Status: DC

## 2021-09-17 MED ORDER — DEXCOM G6 TRANSMITTER MISC: 1 refills | Status: DC

## 2021-09-17 MED ORDER — DEXCOM G6 SENSOR MISC
1 refills | Status: DC
Start: 2021-09-17 — End: 2021-09-17

## 2021-09-17 NOTE — Telephone Encounter (Signed)
Scanned in chart.

## 2021-09-17 NOTE — Telephone Encounter (Signed)
Supplies has been sent electronically to [facility] and I will call [employer] and cancel the first order.

## 2021-09-17 NOTE — Telephone Encounter (Signed)
[facility] and left a detailed voice message to cancel all of the Dexcom G6 prescriptions sent to them.

## 2021-09-17 NOTE — Telephone Encounter (Signed)
Pt is emailing me the cards. I will give to you once I get them and can print them

## 2021-09-17 NOTE — Telephone Encounter (Signed)
Patient said he now has [place] will be covered by this. I asked him to bring the insurance card by. He said that he will. He has checked on diabetic shoes as well & needs a nurse to call him about this. Thank you   He uses [facility]

## 2021-09-17 NOTE — Telephone Encounter (Signed)
Pt said the number to [facility] is [phone] and the fax number is [phone]

## 2021-09-17 NOTE — Telephone Encounter (Signed)
I have sent a new prescription for the Dexcom G6 supplies to [facility] per patient request.  I let the patient know that he will need to see a podiatrist to have a foot exam to see if he qualifies for diabetic shoes.  Patient verbalized an understanding.

## 2021-09-22 ENCOUNTER — Other Ambulatory Visit (HOSPITAL_COMMUNITY)
Admission: RE | Admit: 2021-09-22 | Discharge: 2021-09-22 | Disposition: A | Discharge status: Home / Self Care | Payer: Medicare Other | Source: Ambulatory Visit | Attending: Cardiology | Admitting: Cardiology

## 2021-09-22 ENCOUNTER — Ambulatory Visit (HOSPITAL_COMMUNITY)
Admission: RE | Admit: 2021-09-22 | Discharge: 2021-09-22 | Disposition: A | Discharge status: Home / Self Care | Payer: Medicare Other | Source: Ambulatory Visit | Attending: Cardiology | Admitting: Cardiology

## 2021-09-22 ENCOUNTER — Other Ambulatory Visit: Payer: Self-pay

## 2021-09-22 ENCOUNTER — Encounter (HOSPITAL_COMMUNITY): Payer: Self-pay

## 2021-09-22 DIAGNOSIS — Z794 Long term (current) use of insulin: Secondary | ICD-10-CM | POA: Insufficient documentation

## 2021-09-22 DIAGNOSIS — R0789 Other chest pain: Secondary | ICD-10-CM | POA: Insufficient documentation

## 2021-09-22 DIAGNOSIS — I10 Essential (primary) hypertension: Secondary | ICD-10-CM | POA: Insufficient documentation

## 2021-09-22 DIAGNOSIS — Z79899 Other long term (current) drug therapy: Secondary | ICD-10-CM | POA: Insufficient documentation

## 2021-09-22 DIAGNOSIS — I2510 Atherosclerotic heart disease of native coronary artery without angina pectoris: Secondary | ICD-10-CM | POA: Insufficient documentation

## 2021-09-22 DIAGNOSIS — E118 Type 2 diabetes mellitus with unspecified complications: Secondary | ICD-10-CM | POA: Insufficient documentation

## 2021-09-22 DIAGNOSIS — E785 Hyperlipidemia, unspecified: Secondary | ICD-10-CM | POA: Insufficient documentation

## 2021-09-22 HISTORY — DX: Essential (primary) hypertension: I10

## 2021-09-22 HISTORY — DX: Malignant (primary) neoplasm, unspecified: C80.1

## 2021-09-22 LAB — NM MYOCAR MULTI W/SPECT W/WALL MOTION / EF
LV dias vol: 43 mL (ref 62–150)
LV sys vol: 10 mL
Nuc Stress EF: 77 %
Peak HR: 73 {beats}/min
RATE: 0.4
Rest HR: 58 {beats}/min
Rest Nuclear Isotope Dose: 11 mCi
SDS: 3
SRS: 1
SSS: 4
ST Depression (mm): 0 mm
Stress Nuclear Isotope Dose: 29 mCi
TID: 0.76

## 2021-09-22 MED ORDER — TECHNETIUM TC 99M TETROFOSMIN IV KIT
30.0000 | PACK | Freq: Once | INTRAVENOUS | Status: AC | PRN
  Administered 2021-09-22: 29 via INTRAVENOUS

## 2021-09-22 MED ORDER — SODIUM CHLORIDE FLUSH 0.9 % IV SOLN
INTRAVENOUS | Status: AC
  Administered 2021-09-22: 10 mL via INTRAVENOUS
  Filled 2021-09-22: qty 10

## 2021-09-22 MED ORDER — TECHNETIUM TC 99M TETROFOSMIN IV KIT
10.0000 | PACK | Freq: Once | INTRAVENOUS | Status: AC | PRN
  Administered 2021-09-22: 10:00:00 11 via INTRAVENOUS

## 2021-09-22 MED ORDER — REGADENOSON 0.4 MG/5ML IV SOLN
INTRAVENOUS | Status: AC
  Administered 2021-09-22: 0.4 mg via INTRAVENOUS
  Filled 2021-09-22: qty 5

## 2021-09-29 ENCOUNTER — Encounter: Payer: Self-pay | Admitting: "Endocrinology

## 2021-09-29 ENCOUNTER — Other Ambulatory Visit: Payer: Self-pay

## 2021-09-29 ENCOUNTER — Other Ambulatory Visit (HOSPITAL_COMMUNITY)
Admission: RE | Admit: 2021-09-29 | Discharge: 2021-09-29 | Disposition: A | Discharge status: Home / Self Care | Payer: Medicare Other | Source: Ambulatory Visit | Attending: Cardiology | Admitting: Cardiology

## 2021-09-29 DIAGNOSIS — E1151 Type 2 diabetes mellitus with diabetic peripheral angiopathy without gangrene: Secondary | ICD-10-CM | POA: Insufficient documentation

## 2021-09-29 DIAGNOSIS — Z794 Long term (current) use of insulin: Secondary | ICD-10-CM | POA: Diagnosis not present

## 2021-09-29 DIAGNOSIS — E559 Vitamin D deficiency, unspecified: Secondary | ICD-10-CM | POA: Insufficient documentation

## 2021-09-29 DIAGNOSIS — Z79899 Other long term (current) drug therapy: Secondary | ICD-10-CM | POA: Diagnosis not present

## 2021-09-29 DIAGNOSIS — I2510 Atherosclerotic heart disease of native coronary artery without angina pectoris: Secondary | ICD-10-CM | POA: Insufficient documentation

## 2021-09-29 DIAGNOSIS — Z125 Encounter for screening for malignant neoplasm of prostate: Secondary | ICD-10-CM | POA: Diagnosis not present

## 2021-09-29 DIAGNOSIS — I10 Essential (primary) hypertension: Secondary | ICD-10-CM | POA: Insufficient documentation

## 2021-09-29 DIAGNOSIS — R0789 Other chest pain: Secondary | ICD-10-CM | POA: Insufficient documentation

## 2021-09-29 DIAGNOSIS — E785 Hyperlipidemia, unspecified: Secondary | ICD-10-CM | POA: Diagnosis not present

## 2021-09-29 LAB — CBC WITH DIFFERENTIAL/PLATELET
Abs Immature Granulocytes: 0.02 10*3/uL (ref 0.00–0.07)
Basophils Absolute: 0 10*3/uL (ref 0.0–0.1)
Basophils Relative: 1 %
Eosinophils Absolute: 0.2 10*3/uL (ref 0.0–0.5)
Eosinophils Relative: 3 %
HCT: 47.9 % (ref 39.0–52.0)
Hemoglobin: 16.5 g/dL (ref 13.0–17.0)
Immature Granulocytes: 0 %
Lymphocytes Relative: 25 %
Lymphs Abs: 1.3 10*3/uL (ref 0.7–4.0)
MCH: 29.5 pg (ref 26.0–34.0)
MCHC: 34.4 g/dL (ref 30.0–36.0)
MCV: 85.7 fL (ref 80.0–100.0)
Monocytes Absolute: 0.4 10*3/uL (ref 0.1–1.0)
Monocytes Relative: 8 %
Neutro Abs: 3.2 10*3/uL (ref 1.7–7.7)
Neutrophils Relative %: 63 %
Platelets: 250 10*3/uL (ref 150–400)
RBC: 5.59 MIL/uL (ref 4.22–5.81)
RDW: 14.7 % (ref 11.5–15.5)
WBC: 5.1 10*3/uL (ref 4.0–10.5)
nRBC: 0 % (ref 0.0–0.2)

## 2021-09-29 LAB — COMPREHENSIVE METABOLIC PANEL
ALT: 15 U/L (ref 0–44)
AST: 42 U/L — ABNORMAL HIGH (ref 15–41)
Albumin: 3.7 g/dL (ref 3.5–5.0)
Alkaline Phosphatase: 54 U/L (ref 38–126)
Anion gap: 13 (ref 5–15)
BUN: 26 mg/dL — ABNORMAL HIGH (ref 6–20)
CO2: 22 mmol/L (ref 22–32)
Calcium: 9.5 mg/dL (ref 8.9–10.3)
Chloride: 100 mmol/L (ref 98–111)
Creatinine, Ser: 1.55 mg/dL — ABNORMAL HIGH (ref 0.61–1.24)
GFR, Estimated: 51 mL/min — ABNORMAL LOW (ref >60)
Glucose, Bld: 258 mg/dL — ABNORMAL HIGH (ref 70–99)
Potassium: 4.8 mmol/L (ref 3.5–5.1)
Sodium: 135 mmol/L (ref 135–145)
Total Bilirubin: 1.9 mg/dL — ABNORMAL HIGH (ref 0.3–1.2)
Total Protein: 6.4 g/dL — ABNORMAL LOW (ref 6.5–8.1)

## 2021-09-29 LAB — PSA: Prostatic Specific Antigen: 1.35 ng/mL (ref 0.00–4.00)

## 2021-09-29 LAB — URINALYSIS, COMPLETE (UACMP) WITH MICROSCOPIC
Bilirubin Urine: NEGATIVE
Glucose, UA: NEGATIVE mg/dL
Hgb urine dipstick: NEGATIVE
Ketones, ur: NEGATIVE mg/dL
Leukocytes,Ua: NEGATIVE
Nitrite: NEGATIVE
Protein, ur: NEGATIVE mg/dL
Specific Gravity, Urine: 1.016 (ref 1.005–1.030)
pH: 5 (ref 5.0–8.0)

## 2021-09-29 LAB — VITAMIN D 25 HYDROXY (VIT D DEFICIENCY, FRACTURES): Vit D, 25-Hydroxy: 10.43 ng/mL — ABNORMAL LOW (ref 30–100)

## 2021-09-29 LAB — LIPID PANEL
Cholesterol: 563 mg/dL — ABNORMAL HIGH (ref 0–200)
Triglycerides: 4597 mg/dL — ABNORMAL HIGH (ref <150)
VLDL: UNDETERMINED mg/dL (ref 0–40)

## 2021-09-29 LAB — HEMOGLOBIN A1C
Hgb A1c MFr Bld: 8.6 % — ABNORMAL HIGH (ref 4.8–5.6)
Mean Plasma Glucose: 200.12 mg/dL

## 2021-09-29 LAB — TSH: TSH: 3.727 u[IU]/mL (ref 0.350–4.500)

## 2021-09-29 MED ORDER — DEXCOM G6 TRANSMITTER MISC: 1 refills | Status: DC

## 2021-09-29 MED ORDER — DEXCOM G6 SENSOR MISC: 1 refills | Status: DC

## 2021-09-29 MED ORDER — DEXCOM G6 RECEIVER DEVI: 0 refills | Status: DC

## 2021-09-29 NOTE — Telephone Encounter (Signed)
Called the patient and left a detailed voice message for patient to call back.

## 2021-09-29 NOTE — Telephone Encounter (Signed)
Patient called and stated that he spoke with someone in [place] and I let him know that I was sending his Dexcom supplies to [facility] in [place]. Patient verbalized an understanding and will let us now if he does not hear back from them.

## 2021-09-29 NOTE — Telephone Encounter (Signed)
[name], this patient is asking you to please give him a call in regards to his Dexcom G6 system.

## 2021-10-01 ENCOUNTER — Encounter: Payer: Self-pay | Admitting: Cardiology

## 2021-10-01 ENCOUNTER — Telehealth: Payer: Self-pay | Admitting: *Deleted

## 2021-10-01 DIAGNOSIS — E785 Hyperlipidemia, unspecified: Secondary | ICD-10-CM

## 2021-10-01 MED ORDER — ICOSAPENT ETHYL 1 G PO CAPS: 2.0000 g | ORAL_CAPSULE | Freq: Two times a day (BID) | ORAL | 3 refills | Status: DC

## 2021-10-01 MED ORDER — FENOFIBRATE 67 MG PO CAPS: 67.0000 mg | ORAL_CAPSULE | Freq: Every day | ORAL | 4 refills | Status: DC

## 2021-10-01 NOTE — Telephone Encounter (Signed)
Left message for pt to call  Patient will need to stop lovaza and change to fenofibrate

## 2021-10-01 NOTE — Telephone Encounter (Signed)
-----   Message from Minus Breeding, MD sent at 09/30/2021  1:05 PM EST ----- Very elevated lipids with triglycerides at 4597.    Please make sure that he is following a very low fat diet.  He needs to be on Vascepa 2gm BID.  We could consider him for the CORE study in a couple of months if his lipids are not controlled.  He also needs to start fenofibrate  67 mg once daily.    Check a BMET in one month please.  Repeat fasting lipids in two months.

## 2021-10-01 NOTE — Telephone Encounter (Signed)
Spoke with pt, aware of dr hochrein's recommendations. New script sent to the pharmacy  Lab orders mailed to the pt  Diet information also mailed to the patient

## 2021-10-02 ENCOUNTER — Telehealth: Payer: Self-pay

## 2021-10-02 ENCOUNTER — Other Ambulatory Visit: Payer: Self-pay | Admitting: Internal Medicine

## 2021-10-02 ENCOUNTER — Other Ambulatory Visit: Payer: Self-pay

## 2021-10-02 DIAGNOSIS — E782 Mixed hyperlipidemia: Secondary | ICD-10-CM

## 2021-10-02 NOTE — Telephone Encounter (Signed)
Patient called in, stating that the Vascepa was sent in as generic for him. He states that generic medications do not work for him- he would like to do the name brand. However, he states that the name brand is $400 for a 90 day. He states he is trying to get serious about his cholesterol. He states his daughter gets assistance from other companies for her medications, and this is something he would like to do.   He wants brand name- but can't afford to do $400.   He would like to know what Dr.Hochrein recommends to do.   Thanks!

## 2021-10-05 NOTE — Telephone Encounter (Signed)
Spoke with pt, aware will send him the application for the assistance program.

## 2021-10-08 NOTE — Telephone Encounter (Signed)
Patient has Medicare so can not use Vascepa copay card. Recommend he at least try the generic for a few months until recheck to see if it lowers his triglycerides.

## 2021-10-09 DIAGNOSIS — E1140 Type 2 diabetes mellitus with diabetic neuropathy, unspecified: Secondary | ICD-10-CM | POA: Diagnosis not present

## 2021-10-13 ENCOUNTER — Ambulatory Visit (INDEPENDENT_AMBULATORY_CARE_PROVIDER_SITE_OTHER): Payer: Medicare Other | Admitting: Internal Medicine

## 2021-10-13 ENCOUNTER — Telehealth (INDEPENDENT_AMBULATORY_CARE_PROVIDER_SITE_OTHER): Payer: Self-pay

## 2021-10-13 ENCOUNTER — Telehealth: Payer: Self-pay | Admitting: *Deleted

## 2021-10-13 ENCOUNTER — Other Ambulatory Visit (INDEPENDENT_AMBULATORY_CARE_PROVIDER_SITE_OTHER): Payer: Self-pay

## 2021-10-13 ENCOUNTER — Other Ambulatory Visit: Payer: Self-pay

## 2021-10-13 ENCOUNTER — Encounter (INDEPENDENT_AMBULATORY_CARE_PROVIDER_SITE_OTHER): Payer: Self-pay

## 2021-10-13 ENCOUNTER — Encounter (INDEPENDENT_AMBULATORY_CARE_PROVIDER_SITE_OTHER): Payer: Self-pay | Admitting: Internal Medicine

## 2021-10-13 VITALS — BP 128/90 | HR 71 | Temp 97.9°F | Ht 69.0 in | Wt 175.9 lb

## 2021-10-13 DIAGNOSIS — K861 Other chronic pancreatitis: Secondary | ICD-10-CM | POA: Diagnosis not present

## 2021-10-13 DIAGNOSIS — K3184 Gastroparesis: Secondary | ICD-10-CM

## 2021-10-13 DIAGNOSIS — K2270 Barrett's esophagus without dysplasia: Secondary | ICD-10-CM | POA: Diagnosis not present

## 2021-10-13 DIAGNOSIS — K219 Gastro-esophageal reflux disease without esophagitis: Secondary | ICD-10-CM

## 2021-10-13 MED ORDER — CLENPIQ 10-3.5-12 MG-GM -GM/160ML PO SOLN: 1.0000 | Freq: Once | ORAL | 0 refills | Status: AC

## 2021-10-13 NOTE — Telephone Encounter (Signed)
Cung Masterson Ann Chevy Virgo, CMA  ?

## 2021-10-13 NOTE — Telephone Encounter (Signed)
° °  Pre-operative Risk Assessment    Patient Name: [name]  DOB: 1960-11-08 MRN: [id number]      Request for Surgical Clearance    Procedure:   COLONOSCOPY & ENDOSCOPY  Date of Surgery:  Clearance 11/11/21                                 Surgeon:  Hildred Laser, MD Surgeon's Group or Practice Name:  [facility] Phone number:  [phone] Fax number:  [phone]   Type of Clearance Requested:   - Pharmacy:  Hold Clopidogrel (Plavix) X'S 5 DAYS   Type of Anesthesia:  Not Indicated   Additional requests/questions:    Astrid Divine   10/13/2021, 4:23 PM

## 2021-10-13 NOTE — Progress Notes (Signed)
Presenting complaint;  Upper abdominal pain.   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 History of GERD.  Database and subjective:  Patient is a 61 year old Caucasian male who is here for scheduled visit. He has chronic GERD complicated by short segment Barrett's esophagus, gastroparesis history of complicated pancreatitis secondary to familial hypertriglyceridemia.  He was last seen in our office on 01/27/2020 by Ms. Laurine Blazer, PA-C. Patient states he had upper abdominal pain last week and believes he had a mild episode of pancreatitis.  He had lab studies done on 09/29/2021.  His hemoglobin A1c was 8.6.  Vitamin D2 level was low. Cholesterol was 563 and triglycerides is 4597.  AST was 45 with ALT of 15 and bilirubin was 1.9.  AP was 54. He was begun on this LTL fenofibrate by Dr. [name].  He is scheduled to have blood work repeated next month.  He says he has become very strict with his diet. He says heartburn generally is well controlled with therapy he may have an episode no more than once a week.  He is using lansoprazole on as-needed basis.  He takes domperidone twice a day.  He denies dysphagia nausea or vomiting.  He used to be constipated and now he has 2-3 bowel movements on some days.  He has not lost any weight since his last visit of June 2021.  He also complains of back pain as well as pain in his extremities.  He has been diagnosed with peripheral neuropathy.  He takes pain pill usually half a tablet twice a day.  He says he has seen for cancer physicians.  He has all the hallmarks of polycythemia vera.  Apparently some of the test were inconclusive.  He is undergoing phlebotomy every 90 days.  Last phlebotomy was in December 2022.  He is being followed at the hematology clinic at [facility] in [facility].   Current Medications: Outpatient Encounter Medications as of 10/13/2021  Medication Sig   Cholecalciferol 25 MCG (1000 UT) tablet Take  by mouth daily. Takes 10,000 units daily   clopidogrel (PLAVIX) 75 MG tablet TAKE ONE TABLET BY MOUTH ONCE DAILY AS A BLOOD THINNER.   Continuous Blood Gluc Sensor (FREESTYLE LIBRE 2 SENSOR) MISC USE TO CHECK BLOOD SUGAR AS DIRECTED.   diazepam (VALIUM) 10 MG tablet Take 10 mg by mouth at bedtime as needed for anxiety or sleep. As Needed   ezetimibe (ZETIA) 10 MG tablet TAKE ONE TABLET BY MOUTH ONCE DAILY.   fenofibrate micronized (LOFIBRA) 67 MG capsule Take 1 capsule (67 mg total) by mouth daily before breakfast.   gabapentin (NEURONTIN) 400 MG capsule TAKE (1) CAPSULE BY MOUTH TWICE DAILY.   glucose blood (FREESTYLE TEST STRIPS) test strip Use as instructed   hydrochlorothiazide (HYDRODIURIL) 25 MG tablet TAKE (1) TABLET BY MOUTH TWICE DAILY FOR HIGH BLOOD PRESSURE.   HYDROcodone-acetaminophen (NORCO) 10-325 MG tablet TAKE (1) TABLET BY MOUTH FOUR TIMES DAILY FOR SEVERE PAIN.   icosapent Ethyl (VASCEPA) 1 g capsule Take 2 capsules (2 g total) by mouth 2 (two) times daily.   insulin NPH-regular Human (NOVOLIN 70/30) (70-30) 100 UNIT/ML injection Inject 48 Units into the skin 2 (two) times daily before a meal. When glucose is above 90 and eating   lansoprazole (PREVACID) 30 MG capsule Take 1 capsule (30 mg total) by mouth 2 (two) times daily before a meal.   losartan (COZAAR) 50 MG tablet Take 1 tablet (50 mg total) by mouth daily.   metFORMIN (  GLUCOPHAGE-XR) 500 MG 24 hr tablet TAKE 1 TABLET BY MOUTH ONCE DAILY WITH BREAKFAST.   methocarbamol (ROBAXIN) 500 MG tablet Take 1 tablet (500 mg total) by mouth every 8 (eight) hours as needed for muscle spasms.   metoprolol tartrate (LOPRESSOR) 25 MG tablet TAKE 1 TABLET BY MOUTH TWICE A DAY.   nitroGLYCERIN (NITROSTAT) 0.4 MG SL tablet Place 0.4 mg under the tongue every 5 (five) minutes as needed for chest pain. Reported on 02/26/2016   NON FORMULARY Domperidone 10 mg Patient takes 2 by mouth daily   NONFORMULARY OR COMPOUNDED ITEM Compounded cream for  foot pain. Gets at [employer].   ondansetron (ZOFRAN) 4 MG tablet Take 4 mg by mouth every 8 (eight) hours as needed for nausea or vomiting.   Pancrelipase, Lip-Prot-Amyl, 6000 units CPEP Take by mouth as needed.    PARoxetine (PAXIL) 10 MG tablet TAKE ONE TABLET BY MOUTH ONCE DAILY FOR DEPRESSION.   polyethylene glycol (MIRALAX / GLYCOLAX) 17 g packet Take 17 g by mouth daily. 1 capful daily as needed   promethazine (PHENERGAN) 25 MG tablet Take 25 mg by mouth every 8 (eight) hours as needed for nausea or vomiting.   tamsulosin (FLOMAX) 0.4 MG CAPS capsule Take 1 capsule (0.4 mg total) by mouth daily.   Continuous Blood Gluc Receiver (DEXCOM G6 RECEIVER) DEVI Use to check blood glucose 4 times daily. (Patient not taking: Reported on 10/13/2021)   Continuous Blood Gluc Sensor (DEXCOM G6 SENSOR) MISC Use 1 sensor every 10 days. (Patient not taking: Reported on 10/13/2021)   Continuous Blood Gluc Transmit (DEXCOM G6 TRANSMITTER) MISC Use 1 transmitter every 90 days. (Patient not taking: Reported on 10/13/2021)   [DISCONTINUED] levofloxacin (LEVAQUIN) 500 MG tablet TAKE ONE TABLET BY MOUTH ONCE DAILY FOR7 DAYS.   [DISCONTINUED] tadalafil (CIALIS) 5 MG tablet Take 1 tablet (5 mg total) by mouth daily as needed for erectile dysfunction. (Patient not taking: Reported on 08/04/2021)   No facility-administered encounter medications on file as of 10/13/2021.     Objective: Blood pressure 128/90, pulse 71, temperature 97.9 F (36.6 C), temperature source Oral, height $RemoveBefo'5\' 9"'PvzjdeGSjTv$  (1.753 m), weight 175 lb 14.4 oz (79.8 kg). Patient is alert and in no acute distress. Conjunctiva is pink. Sclera is nonicteric Oropharyngeal mucosa is normal. No neck masses or thyromegaly noted. Cardiac exam with regular rhythm normal S1 and S2. No murmur or gallop noted. Lungs are clear to auscultation. Abdomen is symmetrical.  He has lower midline scar.  On palpation abdomen is soft.  He has mild tenderness in epigastric  region and below the left costal margin.  No organomegaly or masses. No LE edema or clubbing noted.  Labs/studies Results:   CBC Latest Ref Rng & Units 09/29/2021 01/14/2021 06/27/2020  WBC 4.0 - 10.5 K/uL 5.1 5.5 6.6  Hemoglobin 13.0 - 17.0 g/dL 16.5 14.0 13.6  Hematocrit 39.0 - 52.0 % 47.9 45.2 42.8  Platelets 150 - 400 K/uL 250 232 297    CMP Latest Ref Rng & Units 09/29/2021 01/14/2021 06/27/2020  Glucose 70 - 99 mg/dL 258(H) 171(H) 143(H)  BUN 6 - 20 mg/dL 26(H) 20 21  Creatinine 0.61 - 1.24 mg/dL 1.55(H) 1.26 1.44(H)  Sodium 135 - 145 mmol/L 135 137 135  Potassium 3.5 - 5.1 mmol/L 4.8 4.2 4.6  Chloride 98 - 111 mmol/L 100 98 96  CO2 22 - 32 mmol/L $RemoveB'22 21 25  'BujNIowE$ Calcium 8.9 - 10.3 mg/dL 9.5 9.4 9.6  Total Protein 6.5 - 8.1 g/dL  6.4(L) 6.6 7.1  Total Bilirubin 0.3 - 1.2 mg/dL 1.9(H) 0.5 0.6  Alkaline Phos 38 - 126 U/L 54 55 56  AST 15 - 41 U/L 42(H) 23 26  ALT 0 - 44 U/L $Remo'15 17 15    'rLqGz$ Hepatic Function Latest Ref Rng & Units 09/29/2021 01/14/2021 06/27/2020  Total Protein 6.5 - 8.1 g/dL 6.4(L) 6.6 7.1  Albumin 3.5 - 5.0 g/dL 3.7 4.2 4.6  AST 15 - 41 U/L 42(H) 23 26  ALT 0 - 44 U/L $Remo'15 17 15  'jzVDk$ Alk Phosphatase 38 - 126 U/L 54 55 56  Total Bilirubin 0.3 - 1.2 mg/dL 1.9(H) 0.5 0.6  Bilirubin, Direct <=0.2 mg/dL - - -    Lab Results  Component Value Date   CRP 1 05/19/2020    Cholesterol and triglyceride levels as above.  Assessment:  #1.  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  Triglyceride level is over 4500.  Based on his symptoms he may have a mild episode last week.  He must get his triglyceride level down again before he has another bad episode of pancreatitis.   . #2.  Chronic GERD complicated by short segment Barrett's esophagus and gastroparesis.  He is taking lansoprazole on as-needed basis.  It remains to be seen whether or not this is the right decision.  Until the time of EGD he will continue to use it on as-needed basis.  #3.  Change in bowel habits.  He  used to be constipated but now on some days he is having as many as 3 stools.  He has been using OTC pancreatic enzyme supplement but he has never been diagnosed with pancreatic insufficiency.  He certainly is at risk.  #4.  History of colonic adenoma.  He has tubular adenoma removed from his sigmoid colon in March 11, 2016.  #5.  Familial triglyceridemia.  He needs to be compliant with diet and therapy to minimize risk of another episode of pancreatitis.  Sadly he lost his younger brother due to complications of pancreatitis secondary to familial triglyceridemia.   Plan:  Patient advised to stop OTC pancreatic enzyme supplement. Fecal elastase to be collected after 1 week. We will schedule patient for surveillance esophagogastroduodenoscopy and colonoscopy in near future. Office visit date to be determined.

## 2021-10-13 NOTE — Patient Instructions (Signed)
Physician will call with results of stool/fecal elastase Esophagogastroduodenoscopy and colonoscopy to be scheduled within the next 3 to 4 weeks. Please remember you will need to be off clopidogrel for 5 days.

## 2021-10-14 ENCOUNTER — Encounter (INDEPENDENT_AMBULATORY_CARE_PROVIDER_SITE_OTHER): Payer: Self-pay

## 2021-10-14 NOTE — Telephone Encounter (Signed)
° °  Name: [name]  DOB: Dec 22, 1960  MRN: [id number]   Primary Cardiologist: Minus Breeding, MD  Chart reviewed as part of pre-operative protocol coverage. Patient was contacted 10/14/2021 in reference to pre-operative risk assessment for pending surgery as outlined below.  GIROLAMO LORTIE was last seen on 07/2021 by Dr. Percival Spanish, history reviewed, hx of CAD with remote CABG 2006 per notes. At last OV, his blood pressure was elevated and he was having episodic chest pain. Stress test was ordered. Dr. Percival Spanish spoke with Dr. Laurance Flatten at [facility] at which time it was felt that he could possibly be having chest related to his noncompliance with phlebotomy given Hgb 17.6. I spoke with the patient who states he has since undergone phlebotomy several times and reports his symptoms completely resolved and he feels fine at this time. Blood pressure has significantly improved. Nuclear stress test was ultimately performed 09/22/21 and was normal.   Medically he is cleared for upcoming low risk colonoscopy/endoscopy but given recent events will route to Dr. Percival Spanish to ensure he is OK to hold his Plavix for 5 days as requested. (On Plavix monotherapy.) Dr. Percival Spanish - Please route response to P CV DIV PREOP (the pre-op pool). Thank you.   Charlie Pitter, PA-C 10/14/2021, 8:50 AM

## 2021-10-15 ENCOUNTER — Other Ambulatory Visit: Payer: PPO | Admitting: Urology

## 2021-10-15 ENCOUNTER — Ambulatory Visit: Payer: Medicare Other | Admitting: Urology

## 2021-10-15 ENCOUNTER — Other Ambulatory Visit: Payer: Self-pay

## 2021-10-15 VITALS — BP 144/92 | HR 86 | Ht 69.0 in | Wt 176.0 lb

## 2021-10-15 DIAGNOSIS — N138 Other obstructive and reflux uropathy: Secondary | ICD-10-CM

## 2021-10-15 DIAGNOSIS — R39198 Other difficulties with micturition: Secondary | ICD-10-CM | POA: Diagnosis not present

## 2021-10-15 DIAGNOSIS — N411 Chronic prostatitis: Secondary | ICD-10-CM

## 2021-10-15 DIAGNOSIS — N5201 Erectile dysfunction due to arterial insufficiency: Secondary | ICD-10-CM | POA: Diagnosis not present

## 2021-10-15 DIAGNOSIS — N401 Enlarged prostate with lower urinary tract symptoms: Secondary | ICD-10-CM

## 2021-10-15 LAB — URINALYSIS, ROUTINE W REFLEX MICROSCOPIC
Bilirubin, UA: NEGATIVE
Glucose, UA: NEGATIVE
Ketones, UA: NEGATIVE
Leukocytes,UA: NEGATIVE
Nitrite, UA: NEGATIVE
RBC, UA: NEGATIVE
Specific Gravity, UA: 1.025 (ref 1.005–1.030)
Urobilinogen, Ur: 0.2 mg/dL (ref 0.2–1.0)
pH, UA: 5.5 (ref 5.0–7.5)

## 2021-10-15 LAB — BLADDER SCAN AMB NON-IMAGING: Scan Result: 7

## 2021-10-15 MED ORDER — ALFUZOSIN HCL ER 10 MG PO TB24: 10.0000 mg | ORAL_TABLET | Freq: Every day | ORAL | 11 refills | Status: DC

## 2021-10-15 MED ORDER — CIPROFLOXACIN HCL 500 MG PO TABS
500.0000 mg | ORAL_TABLET | Freq: Once | ORAL | Status: AC
  Administered 2021-10-15: 500 mg via ORAL

## 2021-10-15 NOTE — Progress Notes (Signed)
Uroflow  Peak Flow: 20ml Average Flow: 66ml Voided Volume: 37ml Voiding Time: 13sec Flow Time: 6sec Time to Peak Flow: 1sec  PVR Volume: 38ml  Uroflow  Peak Flow: 32ml Average Flow: 83ml Voided Volume: 134ml Voiding Time: 31sec Flow Time: 9sec Time to Peak Flow: 9sec  Post Cysto

## 2021-10-15 NOTE — Progress Notes (Signed)
Subjective: 1. BPH with urinary obstruction   2. Slowing, urinary stream   3. Chronic prostatitis   4. Erectile dysfunction due to arterial insufficiency      10/15/21: [name] returns today in f/u for voiding studies and cystoscopy.  His PVR is 64ml.  He didn't have an adequate volume for a FR.   He was given daily tadalafil but he didn't take it compliantly as he didn't realize that he was to take for his LUTS.   He remains on tamsulosin.    GU Hx: [name] is a 61 yo male who is sent by Dr. [name] for voiding symptoms.  His last PSA was 1.9 on 06/27/20.  He had a UA and culture on 03/26/21 that were negative.  He was given Levofloxin for the recent flair and finished it 3 days ago after a 2-3 week course.  He remains symptomatic and has left testicular pain.   He has seen Dr. [name] in the past for similar issues.   He was treated with levaquin for chronic prostatitis.  He returns now with a flair that began in July with the last episode a year ago.   It happened after he got dehydrated putting up his pool this year.  He had urgency with frequency and small voids.  He didn't feel he was emptying his bladder.   His on tamsulosin but has been variable with compliance because of some BP issue.  He has no history of stones or GU surgery.   He is a diabetic following pancreatitis in 2001 impacting both the endocrine and exocrine function and has peripheral neuropathy.  He has has polycythemia and has to give blood on occasion.  He has ED and is on sildenafil 50mg  which isn't quite strong enough.  He had a CT stone study in 2020 and the prostate was moderately enlarged with a 4.8cm transverse diameter. His bladder was not distended and he had normal kidneys.   ROS:  ROS  Allergies  Allergen Reactions   Iodinated Contrast Media Hives, Itching and Other (See Comments)    Rash, nausea and flushing Other reaction(s): Other Burning, itching Burning, itching Other reaction(s): Angioedema  (ALLERGY/intolerance)    Metoclopramide Hcl Hives    Patient became very spastic   Propofol Other (See Comments)    Other reaction(s): Other (See Comments) Pancreatic attack. Other reaction(s): Other (See Comments) Pancreatic attack. Pancreatic attack.    Iohexol      Desc: HIVES,SOB NEEDS PREP.MEDS    Propofol Other (See Comments)    Pancreatic attack.   Sulfonamide Derivatives     Large hives   Enalapril Cough and Other (See Comments)   Penicillins Rash    Has patient had a PCN reaction causing immediate rash, facial/tongue/throat swelling, SOB or lightheadedness with hypotension: Yes Has patient had a PCN reaction causing severe rash involving mucus membranes or skin necrosis: No Has patient had a PCN reaction that required hospitalization No Has patient had a PCN reaction occurring within the last 10 years: No. If all of the above answers are "NO", then may proceed with Cephalosporin use.    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  Patent coronary bypass grafts with a free radial graft to PDA and LIMA to the  diag.   Diabetes mellitus    Diabetic peripheral neuropathy ([place]) 05/06/2020   Dyslipidemia    Poorly controlled, probably related to his DM (Some of this is secondary to his inability to afford medications).   ED (erectile dysfunction)    Esophageal yeast infection ([place])    Hypertension    Metaplasia of esophagus    Pancreatitis chronic    Pseudocyst   Polycythemia vera(238.4) 04/26/2012    Past Surgical History:  Procedure Laterality Date   APPENDECTOMY     CARDIAC CATHETERIZATION     CHOLECYSTECTOMY     COLONOSCOPY  07/31/1985   COLONOSCOPY N/A 03/11/2016   Procedure: COLONOSCOPY;   Surgeon: Rogene Houston, MD;  Location: [facility];  Service: Endoscopy;  Laterality: N/A;   CORONARY ANGIOPLASTY WITH STENT PLACEMENT  2006   CORONARY ARTERY BYPASS GRAFT  11/24/2004   ESOPHAGOGASTRODUODENOSCOPY  09/19/01   ESOPHAGOGASTRODUODENOSCOPY N/A 03/11/2016   Procedure: ESOPHAGOGASTRODUODENOSCOPY (EGD);  Surgeon: Rogene Houston, MD;  Location: [facility];  Service: Endoscopy;  Laterality: N/A;  12:00   FINGER TENDON REPAIR     in middle finger   KNEE SURGERY     left knee   NASAL SEPTUM SURGERY      Social History   Socioeconomic History   Marital status: Married    Spouse name: Not on file   Number of children: Not on file   Years of education: Not on file   Highest education level: Not on file  Occupational History   Not on file  Tobacco Use   Smoking status: Never   Smokeless tobacco: Never  Substance and Sexual Activity   Alcohol use: Yes    Comment: Patient may drink a beer or glass of wine a couple times a month   Drug use: No   Sexual activity: Not on file  Other Topics Concern   Not on file  Social History Narrative   The patient lives in [place] with his wife.  He is     disabled secondary to coronary artery disease and pancreatitis.  He does     not smoke, he does not drink alcohol.  There is no drug use.    Social Determinants of Health   Financial Resource Strain: Not on file  Food Insecurity: Not on file  Transportation Needs: Not on file  Physical Activity: Not on file  Stress: Not on file  Social Connections: Not on file  Intimate Partner Violence: Not on file    Family History  Problem Relation Age of Onset   Coronary artery disease Mother        strong family history of   Heart attack Mother        dying of (at age 29)   Diabetes Mother    Diabetes Sister    Diabetes Brother    Pancreatitis Brother    Healthy Daughter    Hyperlipidemia Daughter    Heart disease Brother    Hypertension Brother    Hyperlipidemia Brother      Anti-infectives: Anti-infectives (From admission, onward)    Start     Dose/Rate Route Frequency Ordered Stop   10/15/21 1445  CIPROFLOXACIN HCL 500 MG PO TABS        500 mg Oral  Once 10/15/21 1434 10/15/21 1434       Current Outpatient Medications  Medication Sig  Dispense Refill   alfuzosin (UROXATRAL) 10 MG 24 hr tablet Take 1 tablet (10 mg total) by mouth daily with breakfast. 30 tablet 11   Cholecalciferol 25 MCG (1000 UT) tablet Take by mouth daily. Takes 10,000 units daily     clopidogrel (PLAVIX) 75 MG tablet TAKE ONE TABLET BY MOUTH ONCE DAILY AS A BLOOD THINNER. 90 tablet 0   Continuous Blood Gluc Receiver (DEXCOM G6 RECEIVER) DEVI Use to check blood glucose 4 times daily. 1 each 0   Continuous Blood Gluc Sensor (DEXCOM G6 SENSOR) MISC Use 1 sensor every 10 days. 9 each 1   Continuous Blood Gluc Sensor (FREESTYLE LIBRE 2 SENSOR) MISC USE TO CHECK BLOOD SUGAR AS DIRECTED. 6 each 0   Continuous Blood Gluc Transmit (DEXCOM G6 TRANSMITTER) MISC Use 1 transmitter every 90 days. 1 each 1   diazepam (VALIUM) 10 MG tablet Take 10 mg by mouth at bedtime as needed for anxiety or sleep. As Needed     ezetimibe (ZETIA) 10 MG tablet TAKE ONE TABLET BY MOUTH ONCE DAILY. 90 tablet 0   fenofibrate micronized (LOFIBRA) 67 MG capsule Take 1 capsule (67 mg total) by mouth daily before breakfast. 90 capsule 4   gabapentin (NEURONTIN) 400 MG capsule TAKE (1) CAPSULE BY MOUTH TWICE DAILY. 180 capsule 0   glucose blood (FREESTYLE TEST STRIPS) test strip Use as instructed 100 each 12   hydrochlorothiazide (HYDRODIURIL) 25 MG tablet TAKE (1) TABLET BY MOUTH TWICE DAILY FOR HIGH BLOOD PRESSURE. 180 tablet 0   HYDROcodone-acetaminophen (NORCO) 10-325 MG tablet TAKE (1) TABLET BY MOUTH FOUR TIMES DAILY FOR SEVERE PAIN. 120 tablet 0   icosapent Ethyl (VASCEPA) 1 g capsule Take 2 capsules (2 g total) by mouth 2 (two) times daily. 360 capsule 3   insulin NPH-regular Human (NOVOLIN 70/30) (70-30) 100  UNIT/ML injection Inject 48 Units into the skin 2 (two) times daily before a meal. When glucose is above 90 and eating     lansoprazole (PREVACID) 30 MG capsule Take 1 capsule (30 mg total) by mouth 2 (two) times daily before a meal. 60 capsule 2   losartan (COZAAR) 50 MG tablet Take 1 tablet (50 mg total) by mouth daily. 90 tablet 3   metFORMIN (GLUCOPHAGE-XR) 500 MG 24 hr tablet TAKE 1 TABLET BY MOUTH ONCE DAILY WITH BREAKFAST. 90 tablet 0   methocarbamol (ROBAXIN) 500 MG tablet Take 1 tablet (500 mg total) by mouth every 8 (eight) hours as needed for muscle spasms. 30 tablet 2   metoprolol tartrate (LOPRESSOR) 25 MG tablet TAKE 1 TABLET BY MOUTH TWICE A DAY. 60 tablet 5   nitroGLYCERIN (NITROSTAT) 0.4 MG SL tablet Place 0.4 mg under the tongue every 5 (five) minutes as needed for chest pain. Reported on 02/26/2016     NON FORMULARY Domperidone 10 mg Patient takes 2 by mouth daily     NONFORMULARY OR COMPOUNDED ITEM Compounded cream for foot pain. Gets at [employer].     ondansetron (ZOFRAN) 4 MG tablet Take 4 mg by mouth every 8 (eight) hours as needed for nausea or vomiting.     PARoxetine (PAXIL) 10 MG tablet TAKE ONE TABLET BY MOUTH ONCE DAILY FOR DEPRESSION. 90 tablet 0   polyethylene glycol (MIRALAX / GLYCOLAX) 17 g packet Take 17 g by mouth daily. 1 capful daily as needed     promethazine (PHENERGAN) 25 MG tablet Take 25 mg by mouth every 8 (eight) hours as needed for nausea or vomiting.  No current facility-administered medications for this visit.     Objective: Vital signs in last 24 hours: BP (!) 144/92    Pulse 86    Ht 5\' 9"  (1.753 m)    Wt 176 lb (79.8 kg)    BMI 25.99 kg/m   Intake/Output from previous day: No intake/output data recorded. Intake/Output this shift: @IOTHISSHIFT @   Physical Exam  Lab Results:  Results for orders placed or performed in visit on 10/15/21 (from the past 24 hour(s))  Urinalysis, Routine w reflex microscopic     Status: Abnormal    Collection Time: 10/15/21  2:12 PM  Result Value Ref Range   Specific Gravity, UA 1.025 1.005 - 1.030   pH, UA 5.5 5.0 - 7.5   Color, UA Yellow Yellow   Appearance Ur Clear Clear   Leukocytes,UA Negative Negative   Protein,UA Trace (A) Negative/Trace   Glucose, UA Negative Negative   Ketones, UA Negative Negative   RBC, UA Negative Negative   Bilirubin, UA Negative Negative   Urobilinogen, Ur 0.2 0.2 - 1.0 mg/dL   Nitrite, UA Negative Negative   Microscopic Examination Comment    Narrative   Performed at:  [facility] [address] [place], [place]  [id number] Lab Director: Mina Marble MT, Phone:  [phone]     BMET No results for input(s): NA, K, CL, CO2, GLUCOSE, BUN, CREATININE, CALCIUM in the last 72 hours. PT/INR No results for input(s): LABPROT, INR in the last 72 hours. ABG No results for input(s): PHART, HCO3 in the last 72 hours.  Invalid input(s): PCO2, PO2  Studies/Results: Procedure: Flexible cystoscopy.  He was prepped with betadine and 2% lidocaine jelly.  After the procedure he was given Cipro 500mg .  The scope was lubricated and passed without difficulty.  The urethra is normal.  The external sphincter is normal.  The prostatic urethra is 2-3cm with lateral lobe enlargement with coaptation and an elevated bladder neck.  The bladder has mild to moderate trabeculation without mucosal lesions.  The UO's are normal.  There were no complications.   FR after cystoscopy with 169ml voiding had a PF of 7ml/sec and a MF of 14ml/sec.   Assessment/Plan: Chronic prostatitis.   He has intermittent flairs of worsening LUTS with left testicular pain and is given Levaquin for his symptoms but his UA's have routinely been negative.   He may have more prostadynia.  He still has some burning.   BPH with BOO and severe symptoms on tamsulosin.   He has BPH with visual obstruction on cystoscopy and a slow stream.  I discussed alternate alpha blockers, Urolift,  Rezum and TURP and he would like to try alfuzosin since he is concerned about ejaculatory dysfunction.   He will return in 6 weeks.   ED.  He is not getting a full response to sildenafil.  He will try the tadalafil but will space the dose from the alfuzosin.    Meds ordered this encounter  Medications   ciprofloxacin (CIPRO) tablet 500 mg   alfuzosin (UROXATRAL) 10 MG 24 hr tablet    Sig: Take 1 tablet (10 mg total) by mouth daily with breakfast.    Dispense:  30 tablet    Refill:  11      Orders Placed This Encounter  Procedures   Urinalysis, Routine w reflex microscopic   PR COMPLEX UROFLOWMETRY   BLADDER SCAN AMB NON-IMAGING     Return in about 6 weeks (around 11/26/2021).  CC: Dr. [name].      Irine Seal 10/15/2021 [phone]

## 2021-10-20 NOTE — Telephone Encounter (Signed)
Notes faxed to surgeon. This phone note will be removed from the preop pool. Richardson Dopp, PA-C  10/20/2021 11:27 AM

## 2021-10-20 NOTE — Telephone Encounter (Signed)
Comment per Dr. Percival Spanish: Minus Breeding, MD  [facility] Clinical Pool 5 days ago   OK to hold Plavix if the procedure cannot be performed on Plavix.

## 2021-10-20 NOTE — Telephone Encounter (Signed)
° °  Name: [name] DOB: 1961/06/14  MRN: [id number]  Primary Cardiologist: Minus Breeding, MD  Chart reviewed as part of pre-operative protocol coverage.   See previous notes.  Recommendations: Based on ACC/AHA guidelines, the patient is at acceptable risk for the planned procedure and may proceed without further cardiovascular testing.  Ideally, the patient should remain on Clopidogrel (Plavix) without interruption.  However, if the bleeding risk is too great, Clopidogrel can be held for 5 days prior to the procedure and resumed post op when felt to be safe.     Please call with questions. Richardson Dopp, PA-C 10/20/2021, 11:23 AM

## 2021-10-21 ENCOUNTER — Encounter (INDEPENDENT_AMBULATORY_CARE_PROVIDER_SITE_OTHER): Payer: Self-pay

## 2021-10-21 ENCOUNTER — Telehealth (INDEPENDENT_AMBULATORY_CARE_PROVIDER_SITE_OTHER): Payer: Self-pay

## 2021-10-21 MED ORDER — PEG 3350-KCL-NA BICARB-NACL 420 G PO SOLR: 4000.0000 mL | Freq: Once | ORAL | 0 refills | Status: AC

## 2021-10-21 NOTE — Telephone Encounter (Signed)
[name] [name] [name], CMA  ?

## 2021-10-22 DIAGNOSIS — M79671 Pain in right foot: Secondary | ICD-10-CM | POA: Diagnosis not present

## 2021-10-22 DIAGNOSIS — I739 Peripheral vascular disease, unspecified: Secondary | ICD-10-CM | POA: Diagnosis not present

## 2021-10-22 DIAGNOSIS — E1140 Type 2 diabetes mellitus with diabetic neuropathy, unspecified: Secondary | ICD-10-CM | POA: Diagnosis not present

## 2021-10-22 DIAGNOSIS — M792 Neuralgia and neuritis, unspecified: Secondary | ICD-10-CM | POA: Diagnosis not present

## 2021-10-23 DIAGNOSIS — T466X5A Adverse effect of antihyperlipidemic and antiarteriosclerotic drugs, initial encounter: Secondary | ICD-10-CM

## 2021-10-23 NOTE — Progress Notes (Signed)
[facility])  ?                                          [facility] Quality [facility]acy Team  ?                                      Statin Quality Measure Assessment ?  ? ?10/23/2021 ? ?[name] ?01-Feb-1961 ?[id number] ? ?Dr. [name],  ? ?I am a [facility] clinical pharmacist that reviews patients for statin quality initiatives.    ? ?Per review of chart and payor information, patient has a diagnosis of diabetes and cardiovascular disease but is not currently filling a statin prescription.  This places patient into the SUPD (Statin Use In Patients with Diabetes) and SPC (Statin Use in Patients with Cardiovascular Disease) measures for CMS.   ? ?Patient has documented allergy to statin but no corresponding CPT codes that would exclude patient from SUPD and [facility] measure.  ? ?   ?Component Value Date/Time  ? CHOL 563 (H) 09/29/2021 1531  ? CHOL 229 (H) 01/14/2021 1348  ? TRIG 4,597 (H) 09/29/2021 1531  ? HDL NOT REPORTED DUE TO HIGH TRIGLYCERIDES 09/29/2021 1531  ? HDL 34 (L) 01/14/2021 1348  ? CHOLHDL NOT REPORTED DUE TO HIGH TRIGLYCERIDES 09/29/2021 1531  ? VLDL UNABLE TO CALCULATE IF TRIGLYCERIDE OVER 400 mg/dL 09/29/2021 1531  ? [place] NOT CALCULATED 09/29/2021 1531  ? [place] 96 01/14/2021 1348  ? LDLDIRECT 110 08/19/2016 1259  ? ? ?Please consider ONE of the following recommendations:  ?Initiate high intensity statin Atorvastatin 40mg  once daily, #90, 3 refills  ? Rosuvastatin 20mg  once daily, #90, 3 refills  ?  ?Initiate moderate intensity  ?statin with reduced frequency if prior  ?statin intolerance 1x weekly, #13, 3 refills  ? 2x weekly, #26, 3 refills  ? 3x weekly, #39, 3 refills  ?  ?Code for past statin intolerance  ?(required annually) ? ?Provider Requirements: ?Must associate code during an office visit or telehealth encounter  ? 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 Myositis, unspecified M60.9  ? Myopathy, unspecified G72.9  ? Rhabdomyolysis ? M62.82  ? Alcoholic  cirrhosis of liver without ascites K70.30  ? Alcoholic cirrhosis of liver with ascites K70.31  ? Unspecified cirrhosis of liver K74.60  ? Toxic liver disease with fibrosis and cirrhosis of liver K71.7  ? ? ? ? ?Please let us know your decision.   ? ?Thank you!  ? ? ? ? ?[name], PharmD ?Clinical Pharmacist  ?[place] ?[phone] ? ?

## 2021-10-26 ENCOUNTER — Encounter: Payer: PPO | Admitting: Internal Medicine

## 2021-10-27 ENCOUNTER — Other Ambulatory Visit: Payer: Self-pay | Admitting: Internal Medicine

## 2021-10-27 DIAGNOSIS — E1142 Type 2 diabetes mellitus with diabetic polyneuropathy: Secondary | ICD-10-CM

## 2021-10-28 DIAGNOSIS — K861 Other chronic pancreatitis: Secondary | ICD-10-CM | POA: Diagnosis not present

## 2021-10-31 ENCOUNTER — Other Ambulatory Visit: Payer: Self-pay | Admitting: Internal Medicine

## 2021-10-31 DIAGNOSIS — F339 Major depressive disorder, recurrent, unspecified: Secondary | ICD-10-CM

## 2021-11-02 ENCOUNTER — Other Ambulatory Visit: Payer: Self-pay

## 2021-11-02 ENCOUNTER — Ambulatory Visit (INDEPENDENT_AMBULATORY_CARE_PROVIDER_SITE_OTHER): Payer: Medicare Other

## 2021-11-02 DIAGNOSIS — E1140 Type 2 diabetes mellitus with diabetic neuropathy, unspecified: Secondary | ICD-10-CM | POA: Diagnosis not present

## 2021-11-02 DIAGNOSIS — Z0000 Encounter for general adult medical examination without abnormal findings: Secondary | ICD-10-CM | POA: Diagnosis not present

## 2021-11-02 DIAGNOSIS — Z794 Long term (current) use of insulin: Secondary | ICD-10-CM

## 2021-11-02 NOTE — Progress Notes (Signed)
Subjective:   [name] is a 61 y.o. male who presents for Medicare Annual/Subsequent preventive examination. I connected with  Maurice March on 11/02/21 by a audio enabled telemedicine application and verified that I am speaking with the correct person using two identifiers.  Patient Location: Home  Provider Location: Home Office  I discussed the limitations of evaluation and management by telemedicine. The patient expressed understanding and agreed to proceed.  Review of Systems           Objective:    There were no vitals filed for this visit. There is no height or weight on file to calculate BMI.  Advanced Directives 08/27/2018 03/11/2016  Does Patient Have a Medical Advance Directive? No No  Would patient like information on creating a medical advance directive? - Yes - Educational materials given    Current Medications (verified) Outpatient Encounter Medications as of 11/02/2021  Medication Sig   alfuzosin (UROXATRAL) 10 MG 24 hr tablet Take 1 tablet (10 mg total) by mouth daily with breakfast.   Cholecalciferol 25 MCG (1000 UT) tablet Take by mouth daily. Takes 10,000 units daily   clopidogrel (PLAVIX) 75 MG tablet TAKE ONE TABLET BY MOUTH ONCE DAILY AS A BLOOD THINNER.   Continuous Blood Gluc Receiver (DEXCOM G6 RECEIVER) DEVI Use to check blood glucose 4 times daily.   Continuous Blood Gluc Sensor (DEXCOM G6 SENSOR) MISC Use 1 sensor every 10 days.   Continuous Blood Gluc Sensor (FREESTYLE LIBRE 2 SENSOR) MISC USE TO CHECK BLOOD SUGAR AS DIRECTED.   Continuous Blood Gluc Transmit (DEXCOM G6 TRANSMITTER) MISC Use 1 transmitter every 90 days.   diazepam (VALIUM) 10 MG tablet Take 10 mg by mouth at bedtime as needed for anxiety or sleep. As Needed   ezetimibe (ZETIA) 10 MG tablet TAKE ONE TABLET BY MOUTH ONCE DAILY.   fenofibrate micronized (LOFIBRA) 67 MG capsule Take 1 capsule (67 mg total) by mouth daily before breakfast.   gabapentin (NEURONTIN) 400 MG capsule  TAKE (1) CAPSULE BY MOUTH TWICE DAILY.   glucose blood (FREESTYLE TEST STRIPS) test strip Use as instructed   hydrochlorothiazide (HYDRODIURIL) 25 MG tablet TAKE (1) TABLET BY MOUTH TWICE DAILY FOR HIGH BLOOD PRESSURE.   HYDROcodone-acetaminophen (NORCO) 10-325 MG tablet TAKE (1) TABLET BY MOUTH FOUR TIMES DAILY FOR SEVERE PAIN.   icosapent Ethyl (VASCEPA) 1 g capsule Take 2 capsules (2 g total) by mouth 2 (two) times daily.   insulin NPH-regular Human (NOVOLIN 70/30) (70-30) 100 UNIT/ML injection Inject 48 Units into the skin 2 (two) times daily before a meal. When glucose is above 90 and eating   lansoprazole (PREVACID) 30 MG capsule Take 1 capsule (30 mg total) by mouth 2 (two) times daily before a meal.   losartan (COZAAR) 50 MG tablet Take 1 tablet (50 mg total) by mouth daily.   metFORMIN (GLUCOPHAGE-XR) 500 MG 24 hr tablet TAKE 1 TABLET BY MOUTH ONCE DAILY WITH BREAKFAST.   methocarbamol (ROBAXIN) 500 MG tablet Take 1 tablet (500 mg total) by mouth every 8 (eight) hours as needed for muscle spasms.   metoprolol tartrate (LOPRESSOR) 25 MG tablet TAKE 1 TABLET BY MOUTH TWICE A DAY.   nitroGLYCERIN (NITROSTAT) 0.4 MG SL tablet Place 0.4 mg under the tongue every 5 (five) minutes as needed for chest pain. Reported on 02/26/2016   NON FORMULARY Domperidone 10 mg Patient takes 2 by mouth daily   NONFORMULARY OR COMPOUNDED ITEM Compounded cream for foot pain. Gets at [employer].  ondansetron (ZOFRAN) 4 MG tablet Take 4 mg by mouth every 8 (eight) hours as needed for nausea or vomiting.   PARoxetine (PAXIL) 10 MG tablet TAKE ONE TABLET BY MOUTH ONCE DAILY FOR DEPRESSION.   polyethylene glycol (MIRALAX / GLYCOLAX) 17 g packet Take 17 g by mouth daily. 1 capful daily as needed   promethazine (PHENERGAN) 25 MG tablet Take 25 mg by mouth every 8 (eight) hours as needed for nausea or vomiting.   No facility-administered encounter medications on file as of 11/02/2021.    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  Patent coronary bypass grafts with a free radial graft to PDA and LIMA to the  diag.   Diabetes mellitus    Diabetic peripheral neuropathy ([place]) 05/06/2020   Dyslipidemia    Poorly controlled, probably related to his DM (Some of this is secondary to his inability to afford medications).   ED (erectile dysfunction)    Esophageal yeast infection ([place])    Hypertension    Metaplasia of esophagus    Pancreatitis chronic    Pseudocyst   Polycythemia vera(238.4) 04/26/2012   Past Surgical History:  Procedure Laterality Date   APPENDECTOMY     CARDIAC CATHETERIZATION     CHOLECYSTECTOMY     COLONOSCOPY  07/31/1985   COLONOSCOPY N/A 03/11/2016   Procedure: COLONOSCOPY;  Surgeon: Rogene Houston, MD;  [facility];  Service: Endoscopy;  Laterality: N/A;   CORONARY ANGIOPLASTY WITH STENT PLACEMENT  2006   CORONARY ARTERY BYPASS GRAFT  11/24/2004   ESOPHAGOGASTRODUODENOSCOPY  09/19/01   ESOPHAGOGASTRODUODENOSCOPY N/A 03/11/2016   Procedure: ESOPHAGOGASTRODUODENOSCOPY (EGD);  Surgeon: Rogene Houston, MD;  [facility];  Service: Endoscopy;  Laterality: N/A;  12:00   FINGER TENDON REPAIR     in middle finger   KNEE SURGERY     left knee   NASAL SEPTUM SURGERY     Family History  Problem Relation Age of Onset   Coronary artery disease Mother        strong family history of   Heart attack Mother        dying of (at age 26)   Diabetes Mother    Diabetes Sister    Diabetes Brother    Pancreatitis Brother    Healthy Daughter    Hyperlipidemia Daughter     Heart disease Brother    Hypertension Brother    Hyperlipidemia Brother    Social History   Socioeconomic History   Marital status: Married    Spouse name: Not on file   Number of children: Not on file   Years of education: Not on file   Highest education level: Not on file  Occupational History   Not on file  Tobacco Use   Smoking status: Never   Smokeless tobacco: Never  Substance and Sexual Activity   Alcohol use: Yes    Comment: Patient may drink a beer or glass of wine a couple times a month   Drug use: No   Sexual activity: Not on file  Other Topics Concern   Not  on file  Social History Narrative   The patient lives in [place] with his wife.  He is     disabled secondary to coronary artery disease and pancreatitis.  He does     not smoke, he does not drink alcohol.  There is no drug use.    Social Determinants of Health   Financial Resource Strain: Not on file  Food Insecurity: Not on file  Transportation Needs: Not on file  Physical Activity: Not on file  Stress: Not on file  Social Connections: Not on file    Tobacco Counseling Counseling given: Not Answered   Clinical Intake:                 Diabetic?yes         Activities of Daily Living No flowsheet data found.  Patient Care Team: Lindell Spar, MD as PCP - General (Internal Medicine) Minus Breeding, MD as PCP - Cardiology (Cardiology) Case, Reche Dixon, MD as Attending Physician (Orthopedic Surgery)  Indicate any recent Medical Services you may have received from other than [facility] providers in the past year (date may be approximate).     Assessment:   This is a routine wellness examination for Riel.  Hearing/Vision screen No results found.  Dietary issues and exercise activities discussed:     Goals Addressed   None    Depression Screen PHQ 2/9 Scores 07/22/2021 05/06/2021 05/06/2021 03/26/2021 01/21/2021 12/16/2020 12/12/2020  PHQ - 2 Score 0 0 0 0 0 0 0  PHQ- 9  Score - 0 - - 10 0 -    Fall Risk Fall Risk  07/22/2021 05/06/2021 03/26/2021 01/21/2021 12/16/2020  Falls in the past year? 0 0 0 0 0  Number falls in past yr: 0 0 0 0 0  Injury with Fall? 0 0 0 0 0  Risk for fall due to : No Fall Risks No Fall Risks No Fall Risks No Fall Risks No Fall Risks  Follow up Falls evaluation completed Falls evaluation completed Falls evaluation completed Falls evaluation completed Falls evaluation completed    FALL RISK PREVENTION PERTAINING TO THE HOME:  Any stairs in or around the home? Yes  If so, are there any without handrails? No  Home free of loose throw rugs in walkways, pet beds, electrical cords, etc? Yes  Adequate lighting in your home to reduce risk of falls? Yes   ASSISTIVE DEVICES UTILIZED TO PREVENT FALLS:  Life alert? No  Use of a cane, walker or w/c? No  Grab bars in the bathroom? Yes  Shower chair or bench in shower? No  Elevated toilet seat or a handicapped toilet? Yes   TIMED UP AND GO:  Was the test performed? No .  Length of time to ambulate 10 feet:    Cognitive Function:     6CIT Screen 10/09/2020  What Year? 0 points  What month? 0 points  What time? 0 points  Count back from 20 0 points  Months in reverse 0 points  Repeat phrase 0 points  Total Score 0    Immunizations Immunization History  Administered Date(s) Administered   Influenza Split 07/23/2014    TDAP status: Up to date  Flu Vaccine status: Due, Education has been provided regarding the importance of this vaccine. Advised may receive this vaccine at local pharmacy or Health Dept. Aware to provide a copy of the vaccination record if obtained from local pharmacy or Health Dept. Verbalized acceptance and understanding.  Pneumococcal vaccine status: Due,  Education has been provided regarding the importance of this vaccine. Advised may receive this vaccine at local pharmacy or Health Dept. Aware to provide a copy of the vaccination record if obtained from  local pharmacy or Health Dept. Verbalized acceptance and understanding.  Covid-19 vaccine status: Declined, Education has been provided regarding the importance of this vaccine but patient still declined. Advised may receive this vaccine at local pharmacy or Health Dept.or vaccine clinic. Aware to provide a copy of the vaccination record if obtained from local pharmacy or Health Dept. Verbalized acceptance and understanding.  Qualifies for Shingles Vaccine? Yes   Zostavax completed No   Shingrix Completed?: No.    Education has been provided regarding the importance of this vaccine. Patient has been advised to call insurance company to determine out of pocket expense if they have not yet received this vaccine. Advised may also receive vaccine at local pharmacy or Health Dept. Verbalized acceptance and understanding.  Screening Tests Health Maintenance  Topic Date Due   COVID-19 Vaccine (1) Never done   Zoster Vaccines- Shingrix (1 of 2) Never done   FOOT EXAM  09/23/2021   INFLUENZA VACCINE  11/20/2021 (Originally 03/23/2021)   TETANUS/TDAP  12/12/2021 (Originally 09/10/1981)   OPHTHALMOLOGY EXAM  02/17/2022   HEMOGLOBIN A1C  03/29/2022   COLONOSCOPY (Pts 45-9yr Insurance coverage will need to be confirmed)  03/11/2026   Hepatitis C Screening  Completed   HIV Screening  Completed   HPV VACCINES  Aged Out    Health Maintenance  Health Maintenance Due  Topic Date Due   COVID-19 Vaccine (1) Never done   Zoster Vaccines- Shingrix (1 of 2) Never done   FOOT EXAM  09/23/2021    Colorectal cancer screening: Type of screening: Colonoscopy. Completed 2017. Repeat every 10 years  Lung Cancer Screening: (Low Dose CT Chest recommended if Age 59-80years, 30 pack-year currently smoking OR have quit w/in 15years.) does not qualify.   Lung Cancer Screening Referral:   Additional Screening:  Hepatitis C Screening: Completed   Vision Screening: Recommended annual ophthalmology exams for  early detection of glaucoma and other disorders of the eye. Is the patient up to date with their annual eye exam?  Yes  Who is the provider or what is the name of the office in which the patient attends annual eye exams? Dr CJorja LoaIf pt is not established with a provider, would they like to be referred to a provider to establish care?    .   Dental Screening: Recommended annual dental exams for proper oral hygiene  Community Resource Referral / Chronic Care Management: CRR required this visit?  No   CCM required this visit?  No      Plan:     I have personally reviewed and noted the following in the patients chart:   Medical and social history Use of alcohol, tobacco or illicit drugs  Current medications and supplements including opioid prescriptions. Patient is not currently taking opioid prescriptions. Functional ability and status Nutritional status Physical activity Advanced directives List of other physicians Hospitalizations, surgeries, and ER visits in previous 12 months Vitals Screenings to include cognitive, depression, and falls Referrals and appointments  In addition, I have reviewed and discussed with patient certain preventive protocols, quality metrics, and best practice recommendations. A written personalized care plan for preventive services as well as general preventive health recommendations were provided to patient.     JJill Side CGoodwin  11/02/2021   Nurse Notes:  Mr. AGuinta,  Thank you for taking time to come for your Medicare Wellness Visit. I appreciate your ongoing commitment to your health goals. Please review the following plan we discussed and let me know if I can assist you in the future.   These are the goals we discussed:  Goals      Increase physical activity     Try to walk daily as tolerated      Obtain Eye Exam-Diabetes Type 2        - schedule appointment with eye doctor    Why is this important?   Eye check-ups are important  when you have diabetes.  Vision loss can be prevented.    Notes:      Patient Stated     Patient wants to get better to walk more.     Prevent falls        This is a list of the screening recommended for you and due dates:  Health Maintenance  Topic Date Due   COVID-19 Vaccine (1) Never done   Zoster (Shingles) Vaccine (1 of 2) Never done   Complete foot exam   09/23/2021   Flu Shot  11/20/2021*   Tetanus Vaccine  12/12/2021*   Eye exam for diabetics  02/17/2022   Hemoglobin A1C  03/29/2022   Colon Cancer Screening  03/11/2026   Hepatitis C Screening: USPSTF Recommendation to screen - Ages 18-79 yo.  Completed   HIV Screening  Completed   HPV Vaccine  Aged Out  *Topic was postponed. The date shown is not the original due date.    Nutrition Risk Assessment:  Has the patient had any N/V/D within the last 2 months?  No  Does the patient have any non-healing wounds?  No  Has the patient had any unintentional weight loss or weight gain?  No   Diabetes:  Is the patient diabetic?  Yes  If diabetic, was a CBG obtained today?  No  Did the patient bring in their glucometer from home?   How often do you monitor your CBG's? Continuous   Financial Strains and Diabetes Management:  Are you having any financial strains with the device, your supplies or your medication? No .  Does the patient want to be seen by Chronic Care Management for management of their diabetes?  No  Would the patient like to be referred to a Nutritionist or for Diabetic Management?  Yes   Diabetic Exams:  Diabetic Eye Exam: Completed 2022

## 2021-11-02 NOTE — Patient Instructions (Addendum)
?  [name] , ?Thank you for taking time to come for your Medicare Wellness Visit. I appreciate your ongoing commitment to your health goals. Please review the following plan we discussed and let me know if I can assist you in the future.  ? ?These are the goals we discussed: ? Goals   ? ?  Increase physical activity   ?  Try to walk daily as tolerated  ?  ?  Obtain Eye Exam-Diabetes Type 2   ?   ?  ?- schedule appointment with eye doctor  ?  ?Why is this important?   ?Eye check-ups are important when you have diabetes.  ?Vision loss can be prevented.  ?  ?Notes:  ?  ?  Patient Stated   ?  Patient wants to get better to walk more. ?  ?  Prevent falls   ? ?  ?  ?This is a list of the screening recommended for you and due dates:  ?Health Maintenance  ?Topic Date Due  ? COVID-19 Vaccine (1) Never done  ? Zoster (Shingles) Vaccine (1 of 2) Never done  ? Complete foot exam   09/23/2021  ? Flu Shot  11/20/2021*  ? Tetanus Vaccine  12/12/2021*  ? Eye exam for diabetics  02/17/2022  ? Hemoglobin A1C  03/29/2022  ? Colon Cancer Screening  03/11/2026  ? Hepatitis C Screening: USPSTF Recommendation to screen - Ages 96-79 yo.  Completed  ? HIV Screening  Completed  ? HPV Vaccine  Aged Out  ?*Topic was postponed. The date shown is not the original due date.  ? ?

## 2021-11-03 ENCOUNTER — Ambulatory Visit (INDEPENDENT_AMBULATORY_CARE_PROVIDER_SITE_OTHER): Payer: Medicare Other | Admitting: Family Medicine

## 2021-11-03 ENCOUNTER — Ambulatory Visit: Payer: PPO | Admitting: "Endocrinology

## 2021-11-03 ENCOUNTER — Encounter: Payer: Self-pay | Admitting: Family Medicine

## 2021-11-03 DIAGNOSIS — J329 Chronic sinusitis, unspecified: Secondary | ICD-10-CM | POA: Diagnosis not present

## 2021-11-03 MED ORDER — DOXYCYCLINE HYCLATE 100 MG PO TABS: 100.0000 mg | ORAL_TABLET | Freq: Two times a day (BID) | ORAL | 0 refills | Status: DC

## 2021-11-03 NOTE — Progress Notes (Signed)
Virtual Visit via Telephone Note ? ?I connected with Maurice March on 11/03/21 at 11:40 AM EDT by telephone and verified that I am speaking with the correct person using two identifiers. ? ?Location: ?Patient:home ?Provider: office ?  ?I discussed the limitations, risks, security and privacy concerns of performing an evaluation and management service by telephone and the availability of in person appointments. I also discussed with the patient that there may be a patient responsible charge related to this service. The patient expressed understanding and agreed to proceed. ? ? ?History of Present Illness: ?Green nasal drainage, frontal and maxillary pressure, sore throat , some cough, sometime phlegm sometimes green, symptoms worsening over the past 1 week ?  ?Observations/Objective: ?There were no vitals taken for this visit. ?Good communication with no confusion and intact memory. ?Alert and oriented x 3 ?No signs of respiratory distress during speech ? ? ?Assessment and Plan: ?Sinusitis ?Doxycycline prescribed x 10 days, advised saline flushes of sinuses twice daily also ? ? ?Follow Up Instructions: ? ?  ?I discussed the assessment and treatment plan with the patient. The patient was provided an opportunity to ask questions and all were answered. The patient agreed with the plan and demonstrated an understanding of the instructions. ?  ?The patient was advised to call back or seek an in-person evaluation if the symptoms worsen or if the condition fails to improve as anticipated. ? ?I provided 8 minutes of non-face-to-face time during this encounter. ? ? ?Tula Nakayama, MD ? ?

## 2021-11-03 NOTE — Patient Instructions (Addendum)
F/u  with dr [name] as before, call if you need to be seen sooner ? ?10 day course of doxycycline is prescribed, please take entire course ? ?Flush sinuses with saline 3 times daily ? ?Thanks for choosing [facility], we consider it a privelige to serve you. ? ?

## 2021-11-05 NOTE — Patient Instructions (Signed)
? ? ? ? ? ? ? ? Maurice March ? 11/05/2021  ?  ? '@PREFPERIOPPHARMACY'$ @ ? ? Your procedure is scheduled on  11/11/2021. ? ? Report to [facility] at  1230 P.M. ? ? Call this number if you have problems the morning of surgery: ? [phone] ? ? Remember: ? Follow the diet and prep instructions given to you by the office. ?  ? Take these medicines the morning of surgery with A SIP OF WATER  ? ?gabapentin, hydrocodone(If needed), prevacid, robaxin, metoprolol, zofran (if needed), paxil, flomax. ?  ? Do not wear jewelry, make-up or nail polish. ? Do not wear lotions, powders, or perfumes, or deodorant. ? Do not shave 48 hours prior to surgery.  Men may shave face and neck. ? Do not bring valuables to the hospital. ? [facility] is not responsible for any belongings or valuables. ? ?Contacts, dentures or bridgework may not be worn into surgery.  Leave your suitcase in the car.  After surgery it may be brought to your room. ? ?For patients admitted to the hospital, discharge time will be determined by your treatment team. ? ?Patients discharged the day of surgery will not be allowed to drive home and must have someone with them for 24 hours.  ? ? ?Special instructions:   DO NOT smoke tobacco or vape for 24 hours before your procedure. ? ?Please read over the following fact sheets that you were given. ?Anesthesia Post-op Instructions and Care and Recovery After Surgery ?  ? ? ? Upper Endoscopy, Adult, Care After ?This sheet gives you information about how to care for yourself after your procedure. Your health care provider may also give you more specific instructions. If you have problems or questions, contact your health care provider. ?What can I expect after the procedure? ?After the procedure, it is common to have: ?A sore throat. ?Mild stomach pain or discomfort. ?Bloating. ?Nausea. ?Follow these instructions at home: ? ?Follow instructions from your health care provider about what to eat or drink after your  procedure. ?Return to your normal activities as told by your health care provider. Ask your health care provider what activities are safe for you. ?Take over-the-counter and prescription medicines only as told by your health care provider. ?If you were given a sedative during the procedure, it can affect you for several hours. Do not drive or operate machinery until your health care provider says that it is safe. ?Keep all follow-up visits as told by your health care provider. This is important. ?Contact a health care provider if you have: ?A sore throat that lasts longer than one day. ?Trouble swallowing. ?Get help right away if: ?You vomit blood or your vomit looks like coffee grounds. ?You have: ?A fever. ?Bloody, black, or tarry stools. ?A severe sore throat or you cannot swallow. ?Difficulty breathing. ?Severe pain in your chest or abdomen. ?Summary ?After the procedure, it is common to have a sore throat, mild stomach discomfort, bloating, and nausea. ?If you were given a sedative during the procedure, it can affect you for several hours. Do not drive or operate machinery until your health care provider says that it is safe. ?Follow instructions from your health care provider about what to eat or drink after your procedure. ?Return to your normal activities as told by your health care provider. ?This information is not intended to replace advice given to you by your health care provider. Make sure you discuss any questions you have with your health  care provider. ?Document Revised: 06/15/2019 Document Reviewed: 01/09/2018 ?Elsevier Patient Education ? [place]. ?Colonoscopy, Adult, Care After ?This sheet gives you information about how to care for yourself after your procedure. Your health care provider may also give you more specific instructions. If you have problems or questions, contact your health care provider. ?What can I expect after the procedure? ?After the procedure, it is common to  have: ?A small amount of blood in your stool for 24 hours after the procedure. ?Some gas. ?Mild cramping or bloating of your abdomen. ?Follow these instructions at home: ?Eating and drinking ? ?Drink enough fluid to keep your urine pale yellow. ?Follow instructions from your health care provider about eating or drinking restrictions. ?Resume your normal diet as instructed by your health care provider. Avoid heavy or fried foods that are hard to digest. ?Activity ?Rest as told by your health care provider. ?Avoid sitting for a long time without moving. Get up to take short walks every 1-2 hours. This is important to improve blood flow and breathing. Ask for help if you feel weak or unsteady. ?Return to your normal activities as told by your health care provider. Ask your health care provider what activities are safe for you. ?Managing cramping and bloating ? ?Try walking around when you have cramps or feel bloated. ?Apply heat to your abdomen as told by your health care provider. Use the heat source that your health care provider recommends, such as a moist heat pack or a heating pad. ?Place a towel between your skin and the heat source. ?Leave the heat on for 20-30 minutes. ?Remove the heat if your skin turns bright red. This is especially important if you are unable to feel pain, heat, or cold. You may have a greater risk of getting burned. ?General instructions ?If you were given a sedative during the procedure, it can affect you for several hours. Do not drive or operate machinery until your health care provider says that it is safe. ?For the first 24 hours after the procedure: ?Do not sign important documents. ?Do not drink alcohol. ?Do your regular daily activities at a slower pace than normal. ?Eat soft foods that are easy to digest. ?Take over-the-counter and prescription medicines only as told by your health care provider. ?Keep all follow-up visits as told by your health care provider. This is  important. ?Contact a health care provider if: ?You have blood in your stool 2-3 days after the procedure. ?Get help right away if you have: ?More than a small spotting of blood in your stool. ?Large blood clots in your stool. ?Swelling of your abdomen. ?Nausea or vomiting. ?A fever. ?Increasing pain in your abdomen that is not relieved with medicine. ?Summary ?After the procedure, it is common to have a small amount of blood in your stool. You may also have mild cramping and bloating of your abdomen. ?If you were given a sedative during the procedure, it can affect you for several hours. Do not drive or operate machinery until your health care provider says that it is safe. ?Get help right away if you have a lot of blood in your stool, nausea or vomiting, a fever, or increased pain in your abdomen. ?This information is not intended to replace advice given to you by your health care provider. Make sure you discuss any questions you have with your health care provider. ?Document Revised: 06/15/2019 Document Reviewed: 03/05/2019 ?Elsevier Patient Education ? [place]. ?Monitored Anesthesia Care, Care After ?This sheet  gives you information about how to care for yourself after your procedure. Your health care provider may also give you more specific instructions. If you have problems or questions, contact your health care provider. ?What can I expect after the procedure? ?After the procedure, it is common to have: ?Tiredness. ?Forgetfulness about what happened after the procedure. ?Impaired judgment for important decisions. ?Nausea or vomiting. ?Some difficulty with balance. ?Follow these instructions at home: ?For the time period you were told by your health care provider: ?  ?Rest as needed. ?Do not participate in activities where you could fall or become injured. ?Do not drive or use machinery. ?Do not drink alcohol. ?Do not take sleeping pills or medicines that cause drowsiness. ?Do not make important  decisions or sign legal documents. ?Do not take care of children on your own. ?Eating and drinking ?Follow the diet that is recommended by your health care provider. ?Drink enough fluid to keep your urine pale yellow. ?If you vomit: ?

## 2021-11-06 ENCOUNTER — Encounter (HOSPITAL_COMMUNITY): Payer: Self-pay

## 2021-11-06 ENCOUNTER — Other Ambulatory Visit (HOSPITAL_COMMUNITY): Payer: Medicare Other

## 2021-11-06 ENCOUNTER — Encounter (HOSPITAL_COMMUNITY)
Admission: RE | Admit: 2021-11-06 | Discharge: 2021-11-06 | Disposition: A | Discharge status: Home / Self Care | Payer: Medicare Other | Source: Ambulatory Visit | Attending: Internal Medicine | Admitting: Internal Medicine

## 2021-11-06 DIAGNOSIS — K219 Gastro-esophageal reflux disease without esophagitis: Secondary | ICD-10-CM | POA: Diagnosis not present

## 2021-11-06 DIAGNOSIS — Z01812 Encounter for preprocedural laboratory examination: Secondary | ICD-10-CM | POA: Diagnosis not present

## 2021-11-06 DIAGNOSIS — K2270 Barrett's esophagus without dysplasia: Secondary | ICD-10-CM | POA: Insufficient documentation

## 2021-11-06 DIAGNOSIS — K3184 Gastroparesis: Secondary | ICD-10-CM | POA: Diagnosis not present

## 2021-11-06 DIAGNOSIS — K861 Other chronic pancreatitis: Secondary | ICD-10-CM | POA: Insufficient documentation

## 2021-11-06 HISTORY — DX: Other specified postprocedural states: Z98.890

## 2021-11-06 HISTORY — DX: Nausea with vomiting, unspecified: R11.2

## 2021-11-06 LAB — BASIC METABOLIC PANEL
Anion gap: 13 (ref 5–15)
BUN: 31 mg/dL — ABNORMAL HIGH (ref 6–20)
CO2: 23 mmol/L (ref 22–32)
Calcium: 9.3 mg/dL (ref 8.9–10.3)
Chloride: 100 mmol/L (ref 98–111)
Creatinine, Ser: 1.72 mg/dL — ABNORMAL HIGH (ref 0.61–1.24)
GFR, Estimated: 45 mL/min — ABNORMAL LOW (ref >60)
Glucose, Bld: 224 mg/dL — ABNORMAL HIGH (ref 70–99)
Potassium: 3.8 mmol/L (ref 3.5–5.1)
Sodium: 136 mmol/L (ref 135–145)

## 2021-11-06 LAB — PANCREATIC ELASTASE, FECAL: Pancreatic Elastase-1, Stool: 414 mcg/g

## 2021-11-08 ENCOUNTER — Encounter: Payer: Self-pay | Admitting: Family Medicine

## 2021-11-08 DIAGNOSIS — E1140 Type 2 diabetes mellitus with diabetic neuropathy, unspecified: Secondary | ICD-10-CM | POA: Diagnosis not present

## 2021-11-08 NOTE — Assessment & Plan Note (Signed)
Doxycycline prescribed x 10 days, advised saline flushes of sinuses twice daily also ?

## 2021-11-11 ENCOUNTER — Ambulatory Visit (HOSPITAL_COMMUNITY): Payer: Medicare Other | Admitting: Anesthesiology

## 2021-11-11 ENCOUNTER — Encounter (INDEPENDENT_AMBULATORY_CARE_PROVIDER_SITE_OTHER): Payer: Self-pay | Admitting: *Deleted

## 2021-11-11 ENCOUNTER — Ambulatory Visit (HOSPITAL_BASED_OUTPATIENT_CLINIC_OR_DEPARTMENT_OTHER): Payer: Medicare Other | Admitting: Anesthesiology

## 2021-11-11 ENCOUNTER — Encounter (HOSPITAL_COMMUNITY)
Admission: RE | Disposition: A | Discharge status: Home / Self Care | Payer: Self-pay | Source: Home / Self Care | Attending: Internal Medicine

## 2021-11-11 ENCOUNTER — Ambulatory Visit (HOSPITAL_COMMUNITY)
Admission: RE | Admit: 2021-11-11 | Discharge: 2021-11-11 | Disposition: A | Discharge status: Home / Self Care | Payer: Medicare Other | Attending: Internal Medicine | Admitting: Internal Medicine

## 2021-11-11 ENCOUNTER — Encounter (HOSPITAL_COMMUNITY): Payer: Self-pay | Admitting: Internal Medicine

## 2021-11-11 DIAGNOSIS — Z951 Presence of aortocoronary bypass graft: Secondary | ICD-10-CM | POA: Diagnosis not present

## 2021-11-11 DIAGNOSIS — K644 Residual hemorrhoidal skin tags: Secondary | ICD-10-CM | POA: Diagnosis not present

## 2021-11-11 DIAGNOSIS — D127 Benign neoplasm of rectosigmoid junction: Secondary | ICD-10-CM | POA: Insufficient documentation

## 2021-11-11 DIAGNOSIS — Z7984 Long term (current) use of oral hypoglycemic drugs: Secondary | ICD-10-CM | POA: Diagnosis not present

## 2021-11-11 DIAGNOSIS — K2270 Barrett's esophagus without dysplasia: Secondary | ICD-10-CM

## 2021-11-11 DIAGNOSIS — Z09 Encounter for follow-up examination after completed treatment for conditions other than malignant neoplasm: Secondary | ICD-10-CM | POA: Diagnosis not present

## 2021-11-11 DIAGNOSIS — F419 Anxiety disorder, unspecified: Secondary | ICD-10-CM | POA: Insufficient documentation

## 2021-11-11 DIAGNOSIS — K635 Polyp of colon: Secondary | ICD-10-CM

## 2021-11-11 DIAGNOSIS — I25118 Atherosclerotic heart disease of native coronary artery with other forms of angina pectoris: Secondary | ICD-10-CM | POA: Diagnosis not present

## 2021-11-11 DIAGNOSIS — Z79899 Other long term (current) drug therapy: Secondary | ICD-10-CM | POA: Diagnosis not present

## 2021-11-11 DIAGNOSIS — E1165 Type 2 diabetes mellitus with hyperglycemia: Secondary | ICD-10-CM | POA: Insufficient documentation

## 2021-11-11 DIAGNOSIS — I10 Essential (primary) hypertension: Secondary | ICD-10-CM | POA: Diagnosis not present

## 2021-11-11 DIAGNOSIS — K861 Other chronic pancreatitis: Secondary | ICD-10-CM

## 2021-11-11 DIAGNOSIS — E1151 Type 2 diabetes mellitus with diabetic peripheral angiopathy without gangrene: Secondary | ICD-10-CM | POA: Insufficient documentation

## 2021-11-11 DIAGNOSIS — K5730 Diverticulosis of large intestine without perforation or abscess without bleeding: Secondary | ICD-10-CM | POA: Insufficient documentation

## 2021-11-11 DIAGNOSIS — Z86010 Personal history of colonic polyps: Secondary | ICD-10-CM

## 2021-11-11 DIAGNOSIS — I25119 Atherosclerotic heart disease of native coronary artery with unspecified angina pectoris: Secondary | ICD-10-CM | POA: Insufficient documentation

## 2021-11-11 DIAGNOSIS — K219 Gastro-esophageal reflux disease without esophagitis: Secondary | ICD-10-CM | POA: Diagnosis not present

## 2021-11-11 DIAGNOSIS — Z1211 Encounter for screening for malignant neoplasm of colon: Secondary | ICD-10-CM | POA: Insufficient documentation

## 2021-11-11 DIAGNOSIS — F32A Depression, unspecified: Secondary | ICD-10-CM | POA: Insufficient documentation

## 2021-11-11 DIAGNOSIS — Z794 Long term (current) use of insulin: Secondary | ICD-10-CM | POA: Insufficient documentation

## 2021-11-11 DIAGNOSIS — K2289 Other specified disease of esophagus: Secondary | ICD-10-CM | POA: Diagnosis not present

## 2021-11-11 DIAGNOSIS — Z7902 Long term (current) use of antithrombotics/antiplatelets: Secondary | ICD-10-CM | POA: Insufficient documentation

## 2021-11-11 DIAGNOSIS — K3184 Gastroparesis: Secondary | ICD-10-CM

## 2021-11-11 HISTORY — PX: COLONOSCOPY WITH PROPOFOL: SHX5780

## 2021-11-11 HISTORY — PX: ESOPHAGOGASTRODUODENOSCOPY (EGD) WITH PROPOFOL: SHX5813

## 2021-11-11 HISTORY — PX: BIOPSY: SHX5522

## 2021-11-11 HISTORY — PX: POLYPECTOMY: SHX5525

## 2021-11-11 LAB — HM COLONOSCOPY

## 2021-11-11 LAB — GLUCOSE, CAPILLARY: Glucose-Capillary: 123 mg/dL — ABNORMAL HIGH (ref 70–99)

## 2021-11-11 SURGERY — COLONOSCOPY WITH PROPOFOL
Anesthesia: General

## 2021-11-11 MED ORDER — LACTATED RINGERS IV SOLN: INTRAVENOUS | Status: DC

## 2021-11-11 MED ORDER — PHENYLEPHRINE 40 MCG/ML (10ML) SYRINGE FOR IV PUSH (FOR BLOOD PRESSURE SUPPORT)
PREFILLED_SYRINGE | INTRAVENOUS | Status: DC | PRN
  Administered 2021-11-11 (×5): 80 ug via INTRAVENOUS

## 2021-11-11 MED ORDER — EPHEDRINE SULFATE-NACL 50-0.9 MG/10ML-% IV SOSY
PREFILLED_SYRINGE | INTRAVENOUS | Status: DC | PRN
  Administered 2021-11-11 (×3): 5 mg via INTRAVENOUS

## 2021-11-11 MED ORDER — LIDOCAINE HCL (CARDIAC) PF 100 MG/5ML IV SOSY
PREFILLED_SYRINGE | INTRAVENOUS | Status: DC | PRN
Start: 2021-11-11 — End: 2021-11-11
  Administered 2021-11-11: 50 mg via INTRAVENOUS

## 2021-11-11 MED ORDER — STERILE WATER FOR IRRIGATION IR SOLN
Status: DC | PRN
  Administered 2021-11-11: 120 mL

## 2021-11-11 MED ORDER — PROPOFOL 10 MG/ML IV BOLUS
INTRAVENOUS | Status: DC | PRN
  Administered 2021-11-11: 40 mg via INTRAVENOUS
  Administered 2021-11-11: 100 mg via INTRAVENOUS

## 2021-11-11 MED ORDER — PROPOFOL 500 MG/50ML IV EMUL
INTRAVENOUS | Status: DC | PRN
Start: 2021-11-11 — End: 2021-11-11
  Administered 2021-11-11: 150 ug/kg/min via INTRAVENOUS

## 2021-11-11 NOTE — Anesthesia Preprocedure Evaluation (Addendum)
Anesthesia Evaluation  ?Patient identified by MRN, date of birth, ID band ?Patient awake ? ? ? ?Reviewed: ?Allergy & Precautions, NPO status , Patient's Chart, lab work & pertinent test results, reviewed documented beta blocker date and time  ? ?History of Anesthesia Complications ?(+) PONV and history of anesthetic complications ? ?Airway ?Mallampati: II ? ?TM Distance: >3 FB ?Neck ROM: Full ? ? ? Dental ? ?(+) Dental Advisory Given, Chipped,  ?  ?Pulmonary ?neg pulmonary ROS,  ?  ?Pulmonary exam normal ?breath sounds clear to auscultation ? ? ? ? ? ? Cardiovascular ?Exercise Tolerance: Good ?hypertension, Pt. on home beta blockers and Pt. on medications ?+ angina + CAD, + CABG and + Peripheral Vascular Disease  ?Normal cardiovascular exam ?Rhythm:Regular Rate:Normal ? ? ?  ?Neuro/Psych ?PSYCHIATRIC DISORDERS Anxiety Depression  Neuromuscular disease   ? GI/Hepatic ?Neg liver ROS, GERD  Medicated and Controlled,  ?Endo/Other  ?diabetes, Poorly Controlled, Type 2, Oral Hypoglycemic Agents, Insulin Dependent ? Renal/GU ?negative Renal ROS  ?negative genitourinary ?  ?Musculoskeletal ?negative musculoskeletal ROS ?(+)  ? Abdominal ?  ?Peds ?negative pediatric ROS ?(+)  Hematology ? ?(+) Blood dyscrasia (polycythemia vera), anemia ,   ?Anesthesia Other Findings ?Cervical spinal stenosis ? Reproductive/Obstetrics ?negative OB ROS ? ?  ? ? ? ? ? ? ? ? ? ? ? ? ? ?  ?  ? ? ? ? ? ?Anesthesia Physical ?Anesthesia Plan ? ?ASA: 3 ? ?Anesthesia Plan: General  ? ?Post-op Pain Management: Minimal or no pain anticipated  ? ?Induction: Intravenous ? ?PONV Risk Score and Plan: TIVA ? ?Airway Management Planned: Nasal Cannula and Natural Airway ? ?Additional Equipment:  ? ?Intra-op Plan:  ? ?Post-operative Plan:  ? ?Informed Consent: I have reviewed the patients History and Physical, chart, labs and discussed the procedure including the risks, benefits and alternatives for the proposed anesthesia  with the patient or authorized representative who has indicated his/her understanding and acceptance.  ? ? ? ?Dental advisory given ? ?Plan Discussed with: Surgeon and CRNA ? ?Anesthesia Plan Comments:   ? ? ? ? ? ? ?Anesthesia Quick Evaluation ? ?

## 2021-11-11 NOTE — Op Note (Signed)
[facility] ?Patient Name: [name] ?Procedure Date: 11/11/2021 1:05 PM ?MRN: [id number] ?Date of Birth: 03/01/1961 ?Attending MD: Hildred Laser , MD ?CSN: [id number] ?Age: 61 ?Admit Type: Outpatient ?Procedure:                Colonoscopy ?Indications:              High risk colon cancer surveillance: Personal  ?                          history of colonic polyps ?Providers:                Hildred Laser, MD, Lambert Mody, Stacy  ?                          Shanon Brow, Technician ?Referring MD:             Agnes Lawrence, MD ?Medicines:                Propofol per Anesthesia ?Complications:            No immediate complications. ?Estimated Blood Loss:     Estimated blood loss was minimal. ?Procedure:                Pre-Anesthesia Assessment: ?                          - Prior to the procedure, a History and Physical  ?                          was performed, and patient medications and  ?                          allergies were reviewed. The patient's tolerance of  ?                          previous anesthesia was also reviewed. The risks  ?                          and benefits of the procedure and the sedation  ?                          options and risks were discussed with the patient.  ?                          All questions were answered, and informed consent  ?                          was obtained. Prior Anticoagulants: The patient  ?                          last took Plavix (clopidogrel) 3 days prior to the  ?                          procedure. ASA Grade Assessment: III - A patient  ?  with severe systemic disease. After reviewing the  ?                          risks and benefits, the patient was deemed in  ?                          satisfactory condition to undergo the procedure. ?                          After obtaining informed consent, the colonoscope  ?                          was passed under direct vision. Throughout the  ?                          procedure,  the patient's blood pressure, pulse, and  ?                          oxygen saturations were monitored continuously. The  ?                          PCF-HQ190L ([id number]) scope was introduced through  ?                          the anus and advanced to the the cecum, identified  ?                          by appendiceal orifice and ileocecal valve. The  ?                          colonoscopy was performed without difficulty. The  ?                          patient tolerated the procedure well. The quality  ?                          of the bowel preparation was good. The ileocecal  ?                          valve, appendiceal orifice, and rectum were  ?                          photographed. ?Scope In: 1:50:02 PM ?Scope Out: 2:11:12 PM ?Scope Withdrawal Time: 0 hours 15 minutes 9 seconds  ?Total Procedure Duration: 0 hours 21 minutes 10 seconds  ?Findings: ?     The perianal and digital rectal examinations were normal. ?     A few diverticula were found in the sigmoid colon. ?     A small polyp was found in the recto-sigmoid colon. The polyp was  ?     removed with a cold snare. Resection and retrieval were complete. The  ?     pathology specimen was placed into Bottle Number 2. ?     External hemorrhoids were found during retroflexion. ?Impression:               -  Diverticulosis in the sigmoid colon. ?                          - One small polyp at the recto-sigmoid colon,  ?                          removed with a cold snare. Resected and retrieved. ?                          - External hemorrhoids. ?Moderate Sedation: ?     Per Anesthesia Care ?Recommendation:           - Patient has a contact number available for  ?                          emergencies. The signs and symptoms of potential  ?                          delayed complications were discussed with the  ?                          patient. Return to normal activities tomorrow.  ?                          Written discharge instructions were provided to the   ?                          patient. ?                          - High fiber diet, low fiber diet and diabetic  ?                          (ADA) diet today. ?                          - Continue present medications. ?                          - Resume Plavix (clopidogrel) at prior dose  ?                          tomorrow. ?                          - Await pathology results. ?                          - Repeat colonoscopy is recommended. The  ?                          colonoscopy date will be determined after pathology  ?                          results from today's exam become available for  ?  review. ?Procedure Code(s):        --- Professional --- ?                          [phone], Colonoscopy, flexible; with removal of  ?                          tumor(s), polyp(s), or other lesion(s) by snare  ?                          technique ?Diagnosis Code(s):        --- Professional --- ?                          Z86.010, Personal history of colonic polyps ?                          K64.4, Residual hemorrhoidal skin tags ?                          K63.5, Polyp of colon ?                          K57.30, Diverticulosis of large intestine without  ?                          perforation or abscess without bleeding ?CPT copyright 2019 American Medical Association. All rights reserved. ?The codes documented in this report are preliminary and upon coder review may  ?be revised to meet current compliance requirements. ?Hildred Laser, MD ?Hildred Laser, MD ?11/11/2021 2:27:23 PM ?This report has been signed electronically. ?Number of Addenda: 0 ?

## 2021-11-11 NOTE — Transfer of Care (Signed)
Immediate Anesthesia Transfer of Care Note ? ?Patient: [name] ? ?Procedure(s) Performed: COLONOSCOPY WITH PROPOFOL ?ESOPHAGOGASTRODUODENOSCOPY (EGD) WITH PROPOFOL ?BIOPSY ?POLYPECTOMY ? ?Patient Location: Short Stay ? ?Anesthesia Type:General ? ?Level of Consciousness: awake ? ?Airway & Oxygen Therapy: Patient Spontanous Breathing ?  ?Post-op Assessment: Report given to RN and Post -op Vital signs reviewed and stable ? ?Post vital signs: Reviewed and stable ? ?Last Vitals:  ?Vitals Value Taken Time  ?BP    ?Temp    ?Pulse    ?Resp    ?SpO2    ? ? ?Last Pain:  ?Vitals:  ? 11/11/21 1332  ?PainSc: 0-No pain  ?   ? ?  ? ?Complications: No notable events documented. ?

## 2021-11-11 NOTE — Anesthesia Postprocedure Evaluation (Signed)
Anesthesia Post Note ? ?Patient: [name] ? ?Procedure(s) Performed: COLONOSCOPY WITH PROPOFOL ?ESOPHAGOGASTRODUODENOSCOPY (EGD) WITH PROPOFOL ?BIOPSY ?POLYPECTOMY ? ?Patient location during evaluation: Phase II ?Anesthesia Type: General ?Level of consciousness: awake and alert and oriented ?Pain management: pain level controlled ?Vital Signs Assessment: post-procedure vital signs reviewed and stable ?Respiratory status: spontaneous breathing, nonlabored ventilation and respiratory function stable ?Cardiovascular status: blood pressure returned to baseline and stable ?Postop Assessment: no apparent nausea or vomiting ?Anesthetic complications: no ? ? ?No notable events documented. ? ? ?Last Vitals:  ?Vitals:  ? 11/11/21 1304 11/11/21 1413  ?BP: 131/83 109/66  ?Pulse: (!) 59 67  ?Resp: 11 14  ?Temp: 36.4 ?C 36.6 ?C  ?SpO2: 99% 97%  ?  ?Last Pain:  ?Vitals:  ? 11/11/21 1413  ?TempSrc: Oral  ?PainSc: 0-No pain  ? ? ?  ?  ?  ?  ?  ?  ? ?Tiesha Marich C Valorie Mcgrory ? ? ? ? ?

## 2021-11-11 NOTE — H&P (Signed)
[name] is an 61 y.o. male.   Chief Complaint: Patient is here for esophagogastroduodenoscopy and colonoscopy. HPI: Patient is 61 year old Caucasian male with multiple medical problems who also has chronic GERD complicated by short segment Barrett's esophagus as well as history of colonic adenoma who is here for surveillance EGD and a colonoscopy.  His last EGD and colonoscopy were in July 2017.  Patient was seen in the office few weeks ago.  He was complaining of having stools more frequently.  Pancreatic enzyme supplement was stopped and fecal elastase was checked and was normal.  He states his bowels are back to normal.  He denies dysphagia nausea vomiting melena or rectal bleeding. Family history is negative for colon cancer. 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  Patent coronary bypass grafts with a free radial graft to PDA and LIMA to the  diag.   Diabetes mellitus    Diabetic peripheral neuropathy (HCC) 05/06/2020   Dyslipidemia    Poorly controlled, probably related to his DM (Some of this is secondary to his inability to afford medications).   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/2012   PONV (postoperative nausea and vomiting)     Past Surgical History:  Procedure Laterality Date   APPENDECTOMY     CARDIAC CATHETERIZATION     CHOLECYSTECTOMY     COLONOSCOPY  07/31/1985   COLONOSCOPY N/A 03/11/2016   Procedure: COLONOSCOPY;  Surgeon: Malissa Hippo, MD;  [facility];  Service: Endoscopy;  Laterality: N/A;   CORONARY ANGIOPLASTY WITH STENT PLACEMENT  2006   CORONARY  ARTERY BYPASS GRAFT  11/24/2004   ESOPHAGOGASTRODUODENOSCOPY  09/19/01   ESOPHAGOGASTRODUODENOSCOPY N/A 03/11/2016   Procedure: ESOPHAGOGASTRODUODENOSCOPY (EGD);  Surgeon: Malissa Hippo, MD;  [facility];  Service: Endoscopy;  Laterality: N/A;  12:00   FINGER TENDON REPAIR     in middle finger   KNEE SURGERY     left knee   NASAL SEPTUM SURGERY      Family History  Problem Relation Age of Onset   Coronary artery disease Mother        strong family history of   Heart attack Mother        dying of (at age 40)   Diabetes Mother    Diabetes Sister    Diabetes Brother    Pancreatitis Brother    Healthy Daughter    Hyperlipidemia Daughter    Heart disease Brother    Hypertension Brother    Hyperlipidemia Brother    Social History:  reports that he has never smoked. He has never used smokeless tobacco. He reports that he does not currently use alcohol. He reports that he does not use drugs.  Allergies:  Allergies  Allergen Reactions   Iodinated Contrast Media Hives, Itching and Nausea Only    Flushing, Burning, itching Other reaction(s): Angioedema (ALLERGY/intolerance)    Iohexol Hives and Shortness Of Breath     Desc: HIVES,SOB NEEDS PREP.MEDS    Metoclopramide Hcl  Hives    Patient became very spastic   Propofol Other (See Comments)    .Pancreatic attack.    Alfuzosin Hcl Er     Dropped blood pressure   Propofol Other (See Comments)    Pancreatic attack.   Sulfonamide Derivatives Hives    Large hives   Enalapril Cough   Penicillins Rash    Has patient had a PCN reaction causing immediate rash, facial/tongue/throat swelling, SOB or lightheadedness with hypotension: Yes Has patient had a PCN reaction causing severe rash involving mucus membranes or skin necrosis: No Has patient had a PCN reaction that required hospitalization No Has patient had a PCN reaction occurring within the last 10 years: No. If all of the above answers are "NO", then may proceed  with Cephalosporin use.    Sulfa Antibiotics Rash    Other Reaction: Not Assessed    Medications Prior to Admission  Medication Sig Dispense Refill   Cholecalciferol 125 MCG (5000 UT) TABS Take 10,000 Units by mouth daily.     clopidogrel (PLAVIX) 75 MG tablet TAKE ONE TABLET BY MOUTH ONCE DAILY AS A BLOOD THINNER. 90 tablet 0   diazepam (VALIUM) 10 MG tablet Take 5 mg by mouth at bedtime as needed for anxiety or sleep.     ezetimibe (ZETIA) 10 MG tablet TAKE ONE TABLET BY MOUTH ONCE DAILY. 90 tablet 0   fenofibrate micronized (LOFIBRA) 67 MG capsule Take 1 capsule (67 mg total) by mouth daily before breakfast. 90 capsule 4   gabapentin (NEURONTIN) 400 MG capsule TAKE (1) CAPSULE BY MOUTH TWICE DAILY. (Patient taking differently: Take 400 mg by mouth 3 (three) times daily as needed (neuropathy).) 180 capsule 0   hydrochlorothiazide (HYDRODIURIL) 25 MG tablet TAKE (1) TABLET BY MOUTH TWICE DAILY FOR HIGH BLOOD PRESSURE. (Patient taking differently: Take 25 mg by mouth in the morning.) 180 tablet 0   HYDROcodone-acetaminophen (NORCO) 10-325 MG tablet TAKE (1) TABLET BY MOUTH FOUR TIMES DAILY FOR SEVERE PAIN. 120 tablet 0   icosapent Ethyl (VASCEPA) 1 g capsule Take 2 capsules (2 g total) by mouth 2 (two) times daily. 360 capsule 3   insulin NPH-regular Human (NOVOLIN 70/30) (70-30) 100 UNIT/ML injection Inject 50 Units into the skin 2 (two) times daily before a meal. When glucose is above 90 and eating     lansoprazole (PREVACID) 30 MG capsule Take 1 capsule (30 mg total) by mouth 2 (two) times daily before a meal. (Patient taking differently: Take 30 mg by mouth daily as needed (acid reflux).) 60 capsule 2   losartan (COZAAR) 50 MG tablet Take 1 tablet (50 mg total) by mouth daily. (Patient taking differently: Take 25 mg by mouth at bedtime.) 90 tablet 3   metFORMIN (GLUCOPHAGE-XR) 500 MG 24 hr tablet TAKE 1 TABLET BY MOUTH ONCE DAILY WITH BREAKFAST. 90 tablet 0   methocarbamol (ROBAXIN) 500 MG  tablet Take 1 tablet (500 mg total) by mouth every 8 (eight) hours as needed for muscle spasms. 30 tablet 2   metoprolol tartrate (LOPRESSOR) 25 MG tablet TAKE 1 TABLET BY MOUTH TWICE A DAY. 60 tablet 5   nitroGLYCERIN (NITROSTAT) 0.4 MG SL tablet Place 0.4 mg under the tongue every 5 (five) minutes as needed for chest pain. Reported on 02/26/2016     NON FORMULARY Take 20 mg by mouth daily. Domperidone (Motilium) 10 mg     NONFORMULARY OR COMPOUNDED ITEM Apply 1 application. topically daily as needed (neuropathy). Compounded cream for foot pain. Gets at [place]  [facility].     ondansetron (ZOFRAN) 4 MG tablet Take 4 mg by mouth every 8 (eight) hours as needed for nausea or vomiting.     PARoxetine (PAXIL) 10 MG tablet TAKE ONE TABLET BY MOUTH ONCE DAILY FOR DEPRESSION. 90 tablet 0   polyethylene glycol (MIRALAX / GLYCOLAX) 17 g packet Take 17 g by mouth daily as needed for moderate constipation.     tamsulosin (FLOMAX) 0.4 MG CAPS capsule Take 0.4 mg by mouth daily.     alfuzosin (UROXATRAL) 10 MG 24 hr tablet Take 1 tablet (10 mg total) by mouth daily with breakfast. (Patient not taking: Reported on 11/03/2021) 30 tablet 11   Continuous Blood Gluc Receiver (DEXCOM G6 RECEIVER) DEVI Use to check blood glucose 4 times daily. 1 each 0   Continuous Blood Gluc Sensor (DEXCOM G6 SENSOR) MISC Use 1 sensor every 10 days. 9 each 1   Continuous Blood Gluc Sensor (FREESTYLE LIBRE 2 SENSOR) MISC USE TO CHECK BLOOD SUGAR AS DIRECTED. (Patient not taking: Reported on 11/03/2021) 6 each 0   Continuous Blood Gluc Transmit (DEXCOM G6 TRANSMITTER) MISC Use 1 transmitter every 90 days. 1 each 1   doxycycline (VIBRA-TABS) 100 MG tablet Take 1 tablet (100 mg total) by mouth 2 (two) times daily. 20 tablet 0   glucose blood (FREESTYLE TEST STRIPS) test strip Use as instructed 100 each 12    Results for orders placed or performed during the hospital encounter of 11/11/21 (from the past 48 hour(s))  Glucose, capillary      Status: Abnormal   Collection Time: 11/11/21 12:50 PM  Result Value Ref Range   Glucose-Capillary 123 (H) 70 - 99 mg/dL    Comment: Glucose reference range applies only to samples taken after fasting for at least 8 hours.   No results found.  Review of Systems  Blood pressure 131/83, pulse (!) 59, temperature 97.6 F (36.4 C), resp. rate 11, SpO2 99 %. Physical Exam HENT:     Mouth/Throat:     Mouth: Mucous membranes are moist.     Pharynx: Oropharynx is clear.  Eyes:     General: No scleral icterus.    Conjunctiva/sclera: Conjunctivae normal.  Cardiovascular:     Rate and Rhythm: Normal rate and regular rhythm.     Heart sounds: Normal heart sounds. No murmur heard. Pulmonary:     Effort: Pulmonary effort is normal.     Breath sounds: Normal breath sounds.  Abdominal:     Comments: Abdomen is soft with mild tenderness across lower abdomen.  No guarding.  No organomegaly or masses.  Musculoskeletal:     Cervical back: Neck supple.  Lymphadenopathy:     Cervical: No cervical adenopathy.  Neurological:     Mental Status: He is alert.     Assessment/Plan  Chronic GERD complicated by short segment Barrett's esophagus and history of colonic adenoma Surveillance EGD and colonoscopy under monitored anesthesia care.  Lionel December, MD 11/11/2021, 1:25 PM

## 2021-11-11 NOTE — Op Note (Signed)
[facility] ?Patient Name: [name] ?Procedure Date: 11/11/2021 1:04 PM ?MRN: [id number] ?Date of Birth: May 05, 1961 ?Attending MD: Hildred Laser , MD ?CSN: [id number] ?Age: 61 ?Admit Type: Outpatient ?Procedure:                Upper GI endoscopy ?Indications:              Barrett's esophagus, Follow-up of Barrett's  ?                          esophagus ?Providers:                Hildred Laser, MD, Lambert Mody, [place]  ?                          Shanon Brow, Technician ?Referring MD:             Colin Broach. [name], MD ?Medicines:                Propofol per Anesthesia ?Complications:            No immediate complications. ?Estimated Blood Loss:     Estimated blood loss was minimal. ?Procedure:                Pre-Anesthesia Assessment: ?                          - Prior to the procedure, a History and Physical  ?                          was performed, and patient medications and  ?                          allergies were reviewed. The patient's tolerance of  ?                          previous anesthesia was also reviewed. The risks  ?                          and benefits of the procedure and the sedation  ?                          options and risks were discussed with the patient.  ?                          All questions were answered, and informed consent  ?                          was obtained. Prior Anticoagulants: The patient  ?                          last took Plavix (clopidogrel) 3 days prior to the  ?                          procedure. ASA Grade Assessment: III - A patient  ?  with severe systemic disease. After reviewing the  ?                          risks and benefits, the patient was deemed in  ?                          satisfactory condition to undergo the procedure. ?                          After obtaining informed consent, the endoscope was  ?                          passed under direct vision. Throughout the  ?                          procedure, the  patient's blood pressure, pulse, and  ?                          oxygen saturations were monitored continuously. The  ?                          GIF-H190 ([id number]) scope was introduced through the  ?                          mouth, and advanced to the second part of duodenum.  ?                          The upper GI endoscopy was accomplished without  ?                          difficulty. The patient tolerated the procedure  ?                          well. ?Scope In: 1:35:24 PM ?Scope Out: 1:45:54 PM ?Total Procedure Duration: 0 hours 10 minutes 30 seconds  ?Findings: ?     The hypopharynx was normal. ?     The proximal esophagus and mid esophagus were normal. ?     There were esophageal mucosal changes secondary to established  ?     short-segment Barrett's disease present in the distal esophagus. The  ?     maximum longitudinal extent of these mucosal changes was 7 cm in length.  ?     Mucosa was biopsied with a cold forceps for histology. One specimen  ?     bottle was sent to pathology. The pathology specimen was placed into  ?     Bottle Number 1. ?     The Z-line was irregular and was found 39 cm from the incisors. ?     The entire examined stomach was normal. ?     The duodenal bulb and second portion of the duodenum were normal. ?Impression:               - Normal hypopharynx. ?                          - Normal proximal esophagus and mid esophagus. ?                          -  Esophageal mucosal changes secondary to  ?                          established short-segment Barrett's disease.  ?                          Biopsied. ?                          - Z-line irregular, 39 cm from the incisors. ?                          - Normal stomach. ?                          - Normal duodenal bulb and second portion of the  ?                          duodenum. ?Moderate Sedation: ?     Per Anesthesia Care ?Recommendation:           - Patient has a contact number available for  ?                           emergencies. The signs and symptoms of potential  ?                          delayed complications were discussed with the  ?                          patient. Return to normal activities tomorrow.  ?                          Written discharge instructions were provided to the  ?                          patient. ?                          - Resume previous diet today. ?                          - Continue present medications. ?                          - Resume Plavix (clopidogrel) at prior dose  ?                          tomorrow. ?                          - Await pathology results. ?Procedure Code(s):        --- Professional --- ?                          [phone], Esophagogastroduodenoscopy, flexible,  ?  transoral; with biopsy, single or multiple ?Diagnosis Code(s):        --- Professional --- ?                          K22.70, Barrett's esophagus without dysplasia ?                          K22.8, Other specified diseases of esophagus ?CPT copyright 2019 American Medical Association. All rights reserved. ?The codes documented in this report are preliminary and upon coder review may  ?be revised to meet current compliance requirements. ?Hildred Laser, MD ?Hildred Laser, MD ?11/11/2021 2:22:52 PM ?This report has been signed electronically. ?Number of Addenda: 0 ?

## 2021-11-11 NOTE — Discharge Instructions (Addendum)
Resume clopidogrel on 11/12/2021 ?Resume other medications as before. ?Modified carb low fat and high-fiber diet. ?No driving for 24 hours. ?Physician will call with biopsy results. ?

## 2021-11-11 NOTE — Anesthesia Procedure Notes (Signed)
Date/Time: 11/11/2021 1:37 PM ?Performed by: Orlie Dakin, CRNA ?Pre-anesthesia Checklist: Patient identified, Emergency Drugs available, Suction available and Patient being monitored ?Patient Re-evaluated:Patient Re-evaluated prior to induction ?Oxygen Delivery Method: Nasal cannula ?Induction Type: IV induction ?Placement Confirmation: positive ETCO2 ? ? ? ? ?

## 2021-11-12 LAB — HM DIABETES EYE EXAM

## 2021-11-13 LAB — SURGICAL PATHOLOGY

## 2021-11-16 ENCOUNTER — Encounter (HOSPITAL_COMMUNITY): Payer: Self-pay | Admitting: Internal Medicine

## 2021-11-16 ENCOUNTER — Other Ambulatory Visit: Payer: Self-pay | Admitting: "Endocrinology

## 2021-11-16 ENCOUNTER — Other Ambulatory Visit: Payer: Self-pay | Admitting: Internal Medicine

## 2021-11-16 DIAGNOSIS — M792 Neuralgia and neuritis, unspecified: Secondary | ICD-10-CM | POA: Diagnosis not present

## 2021-11-16 DIAGNOSIS — E1142 Type 2 diabetes mellitus with diabetic polyneuropathy: Secondary | ICD-10-CM

## 2021-11-16 DIAGNOSIS — G5790 Unspecified mononeuropathy of unspecified lower limb: Secondary | ICD-10-CM | POA: Diagnosis not present

## 2021-11-16 DIAGNOSIS — M79671 Pain in right foot: Secondary | ICD-10-CM | POA: Diagnosis not present

## 2021-11-16 DIAGNOSIS — M79672 Pain in left foot: Secondary | ICD-10-CM | POA: Diagnosis not present

## 2021-11-16 MED ORDER — METFORMIN HCL ER 500 MG PO TB24: 500.0000 mg | ORAL_TABLET | Freq: Every day | ORAL | 1 refills | Status: DC

## 2021-11-16 MED ORDER — HYDROCODONE-ACETAMINOPHEN 10-325 MG PO TABS: 1.0000 | ORAL_TABLET | Freq: Four times a day (QID) | ORAL | 0 refills | Status: DC | PRN

## 2021-11-17 NOTE — Progress Notes (Signed)
?  ?Cardiology Office Note ? ? ?Date:  11/18/2021  ? ?ID:  CROSBY ORIORDAN, DOB 01/29/1961, MRN [id number] ? ?PCP:  Lindell Spar, MD  ?Cardiologist:   Minus Breeding, MD ? ? ?Chief Complaint  ?Patient presents with  ? Coronary Artery Disease  ? ? ?History of Present Illness: ?[name] is a 61 y.o. male who presents for followup of his known coronary disease.  In 2016 he had chest pain but stress perfusion study demonstrated an EF of 60% with no ischemia.  He had an echo in Jan 2021.  He had a perfusion study as below this year.  This was normal.   Since I saw him he has been treated for polycythemia rubra vera with phlebotomy.   ? ?He had 3 phlebotomies and then had his perfusion study which demonstrated no high risk findings.  This was in February.  Since his phlebotomies he is felt better. The patient denies any new symptoms such as chest discomfort, neck or arm discomfort. There has been no new shortness of breath, PND or orthopnea. There have been no reported palpitations, presyncope or syncope.  ? ? ?Past Medical History:  ?Diagnosis Date  ? Anemia   ? Bypass graft stenosis [facility])   ? '06  ? Cancer [facility])   ? Polycythemia vera  ? Cervical spinal stenosis 05/06/2020  ? C5-6 level  ? Chronic gastritis   ? Coronary artery disease   ? Patent Stent to Circ.  Patent coronary bypass grafts with a free radial graft to PDA and LIMA to the  diag.  ? Diabetes mellitus   ? Diabetic peripheral neuropathy ([place] ) 05/06/2020  ? Dyslipidemia   ? Poorly controlled, probably related to his DM (Some of this is secondary to his inability to afford medications).  ? ED (erectile dysfunction)   ? Esophageal yeast infection ([place])   ? Hypertension   ? Metaplasia of esophagus   ? Pancreatitis chronic   ? Pseudocyst  ? Polycythemia vera(238.4) 04/26/2012  ? PONV (postoperative nausea and vomiting)   ? ? ?Past Surgical History:  ?Procedure Laterality Date  ? APPENDECTOMY    ? BIOPSY  11/11/2021  ? Procedure: BIOPSY;  Surgeon: Rogene Houston, MD;  [facility];  Service: Endoscopy;;  ? CARDIAC CATHETERIZATION    ? CHOLECYSTECTOMY    ? COLONOSCOPY  07/31/1985  ? COLONOSCOPY N/A 03/11/2016  ? Procedure: COLONOSCOPY;  Surgeon: Rogene Houston, MD;  [facility];  Service: Endoscopy;  Laterality: N/A;  ? COLONOSCOPY WITH PROPOFOL N/A 11/11/2021  ? Procedure: COLONOSCOPY WITH PROPOFOL;  Surgeon: Rogene Houston, MD;  [facility];  Service: Endoscopy;  Laterality: N/A;  205  ? CORONARY ANGIOPLASTY WITH STENT PLACEMENT  2006  ? CORONARY ARTERY BYPASS GRAFT  11/24/2004  ? ESOPHAGOGASTRODUODENOSCOPY  09/19/01  ? ESOPHAGOGASTRODUODENOSCOPY N/A 03/11/2016  ? Procedure: ESOPHAGOGASTRODUODENOSCOPY (EGD);  Surgeon: Rogene Houston, MD;  [facility];  Service: Endoscopy;  Laterality: N/A;  12:00  ? ESOPHAGOGASTRODUODENOSCOPY (EGD) WITH PROPOFOL N/A 11/11/2021  ? Procedure: ESOPHAGOGASTRODUODENOSCOPY (EGD) WITH PROPOFOL;  Surgeon: Rogene Houston, MD;  [facility];  Service: Endoscopy;  Laterality: N/A;  ? FINGER TENDON REPAIR    ? in middle finger  ? KNEE SURGERY    ? left knee  ? NASAL SEPTUM SURGERY    ? POLYPECTOMY  11/11/2021  ? Procedure: POLYPECTOMY;  Surgeon: Rogene Houston, MD;  [facility];  Service: Endoscopy;;  ? ? ? ?  Current Outpatient Medications  ?Medication Sig Dispense Refill  ? Bempedoic Acid 180 MG TABS Take 180 mg by mouth daily. 30 tablet 11  ? Cholecalciferol 125 MCG (5000 UT) TABS Take 10,000 Units by mouth daily.    ? clopidogrel (PLAVIX) 75 MG tablet TAKE ONE TABLET BY MOUTH ONCE DAILY AS A BLOOD THINNER. 90 tablet 0  ? ezetimibe (ZETIA) 10 MG tablet TAKE ONE TABLET BY MOUTH ONCE DAILY. 90 tablet 0  ? fenofibrate micronized (LOFIBRA) 67 MG capsule Take 1 capsule (67 mg total) by mouth daily before breakfast. 90 capsule 4  ? gabapentin (NEURONTIN) 400 MG capsule TAKE (1) CAPSULE BY MOUTH TWICE DAILY. (Patient taking differently: Take 400 mg by mouth 3 (three) times  daily as needed (neuropathy).) 180 capsule 0  ? hydrochlorothiazide (HYDRODIURIL) 25 MG tablet TAKE (1) TABLET BY MOUTH TWICE DAILY FOR HIGH BLOOD PRESSURE. (Patient taking differently: Take 25 mg by mouth in the morning.) 180 tablet 0  ? HYDROcodone-acetaminophen (NORCO) 10-325 MG tablet Take 1 tablet by mouth every 6 (six) hours as needed for severe pain. 120 tablet 0  ? icosapent Ethyl (VASCEPA) 1 g capsule Take 2 capsules (2 g total) by mouth 2 (two) times daily. 360 capsule 3  ? insulin NPH-regular Human (NOVOLIN 70/30) (70-30) 100 UNIT/ML injection Inject 50 Units into the skin 2 (two) times daily before a meal. When glucose is above 90 and eating    ? lansoprazole (PREVACID) 30 MG capsule Take 1 capsule (30 mg total) by mouth 2 (two) times daily before a meal. (Patient taking differently: Take 30 mg by mouth daily as needed (acid reflux).) 60 capsule 2  ? losartan (COZAAR) 50 MG tablet Take 1 tablet (50 mg total) by mouth daily. (Patient taking differently: Take 25 mg by mouth at bedtime.) 90 tablet 3  ? metFORMIN (GLUCOPHAGE-XR) 500 MG 24 hr tablet Take 1 tablet (500 mg total) by mouth daily with breakfast. 90 tablet 1  ? methocarbamol (ROBAXIN) 500 MG tablet Take 1 tablet (500 mg total) by mouth every 8 (eight) hours as needed for muscle spasms. 30 tablet 2  ? metoprolol tartrate (LOPRESSOR) 25 MG tablet TAKE 1 TABLET BY MOUTH TWICE A DAY. 60 tablet 5  ? nitroGLYCERIN (NITROSTAT) 0.4 MG SL tablet Place 0.4 mg under the tongue every 5 (five) minutes as needed for chest pain. Reported on 02/26/2016    ? NON FORMULARY Take 20 mg by mouth daily. Domperidone (Motilium) 10 mg    ? NONFORMULARY OR COMPOUNDED ITEM Apply 1 application. topically daily as needed (neuropathy). Compounded cream for foot pain. Gets at [employer].    ? ondansetron (ZOFRAN) 4 MG tablet Take 4 mg by mouth every 8 (eight) hours as needed for nausea or vomiting.    ? PARoxetine (PAXIL) 10 MG tablet TAKE ONE TABLET BY MOUTH ONCE DAILY FOR  DEPRESSION. 90 tablet 0  ? polyethylene glycol (MIRALAX / GLYCOLAX) 17 g packet Take 17 g by mouth daily as needed for moderate constipation.    ? promethazine (PHENERGAN) 25 MG tablet Take 25 mg by mouth every 6 (six) hours as needed for nausea or vomiting. Take 1/4 to 1/2 tablet by mouth as needed    ? tamsulosin (FLOMAX) 0.4 MG CAPS capsule Take 0.4 mg by mouth daily.    ? alfuzosin (UROXATRAL) 10 MG 24 hr tablet Take 1 tablet (10 mg total) by mouth daily with breakfast. (Patient not taking: Reported on 11/03/2021) 30 tablet 11  ? Continuous Blood Gluc  Receiver (DEXCOM G6 RECEIVER) DEVI Use to check blood glucose 4 times daily. 1 each 0  ? Continuous Blood Gluc Sensor (DEXCOM G6 SENSOR) MISC Use 1 sensor every 10 days. 9 each 1  ? Continuous Blood Gluc Sensor (FREESTYLE LIBRE 2 SENSOR) MISC USE TO CHECK BLOOD SUGAR AS DIRECTED. (Patient not taking: Reported on 11/03/2021) 6 each 0  ? Continuous Blood Gluc Transmit (DEXCOM G6 TRANSMITTER) MISC Use 1 transmitter every 90 days. 1 each 1  ? diazepam (VALIUM) 10 MG tablet Take 5 mg by mouth at bedtime as needed for anxiety or sleep. (Patient not taking: Reported on 11/18/2021)    ? doxycycline (VIBRA-TABS) 100 MG tablet Take 1 tablet (100 mg total) by mouth 2 (two) times daily. (Patient not taking: Reported on 11/18/2021) 20 tablet 0  ? glucose blood (FREESTYLE TEST STRIPS) test strip Use as instructed 100 each 12  ? ?No current facility-administered medications for this visit.  ? ? ?Allergies:   Iodinated contrast media, Iohexol, Metoclopramide hcl, Alfuzosin hcl er, Sulfonamide derivatives, Enalapril, Penicillins, and Sulfa antibiotics  ? ? ?ROS:  Please see the history of present illness.   Otherwise, review of systems are positive for none    All other systems are reviewed and negative.  ? ? ?PHYSICAL EXAM: ?VS:  BP (!) 150/98   Pulse 68   Ht 5' 9.5" (1.765 m)   Wt 179 lb (81.2 kg)   BMI 26.05 kg/m?  , BMI Body mass index is 26.05 kg/m?. ?GENERAL:  Well  appearing ?NECK:  No jugular venous distention, waveform within normal limits, carotid upstroke brisk and symmetric, no bruits, no thyromegaly ?LUNGS:  Clear to auscultation bilaterally ?CHEST:  Well healed sternotomy

## 2021-11-18 ENCOUNTER — Encounter: Payer: Self-pay | Admitting: Cardiology

## 2021-11-18 ENCOUNTER — Ambulatory Visit (INDEPENDENT_AMBULATORY_CARE_PROVIDER_SITE_OTHER): Payer: Medicare Other | Admitting: Cardiology

## 2021-11-18 VITALS — BP 150/98 | HR 68 | Ht 69.5 in | Wt 179.0 lb

## 2021-11-18 DIAGNOSIS — E785 Hyperlipidemia, unspecified: Secondary | ICD-10-CM

## 2021-11-18 DIAGNOSIS — I2510 Atherosclerotic heart disease of native coronary artery without angina pectoris: Secondary | ICD-10-CM

## 2021-11-18 DIAGNOSIS — I10 Essential (primary) hypertension: Secondary | ICD-10-CM

## 2021-11-18 DIAGNOSIS — Z794 Long term (current) use of insulin: Secondary | ICD-10-CM

## 2021-11-18 DIAGNOSIS — E118 Type 2 diabetes mellitus with unspecified complications: Secondary | ICD-10-CM

## 2021-11-18 MED ORDER — BEMPEDOIC ACID 180 MG PO TABS
180.0000 mg | ORAL_TABLET | Freq: Every day | ORAL | 11 refills | Status: DC
Start: 2021-11-18 — End: 2023-01-03

## 2021-11-18 NOTE — Patient Instructions (Addendum)
Medication Instructions:  ?Your physician has recommended you make the following change in your medication:  ?START Bempedoic Acid (Nexletol) 180 mg once daily ? ?*If you need a refill on your cardiac medications before your next appointment, please call your pharmacy* ? ? ?Lab Work: ?Your physician recommends that you return for lab work in: 10 weeks for FASTING lipid blood work.  You can stop by a [facility] for this ? ? ?Testing/Procedures: ?None ordered ? ? ?Follow-Up: ?At [facility], you and your health needs are our priority.  As part of our continuing mission to provide you with exceptional heart care, we have created designated Provider Care Teams.  These Care Teams include your primary Cardiologist (physician) and Advanced Practice Providers (APPs -  Physician Assistants and Nurse Practitioners) who all work together to provide you with the care you need, when you need it. ? ?Your next appointment:   ?1 year(s) ? ?The format for your next appointment:   ?In Person ? ?Provider:   ?Minus Breeding, MD ? ? ? ?Thank you for choosing [facility]!! ? ? ?[phone] ? ? ?Other Instructions ? ?  ?

## 2021-11-23 ENCOUNTER — Telehealth: Payer: Self-pay | Admitting: *Deleted

## 2021-11-23 NOTE — Telephone Encounter (Signed)
Coverage for nexletol has been approved. ?

## 2021-11-26 ENCOUNTER — Ambulatory Visit: Payer: Medicare Other | Admitting: Nutrition

## 2021-11-26 ENCOUNTER — Ambulatory Visit: Payer: PPO | Admitting: "Endocrinology

## 2021-12-08 ENCOUNTER — Other Ambulatory Visit: Payer: Self-pay | Admitting: Internal Medicine

## 2021-12-08 DIAGNOSIS — M25511 Pain in right shoulder: Secondary | ICD-10-CM

## 2021-12-08 DIAGNOSIS — E1140 Type 2 diabetes mellitus with diabetic neuropathy, unspecified: Secondary | ICD-10-CM | POA: Diagnosis not present

## 2021-12-10 ENCOUNTER — Ambulatory Visit: Payer: Medicare Other | Admitting: Urology

## 2021-12-10 ENCOUNTER — Encounter: Payer: Self-pay | Admitting: Urology

## 2021-12-16 ENCOUNTER — Encounter (INDEPENDENT_AMBULATORY_CARE_PROVIDER_SITE_OTHER): Payer: Self-pay | Admitting: Internal Medicine

## 2021-12-16 ENCOUNTER — Telehealth (INDEPENDENT_AMBULATORY_CARE_PROVIDER_SITE_OTHER): Payer: Self-pay

## 2021-12-16 NOTE — Telephone Encounter (Signed)
I called and left a message asked that the patient please return call to clarify if one per day or prn is working for him. ? ?Insurance will only pay for Lansoprazole 30 mg one po Qd, no longer will pay for BID. Per [employer] patient ok with one per day dosing. Per Chart patient takes one po prn.  ?

## 2021-12-17 ENCOUNTER — Other Ambulatory Visit (INDEPENDENT_AMBULATORY_CARE_PROVIDER_SITE_OTHER): Payer: Self-pay

## 2021-12-17 DIAGNOSIS — K2270 Barrett's esophagus without dysplasia: Secondary | ICD-10-CM

## 2021-12-17 DIAGNOSIS — K219 Gastro-esophageal reflux disease without esophagitis: Secondary | ICD-10-CM

## 2021-12-17 MED ORDER — LANSOPRAZOLE 30 MG PO CPDR: 30.0000 mg | DELAYED_RELEASE_CAPSULE | Freq: Every day | ORAL | 3 refills | Status: DC

## 2021-12-17 NOTE — Telephone Encounter (Signed)
Yes I was calling about your Lansoprazole. Per [employer] will not cover two times per day dosing. They will only pay for one per day. But it is my understanding you take this as needed anyway. Are you ok with filling the prescription for one per day? Please advise. ?===View-only below this line=== ? ? ?----- Message ----- ?     From:Jona W Zenia Resides ?     Sent:12/16/2021  5:30 PM EDT ?       WE:[email or web address] Rehman, MD ?  Subject:I returned your call ? ?I'm sending message you had called me and briefly stepped away from the phone when I call back I could not get anyone so I left a message for you to call me back I'm pretty sure this is in reference to the prevacid ?I had called into the pharmacy. Please reply back here or contact me on [phone] ?Thanks Duard Larsen ?

## 2021-12-17 NOTE — Telephone Encounter (Signed)
Patient states he is doing well on one po Q am, May we switch the prescription to one per day. ?

## 2021-12-17 NOTE — Telephone Encounter (Signed)
Per Dr. Laural Golden ok to send in one per day # 12 with 3 refills. I have submited to [place] and sent patient a message through [facility] that it was done and where we sent It. ?

## 2021-12-21 DIAGNOSIS — T466X5A Adverse effect of antihyperlipidemic and antiarteriosclerotic drugs, initial encounter: Secondary | ICD-10-CM

## 2021-12-21 NOTE — Progress Notes (Signed)
[facility])  ?                                          [facility] Quality Pharmacy Team ?  ? ?12/21/2021 ? ?[name] ?Apr 20, 1961 ?[id number] ? ?Dear Dr. [name], ? ?I am a [facility] clinical pharmacist that reviews patients for statin quality initiatives.    ? ?Per review of chart and payor information, patient has a diagnosis of both diabetes and ASCVD, but is not currently filling a statin prescription as a result of myopathy. There is  ezetimibe and bempedoic acid  on file. This places patient into the SUPD (Statin Use in Patients with Diabetes) & SPC (Statin use for Patients with Cardiovascular Disease)  measure for CMS.  ? ?Last year, dx code statin intolerance (Z78.9)  was added to the patient's encounter. Next appointment is on 12/22/2021. If deemed therapeutically appropriate, please consider associating exclusion code (see options below) to remove the patient from this year's measure.  ? ?Drug Induced Myopathy G72.0 ?Myopathy, unspecified G72.9 ?Myositis, unspecified M60.9 ?Rhabdomyolysis M62.82 ?Cirrhosis of liver K74.69 ?Prediabetes R73.03 ?PCOS E28.2 ? ?Thank you for allowing pharmacy to be a part of this patient?s care. ? ?Pharmacist Name: Geryl Councilman ?Clinical Pharmacist ?[place] ?[phone] ? ?

## 2021-12-22 ENCOUNTER — Encounter: Payer: Self-pay | Admitting: Internal Medicine

## 2021-12-22 ENCOUNTER — Ambulatory Visit (INDEPENDENT_AMBULATORY_CARE_PROVIDER_SITE_OTHER): Payer: Medicare Other | Admitting: Internal Medicine

## 2021-12-22 VITALS — BP 138/82 | HR 70 | Resp 18 | Ht 69.5 in | Wt 178.4 lb

## 2021-12-22 DIAGNOSIS — I7381 Erythromelalgia: Secondary | ICD-10-CM

## 2021-12-22 DIAGNOSIS — N401 Enlarged prostate with lower urinary tract symptoms: Secondary | ICD-10-CM | POA: Diagnosis not present

## 2021-12-22 DIAGNOSIS — E1140 Type 2 diabetes mellitus with diabetic neuropathy, unspecified: Secondary | ICD-10-CM

## 2021-12-22 DIAGNOSIS — R3912 Poor urinary stream: Secondary | ICD-10-CM

## 2021-12-22 DIAGNOSIS — E782 Mixed hyperlipidemia: Secondary | ICD-10-CM

## 2021-12-22 DIAGNOSIS — Z794 Long term (current) use of insulin: Secondary | ICD-10-CM

## 2021-12-22 DIAGNOSIS — E1142 Type 2 diabetes mellitus with diabetic polyneuropathy: Secondary | ICD-10-CM

## 2021-12-22 DIAGNOSIS — K861 Other chronic pancreatitis: Secondary | ICD-10-CM

## 2021-12-22 DIAGNOSIS — M609 Myositis, unspecified: Secondary | ICD-10-CM

## 2021-12-22 DIAGNOSIS — T466X5A Adverse effect of antihyperlipidemic and antiarteriosclerotic drugs, initial encounter: Secondary | ICD-10-CM

## 2021-12-22 DIAGNOSIS — Z0001 Encounter for general adult medical examination with abnormal findings: Secondary | ICD-10-CM | POA: Diagnosis not present

## 2021-12-22 DIAGNOSIS — I25118 Atherosclerotic heart disease of native coronary artery with other forms of angina pectoris: Secondary | ICD-10-CM

## 2021-12-22 MED ORDER — GABAPENTIN 600 MG PO TABS: 600.0000 mg | ORAL_TABLET | Freq: Three times a day (TID) | ORAL | 1 refills | Status: DC

## 2021-12-22 NOTE — Patient Instructions (Addendum)
Please start taking Gabapentin 600 mg instead of 400 mg as discussed. ? ?Please continue taking medications as prescribed. ? ?Please continue to follow low carb diet and perform moderate exercise/walking at least 150 mins/week. ? ?Please consider getting Shingrix and Tdap vaccines at your local pharmacy. ?

## 2021-12-22 NOTE — Progress Notes (Signed)
? ?Established Patient Office Visit ? ?Subjective:  ?Patient ID: [name], male    DOB: Jan 09, 1961  Age: 61 y.o. MRN: [id number] ? ?CC:  ?Chief Complaint  ?Patient presents with  ? Annual Exam  ?  Annual exam pt still having neuropathy pain this is worse   ? ? ?HPI ?[name] is a 61 y.o. male with past medical history of CAD status post CABG, hypertension, uncontrolled diabetes mellitus-on insulin, severe diabetic neuropathy, chronic pancreatitis, polycythemia vera, hyperlipidemia, cervical spinal stenosis and chronic back pain who presents for annual physical. ? ?HTN: BP is better controlled now. Takes medications regularly. Patient denies headache, dizziness, chest pain, dyspnea or palpitations. ?  ?DM: HbA1C is 8.8 now. He had visit with Dr Dorris Fetch and his insulin dose was increased to 50 U BID. He also takes Metformin. Denies any polyuria or polyphagia currently.  He has CGM device, and his blood glucose runs around 150-200 most of the time. ?  ?Diabetic neuropathy: Takes gabapentin and as needed Norco for severe pain.  He has tried gabapentin cream as well for neuropathy, but continues to have severe burning pain in his feet. ? ? ? ? ?Past Medical History:  ?Diagnosis Date  ? Anemia   ? Bypass graft stenosis [facility])   ? '06  ? Cancer [facility])   ? Polycythemia vera  ? Cervical spinal stenosis 05/06/2020  ? C5-6 level  ? Chronic gastritis   ? Coronary artery disease   ? Patent Stent to Circ.  Patent coronary bypass grafts with a free radial graft to PDA and LIMA to the  diag.  ? Diabetes mellitus   ? Diabetic peripheral neuropathy ([place]) 05/06/2020  ? Dyslipidemia   ? Poorly controlled, probably related to his DM (Some of this is secondary to his inability to afford medications).  ? ED (erectile dysfunction)   ? Esophageal yeast infection ([place])   ? Hypertension   ? Metaplasia of esophagus   ? Pancreatitis chronic   ? Pseudocyst  ? Polycythemia vera(238.4) 04/26/2012  ? PONV (postoperative nausea and vomiting)    ? ? ?Past Surgical History:  ?Procedure Laterality Date  ? APPENDECTOMY    ? BIOPSY  11/11/2021  ? Procedure: BIOPSY;  Surgeon: Rogene Houston, MD;  Location: [facility];  Service: Endoscopy;;  ? CARDIAC CATHETERIZATION    ? CHOLECYSTECTOMY    ? COLONOSCOPY  07/31/1985  ? COLONOSCOPY N/A 03/11/2016  ? Procedure: COLONOSCOPY;  Surgeon: Rogene Houston, MD;  Location: [facility];  Service: Endoscopy;  Laterality: N/A;  ? COLONOSCOPY WITH PROPOFOL N/A 11/11/2021  ? Procedure: COLONOSCOPY WITH PROPOFOL;  Surgeon: Rogene Houston, MD;  Location: [facility];  Service: Endoscopy;  Laterality: N/A;  205  ? CORONARY ANGIOPLASTY WITH STENT PLACEMENT  2006  ? CORONARY ARTERY BYPASS GRAFT  11/24/2004  ? ESOPHAGOGASTRODUODENOSCOPY  09/19/01  ? ESOPHAGOGASTRODUODENOSCOPY N/A 03/11/2016  ? Procedure: ESOPHAGOGASTRODUODENOSCOPY (EGD);  Surgeon: Rogene Houston, MD;  Location: [facility];  Service: Endoscopy;  Laterality: N/A;  12:00  ? ESOPHAGOGASTRODUODENOSCOPY (EGD) WITH PROPOFOL N/A 11/11/2021  ? Procedure: ESOPHAGOGASTRODUODENOSCOPY (EGD) WITH PROPOFOL;  Surgeon: Rogene Houston, MD;  Location: [facility];  Service: Endoscopy;  Laterality: N/A;  ? FINGER TENDON REPAIR    ? in middle finger  ? KNEE SURGERY    ? left knee  ? NASAL SEPTUM SURGERY    ? POLYPECTOMY  11/11/2021  ? Procedure: POLYPECTOMY;  Surgeon: Rogene Houston, MD;  Location: [facility]  END[facility];  Service: Endoscopy;;  ? ? ?Family History  ?Problem Relation Age of Onset  ? Coronary artery disease Mother   ?     strong family history of  ? Heart attack Mother   ?     dying of (at age 25)  ? Diabetes Mother   ? Diabetes Sister   ? Diabetes Brother   ? Pancreatitis Brother   ? Healthy Daughter   ? Hyperlipidemia Daughter   ? Heart disease Brother   ? Hypertension Brother   ? Hyperlipidemia Brother   ? ? ?Social History  ? ?Socioeconomic History  ? Marital status: Married  ?  Spouse name: Not on file  ? Number of children: Not on file  ? Years of education:  Not on file  ? Highest education level: Not on file  ?Occupational History  ? Not on file  ?Tobacco Use  ? Smoking status: Never  ? Smokeless tobacco: Never  ?Substance and Sexual Activity  ? Alcohol use: Not Currently  ?  Comment: Patient may drink a beer or glass of wine a couple times a month  ? Drug use: No  ? Sexual activity: Not on file  ?Other Topics Concern  ? Not on file  ?Social History Narrative  ? The patient lives in [place] with his wife.  He is   ?  disabled secondary to coronary artery disease and pancreatitis.  He does   ?  not smoke, he does not drink alcohol.  There is no drug use.   ? ?Social Determinants of Health  ? ?Financial Resource Strain: Low Risk   ? Difficulty of Paying Living Expenses: Not hard at all  ?Food Insecurity: No Food Insecurity  ? Worried About Charity fundraiser in the Last Year: Never true  ? Ran Out of Food in the Last Year: Never true  ?Transportation Needs: No Transportation Needs  ? Lack of Transportation (Medical): No  ? Lack of Transportation (Non-Medical): No  ?Physical Activity: Inactive  ? Days of Exercise per Week: 0 days  ? Minutes of Exercise per Session: 0 min  ?Stress: No Stress Concern Present  ? Feeling of Stress : Not at all  ?Social Connections: Moderately Integrated  ? Frequency of Communication with Friends and Family: Three times a week  ? Frequency of Social Gatherings with Friends and Family: Not on file  ? Attends Religious Services: More than 4 times per year  ? Active Member of Clubs or Organizations: No  ? Attends Archivist Meetings: Never  ? Marital Status: Married  ?Intimate Partner Violence: Not on file  ? ? ?Outpatient Medications Prior to Visit  ?Medication Sig Dispense Refill  ? Cholecalciferol 125 MCG (5000 UT) TABS Take 10,000 Units by mouth daily.    ? clopidogrel (PLAVIX) 75 MG tablet TAKE ONE TABLET BY MOUTH ONCE DAILY AS A BLOOD THINNER. 90 tablet 0  ? Continuous Blood Gluc Receiver (DEXCOM G6 RECEIVER) DEVI Use to check  blood glucose 4 times daily. 1 each 0  ? Continuous Blood Gluc Sensor (DEXCOM G6 SENSOR) MISC Use 1 sensor every 10 days. 9 each 1  ? Continuous Blood Gluc Sensor (FREESTYLE LIBRE 2 SENSOR) MISC USE TO CHECK BLOOD SUGAR AS DIRECTED. 6 each 0  ? Continuous Blood Gluc Transmit (DEXCOM G6 TRANSMITTER) MISC Use 1 transmitter every 90 days. 1 each 1  ? diazepam (VALIUM) 10 MG tablet Take 5 mg by mouth at bedtime as needed for anxiety or sleep.    ?  ezetimibe (ZETIA) 10 MG tablet TAKE ONE TABLET BY MOUTH ONCE DAILY. 90 tablet 0  ? fenofibrate micronized (LOFIBRA) 67 MG capsule Take 1 capsule (67 mg total) by mouth daily before breakfast. 90 capsule 4  ? glucose blood (FREESTYLE TEST STRIPS) test strip Use as instructed 100 each 12  ? hydrochlorothiazide (HYDRODIURIL) 25 MG tablet TAKE (1) TABLET BY MOUTH TWICE DAILY FOR HIGH BLOOD PRESSURE. (Patient taking differently: Take 25 mg by mouth in the morning.) 180 tablet 0  ? HYDROcodone-acetaminophen (NORCO) 10-325 MG tablet Take 1 tablet by mouth every 6 (six) hours as needed for severe pain. 120 tablet 0  ? icosapent Ethyl (VASCEPA) 1 g capsule Take 2 capsules (2 g total) by mouth 2 (two) times daily. 360 capsule 3  ? insulin NPH-regular Human (NOVOLIN 70/30) (70-30) 100 UNIT/ML injection Inject 50 Units into the skin 2 (two) times daily before a meal. When glucose is above 90 and eating    ? lansoprazole (PREVACID) 30 MG capsule Take 1 capsule (30 mg total) by mouth daily at 12 noon. 90 capsule 3  ? metFORMIN (GLUCOPHAGE-XR) 500 MG 24 hr tablet Take 1 tablet (500 mg total) by mouth daily with breakfast. 90 tablet 1  ? methocarbamol (ROBAXIN) 500 MG tablet TAKE 1 TABLET BY MOUTH EVERY 8 HOURS AS NEEDED FOR MUSCLE SPASMS. 30 tablet 0  ? metoprolol tartrate (LOPRESSOR) 25 MG tablet TAKE 1 TABLET BY MOUTH TWICE A DAY. 60 tablet 5  ? nitroGLYCERIN (NITROSTAT) 0.4 MG SL tablet Place 0.4 mg under the tongue every 5 (five) minutes as needed for chest pain. Reported on 02/26/2016     ? NON FORMULARY Take 20 mg by mouth daily. Domperidone (Motilium) 10 mg    ? NONFORMULARY OR COMPOUNDED ITEM Apply 1 application. topically daily as needed (neuropathy). Compounded cream for foot pa

## 2021-12-24 DIAGNOSIS — M79642 Pain in left hand: Secondary | ICD-10-CM | POA: Diagnosis not present

## 2021-12-24 DIAGNOSIS — M65312 Trigger thumb, left thumb: Secondary | ICD-10-CM | POA: Diagnosis not present

## 2021-12-25 DIAGNOSIS — Z0001 Encounter for general adult medical examination with abnormal findings: Secondary | ICD-10-CM | POA: Insufficient documentation

## 2021-12-25 NOTE — Assessment & Plan Note (Signed)
JAK2 negative ?Gets venipuncture every 3-4 months ?F/u Heme/Onc. ?

## 2021-12-25 NOTE — Assessment & Plan Note (Signed)
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 Laural Golden ?

## 2021-12-25 NOTE — Assessment & Plan Note (Signed)
Has been evaluated by Urology ?Has stopped taking Flomax and Uroxatrol as he had fatigue and dizziness with them ?

## 2021-12-25 NOTE — Assessment & Plan Note (Signed)
On Plavix and B-blocker ?Nitroglycerin PRN for chest pain ?Follows up with Cardiologist ?

## 2021-12-25 NOTE — Assessment & Plan Note (Signed)

## 2021-12-25 NOTE — Assessment & Plan Note (Signed)
Lab Results  ?Component Value Date  ? HGBA1C 8.6 (H) 09/29/2021  ? ?On Novolin 50 U BID and Metformin, f/u with Dr Dorris Fetch ?Advised to follow diabetic diet ?Not on any statin due to statin intolerance ?Diabetic eye exam: Advised to follow up with Ophthalmology for diabetic eye exam ?

## 2021-12-25 NOTE — Assessment & Plan Note (Signed)
Had severe myalgia and worsening of neuropathy pain with statin ?Has tried 2 different statins in the past. ?

## 2021-12-25 NOTE — Assessment & Plan Note (Signed)
Long-standing, severe b/l LE pain ?On Gabapentin 400 mg TID and Norco PRN ?Still uncontrolled, increased dose of Gabapentin to 600 mg TID ?SSRI and Valium for chronic pain and anxiety related to neuropathy ?Has Gabapentin cream for neuropathy ?

## 2021-12-31 ENCOUNTER — Other Ambulatory Visit: Payer: Self-pay | Admitting: Internal Medicine

## 2021-12-31 ENCOUNTER — Encounter: Payer: Self-pay | Admitting: Internal Medicine

## 2021-12-31 ENCOUNTER — Telehealth: Payer: Self-pay | Admitting: "Endocrinology

## 2021-12-31 DIAGNOSIS — E1142 Type 2 diabetes mellitus with diabetic polyneuropathy: Secondary | ICD-10-CM

## 2021-12-31 DIAGNOSIS — K8681 Exocrine pancreatic insufficiency: Secondary | ICD-10-CM

## 2021-12-31 DIAGNOSIS — E782 Mixed hyperlipidemia: Secondary | ICD-10-CM

## 2021-12-31 MED ORDER — DEXCOM G7 RECEIVER DEVI: 0 refills | Status: DC

## 2021-12-31 MED ORDER — DEXCOM G7 SENSOR MISC: 2 refills | Status: DC

## 2021-12-31 NOTE — Telephone Encounter (Signed)
Pt called and is requesting a Dexcom 7 supplies sent to his pharmacy. ? ?[place], [place] - [address] Phone:  [phone]  ?Fax:  [phone]  ?  ? ? ? ?Also patient needs updated lab orders for [facility]. ? ? ? ?

## 2021-12-31 NOTE — Telephone Encounter (Signed)
Rx's sent and lab orders updated.  ?

## 2022-01-01 ENCOUNTER — Other Ambulatory Visit: Payer: Self-pay | Admitting: Internal Medicine

## 2022-01-01 DIAGNOSIS — E1142 Type 2 diabetes mellitus with diabetic polyneuropathy: Secondary | ICD-10-CM

## 2022-01-01 MED ORDER — GABAPENTIN 300 MG PO CAPS: 600.0000 mg | ORAL_CAPSULE | Freq: Three times a day (TID) | ORAL | 1 refills | Status: DC

## 2022-01-04 ENCOUNTER — Other Ambulatory Visit: Payer: Self-pay | Admitting: *Deleted

## 2022-01-04 ENCOUNTER — Other Ambulatory Visit: Payer: Self-pay | Admitting: Internal Medicine

## 2022-01-04 DIAGNOSIS — E782 Mixed hyperlipidemia: Secondary | ICD-10-CM

## 2022-01-04 DIAGNOSIS — G4701 Insomnia due to medical condition: Secondary | ICD-10-CM

## 2022-01-04 MED ORDER — DIAZEPAM 10 MG PO TABS: 5.0000 mg | ORAL_TABLET | Freq: Every evening | ORAL | 0 refills | Status: DC | PRN

## 2022-01-04 MED ORDER — EZETIMIBE 10 MG PO TABS: 10.0000 mg | ORAL_TABLET | Freq: Every day | ORAL | 0 refills | Status: DC

## 2022-01-05 DIAGNOSIS — I7381 Erythromelalgia: Secondary | ICD-10-CM | POA: Diagnosis not present

## 2022-01-05 DIAGNOSIS — K8681 Exocrine pancreatic insufficiency: Secondary | ICD-10-CM | POA: Diagnosis not present

## 2022-01-05 DIAGNOSIS — E1140 Type 2 diabetes mellitus with diabetic neuropathy, unspecified: Secondary | ICD-10-CM | POA: Diagnosis not present

## 2022-01-05 DIAGNOSIS — Z794 Long term (current) use of insulin: Secondary | ICD-10-CM | POA: Diagnosis not present

## 2022-01-05 DIAGNOSIS — E782 Mixed hyperlipidemia: Secondary | ICD-10-CM | POA: Diagnosis not present

## 2022-01-06 LAB — COMPREHENSIVE METABOLIC PANEL
ALT: 18 IU/L (ref 0–44)
AST: 25 IU/L (ref 0–40)
Albumin/Globulin Ratio: 1.8 (ref 1.2–2.2)
Albumin: 4.3 g/dL (ref 3.8–4.9)
Alkaline Phosphatase: 60 IU/L (ref 44–121)
BUN/Creatinine Ratio: 17 (ref 9–20)
BUN: 28 mg/dL — ABNORMAL HIGH (ref 6–24)
Bilirubin Total: 0.4 mg/dL (ref 0.0–1.2)
CO2: 23 mmol/L (ref 20–29)
Calcium: 9.5 mg/dL (ref 8.7–10.2)
Chloride: 100 mmol/L (ref 96–106)
Creatinine, Ser: 1.69 mg/dL — ABNORMAL HIGH (ref 0.76–1.27)
Globulin, Total: 2.4 g/dL (ref 1.5–4.5)
Glucose: 215 mg/dL — ABNORMAL HIGH (ref 70–99)
Potassium: 4.7 mmol/L (ref 3.5–5.2)
Sodium: 138 mmol/L (ref 134–144)
Total Protein: 6.7 g/dL (ref 6.0–8.5)
eGFR: 46 mL/min/{1.73_m2} — ABNORMAL LOW (ref >59)

## 2022-01-06 LAB — LIPID PANEL
Chol/HDL Ratio: 5.3 ratio — ABNORMAL HIGH (ref 0.0–5.0)
Cholesterol, Total: 212 mg/dL — ABNORMAL HIGH (ref 100–199)
HDL: 40 mg/dL (ref >39)
LDL Chol Calc (NIH): 113 mg/dL — ABNORMAL HIGH (ref 0–99)
Triglycerides: 343 mg/dL — ABNORMAL HIGH (ref 0–149)
VLDL Cholesterol Cal: 59 mg/dL — ABNORMAL HIGH (ref 5–40)

## 2022-01-06 LAB — TSH: TSH: 2.31 u[IU]/mL (ref 0.450–4.500)

## 2022-01-06 LAB — T4, FREE: Free T4: 1.21 ng/dL (ref 0.82–1.77)

## 2022-01-07 ENCOUNTER — Ambulatory Visit: Payer: Medicare Other | Admitting: Urology

## 2022-01-07 ENCOUNTER — Encounter: Payer: Self-pay | Admitting: Urology

## 2022-01-07 VITALS — BP 153/97 | HR 66 | Ht 69.5 in | Wt 179.5 lb

## 2022-01-07 DIAGNOSIS — M79671 Pain in right foot: Secondary | ICD-10-CM | POA: Diagnosis not present

## 2022-01-07 DIAGNOSIS — R39198 Other difficulties with micturition: Secondary | ICD-10-CM | POA: Diagnosis not present

## 2022-01-07 DIAGNOSIS — R351 Nocturia: Secondary | ICD-10-CM | POA: Diagnosis not present

## 2022-01-07 DIAGNOSIS — N50812 Left testicular pain: Secondary | ICD-10-CM

## 2022-01-07 DIAGNOSIS — M79672 Pain in left foot: Secondary | ICD-10-CM | POA: Diagnosis not present

## 2022-01-07 DIAGNOSIS — N401 Enlarged prostate with lower urinary tract symptoms: Secondary | ICD-10-CM | POA: Diagnosis not present

## 2022-01-07 DIAGNOSIS — N5201 Erectile dysfunction due to arterial insufficiency: Secondary | ICD-10-CM

## 2022-01-07 DIAGNOSIS — G5790 Unspecified mononeuropathy of unspecified lower limb: Secondary | ICD-10-CM | POA: Diagnosis not present

## 2022-01-07 DIAGNOSIS — M792 Neuralgia and neuritis, unspecified: Secondary | ICD-10-CM | POA: Diagnosis not present

## 2022-01-07 DIAGNOSIS — E1140 Type 2 diabetes mellitus with diabetic neuropathy, unspecified: Secondary | ICD-10-CM | POA: Diagnosis not present

## 2022-01-07 DIAGNOSIS — N138 Other obstructive and reflux uropathy: Secondary | ICD-10-CM | POA: Diagnosis not present

## 2022-01-07 LAB — CBC WITH DIFFERENTIAL/PLATELET
Basophils Absolute: 0 10*3/uL (ref 0.0–0.2)
Basos: 1 %
EOS (ABSOLUTE): 0.2 10*3/uL (ref 0.0–0.4)
Eos: 4 %
Hematocrit: 43.1 % (ref 37.5–51.0)
Hemoglobin: 14 g/dL (ref 13.0–17.7)
Immature Grans (Abs): 0 10*3/uL (ref 0.0–0.1)
Immature Granulocytes: 0 %
Lymphocytes Absolute: 1.6 10*3/uL (ref 0.7–3.1)
Lymphs: 28 %
MCH: 25 pg — ABNORMAL LOW (ref 26.6–33.0)
MCHC: 32.5 g/dL (ref 31.5–35.7)
MCV: 77 fL — ABNORMAL LOW (ref 79–97)
Monocytes Absolute: 0.5 10*3/uL (ref 0.1–0.9)
Monocytes: 9 %
Neutrophils Absolute: 3.4 10*3/uL (ref 1.4–7.0)
Neutrophils: 58 %
Platelets: 219 10*3/uL (ref 150–450)
RBC: 5.59 x10E6/uL (ref 4.14–5.80)
RDW: 16.7 % — ABNORMAL HIGH (ref 11.6–15.4)
WBC: 5.8 10*3/uL (ref 3.4–10.8)

## 2022-01-07 LAB — CMP14+EGFR
ALT: 18 IU/L (ref 0–44)
AST: 26 IU/L (ref 0–40)
Albumin/Globulin Ratio: 1.9 (ref 1.2–2.2)
Albumin: 4.3 g/dL (ref 3.8–4.9)
Alkaline Phosphatase: 57 IU/L (ref 44–121)
BUN/Creatinine Ratio: 15 (ref 9–20)
BUN: 25 mg/dL — ABNORMAL HIGH (ref 6–24)
Bilirubin Total: 0.4 mg/dL (ref 0.0–1.2)
CO2: 23 mmol/L (ref 20–29)
Calcium: 9.1 mg/dL (ref 8.7–10.2)
Chloride: 97 mmol/L (ref 96–106)
Creatinine, Ser: 1.65 mg/dL — ABNORMAL HIGH (ref 0.76–1.27)
Globulin, Total: 2.3 g/dL (ref 1.5–4.5)
Glucose: 211 mg/dL — ABNORMAL HIGH (ref 70–99)
Potassium: 4.5 mmol/L (ref 3.5–5.2)
Sodium: 136 mmol/L (ref 134–144)
Total Protein: 6.6 g/dL (ref 6.0–8.5)
eGFR: 48 mL/min/{1.73_m2} — ABNORMAL LOW (ref >59)

## 2022-01-07 LAB — LIPID PANEL
Chol/HDL Ratio: 5.6 ratio — ABNORMAL HIGH (ref 0.0–5.0)
Cholesterol, Total: 206 mg/dL — ABNORMAL HIGH (ref 100–199)
HDL: 37 mg/dL — ABNORMAL LOW (ref >39)
LDL Chol Calc (NIH): 112 mg/dL — ABNORMAL HIGH (ref 0–99)
Triglycerides: 329 mg/dL — ABNORMAL HIGH (ref 0–149)
VLDL Cholesterol Cal: 57 mg/dL — ABNORMAL HIGH (ref 5–40)

## 2022-01-07 LAB — URINALYSIS, ROUTINE W REFLEX MICROSCOPIC
Bilirubin, UA: NEGATIVE
Glucose, UA: NEGATIVE
Ketones, UA: NEGATIVE
Leukocytes,UA: NEGATIVE
Nitrite, UA: NEGATIVE
RBC, UA: NEGATIVE
Specific Gravity, UA: 1.02 (ref 1.005–1.030)
Urobilinogen, Ur: 0.2 mg/dL (ref 0.2–1.0)
pH, UA: 5.5 (ref 5.0–7.5)

## 2022-01-07 LAB — MICROALBUMIN / CREATININE URINE RATIO
Creatinine, Urine: 132.4 mg/dL
Microalb/Creat Ratio: 85 mg/g creat — ABNORMAL HIGH (ref 0–29)
Microalbumin, Urine: 113.1 ug/mL

## 2022-01-07 LAB — HEMOGLOBIN A1C
Est. average glucose Bld gHb Est-mCnc: 223 mg/dL
Hgb A1c MFr Bld: 9.4 % — ABNORMAL HIGH (ref 4.8–5.6)

## 2022-01-07 MED ORDER — TADALAFIL 5 MG PO TABS: ORAL_TABLET | ORAL | 5 refills | Status: DC

## 2022-01-07 NOTE — Progress Notes (Signed)
Subjective: 1. BPH with urinary obstruction   2. Slowing, urinary stream   3. Nocturia   4. Erectile dysfunction due to arterial insufficiency   5. Pain in left testicle      01/07/22: [name] returns today in f/u.  He was given alfuzosin at his last visit but he had symptomatic orthostasis and is back on tamsulosin qod but still has some issues with the low BP.  He has been using the tadalafil and surprisingly he has an improved BP.   His IPSS remains elevated at 23.  He has nocturia x 3.  The tadalafil '5mg'$   does help his ED but he doesn't have too many opportunities.   He has difficulty getting and maintaining his erections even with the meds.  He is having some left testicular pain consistent with his prior complaints.   UA is clear.   10/15/21: [name] returns today in f/u for voiding studies and cystoscopy.  His PVR is 14m.  He didn't have an adequate volume for a FR.   He was given daily tadalafil but he didn't take it compliantly as he didn't realize that he was to take for his LUTS.   He remains on tamsulosin.    GU Hx: [name] a 61yo male who is sent by Dr. PPosey Prontofor voiding symptoms.  His last PSA was 1.9 on 06/27/20.  He had a UA and culture on 03/26/21 that were negative.  He was given Levofloxin for the recent flair and finished it 3 days ago after a 2-3 week course.  He remains symptomatic and has left testicular pain.   He has seen Dr. JMichela Pitcherin the past for similar issues.   He was treated with levaquin for chronic prostatitis.  He returns now with a flair that began in July with the last episode a year ago.   It happened after he got dehydrated putting up his pool this year.  He had urgency with frequency and small voids.  He didn't feel he was emptying his bladder.   His on tamsulosin but has been variable with compliance because of some BP issue.  He has no history of stones or GU surgery.   He is a diabetic following pancreatitis in 2001 impacting both the endocrine and exocrine function  and has peripheral neuropathy.  He has has polycythemia and has to give blood on occasion.  He has ED and is on sildenafil '50mg'$  which isn't quite strong enough.  He had a CT stone study in 2020 and the prostate was moderately enlarged with a 4.8cm transverse diameter. His bladder was not distended and he had normal kidneys.     IPSS     Row Name 01/07/22 1100         International Prostate Symptom Score   How often have you had the sensation of not emptying your bladder? More than half the time     How often have you had to urinate less than every two hours? About half the time     How often have you found you stopped and started again several times when you urinated? About half the time     How often have you found it difficult to postpone urination? Less than half the time     How often have you had a weak urinary stream? More than half the time     How often have you had to strain to start urination? More than half the time  How many times did you typically get up at night to urinate? 3 Times     Total IPSS Score 23       Quality of Life due to urinary symptoms   If you were to spend the rest of your life with your urinary condition just the way it is now how would you feel about that? Mostly Disatisfied              ROS:  Review of Systems  Constitutional:  Positive for malaise/fatigue.  HENT:  Positive for congestion.   Gastrointestinal:  Positive for heartburn and nausea.  Neurological:  Positive for weakness.  Endo/Heme/Allergies:  Positive for polydipsia.  Psychiatric/Behavioral:  Positive for memory loss. The patient is nervous/anxious.   All other systems reviewed and are negative.  Allergies  Allergen Reactions   Iodinated Contrast Media Hives, Itching and Nausea Only    Flushing, Burning, itching Other reaction(s): Angioedema (ALLERGY/intolerance)    Iohexol Hives and Shortness Of Breath     Desc: HIVES,SOB NEEDS PREP.MEDS    Metoclopramide Hcl Hives     Patient became very spastic   Alfuzosin Hcl Er     Dropped blood pressure   Sulfonamide Derivatives Hives    Large hives   Enalapril Cough   Penicillins Rash    Has patient had a PCN reaction causing immediate rash, facial/tongue/throat swelling, SOB or lightheadedness with hypotension: Yes Has patient had a PCN reaction causing severe rash involving mucus membranes or skin necrosis: No Has patient had a PCN reaction that required hospitalization No Has patient had a PCN reaction occurring within the last 10 years: No. If all of the above answers are "NO", then may proceed with Cephalosporin use.    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  Patent coronary bypass grafts with a free radial graft to PDA and LIMA to the  diag.   Diabetes mellitus    Diabetic peripheral neuropathy ([place]) 05/06/2020   Dyslipidemia    Poorly controlled, probably related to his DM (Some of this is secondary to his inability to afford medications).   ED (erectile dysfunction)    Esophageal yeast infection ([place])    Hypertension    Metaplasia of esophagus    Pancreatitis chronic    Pseudocyst   Polycythemia vera(238.4) 04/26/2012   PONV (postoperative nausea and vomiting)     Past Surgical History:  Procedure Laterality Date   APPENDECTOMY     BIOPSY  11/11/2021   Procedure: BIOPSY;  Surgeon: Rogene Houston, MD;  [facility];  Service: Endoscopy;;   CARDIAC CATHETERIZATION     CHOLECYSTECTOMY     COLONOSCOPY  07/31/1985   COLONOSCOPY N/A 03/11/2016   Procedure: COLONOSCOPY;  Surgeon: Rogene Houston, MD;  [facility];  Service: Endoscopy;  Laterality: N/A;   COLONOSCOPY WITH PROPOFOL N/A 11/11/2021   Procedure:  COLONOSCOPY WITH PROPOFOL;  Surgeon: Rogene Houston, MD;  [facility];  Service: Endoscopy;  Laterality: N/A;  205   CORONARY ANGIOPLASTY WITH STENT PLACEMENT  2006   CORONARY ARTERY BYPASS GRAFT  11/24/2004   ESOPHAGOGASTRODUODENOSCOPY  09/19/01  ESOPHAGOGASTRODUODENOSCOPY N/A 03/11/2016   Procedure: ESOPHAGOGASTRODUODENOSCOPY (EGD);  Surgeon: Rogene Houston, MD;  [facility];  Service: Endoscopy;  Laterality: N/A;  12:00   ESOPHAGOGASTRODUODENOSCOPY (EGD) WITH PROPOFOL N/A 11/11/2021   Procedure: ESOPHAGOGASTRODUODENOSCOPY (EGD) WITH PROPOFOL;  Surgeon: Rogene Houston, MD;  [facility];  Service: Endoscopy;  Laterality: N/A;   FINGER TENDON REPAIR     in middle finger   KNEE SURGERY     left knee   NASAL SEPTUM SURGERY     POLYPECTOMY  11/11/2021   Procedure: POLYPECTOMY;  Surgeon: Rogene Houston, MD;  [facility];  Service: Endoscopy;;    Social History   Socioeconomic History   Marital status: Married    Spouse name: Not on file   Number of children: Not on file   Years of education: Not on file   Highest education level: Not on file  Occupational History   Not on file  Tobacco Use   Smoking status: Never   Smokeless tobacco: Never  Substance and Sexual Activity   Alcohol use: Not Currently    Comment: Patient may drink a beer or glass of wine a couple times a month   Drug use: No   Sexual activity: Not on file  Other Topics Concern   Not on file  Social History Narrative   The patient lives in [place] with his wife.  He is     disabled secondary to coronary artery disease and pancreatitis.  He does     not smoke, he does not drink alcohol.  There is no drug use.    Social Determinants of Health   Financial Resource Strain: Low Risk    Difficulty of Paying Living Expenses: Not hard at all  Food Insecurity: No Food Insecurity   Worried About Charity fundraiser in the Last Year: Never true   [place] in  the Last Year: Never true  Transportation Needs: No Transportation Needs   Lack of Transportation (Medical): No   Lack of Transportation (Non-Medical): No  Physical Activity: Inactive   Days of Exercise per Week: 0 days   Minutes of Exercise per Session: 0 min  Stress: No Stress Concern Present   Feeling of Stress : Not at all  Social Connections: Moderately Integrated   Frequency of Communication with Friends and Family: Three times a week   Frequency of Social Gatherings with Friends and Family: Not on file   Attends Religious Services: More than 4 times per year   Active Member of [employer] or Organizations: No   Attends Archivist Meetings: Never   Marital Status: Married  Human resources officer Violence: Not on file    Family History  Problem Relation Age of Onset   Coronary artery disease Mother        strong family history of   Heart attack Mother        dying of (at age 60)   Diabetes Mother    Diabetes Sister    Diabetes Brother    Pancreatitis Brother    Healthy Daughter    Hyperlipidemia Daughter    Heart disease Brother    Hypertension Brother    Hyperlipidemia Brother     Anti-infectives: Anti-infectives (From admission, onward)    None       Current Outpatient Medications  Medication Sig Dispense Refill   Cholecalciferol 125 MCG (5000 UT) TABS Take 10,000 Units by mouth daily.     clopidogrel (  PLAVIX) 75 MG tablet TAKE ONE TABLET BY MOUTH ONCE DAILY AS A BLOOD THINNER. 90 tablet 0   Continuous Blood Gluc Receiver (DEXCOM G7 RECEIVER) DEVI USE TO CHECK GLUCOSE AS NEEDED 1 each 0   Continuous Blood Gluc Sensor (DEXCOM G7 SENSOR) MISC CHANGE SENSOR EVERY 10 DAYS 3 each 2   Continuous Blood Gluc Transmit (DEXCOM G6 TRANSMITTER) MISC Use 1 transmitter every 90 days. 1 each 1   diazepam (VALIUM) 10 MG tablet Take 0.5 tablets (5 mg total) by mouth at bedtime as needed for anxiety or sleep. 30 tablet 0   ezetimibe (ZETIA) 10 MG tablet Take 1 tablet (10 mg  total) by mouth daily. 90 tablet 0   fenofibrate micronized (LOFIBRA) 67 MG capsule Take 1 capsule (67 mg total) by mouth daily before breakfast. 90 capsule 4   gabapentin (NEURONTIN) 300 MG capsule Take 2 capsules (600 mg total) by mouth 3 (three) times daily. 540 capsule 1   glucose blood (FREESTYLE TEST STRIPS) test strip Use as instructed 100 each 12   hydrochlorothiazide (HYDRODIURIL) 25 MG tablet TAKE (1) TABLET BY MOUTH TWICE DAILY FOR HIGH BLOOD PRESSURE. (Patient taking differently: Take 25 mg by mouth in the morning.) 180 tablet 0   HYDROcodone-acetaminophen (NORCO) 10-325 MG tablet TAKE 1 TABLET BY MOUTH EVERY 6 HOURS AS NEEDED FOR SEVERE PAIN. 120 tablet 0   icosapent Ethyl (VASCEPA) 1 g capsule Take 2 capsules (2 g total) by mouth 2 (two) times daily. 360 capsule 3   insulin NPH-regular Human (NOVOLIN 70/30) (70-30) 100 UNIT/ML injection Inject 50 Units into the skin 2 (two) times daily before a meal. When glucose is above 90 and eating     lansoprazole (PREVACID) 30 MG capsule Take 1 capsule (30 mg total) by mouth daily at 12 noon. 90 capsule 3   losartan (COZAAR) 50 MG tablet Take 1 tablet (50 mg total) by mouth daily. (Patient taking differently: Take 25 mg by mouth at bedtime.) 90 tablet 3   metFORMIN (GLUCOPHAGE-XR) 500 MG 24 hr tablet Take 1 tablet (500 mg total) by mouth daily with breakfast. 90 tablet 1   methocarbamol (ROBAXIN) 500 MG tablet TAKE 1 TABLET BY MOUTH EVERY 8 HOURS AS NEEDED FOR MUSCLE SPASMS. 30 tablet 0   metoprolol tartrate (LOPRESSOR) 25 MG tablet TAKE 1 TABLET BY MOUTH TWICE A DAY. 60 tablet 5   nitroGLYCERIN (NITROSTAT) 0.4 MG SL tablet Place 0.4 mg under the tongue every 5 (five) minutes as needed for chest pain. Reported on 02/26/2016     NON FORMULARY Take 20 mg by mouth daily. Domperidone (Motilium) 10 mg     NONFORMULARY OR COMPOUNDED ITEM Apply 1 application. topically daily as needed (neuropathy). Compounded cream for foot pain. Gets at [employer].      ondansetron (ZOFRAN) 4 MG tablet TAKE (1) TABLET BY MOUTH (3) TIMES DAILY AS NEEDED FOR NAUSEA. 30 tablet 0   PARoxetine (PAXIL) 10 MG tablet TAKE ONE TABLET BY MOUTH ONCE DAILY FOR DEPRESSION. 90 tablet 0   polyethylene glycol (MIRALAX / GLYCOLAX) 17 g packet Take 17 g by mouth daily as needed for moderate constipation.     promethazine (PHENERGAN) 25 MG tablet Take 25 mg by mouth every 6 (six) hours as needed for nausea or vomiting. Take 1/4 to 1/2 tablet by mouth as needed     tamsulosin (FLOMAX) 0.4 MG CAPS capsule Take 0.4 mg by mouth daily.     Bempedoic Acid 180 MG TABS Take 180 mg by  mouth daily. (Patient not taking: Reported on 12/22/2021) 30 tablet 11   tadalafil (CIALIS) 5 MG tablet Take 1-4 tabs po prn.  Don't take within 4 hours of tamsulosin or with the NTG. 30 tablet 5   No current facility-administered medications for this visit.     Objective: Vital signs in last 24 hours: BP (!) 153/97 (BP Location: Right Arm, Patient Position: Sitting, Cuff Size: Normal)   Pulse 66   Ht 5' 9.5" (1.765 m)   Wt 179 lb 8 oz (81.4 kg)   BMI 26.13 kg/m   Intake/Output from previous day: No intake/output data recorded. Intake/Output this shift: '@IOTHISSHIFT'$ @   Physical Exam Genitourinary:    Comments: Normal phallus with adequate meatus. Scrotum normal. Testes normal size without mass.  Mild left tenderness. Epididymis normal. No hernia or inguinal tenderness.    Lab Results:  Results for orders placed or performed in visit on 01/07/22 (from the past 24 hour(s))  Urinalysis, Routine w reflex microscopic     Status: Abnormal   Collection Time: 01/07/22 11:31 AM  Result Value Ref Range   Specific Gravity, UA 1.020 1.005 - 1.030   pH, UA 5.5 5.0 - 7.5   Color, UA Yellow Yellow   Appearance Ur Clear Clear   Leukocytes,UA Negative Negative   Protein,UA Trace (A) Negative/Trace   Glucose, UA Negative Negative   Ketones, UA Negative Negative   RBC, UA Negative Negative    Bilirubin, UA Negative Negative   Urobilinogen, Ur 0.2 0.2 - 1.0 mg/dL   Nitrite, UA Negative Negative   Microscopic Examination Comment    Narrative   Performed at:  [facility] [address], [place], [place]  [id number] Lab Director: [place], Phone:  [phone]   Lab Results  Component Value Date   PSA1 1.9 06/27/2020   PSA 1.35 on 09/29/21.     BMET Recent Labs    01/05/22 1318 01/05/22 1319  NA 138 136  K 4.7 4.5  CL 100 97  CO2 23 23  GLUCOSE 215* 211*  BUN 28* 25*  CREATININE 1.69* 1.65*  CALCIUM 9.5 9.1   PT/INR No results for input(s): LABPROT, INR in the last 72 hours. ABG No results for input(s): PHART, HCO3 in the last 72 hours.  Invalid input(s): PCO2, PO2  Studies/Results:   Assessment/Plan: Left testicular pain.   I don't think he has prostatitis.  I am going to refer him to Dr. Cain Sieve in the [facility] office for further evaluation and management of the testicular pain.     BPH with BOO and severe symptoms on tamsulosin.  He didn't tolerate alfuzosin and is taking the tamsulosin qod.  We discussed procedures but will stay the course.     ED.  He is getting a partial response to the tadalafil '5mg'$  and he will try going up on the dose slowly.  We talked about the risks with tadalafil and NTG and tamsulosin and he will careful.  I discussed the VED and injection.    Meds ordered this encounter  Medications   tadalafil (CIALIS) 5 MG tablet    Sig: Take 1-4 tabs po prn.  Don't take within 4 hours of tamsulosin or with the NTG.    Dispense:  30 tablet    Refill:  5      Orders Placed This Encounter  Procedures   Urinalysis, Routine w reflex microscopic   Ambulatory referral to Urology    Referral Priority:   Routine  Referral Type:   Consultation    Referral Reason:   Specialty Services Required    Referred to Provider:   Vira Agar, MD    Requested Specialty:   Urology    Number of Visits Requested:   1      Return in about 6 months (around 07/10/2022).    CC: Dr. [name].      Irine Seal 01/07/2022 [phone]

## 2022-01-14 ENCOUNTER — Ambulatory Visit: Payer: Medicare Other | Admitting: Urology

## 2022-01-19 ENCOUNTER — Encounter: Payer: Self-pay | Admitting: "Endocrinology

## 2022-01-19 ENCOUNTER — Ambulatory Visit: Payer: Medicare Other | Admitting: "Endocrinology

## 2022-01-19 VITALS — BP 128/84 | HR 64 | Ht 69.5 in | Wt 179.8 lb

## 2022-01-19 DIAGNOSIS — I10 Essential (primary) hypertension: Secondary | ICD-10-CM

## 2022-01-19 DIAGNOSIS — K8681 Exocrine pancreatic insufficiency: Secondary | ICD-10-CM

## 2022-01-19 DIAGNOSIS — E782 Mixed hyperlipidemia: Secondary | ICD-10-CM | POA: Diagnosis not present

## 2022-01-19 DIAGNOSIS — Z789 Other specified health status: Secondary | ICD-10-CM

## 2022-01-19 DIAGNOSIS — Z794 Long term (current) use of insulin: Secondary | ICD-10-CM

## 2022-01-19 DIAGNOSIS — E1140 Type 2 diabetes mellitus with diabetic neuropathy, unspecified: Secondary | ICD-10-CM | POA: Diagnosis not present

## 2022-01-19 NOTE — Patient Instructions (Signed)

## 2022-01-19 NOTE — Progress Notes (Signed)
01/19/2022, 6:27 PM   Endocrinology follow-up note  Subjective:    Patient ID: [name], male    DOB: Jul 21, 1961.  [name] is being seen in  follow-up after he was seen in consultation for management of currently uncontrolled symptomatic diabetes, hyperlipidemia, exocrine pancreatic insufficiency. PMD:  Lindell Spar, MD.   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  Patent coronary bypass grafts with a free radial graft to PDA and LIMA to the  diag.   Diabetes mellitus    Diabetic peripheral neuropathy ([place]) 05/06/2020   Dyslipidemia    Poorly controlled, probably related to his DM (Some of this is secondary to his inability to afford medications).   ED (erectile dysfunction)    Esophageal yeast infection ([place])    Hypertension    Metaplasia of esophagus    Pancreatitis chronic    Pseudocyst   Polycythemia vera(238.4) 04/26/2012   PONV (postoperative nausea and vomiting)     Past Surgical History:  Procedure Laterality Date   APPENDECTOMY     BIOPSY  11/11/2021   Procedure: BIOPSY;  Surgeon: Rogene Houston, MD;  [facility];  Service: Endoscopy;;   CARDIAC CATHETERIZATION     CHOLECYSTECTOMY     COLONOSCOPY  07/31/1985   COLONOSCOPY N/A 03/11/2016   Procedure: COLONOSCOPY;  Surgeon: Rogene Houston, MD;  [facility];  Service: Endoscopy;  Laterality: N/A;   COLONOSCOPY WITH PROPOFOL N/A 11/11/2021   Procedure: COLONOSCOPY WITH PROPOFOL;  Surgeon: Rogene Houston, MD;  [facility];  Service: Endoscopy;  Laterality: N/A;  205   CORONARY ANGIOPLASTY WITH STENT PLACEMENT  2006   CORONARY ARTERY BYPASS GRAFT  11/24/2004   ESOPHAGOGASTRODUODENOSCOPY  09/19/01   ESOPHAGOGASTRODUODENOSCOPY N/A  03/11/2016   Procedure: ESOPHAGOGASTRODUODENOSCOPY (EGD);  Surgeon: Rogene Houston, MD;  [facility];  Service: Endoscopy;  Laterality: N/A;  12:00   ESOPHAGOGASTRODUODENOSCOPY (EGD) WITH PROPOFOL N/A 11/11/2021   Procedure: ESOPHAGOGASTRODUODENOSCOPY (EGD) WITH PROPOFOL;  Surgeon: Rogene Houston, MD;  [facility];  Service: Endoscopy;  Laterality: N/A;   FINGER TENDON REPAIR     in middle finger   KNEE SURGERY     left knee   NASAL SEPTUM SURGERY     POLYPECTOMY  11/11/2021   Procedure: POLYPECTOMY;  Surgeon: Rogene Houston, MD;  [facility];  Service: Endoscopy;;    Social History   Socioeconomic History   Marital status: Married    Spouse name: Not on file   Number of children: Not on file   Years of education: Not on file   Highest education level: Not on file  Occupational History   Not on file  Tobacco Use   Smoking status: Never   Smokeless tobacco: Never  Substance and Sexual Activity   Alcohol use: Not Currently    Comment: Patient may drink a beer or glass of wine a couple times a month   Drug use: No   Sexual activity: Not on file  Other Topics Concern   Not on file  Social History Narrative   The patient lives in [place] with his wife.  He is     disabled secondary to coronary artery disease and pancreatitis.  He does     not smoke, he does not drink alcohol.  There is no drug use.    Social Determinants of Health   Financial Resource Strain: Low Risk    Difficulty of Paying Living Expenses: Not hard at all  Food Insecurity: No Food Insecurity   Worried About Charity fundraiser in the Last Year: Never true   [place] in the Last Year: Never true  Transportation Needs: No Transportation Needs   Lack of Transportation (Medical): No   Lack of Transportation (Non-Medical): No  Physical Activity: Inactive   Days of Exercise per Week: 0 days   Minutes of Exercise per Session: 0 min  Stress: No Stress Concern  Present   Feeling of Stress : Not at all  Social Connections: Moderately Integrated   Frequency of Communication with Friends and Family: Three times a week   Frequency of Social Gatherings with Friends and Family: Not on file   Attends Religious Services: More than 4 times per year   Active Member of [employer] or Organizations: No   Attends Music therapist: Never   Marital Status: Married    Family History  Problem Relation Age of Onset   Coronary artery disease Mother        strong family history of   Heart attack Mother        dying of (at age 25)   Diabetes Mother    Diabetes Sister    Diabetes Brother    Pancreatitis Brother    Healthy Daughter    Hyperlipidemia Daughter    Heart disease Brother    Hypertension Brother    Hyperlipidemia Brother     Outpatient Encounter Medications as of 01/19/2022  Medication Sig   Bempedoic Acid 180 MG TABS Take 180 mg by mouth daily. (Patient not taking: Reported on 12/22/2021)   Cholecalciferol 125 MCG (5000 UT) TABS Take 10,000 Units by mouth daily.   clopidogrel (PLAVIX) 75 MG tablet TAKE ONE TABLET BY MOUTH ONCE DAILY AS A BLOOD THINNER.   Continuous Blood Gluc Receiver (DEXCOM G7 RECEIVER) DEVI USE TO CHECK GLUCOSE AS NEEDED   Continuous Blood Gluc Sensor (DEXCOM G7 SENSOR) MISC CHANGE SENSOR EVERY 10 DAYS   Continuous Blood Gluc Transmit (DEXCOM G6 TRANSMITTER) MISC Use 1 transmitter every 90 days.   diazepam (VALIUM) 10 MG tablet Take 0.5 tablets (5 mg total) by mouth at bedtime as needed for anxiety or sleep.   ezetimibe (ZETIA) 10 MG tablet Take 1 tablet (10 mg total) by mouth daily.   fenofibrate micronized (LOFIBRA) 67 MG capsule Take 1 capsule (67 mg total) by mouth daily before breakfast. (Patient not taking: Reported on 01/19/2022)   gabapentin (NEURONTIN) 300 MG capsule Take 2 capsules (600 mg total) by mouth 3 (three) times daily.   glucose blood (FREESTYLE TEST STRIPS) test strip Use as instructed    hydrochlorothiazide (HYDRODIURIL) 25 MG tablet TAKE (1) TABLET BY MOUTH TWICE DAILY FOR HIGH BLOOD PRESSURE. (Patient taking differently: Take 25 mg by mouth in the morning.)   HYDROcodone-acetaminophen (NORCO) 10-325 MG tablet TAKE 1 TABLET BY MOUTH EVERY 6 HOURS AS NEEDED FOR SEVERE PAIN.   icosapent Ethyl (VASCEPA) 1 g capsule Take 2 capsules (2 g total) by mouth 2 (two) times daily.   insulin NPH-regular Human (NOVOLIN 70/30) (70-30) 100 UNIT/ML injection Inject 60 Units into the skin 2 (two) times  daily before a meal. When glucose is above 90 and eating   lansoprazole (PREVACID) 30 MG capsule Take 1 capsule (30 mg total) by mouth daily at 12 noon.   losartan (COZAAR) 50 MG tablet Take 1 tablet (50 mg total) by mouth daily. (Patient taking differently: Take 25 mg by mouth at bedtime.)   metFORMIN (GLUCOPHAGE-XR) 500 MG 24 hr tablet Take 1 tablet (500 mg total) by mouth daily with breakfast.   methocarbamol (ROBAXIN) 500 MG tablet TAKE 1 TABLET BY MOUTH EVERY 8 HOURS AS NEEDED FOR MUSCLE SPASMS.   metoprolol tartrate (LOPRESSOR) 25 MG tablet TAKE 1 TABLET BY MOUTH TWICE A DAY.   nitroGLYCERIN (NITROSTAT) 0.4 MG SL tablet Place 0.4 mg under the tongue every 5 (five) minutes as needed for chest pain. Reported on 02/26/2016   NON FORMULARY Take 20 mg by mouth daily. Domperidone (Motilium) 10 mg   NONFORMULARY OR COMPOUNDED ITEM Apply 1 application. topically daily as needed (neuropathy). Compounded cream for foot pain. Gets at [employer].   ondansetron (ZOFRAN) 4 MG tablet TAKE (1) TABLET BY MOUTH (3) TIMES DAILY AS NEEDED FOR NAUSEA.   PARoxetine (PAXIL) 10 MG tablet TAKE ONE TABLET BY MOUTH ONCE DAILY FOR DEPRESSION.   polyethylene glycol (MIRALAX / GLYCOLAX) 17 g packet Take 17 g by mouth daily as needed for moderate constipation.   promethazine (PHENERGAN) 25 MG tablet Take 25 mg by mouth every 6 (six) hours as needed for nausea or vomiting. Take 1/4 to 1/2 tablet by mouth as needed   tadalafil  (CIALIS) 5 MG tablet Take 1-4 tabs po prn.  Don't take within 4 hours of tamsulosin or with the NTG.   tamsulosin (FLOMAX) 0.4 MG CAPS capsule Take 0.4 mg by mouth daily.   No facility-administered encounter medications on file as of 01/19/2022.    ALLERGIES: Allergies  Allergen Reactions   Iodinated Contrast Media Hives, Itching and Nausea Only    Flushing, Burning, itching Other reaction(s): Angioedema (ALLERGY/intolerance)    Iohexol Hives and Shortness Of Breath     Desc: HIVES,SOB NEEDS PREP.MEDS    Metoclopramide Hcl Hives    Patient became very spastic   Alfuzosin Hcl Er     Dropped blood pressure   Sulfonamide Derivatives Hives    Large hives   Enalapril Cough   Penicillins Rash    Has patient had a PCN reaction causing immediate rash, facial/tongue/throat swelling, SOB or lightheadedness with hypotension: Yes Has patient had a PCN reaction causing severe rash involving mucus membranes or skin necrosis: No Has patient had a PCN reaction that required hospitalization No Has patient had a PCN reaction occurring within the last 10 years: No. If all of the above answers are "NO", then may proceed with Cephalosporin use.    Sulfa Antibiotics Rash    Other Reaction: Not Assessed    VACCINATION STATUS: Immunization History  Administered Date(s) Administered   Influenza Split 07/23/2014    Diabetes He presents for his follow-up diabetic visit. Diabetes type: Pancreatic diabetes. His disease course has been worsening. There are no hypoglycemic associated symptoms. Pertinent negatives for hypoglycemia include no confusion, headaches, pallor or seizures. Pertinent negatives for diabetes include no chest pain, no fatigue, no polydipsia, no polyphagia, no polyuria and no weakness. There are no hypoglycemic complications. Symptoms are worsening. Diabetic complications include heart disease, impotence and peripheral neuropathy. Risk factors for coronary artery disease include  dyslipidemia, diabetes mellitus, family history, male sex, hypertension and sedentary lifestyle. Current diabetic treatment includes insulin  injections (Is currently on Novolin 70/30 50 units nightly,  Metformin 500 mg p.o. twice daily). His weight is fluctuating minimally. He is following a generally unhealthy diet. When asked about meal planning, he reported none. He has not had a previous visit with a dietitian. He participates in exercise intermittently. His home blood glucose trend is decreasing steadily. His breakfast blood glucose range is generally >200 mg/dl. His lunch blood glucose range is generally >200 mg/dl. His dinner blood glucose range is generally >200 mg/dl. His bedtime blood glucose range is generally >200 mg/dl. His overall blood glucose range is >200 mg/dl. Lezcano presents with improved glycemic profile, 46% time range in his CGM device, 52% above range.    His point-of-care A1c is 8.1% improving from 9.2%.  Average blood glucose for the last 14 days is 183.  No major hypoglycemia documented or reported.   ) An ACE inhibitor/angiotensin II receptor blocker is not being taken. Eye exam is current.  Hyperlipidemia This is a chronic problem. The current episode started more than 1 year ago. Recent lipid tests were reviewed and are high. Pertinent negatives include no chest pain, myalgias or shortness of breath. Current antihyperlipidemic treatment includes bile acid sequestrants (He has statin intolerance.). Risk factors for coronary artery disease include dyslipidemia, diabetes mellitus, family history, hypertension, male sex and a sedentary lifestyle.  Hypertension This is a chronic problem. The current episode started more than 1 year ago. The problem is controlled. Pertinent negatives include no chest pain, headaches, neck pain, palpitations or shortness of breath. Risk factors for coronary artery disease include dyslipidemia, diabetes mellitus, family history, male gender and sedentary  lifestyle. Past treatments include diuretics and beta blockers. Hypertensive end-organ damage includes CAD/MI.    Review of Systems  Constitutional:  Negative for chills, fatigue, fever and unexpected weight change.  HENT:  Negative for dental problem, mouth sores and trouble swallowing.   Eyes:  Negative for visual disturbance.  Respiratory:  Negative for cough, choking, chest tightness, shortness of breath and wheezing.   Cardiovascular:  Negative for chest pain, palpitations and leg swelling.  Gastrointestinal:  Negative for abdominal distention, abdominal pain, constipation, diarrhea, nausea and vomiting.  Endocrine: Negative for polydipsia, polyphagia and polyuria.  Genitourinary:  Positive for impotence. Negative for dysuria, flank pain, hematuria and urgency.  Musculoskeletal:  Negative for back pain, gait problem, myalgias and neck pain.  Skin:  Negative for pallor, rash and wound.  Neurological:  Negative for seizures, syncope, weakness, numbness and headaches.  Psychiatric/Behavioral:  Negative for confusion and dysphoric mood.    Objective:       01/19/2022    3:01 PM 01/07/2022   10:42 AM 12/22/2021    1:42 PM  Vitals with BMI  Height 5' 9.5" 5' 9.5" 5' 9.5"  Weight 179 lbs 13 oz 179 lbs 8 oz 178 lbs 6 oz  BMI 26.18 16.60 63.01  Systolic 601 093 235  Diastolic 84 97 82  Pulse 64 66 70    BP 128/84   Pulse 64   Ht 5' 9.5" (1.765 m)   Wt 179 lb 12.8 oz (81.6 kg)   BMI 26.17 kg/m   Wt Readings from Last 3 Encounters:  01/19/22 179 lb 12.8 oz (81.6 kg)  01/07/22 179 lb 8 oz (81.4 kg)  12/22/21 178 lb 6.4 oz (80.9 kg)      CMP ( most recent) CMP     Component Value Date/Time   NA 136 01/05/2022 1319   K 4.5 01/05/2022 1319  CL 97 01/05/2022 1319   CO2 23 01/05/2022 1319   GLUCOSE 211 (H) 01/05/2022 1319   GLUCOSE 224 (H) 11/06/2021 1400   BUN 25 (H) 01/05/2022 1319   CREATININE 1.65 (H) 01/05/2022 1319   CREATININE 1.27 07/08/2017 1343   CALCIUM 9.1  01/05/2022 1319   PROT 6.6 01/05/2022 1319   ALBUMIN 4.3 01/05/2022 1319   AST 26 01/05/2022 1319   ALT 18 01/05/2022 1319   ALKPHOS 57 01/05/2022 1319   BILITOT 0.4 01/05/2022 1319   GFRNONAA 45 (L) 11/06/2021 1400   GFRAA 62 06/27/2020 1500     Diabetic Labs (most recent): Lab Results  Component Value Date   HGBA1C 9.4 (H) 01/05/2022   HGBA1C 8.6 (H) 09/29/2021   HGBA1C 8.1 (A) 05/06/2021     Lipid Panel ( most recent) Lipid Panel     Component Value Date/Time   CHOL 206 (H) 01/05/2022 1319   TRIG 329 (H) 01/05/2022 1319   HDL 37 (L) 01/05/2022 1319   CHOLHDL 5.6 (H) 01/05/2022 1319   CHOLHDL NOT REPORTED DUE TO HIGH TRIGLYCERIDES 09/29/2021 1531   VLDL UNABLE TO CALCULATE IF TRIGLYCERIDE OVER 400 mg/dL 09/29/2021 1531   LDLCALC 112 (H) 01/05/2022 1319   LDLDIRECT 110 08/19/2016 1259   LABVLDL 57 (H) 01/05/2022 1319       Assessment & Plan:   1.  Uncontrolled diabetes due to pancreatic insufficiency , CKD.   - CATLIN AYCOCK has currently uncontrolled symptomatic diabetes as a result of pancreatic insufficiency 61 years of age,  with most recent A1c of 9.4%. Recent labs reviewed.  His medical history is long and complicated.  Jakevious presents with worsening glycemic profile, using his Dexcom CGM.    I had a long discussion with him about the pathology behind his diabetes and its complications. -his diabetes is complicated by coronary artery disease which required intervention, ED, peripheral neuropathy and he remains at a high risk for more acute and chronic complications which include CAD, CVA, CKD, retinopathy, and neuropathy. These are all discussed in detail with him.  - I have counseled him on diet  and weight management  by adopting a carbohydrate restricted/protein rich diet. Patient is encouraged to switch to  unprocessed or minimally processed     complex starch and increased protein intake (animal or plant source), fruits, and vegetables. -  he is advised  to stick to a routine mealtimes to eat 3 meals  a day and avoid unnecessary snacks ( to snack only to correct hypoglycemia).   - he acknowledges that there is a room for improvement in his food and drink choices. - Suggestion is made for him to avoid simple carbohydrates  from his diet including Cakes, Sweet Desserts, Ice Cream, Soda (diet and regular), Sweet Tea, Candies, Chips, Cookies, Store Bought Juices, Alcohol , Artificial Sweeteners,  Coffee Creamer, and "Sugar-free" Products, Lemonade. This will help patient to have more stable blood glucose profile and potentially avoid unintended weight gain.  The following Lifestyle Medicine recommendations according to [place]  [facility]) were discussed and and offered to patient and he  agrees to start the journey:  A. Whole Foods, Plant-Based Nutrition comprising of fruits and vegetables, plant-based proteins, whole-grain carbohydrates was discussed in detail with the patient.   A list for source of those nutrients were also provided to the patient.  Patient will use only water or unsweetened tea for hydration. B.  The need to stay away from risky substances  including alcohol, smoking; obtaining 7 to 9 hours of restorative sleep, at least 150 minutes of moderate intensity exercise weekly, the importance of healthy social connections,  and stress management techniques were discussed. C.  A full color page of  Calorie density of various food groups per pound showing examples of each food groups was provided to the patient.  - he will be scheduled with Jearld Fenton, RDN, CDE for diabetes education.  - I have approached him with the following individualized plan to manage  his diabetes and patient agrees:   -In light of his diabetes being due to his pancreatic insufficiency, he will continue to need multiple daily injections of insulin  in order for him to achieve and maintain control of diabetes to target.    -He has done  reasonably well with premixed insulin twice a day.   -He has been adjusting his dose to himself as well as increasing his metformin to maximal dose.  He is advised not to maximize his metformin due to his CKD.  He is advised to continue metformin 500 mg p.o. daily only at breakfast.   He is advised to increase his Novolin 70/30 to 60 units twice a day-daily with breakfast and supper    for premeal blood glucose readings above 90 mg per DL, associated with documenting glucose profile at least 4 times a day before meals and at bedtime using his CGM.  - he is warned not to take insulin without proper monitoring per orders.  - he is encouraged to call clinic for blood glucose levels less than 70 or above 200 mg /dl. -He will be considered for insulin analogs utilizing Lantus and NovoLog if necessary on next visit. - he is not a suitable candidate for incretin therapy.    - Specific targets for  A1c;  LDL, HDL,  and Triglycerides were discussed with the patient.  2) Blood Pressure /Hypertension:  -His blood pressure is controlled to target.  He will not tolerate any additional medications with ACE inhibitors or ARB. he is advised to continue his current medications including hydrochlorothiazide, and metoprolol. His urine microalbumin to creatinine ratio is abnormal.  He will have a repeat urine microalbumin next visit.  3) Lipids/Hyperlipidemia:   Review of his recent lipid panel showed uncontrolled triglyceride levels to 329  although improving from 900+.  His LDL has worsened to 112.      He does not tolerate statins.  He is on Lovaza 2 g p.o. twice daily, advised to continue.  He admits to dietary indiscretion.  He is also advised to avoid butter and fried food.  The above discussed whole food plant-based diet will help with dyslipidemia.    He will be considered for fasting lipid panel before his next visit.    4)  Weight/Diet:  Body mass index is 26.17 kg/m.  -     he is not a candidate for  weight loss. I discussed with him the fact that loss of 5 - 10% of his  current body weight will have the most impact on his diabetes management.  Exercise, and detailed carbohydrates information provided  -  detailed on discharge instructions.  5) Chronic Care/Health Maintenance:  -he  Is not  on ACEI/ARB and Statin medications and  is encouraged to initiate and continue to follow up with Ophthalmology, Dentist,  Podiatrist at least yearly or according to recommendations, and advised to   stay away from smoking. I have recommended yearly flu vaccine and pneumonia  vaccine at least every 5 years; moderate intensity exercise for up to 150 minutes weekly; and  sleep for at least 7 hours a day.  6) pancreatic insufficiency -He is advised to continue his enzymatic replacements currently pancrelipase 6000 units with meals and snacks.     - he is  advised to maintain close follow up with Lindell Spar, MD for primary care needs, as well as his other providers for optimal and coordinated care.   I spent 41 minutes in the care of the patient today including review of labs from [facility], Lipids, Thyroid Function, Hematology (current and previous including abstractions from other facilities); face-to-face time discussing  his blood glucose readings/logs, discussing hypoglycemia and hyperglycemia episodes and symptoms, medications doses, his options of short and long term treatment based on the latest standards of care / guidelines;  discussion about incorporating lifestyle medicine;  and documenting the encounter.    Please refer to Patient Instructions for Blood Glucose Monitoring and Insulin/Medications Dosing Guide"  in media tab for additional information. Please  also refer to " Patient Self Inventory" in the Media  tab for reviewed elements of pertinent patient history.  Maurice March participated in the discussions, expressed understanding, and voiced agreement with the above plans.  All questions were  answered to his satisfaction. he is encouraged to contact clinic should he have any questions or concerns prior to his return visit.   Follow up plan: - Return in about 6 months (around 07/22/2022) for F/U with Pre-visit Labs, Meter/CGM/Logs, A1c here.  Glade Lloyd, MD [facility] [address] [place], [place] [address] Phone: [phone]  Fax: [phone]    01/19/2022, 6:27 PM  This note was partially dictated with voice recognition software. Similar sounding words can be transcribed inadequately or may not  be corrected upon review.

## 2022-01-21 ENCOUNTER — Other Ambulatory Visit: Payer: Self-pay | Admitting: Internal Medicine

## 2022-01-21 ENCOUNTER — Encounter: Payer: Self-pay | Admitting: Internal Medicine

## 2022-01-21 ENCOUNTER — Telehealth: Payer: Self-pay

## 2022-01-21 NOTE — Telephone Encounter (Signed)
Patient made aware of Dr. Ralene Muskrat  recommendation. Patient scheduled to be seen by PA in the morning.

## 2022-01-21 NOTE — Telephone Encounter (Signed)
Patient called advising that he thinks he is having a prostate flare-up. He advised he is having some pain and some pain and wanted to know if an antibiotic could be called in. He has an appt next week from the referral for [facility] and he reached out to them. The nurse from [facility] advised him to call us and get an antibiotic before the appt. Patient is going out of town this weekend for a funeral.   Pharmacy: [facility]

## 2022-01-21 NOTE — Telephone Encounter (Addendum)
Pt scheduled with Sharee Pimple on 06/02

## 2022-01-21 NOTE — Telephone Encounter (Signed)
-----   Message from Irine Seal, MD sent at 01/21/2022  4:20 PM EDT ----- He called the [facility] office with UTI symptoms.  He has not had a positive culture or positive UA in the system for infection.  He would need at a minimum to drop of a specimen for a UA or culture this week and if he can be seen all the better.

## 2022-01-22 ENCOUNTER — Encounter: Payer: Self-pay | Admitting: Urology

## 2022-01-22 ENCOUNTER — Ambulatory Visit: Payer: Medicare Other | Admitting: Physician Assistant

## 2022-01-22 ENCOUNTER — Telehealth: Payer: Self-pay

## 2022-01-22 ENCOUNTER — Other Ambulatory Visit: Payer: Self-pay | Admitting: Internal Medicine

## 2022-01-22 DIAGNOSIS — N411 Chronic prostatitis: Secondary | ICD-10-CM | POA: Insufficient documentation

## 2022-01-22 MED ORDER — LEVOFLOXACIN 500 MG PO TABS: 500.0000 mg | ORAL_TABLET | Freq: Every day | ORAL | 0 refills | Status: DC

## 2022-01-22 NOTE — Telephone Encounter (Signed)
Patient called advising that he wished to cancel appt today with Roney Marion, PA due to not wanting to see a male. I canceled appt and advised the clinical staff of same. Dr. Alyson Ingles advised that he would see patient to discuss his concerns. I contacted patient advising him of same. Patient advised

## 2022-01-28 ENCOUNTER — Other Ambulatory Visit: Payer: Self-pay | Admitting: Internal Medicine

## 2022-01-28 DIAGNOSIS — F339 Major depressive disorder, recurrent, unspecified: Secondary | ICD-10-CM

## 2022-02-06 DIAGNOSIS — E1140 Type 2 diabetes mellitus with diabetic neuropathy, unspecified: Secondary | ICD-10-CM | POA: Diagnosis not present

## 2022-02-09 ENCOUNTER — Telehealth: Payer: Self-pay

## 2022-02-09 NOTE — Telephone Encounter (Signed)
Pt called stating he has been billed for CGM analysis from his last visit, at the time of his visit his CGM was through an app on his phone which we were not able to connect with or upload his CGM data from. He states that Dr.Nida looked at his phone while in the room but there was no printed analysis created or given to pt. Pt is very upset about his visit and the charges that were filed and sent to his insurance company and himself. Will you please give him a call at [phone].

## 2022-02-11 DIAGNOSIS — N529 Male erectile dysfunction, unspecified: Secondary | ICD-10-CM | POA: Diagnosis not present

## 2022-02-11 DIAGNOSIS — N50812 Left testicular pain: Secondary | ICD-10-CM | POA: Diagnosis not present

## 2022-02-11 NOTE — Telephone Encounter (Signed)
Patient is calling back in regards to not hearing from the office manager yet, informed him we sent a message and I would send another. He is requesting to be called back today and at the latest tomorrow as he is going out of town he said. Please advise.   [phone]

## 2022-02-22 ENCOUNTER — Other Ambulatory Visit: Payer: Self-pay

## 2022-02-22 ENCOUNTER — Encounter: Payer: Self-pay | Admitting: Internal Medicine

## 2022-02-22 DIAGNOSIS — I10 Essential (primary) hypertension: Secondary | ICD-10-CM

## 2022-02-22 MED ORDER — HYDROCHLOROTHIAZIDE 25 MG PO TABS: 25.0000 mg | ORAL_TABLET | Freq: Every morning | ORAL | 2 refills | Status: DC

## 2022-03-01 DIAGNOSIS — D45 Polycythemia vera: Secondary | ICD-10-CM | POA: Diagnosis not present

## 2022-03-01 DIAGNOSIS — D751 Secondary polycythemia: Secondary | ICD-10-CM | POA: Diagnosis not present

## 2022-03-02 ENCOUNTER — Encounter: Payer: Self-pay | Admitting: Internal Medicine

## 2022-03-02 ENCOUNTER — Other Ambulatory Visit: Payer: Self-pay | Admitting: Internal Medicine

## 2022-03-02 DIAGNOSIS — E1142 Type 2 diabetes mellitus with diabetic polyneuropathy: Secondary | ICD-10-CM

## 2022-03-02 MED ORDER — CLOPIDOGREL BISULFATE 75 MG PO TABS
75.0000 mg | ORAL_TABLET | Freq: Every day | ORAL | 3 refills | Status: DC
Start: 2022-03-02 — End: 2023-09-19

## 2022-03-02 MED ORDER — HYDROCODONE-ACETAMINOPHEN 10-325 MG PO TABS: 1.0000 | ORAL_TABLET | Freq: Four times a day (QID) | ORAL | 0 refills | Status: DC | PRN

## 2022-03-03 DIAGNOSIS — D45 Polycythemia vera: Secondary | ICD-10-CM | POA: Diagnosis not present

## 2022-03-04 ENCOUNTER — Encounter: Payer: Self-pay | Admitting: Cardiology

## 2022-03-08 DIAGNOSIS — E1140 Type 2 diabetes mellitus with diabetic neuropathy, unspecified: Secondary | ICD-10-CM | POA: Diagnosis not present

## 2022-03-15 DIAGNOSIS — N50812 Left testicular pain: Secondary | ICD-10-CM | POA: Diagnosis not present

## 2022-03-18 DIAGNOSIS — D751 Secondary polycythemia: Secondary | ICD-10-CM | POA: Diagnosis not present

## 2022-04-05 ENCOUNTER — Other Ambulatory Visit: Payer: Self-pay | Admitting: Internal Medicine

## 2022-04-05 ENCOUNTER — Other Ambulatory Visit: Payer: Self-pay | Admitting: *Deleted

## 2022-04-05 ENCOUNTER — Encounter: Payer: Self-pay | Admitting: Internal Medicine

## 2022-04-05 ENCOUNTER — Encounter: Payer: Self-pay | Admitting: Cardiology

## 2022-04-05 DIAGNOSIS — E782 Mixed hyperlipidemia: Secondary | ICD-10-CM

## 2022-04-05 MED ORDER — EZETIMIBE 10 MG PO TABS: 10.0000 mg | ORAL_TABLET | Freq: Every day | ORAL | 0 refills | Status: DC

## 2022-04-06 ENCOUNTER — Encounter: Payer: Self-pay | Admitting: Internal Medicine

## 2022-04-06 MED ORDER — ROSUVASTATIN CALCIUM 5 MG PO TABS: 5.0000 mg | ORAL_TABLET | Freq: Every day | ORAL | 3 refills | Status: DC

## 2022-04-07 ENCOUNTER — Encounter: Payer: Self-pay | Admitting: Internal Medicine

## 2022-04-07 ENCOUNTER — Other Ambulatory Visit: Payer: Self-pay | Admitting: Internal Medicine

## 2022-04-07 DIAGNOSIS — E1140 Type 2 diabetes mellitus with diabetic neuropathy, unspecified: Secondary | ICD-10-CM

## 2022-04-07 DIAGNOSIS — E1142 Type 2 diabetes mellitus with diabetic polyneuropathy: Secondary | ICD-10-CM

## 2022-04-07 DIAGNOSIS — I7381 Erythromelalgia: Secondary | ICD-10-CM

## 2022-04-07 NOTE — Telephone Encounter (Signed)
Please let patient know labs ordered and schedule an appointment

## 2022-04-07 NOTE — Telephone Encounter (Signed)
Labs have just been completed recently please advise

## 2022-04-07 NOTE — Telephone Encounter (Signed)
Awaiting providers response to last mychart message

## 2022-04-08 DIAGNOSIS — I7381 Erythromelalgia: Secondary | ICD-10-CM | POA: Diagnosis not present

## 2022-04-08 DIAGNOSIS — E782 Mixed hyperlipidemia: Secondary | ICD-10-CM | POA: Diagnosis not present

## 2022-04-08 DIAGNOSIS — E1142 Type 2 diabetes mellitus with diabetic polyneuropathy: Secondary | ICD-10-CM | POA: Diagnosis not present

## 2022-04-08 DIAGNOSIS — E1140 Type 2 diabetes mellitus with diabetic neuropathy, unspecified: Secondary | ICD-10-CM | POA: Diagnosis not present

## 2022-04-08 DIAGNOSIS — Z794 Long term (current) use of insulin: Secondary | ICD-10-CM | POA: Diagnosis not present

## 2022-04-09 ENCOUNTER — Other Ambulatory Visit: Payer: Self-pay

## 2022-04-09 ENCOUNTER — Encounter: Payer: Self-pay | Admitting: Internal Medicine

## 2022-04-09 DIAGNOSIS — E782 Mixed hyperlipidemia: Secondary | ICD-10-CM

## 2022-04-09 LAB — CBC WITH DIFFERENTIAL/PLATELET
Basophils Absolute: 0 10*3/uL (ref 0.0–0.2)
Basos: 1 %
EOS (ABSOLUTE): 0.2 10*3/uL (ref 0.0–0.4)
Eos: 3 %
Hematocrit: 46.7 % (ref 37.5–51.0)
Hemoglobin: 14.6 g/dL (ref 13.0–17.7)
Immature Grans (Abs): 0 10*3/uL (ref 0.0–0.1)
Immature Granulocytes: 0 %
Lymphocytes Absolute: 1.3 10*3/uL (ref 0.7–3.1)
Lymphs: 21 %
MCH: 23.7 pg — ABNORMAL LOW (ref 26.6–33.0)
MCHC: 31.3 g/dL — ABNORMAL LOW (ref 31.5–35.7)
MCV: 76 fL — ABNORMAL LOW (ref 79–97)
Monocytes Absolute: 0.5 10*3/uL (ref 0.1–0.9)
Monocytes: 9 %
Neutrophils Absolute: 4.1 10*3/uL (ref 1.4–7.0)
Neutrophils: 66 %
Platelets: 229 10*3/uL (ref 150–450)
RBC: 6.17 x10E6/uL — ABNORMAL HIGH (ref 4.14–5.80)
RDW: 19.6 % — ABNORMAL HIGH (ref 11.6–15.4)
WBC: 6.2 10*3/uL (ref 3.4–10.8)

## 2022-04-09 LAB — CMP14+EGFR
ALT: 18 IU/L (ref 0–44)
AST: 28 IU/L (ref 0–40)
Albumin/Globulin Ratio: 1.7 (ref 1.2–2.2)
Albumin: 4.3 g/dL (ref 3.8–4.9)
Alkaline Phosphatase: 66 IU/L (ref 44–121)
BUN/Creatinine Ratio: 19 (ref 9–20)
BUN: 29 mg/dL — ABNORMAL HIGH (ref 6–24)
Bilirubin Total: 0.5 mg/dL (ref 0.0–1.2)
CO2: 21 mmol/L (ref 20–29)
Calcium: 9.4 mg/dL (ref 8.7–10.2)
Chloride: 98 mmol/L (ref 96–106)
Creatinine, Ser: 1.54 mg/dL — ABNORMAL HIGH (ref 0.76–1.27)
Globulin, Total: 2.5 g/dL (ref 1.5–4.5)
Glucose: 157 mg/dL — ABNORMAL HIGH (ref 70–99)
Potassium: 4.5 mmol/L (ref 3.5–5.2)
Sodium: 138 mmol/L (ref 134–144)
Total Protein: 6.8 g/dL (ref 6.0–8.5)
eGFR: 52 mL/min/{1.73_m2} — ABNORMAL LOW (ref >59)

## 2022-04-09 LAB — HEMOGLOBIN A1C
Est. average glucose Bld gHb Est-mCnc: 189 mg/dL
Hgb A1c MFr Bld: 8.2 % — ABNORMAL HIGH (ref 4.8–5.6)

## 2022-04-09 NOTE — Telephone Encounter (Signed)
Lab added

## 2022-04-13 LAB — LIPID PANEL
Chol/HDL Ratio: 14.5 ratio — ABNORMAL HIGH (ref 0.0–5.0)
Cholesterol, Total: 333 mg/dL — ABNORMAL HIGH (ref 100–199)
HDL: 23 mg/dL — ABNORMAL LOW (ref >39)
Triglycerides: 1296 mg/dL (ref 0–149)

## 2022-04-13 LAB — SPECIMEN STATUS REPORT

## 2022-04-14 ENCOUNTER — Encounter: Payer: Self-pay | Admitting: Internal Medicine

## 2022-04-14 ENCOUNTER — Ambulatory Visit (INDEPENDENT_AMBULATORY_CARE_PROVIDER_SITE_OTHER): Payer: Medicare Other | Admitting: Internal Medicine

## 2022-04-14 VITALS — BP 138/82 | HR 72 | Ht 69.0 in | Wt 180.0 lb

## 2022-04-14 DIAGNOSIS — F339 Major depressive disorder, recurrent, unspecified: Secondary | ICD-10-CM

## 2022-04-14 DIAGNOSIS — E1140 Type 2 diabetes mellitus with diabetic neuropathy, unspecified: Secondary | ICD-10-CM

## 2022-04-14 DIAGNOSIS — D45 Polycythemia vera: Secondary | ICD-10-CM

## 2022-04-14 DIAGNOSIS — E1142 Type 2 diabetes mellitus with diabetic polyneuropathy: Secondary | ICD-10-CM

## 2022-04-14 DIAGNOSIS — E782 Mixed hyperlipidemia: Secondary | ICD-10-CM

## 2022-04-14 DIAGNOSIS — T466X5A Adverse effect of antihyperlipidemic and antiarteriosclerotic drugs, initial encounter: Secondary | ICD-10-CM

## 2022-04-14 DIAGNOSIS — Z2821 Immunization not carried out because of patient refusal: Secondary | ICD-10-CM

## 2022-04-14 DIAGNOSIS — I10 Essential (primary) hypertension: Secondary | ICD-10-CM

## 2022-04-14 DIAGNOSIS — Z794 Long term (current) use of insulin: Secondary | ICD-10-CM

## 2022-04-14 DIAGNOSIS — M609 Myositis, unspecified: Secondary | ICD-10-CM | POA: Diagnosis not present

## 2022-04-14 NOTE — Assessment & Plan Note (Signed)
PatientJAK2 negative Gets venipuncture every 3-4 months F/u Heme/Onc.

## 2022-04-14 NOTE — Assessment & Plan Note (Signed)
BP Readings from Last 1 Encounters:  04/14/22 138/82   Better controlled with Metoprolol, Losartan and HCTZ Counseled for compliance with the medications Advised DASH diet and moderate exercise/walking, at least 150 mins/week

## 2022-04-14 NOTE — Assessment & Plan Note (Signed)
Continue Zetia and Vascepa Intolerant to statin and Repatha  May try Praluent, discuss with Cardiology 

## 2022-04-14 NOTE — Assessment & Plan Note (Signed)
Had severe myalgia and worsening of neuropathy pain with statin Has tried 2 different statins in the past - pravastatin, simvastatin and now Crestor

## 2022-04-14 NOTE — Progress Notes (Signed)
Established Patient Office Visit  Subjective:  Patient ID: [name], male    DOB: 12/25/1960  Age: 61 y.o. MRN: [id number]  CC:  Chief Complaint  Patient presents with   Follow-up    Lab review,cholestrol medication issues    HPI [name] is a 61 y.o. male with past medical history of CAD status post CABG, hypertension, uncontrolled diabetes mellitus-on insulin, severe diabetic neuropathy, chronic pancreatitis, polycythemia vera, hyperlipidemia, cervical spinal stenosis and chronic back pain who presents for f/u of his chronic medical conditions.  HTN: BP is better controlled now. Takes medications regularly. Patient denies headache, dizziness, chest pain, dyspnea or palpitations.   DM: HbA1C is 8.2 now. He had visit with Dr Dorris Fetch and his insulin dose was increased to 60 U BID. He also takes Metformin. Denies any polyuria or polyphagia currently.  He has CGM device, and his blood glucose runs around 150-200 most of the time.  HLD: His recent lipid profile showed significantly elevated TG and LDL.  Of note, he was placed on Crestor and fenofibrate by his cardiologist, but he did not tolerate any of them.  He is currently taking Zetia and Vascepa, but compliance is questionable.  Diabetic neuropathy: Takes gabapentin and as needed Norco for severe pain.  He has tried gabapentin cream as well for neuropathy, but continues to have severe burning pain in his feet.         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  Patent coronary bypass grafts with a free radial graft to PDA and LIMA to the  diag.   Diabetes mellitus    Diabetic peripheral neuropathy ([place]) 05/06/2020   Dyslipidemia    Poorly controlled, probably related to his DM (Some of this is secondary to his  inability to afford medications).   ED (erectile dysfunction)    Esophageal yeast infection ([place])    Hypertension    Metaplasia of esophagus    Pancreatitis chronic    Pseudocyst   Polycythemia vera(238.4) 04/26/2012   PONV (postoperative nausea and vomiting)     Past Surgical History:  Procedure Laterality Date   APPENDECTOMY     BIOPSY  11/11/2021   Procedure: BIOPSY;  Surgeon: Rogene Houston, MD;  [facility];  Service: Endoscopy;;   CARDIAC CATHETERIZATION     CHOLECYSTECTOMY     COLONOSCOPY  07/31/1985   COLONOSCOPY N/A 03/11/2016   Procedure: COLONOSCOPY;  Surgeon: Rogene Houston, MD;  [facility];  Service: Endoscopy;  Laterality: N/A;   COLONOSCOPY WITH PROPOFOL N/A 11/11/2021   Procedure: COLONOSCOPY WITH PROPOFOL;  Surgeon: Rogene Houston, MD;  [facility];  Service: Endoscopy;  Laterality: N/A;  205   CORONARY ANGIOPLASTY WITH STENT PLACEMENT  2006   CORONARY ARTERY BYPASS GRAFT  11/24/2004   ESOPHAGOGASTRODUODENOSCOPY  09/19/01   ESOPHAGOGASTRODUODENOSCOPY N/A 03/11/2016   Procedure: ESOPHAGOGASTRODUODENOSCOPY (EGD);  Surgeon: Rogene Houston, MD;  [facility];  Service: Endoscopy;  Laterality: N/A;  12:00   ESOPHAGOGASTRODUODENOSCOPY (EGD) WITH PROPOFOL N/A 11/11/2021   Procedure: ESOPHAGOGASTRODUODENOSCOPY (EGD) WITH PROPOFOL;  Surgeon: Rogene Houston, MD;  [facility];  Service: Endoscopy;  Laterality: N/A;   FINGER  TENDON REPAIR     in middle finger   KNEE SURGERY     left knee   NASAL SEPTUM SURGERY     POLYPECTOMY  11/11/2021   Procedure: POLYPECTOMY;  Surgeon: Rogene Houston, MD;  [facility];  Service: Endoscopy;;    Family History  Problem Relation Age of Onset   Coronary artery disease Mother        strong family history of   Heart attack Mother        dying of (at age 9)   Diabetes Mother    Diabetes Sister    Diabetes Brother    Pancreatitis Brother    Healthy Daughter     Hyperlipidemia Daughter    Heart disease Brother    Hypertension Brother    Hyperlipidemia Brother     Social History   Socioeconomic History   Marital status: Married    Spouse name: Not on file   Number of children: Not on file   Years of education: Not on file   Highest education level: Not on file  Occupational History   Not on file  Tobacco Use   Smoking status: Never   Smokeless tobacco: Never  Substance and Sexual Activity   Alcohol use: Not Currently    Comment: Patient may drink a beer or glass of wine a couple times a month   Drug use: No   Sexual activity: Not on file  Other Topics Concern   Not on file  Social History Narrative   The patient lives in [place] with his wife.  He is     disabled secondary to coronary artery disease and pancreatitis.  He does     not smoke, he does not drink alcohol.  There is no drug use.    Social Determinants of Health   Financial Resource Strain: Low Risk  (11/02/2021)   Overall Financial Resource Strain (CARDIA)    Difficulty of Paying Living Expenses: Not hard at all  Food Insecurity: No Food Insecurity (11/02/2021)   Hunger Vital Sign    Worried About Running Out of Food in the Last Year: Never true    Ran Out of Food in the Last Year: Never true  Transportation Needs: No Transportation Needs (11/02/2021)   PRAPARE - Hydrologist (Medical): No    Lack of Transportation (Non-Medical): No  Physical Activity: Inactive (11/02/2021)   Exercise Vital Sign    Days of Exercise per Week: 0 days    Minutes of Exercise per Session: 0 min  Stress: No Stress Concern Present (11/02/2021)   [place]    Feeling of Stress : Not at all  Social Connections: Moderately Integrated (11/02/2021)   Social Connection and Isolation Panel [NHANES]    Frequency of Communication with Friends and Family: Three times a week    Frequency of Social  Gatherings with Friends and Family: Not on file    Attends Religious Services: More than 4 times per year    Active Member of [employer] or Organizations: No    Attends Archivist Meetings: Never    Marital Status: Married  Human resources officer Violence: Not on file    Outpatient Medications Prior to Visit  Medication Sig Dispense Refill   Cholecalciferol 125 MCG (5000 UT) TABS Take 10,000 Units by mouth daily.     clopidogrel (PLAVIX) 75 MG tablet Take 1 tablet (75 mg total) by mouth daily.  90 tablet 3   Continuous Blood Gluc Receiver (DEXCOM G7 RECEIVER) DEVI USE TO CHECK GLUCOSE AS NEEDED 1 each 0   Continuous Blood Gluc Sensor (DEXCOM G7 SENSOR) MISC CHANGE SENSOR EVERY 10 DAYS 3 each 2   Continuous Blood Gluc Transmit (DEXCOM G6 TRANSMITTER) MISC Use 1 transmitter every 90 days. 1 each 1   diazepam (VALIUM) 10 MG tablet Take 0.5 tablets (5 mg total) by mouth at bedtime as needed for anxiety or sleep. 30 tablet 0   ezetimibe (ZETIA) 10 MG tablet Take 1 tablet (10 mg total) by mouth daily. 90 tablet 0   gabapentin (NEURONTIN) 300 MG capsule Take 2 capsules (600 mg total) by mouth 3 (three) times daily. 540 capsule 1   glucose blood (FREESTYLE TEST STRIPS) test strip Use as instructed 100 each 12   hydrochlorothiazide (HYDRODIURIL) 25 MG tablet Take 1 tablet (25 mg total) by mouth in the morning. 90 tablet 2   HYDROcodone-acetaminophen (NORCO) 10-325 MG tablet Take 1 tablet by mouth every 6 (six) hours as needed for severe pain. 120 tablet 0   icosapent Ethyl (VASCEPA) 1 g capsule Take 2 capsules (2 g total) by mouth 2 (two) times daily. 360 capsule 3   insulin NPH-regular Human (NOVOLIN 70/30) (70-30) 100 UNIT/ML injection Inject 60 Units into the skin 2 (two) times daily before a meal. When glucose is above 90 and eating     lansoprazole (PREVACID) 30 MG capsule Take 1 capsule (30 mg total) by mouth daily at 12 noon. 90 capsule 3   levofloxacin (LEVAQUIN) 500 MG tablet Take 1 tablet (500  mg total) by mouth daily. 7 tablet 0   losartan (COZAAR) 50 MG tablet Take 1 tablet (50 mg total) by mouth daily. (Patient taking differently: Take 25 mg by mouth at bedtime.) 90 tablet 3   metFORMIN (GLUCOPHAGE-XR) 500 MG 24 hr tablet Take 1 tablet (500 mg total) by mouth daily with breakfast. 90 tablet 1   methocarbamol (ROBAXIN) 500 MG tablet TAKE 1 TABLET BY MOUTH EVERY 8 HOURS AS NEEDED FOR MUSCLE SPASMS. 30 tablet 0   metoprolol tartrate (LOPRESSOR) 25 MG tablet TAKE 1 TABLET BY MOUTH TWICE A DAY. 60 tablet 5   nitroGLYCERIN (NITROSTAT) 0.4 MG SL tablet Place 0.4 mg under the tongue every 5 (five) minutes as needed for chest pain. Reported on 02/26/2016     NON FORMULARY Take 20 mg by mouth daily. Domperidone (Motilium) 10 mg     NONFORMULARY OR COMPOUNDED ITEM Apply 1 application. topically daily as needed (neuropathy). Compounded cream for foot pain. Gets at [employer].     ondansetron (ZOFRAN) 4 MG tablet TAKE (1) TABLET BY MOUTH (3) TIMES DAILY AS NEEDED FOR NAUSEA. 30 tablet 0   PARoxetine (PAXIL) 10 MG tablet TAKE ONE TABLET BY MOUTH ONCE DAILY FOR DEPRESSION. 90 tablet 0   polyethylene glycol (MIRALAX / GLYCOLAX) 17 g packet Take 17 g by mouth daily as needed for moderate constipation.     promethazine (PHENERGAN) 25 MG tablet Take 25 mg by mouth every 6 (six) hours as needed for nausea or vomiting. Take 1/4 to 1/2 tablet by mouth as needed     rosuvastatin (CRESTOR) 5 MG tablet Take 1 tablet (5 mg total) by mouth daily. 30 tablet 3   tadalafil (CIALIS) 5 MG tablet Take 1-4 tabs po prn.  Don't take within 4 hours of tamsulosin or with the NTG. 30 tablet 5   tamsulosin (FLOMAX) 0.4 MG CAPS capsule Take 0.4  mg by mouth daily.     Bempedoic Acid 180 MG TABS Take 180 mg by mouth daily. (Patient not taking: Reported on 12/22/2021) 30 tablet 11   fenofibrate micronized (LOFIBRA) 67 MG capsule Take 1 capsule (67 mg total) by mouth daily before breakfast. (Patient not taking: Reported on  01/19/2022) 90 capsule 4   No facility-administered medications prior to visit.    Allergies  Allergen Reactions   Iodinated Contrast Media Hives, Itching and Nausea Only    Flushing, Burning, itching Other reaction(s): Angioedema (ALLERGY/intolerance)    Iohexol Hives and Shortness Of Breath     Desc: HIVES,SOB NEEDS PREP.MEDS    Metoclopramide Hcl Hives    Patient became very spastic   Alfuzosin Hcl Er     Dropped blood pressure   Sulfonamide Derivatives Hives    Large hives   Enalapril Cough   Penicillins Rash    Has patient had a PCN reaction causing immediate rash, facial/tongue/throat swelling, SOB or lightheadedness with hypotension: Yes Has patient had a PCN reaction causing severe rash involving mucus membranes or skin necrosis: No Has patient had a PCN reaction that required hospitalization No Has patient had a PCN reaction occurring within the last 10 years: No. If all of the above answers are "NO", then may proceed with Cephalosporin use.    Sulfa Antibiotics Rash    Other Reaction: Not Assessed    ROS Review of Systems  Constitutional:  Negative for chills and fever.  HENT:  Negative for congestion and sore throat.   Eyes:  Negative for pain and discharge.  Respiratory:  Negative for cough and shortness of breath.   Cardiovascular:  Negative for palpitations.  Gastrointestinal:  Negative for constipation, diarrhea, nausea and vomiting.  Endocrine: Negative for polydipsia and polyuria.  Genitourinary:  Positive for difficulty urinating. Negative for hematuria.  Musculoskeletal:  Positive for arthralgias, back pain and neck pain. Negative for neck stiffness.  Skin:  Negative for rash.  Neurological:  Positive for numbness (B/l LE, intermittent). Negative for dizziness, weakness and headaches.  Psychiatric/Behavioral:  Negative for agitation and behavioral problems.       Objective:    Physical Exam Vitals reviewed.  Constitutional:      General: He is  not in acute distress.    Appearance: He is not diaphoretic.  HENT:     Head: Normocephalic and atraumatic.     Nose: Nose normal.     Mouth/Throat:     Mouth: Mucous membranes are moist.  Eyes:     General: No scleral icterus.    Extraocular Movements: Extraocular movements intact.  Cardiovascular:     Rate and Rhythm: Normal rate and regular rhythm.     Pulses: Normal pulses.     Heart sounds: Normal heart sounds. No murmur heard. Pulmonary:     Breath sounds: Normal breath sounds. No wheezing or rales.  Musculoskeletal:     Cervical back: Neck supple. No tenderness.     Right lower leg: No edema.     Left lower leg: No edema.  Skin:    General: Skin is warm.     Findings: No rash.  Neurological:     General: No focal deficit present.     Mental Status: He is alert and oriented to person, place, and time.     Cranial Nerves: No cranial nerve deficit.     Sensory: Sensory deficit (B/l feet) present.     Motor: No weakness.  Psychiatric:  Mood and Affect: Mood normal.        Behavior: Behavior normal.     BP 138/82 (BP Location: Right Arm, Cuff Size: Normal)   Pulse 72   Ht $R'5\' 9"'Hh$  (1.753 m)   Wt 180 lb (81.6 kg)   SpO2 96%   BMI 26.58 kg/m  Wt Readings from Last 3 Encounters:  04/14/22 180 lb (81.6 kg)  01/19/22 179 lb 12.8 oz (81.6 kg)  01/07/22 179 lb 8 oz (81.4 kg)    Lab Results  Component Value Date   TSH 2.310 01/05/2022   Lab Results  Component Value Date   WBC 6.2 04/08/2022   HGB 14.6 04/08/2022   HCT 46.7 04/08/2022   MCV 76 (L) 04/08/2022   PLT 229 04/08/2022   Lab Results  Component Value Date   NA 138 04/08/2022   K 4.5 04/08/2022   CO2 21 04/08/2022   GLUCOSE 157 (H) 04/08/2022   BUN 29 (H) 04/08/2022   CREATININE 1.54 (H) 04/08/2022   BILITOT 0.5 04/08/2022   ALKPHOS 66 04/08/2022   AST 28 04/08/2022   ALT 18 04/08/2022   PROT 6.8 04/08/2022   ALBUMIN 4.3 04/08/2022   CALCIUM 9.4 04/08/2022   ANIONGAP 13 11/06/2021   EGFR  52 (L) 04/08/2022   Lab Results  Component Value Date   CHOL 333 (H) 04/08/2022   Lab Results  Component Value Date   HDL 23 (L) 04/08/2022   Lab Results  Component Value Date   LDLCALC Comment (A) 04/08/2022   Lab Results  Component Value Date   TRIG 1,296 (HH) 04/08/2022   Lab Results  Component Value Date   CHOLHDL 14.5 (H) 04/08/2022   Lab Results  Component Value Date   HGBA1C 8.2 (H) 04/08/2022      Assessment & Plan:   Problem List Items Addressed This Visit       Cardiovascular and Mediastinum   Essential hypertension, benign    BP Readings from Last 1 Encounters:  04/14/22 138/82  Better controlled with Metoprolol, Losartan and HCTZ Counseled for compliance with the medications Advised DASH diet and moderate exercise/walking, at least 150 mins/week        Endocrine   Diabetic peripheral neuropathy (HCC)    Long-standing, severe b/l LE pain On Gabapentin 400 mg TID and Norco PRN Still uncontrolled, but better compared to prior, had increased dose of Gabapentin to 600 mg TID SSRI and Valium for chronic pain and anxiety related to neuropathy Has Gabapentin cream for neuropathy      Type 2 diabetes mellitus with diabetic neuropathy, with long-term current use of insulin (HCC)    Lab Results  Component Value Date   HGBA1C 8.2 (H) 04/08/2022  On Novolin 60 U BID and Metformin, f/u with Dr Dorris Fetch Advised to follow diabetic diet Not on any statin due to statin intolerance Diabetic eye exam: Advised to follow up with Ophthalmology for diabetic eye exam        Musculoskeletal and Integument   Statin-induced myositis - Primary    Had severe myalgia and worsening of neuropathy pain with statin Has tried 2 different statins in the past - pravastatin, simvastatin and now Crestor        Other   Polycythemia vera ([place])    PatientJAK2 negative Gets venipuncture every 3-4 months F/u Heme/Onc.      Mixed hyperlipidemia    Continue Zetia and  Vascepa Intolerant to statin and Repatha  May try Praluent, discuss with Cardiology  Depression, recurrent ([place])    Well-controlled with Paxil      Other Visit Diagnoses     Refused influenza vaccine           No orders of the defined types were placed in this encounter.   Follow-up: Return in about 4 months (around 08/14/2022) for DM and HTN.    Lindell Spar, MD

## 2022-04-14 NOTE — Assessment & Plan Note (Signed)
Well-controlled with Paxil

## 2022-04-14 NOTE — Patient Instructions (Addendum)
Please continue taking medications as prescribed.  Please continue to follow low carb diet and ambulate as tolerated.  Please try to fill out form for Praluent patient assistance.

## 2022-04-14 NOTE — Assessment & Plan Note (Addendum)
Lab Results  Component Value Date   HGBA1C 8.2 (H) 04/08/2022   On Novolin 60 U BID and Metformin, f/u with Dr Dorris Fetch Advised to follow diabetic diet Not on any statin due to statin intolerance Diabetic eye exam: Advised to follow up with Ophthalmology for diabetic eye exam

## 2022-04-14 NOTE — Assessment & Plan Note (Signed)
Long-standing, severe b/l LE pain On Gabapentin 400 mg TID and Norco PRN Still uncontrolled, but better compared to prior, had increased dose of Gabapentin to 600 mg TID SSRI and Valium for chronic pain and anxiety related to neuropathy Has Gabapentin cream for neuropathy 

## 2022-04-29 ENCOUNTER — Encounter: Payer: Self-pay | Admitting: "Endocrinology

## 2022-04-29 ENCOUNTER — Encounter: Payer: Self-pay | Admitting: Internal Medicine

## 2022-04-30 ENCOUNTER — Other Ambulatory Visit: Payer: Self-pay | Admitting: Internal Medicine

## 2022-04-30 ENCOUNTER — Other Ambulatory Visit: Payer: Self-pay

## 2022-04-30 ENCOUNTER — Other Ambulatory Visit: Payer: Self-pay | Admitting: Family Medicine

## 2022-04-30 ENCOUNTER — Telehealth: Payer: Self-pay | Admitting: Internal Medicine

## 2022-04-30 ENCOUNTER — Other Ambulatory Visit: Payer: Self-pay | Admitting: *Deleted

## 2022-04-30 ENCOUNTER — Telehealth: Payer: Self-pay | Admitting: Family Medicine

## 2022-04-30 DIAGNOSIS — F339 Major depressive disorder, recurrent, unspecified: Secondary | ICD-10-CM

## 2022-04-30 DIAGNOSIS — E1142 Type 2 diabetes mellitus with diabetic polyneuropathy: Secondary | ICD-10-CM

## 2022-04-30 DIAGNOSIS — E1140 Type 2 diabetes mellitus with diabetic neuropathy, unspecified: Secondary | ICD-10-CM

## 2022-04-30 MED ORDER — PAROXETINE HCL 10 MG PO TABS: ORAL_TABLET | ORAL | 0 refills | Status: DC

## 2022-04-30 MED ORDER — HYDROCODONE-ACETAMINOPHEN 10-325 MG PO TABS: ORAL_TABLET | ORAL | 0 refills | Status: DC

## 2022-04-30 MED ORDER — METFORMIN HCL ER 500 MG PO TB24: 500.0000 mg | ORAL_TABLET | Freq: Every day | ORAL | 0 refills | Status: DC

## 2022-04-30 NOTE — Progress Notes (Signed)
60 hydrocodone prescribed , Dr [name] will be notified and f/ u with patient

## 2022-04-30 NOTE — Telephone Encounter (Signed)
I prescribe what may be a 30 day supplly or a 15 day supply of pain medication for this patient. Pls review and follow up with him

## 2022-04-30 NOTE — Telephone Encounter (Signed)
Patient needs refill on HYDROcodone-acetaminophen (NORCO) 10-325 MG tablet    Wants a call back in regard

## 2022-05-03 ENCOUNTER — Ambulatory Visit: Payer: Medicare Other | Admitting: Internal Medicine

## 2022-05-07 DIAGNOSIS — E1140 Type 2 diabetes mellitus with diabetic neuropathy, unspecified: Secondary | ICD-10-CM | POA: Diagnosis not present

## 2022-06-06 DIAGNOSIS — E1140 Type 2 diabetes mellitus with diabetic neuropathy, unspecified: Secondary | ICD-10-CM | POA: Diagnosis not present

## 2022-06-18 ENCOUNTER — Telehealth: Payer: Self-pay | Admitting: Cardiology

## 2022-06-18 MED ORDER — LOSARTAN POTASSIUM 50 MG PO TABS: 50.0000 mg | ORAL_TABLET | Freq: Every day | ORAL | 1 refills | Status: DC

## 2022-06-18 NOTE — Telephone Encounter (Signed)
*  STAT* If patient is at the pharmacy, call can be transferred to refill team.   1. Which medications need to be refilled? (please list name of each medication and dose if known)   losartan (COZAAR) 50 MG tablet (Expired)  2. Which pharmacy/location (including street and city if local pharmacy) is medication to be sent to?  [facility], [address]  3. Do they need a 30 day or 90 day supply?     Caller stated he will need clarification on the dosage for this medication.  Ref# [id number]

## 2022-06-23 ENCOUNTER — Other Ambulatory Visit: Payer: Self-pay | Admitting: Internal Medicine

## 2022-06-23 DIAGNOSIS — E1142 Type 2 diabetes mellitus with diabetic polyneuropathy: Secondary | ICD-10-CM

## 2022-06-23 MED ORDER — HYDROCODONE-ACETAMINOPHEN 10-325 MG PO TABS: 1.0000 | ORAL_TABLET | Freq: Four times a day (QID) | ORAL | 0 refills | Status: DC | PRN

## 2022-06-25 ENCOUNTER — Encounter: Payer: Self-pay | Admitting: Internal Medicine

## 2022-07-03 ENCOUNTER — Encounter (INDEPENDENT_AMBULATORY_CARE_PROVIDER_SITE_OTHER): Payer: Self-pay | Admitting: Gastroenterology

## 2022-07-06 DIAGNOSIS — E1140 Type 2 diabetes mellitus with diabetic neuropathy, unspecified: Secondary | ICD-10-CM | POA: Diagnosis not present

## 2022-07-08 ENCOUNTER — Other Ambulatory Visit: Payer: Self-pay | Admitting: Internal Medicine

## 2022-07-08 ENCOUNTER — Ambulatory Visit: Payer: Medicare Other | Admitting: Urology

## 2022-07-08 DIAGNOSIS — E782 Mixed hyperlipidemia: Secondary | ICD-10-CM

## 2022-07-08 MED ORDER — EZETIMIBE 10 MG PO TABS: 10.0000 mg | ORAL_TABLET | Freq: Every day | ORAL | 0 refills | Status: DC

## 2022-07-14 ENCOUNTER — Encounter: Payer: Self-pay | Admitting: "Endocrinology

## 2022-07-21 DIAGNOSIS — Z794 Long term (current) use of insulin: Secondary | ICD-10-CM | POA: Diagnosis not present

## 2022-07-21 DIAGNOSIS — E1140 Type 2 diabetes mellitus with diabetic neuropathy, unspecified: Secondary | ICD-10-CM | POA: Diagnosis not present

## 2022-07-22 ENCOUNTER — Encounter: Payer: Self-pay | Admitting: "Endocrinology

## 2022-07-22 ENCOUNTER — Ambulatory Visit: Payer: Medicare Other | Admitting: "Endocrinology

## 2022-07-22 VITALS — BP 130/82 | HR 68 | Ht 69.0 in | Wt 176.4 lb

## 2022-07-22 DIAGNOSIS — E1140 Type 2 diabetes mellitus with diabetic neuropathy, unspecified: Secondary | ICD-10-CM | POA: Diagnosis not present

## 2022-07-22 DIAGNOSIS — Z789 Other specified health status: Secondary | ICD-10-CM | POA: Diagnosis not present

## 2022-07-22 DIAGNOSIS — Z794 Long term (current) use of insulin: Secondary | ICD-10-CM

## 2022-07-22 DIAGNOSIS — E782 Mixed hyperlipidemia: Secondary | ICD-10-CM

## 2022-07-22 LAB — LIPID PANEL
Chol/HDL Ratio: 9.3 ratio — ABNORMAL HIGH (ref 0.0–5.0)
Cholesterol, Total: 222 mg/dL — ABNORMAL HIGH (ref 100–199)
HDL: 24 mg/dL — ABNORMAL LOW (ref >39)
Triglycerides: 852 mg/dL (ref 0–149)

## 2022-07-22 LAB — COMPREHENSIVE METABOLIC PANEL
ALT: 15 IU/L (ref 0–44)
AST: 22 IU/L (ref 0–40)
Albumin/Globulin Ratio: 1.7 (ref 1.2–2.2)
Albumin: 4.3 g/dL (ref 3.8–4.9)
Alkaline Phosphatase: 74 IU/L (ref 44–121)
BUN/Creatinine Ratio: 25 — ABNORMAL HIGH (ref 9–20)
BUN: 39 mg/dL — ABNORMAL HIGH (ref 6–24)
Bilirubin Total: 0.6 mg/dL (ref 0.0–1.2)
CO2: 21 mmol/L (ref 20–29)
Calcium: 9.4 mg/dL (ref 8.7–10.2)
Chloride: 93 mmol/L — ABNORMAL LOW (ref 96–106)
Creatinine, Ser: 1.55 mg/dL — ABNORMAL HIGH (ref 0.76–1.27)
Globulin, Total: 2.5 g/dL (ref 1.5–4.5)
Glucose: 217 mg/dL — ABNORMAL HIGH (ref 70–99)
Potassium: 4.3 mmol/L (ref 3.5–5.2)
Sodium: 133 mmol/L — ABNORMAL LOW (ref 134–144)
Total Protein: 6.8 g/dL (ref 6.0–8.5)
eGFR: 51 mL/min/{1.73_m2} — ABNORMAL LOW (ref >59)

## 2022-07-22 LAB — POCT GLYCOSYLATED HEMOGLOBIN (HGB A1C): HbA1c, POC (controlled diabetic range): 7.4 % — AB (ref 0.0–7.0)

## 2022-07-22 LAB — T4, FREE: Free T4: 1.15 ng/dL (ref 0.82–1.77)

## 2022-07-22 LAB — TSH: TSH: 3.48 u[IU]/mL (ref 0.450–4.500)

## 2022-07-22 MED ORDER — METFORMIN HCL ER 500 MG PO TB24: 500.0000 mg | ORAL_TABLET | Freq: Every day | ORAL | 1 refills | Status: DC

## 2022-07-22 NOTE — Progress Notes (Signed)
07/22/2022, 6:49 PM   Endocrinology follow-up note  Subjective:    Patient ID: [name], male    DOB: 1960/11/07.  [name] is being seen in  follow-up after he was seen in consultation for management of currently uncontrolled symptomatic diabetes, hyperlipidemia, exocrine pancreatic insufficiency. PMD:  Lindell Spar, MD.   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  Patent coronary bypass grafts with a free radial graft to PDA and LIMA to the  diag.   Diabetes mellitus    Diabetic peripheral neuropathy ([place]) 05/06/2020   Dyslipidemia    Poorly controlled, probably related to his DM (Some of this is secondary to his inability to afford medications).   ED (erectile dysfunction)    Esophageal yeast infection ([place])    Hypertension    Metaplasia of esophagus    Pancreatitis chronic    Pseudocyst   Polycythemia vera(238.4) 04/26/2012   PONV (postoperative nausea and vomiting)     Past Surgical History:  Procedure Laterality Date   APPENDECTOMY     BIOPSY  11/11/2021   Procedure: BIOPSY;  Surgeon: Rogene Houston, MD;  [facility];  Service: Endoscopy;;   CARDIAC CATHETERIZATION     CHOLECYSTECTOMY     COLONOSCOPY  07/31/1985   COLONOSCOPY N/A 03/11/2016   Procedure: COLONOSCOPY;  Surgeon: Rogene Houston, MD;  [facility];  Service: Endoscopy;  Laterality: N/A;   COLONOSCOPY WITH PROPOFOL N/A 11/11/2021   Procedure: COLONOSCOPY WITH PROPOFOL;  Surgeon: Rogene Houston, MD;  [facility];  Service: Endoscopy;  Laterality: N/A;  205   CORONARY ANGIOPLASTY WITH STENT PLACEMENT  2006   CORONARY ARTERY BYPASS GRAFT  11/24/2004   ESOPHAGOGASTRODUODENOSCOPY  09/19/01   ESOPHAGOGASTRODUODENOSCOPY N/A  03/11/2016   Procedure: ESOPHAGOGASTRODUODENOSCOPY (EGD);  Surgeon: Rogene Houston, MD;  [facility];  Service: Endoscopy;  Laterality: N/A;  12:00   ESOPHAGOGASTRODUODENOSCOPY (EGD) WITH PROPOFOL N/A 11/11/2021   Procedure: ESOPHAGOGASTRODUODENOSCOPY (EGD) WITH PROPOFOL;  Surgeon: Rogene Houston, MD;  [facility];  Service: Endoscopy;  Laterality: N/A;   FINGER TENDON REPAIR     in middle finger   KNEE SURGERY     left knee   NASAL SEPTUM SURGERY     POLYPECTOMY  11/11/2021   Procedure: POLYPECTOMY;  Surgeon: Rogene Houston, MD;  [facility];  Service: Endoscopy;;    Social History   Socioeconomic History   Marital status: Married    Spouse name: Not on file   Number of children: Not on file   Years of education: Not on file   Highest education level: Not on file  Occupational History   Not on file  Tobacco Use   Smoking status: Never   Smokeless tobacco: Never  Substance and Sexual Activity   Alcohol use: Not Currently    Comment: Patient may drink a beer or glass of wine a couple times a month   Drug use: No   Sexual activity: Not on file  Other Topics Concern   Not on file  Social History Narrative   The patient lives in [place] with his wife.  He is     disabled secondary to coronary artery disease and pancreatitis.  He does     not smoke, he does not drink alcohol.  There is no drug use.    Social Determinants of Health   Financial Resource Strain: Low Risk  (11/02/2021)   Overall Financial Resource Strain (CARDIA)    Difficulty of Paying Living Expenses: Not hard at all  Food Insecurity: No Food Insecurity (11/02/2021)   Hunger Vital Sign    Worried About Running Out of Food in the Last Year: Never true    Ran Out of Food in the Last Year: Never true  Transportation Needs: No Transportation Needs (11/02/2021)   PRAPARE - Hydrologist (Medical): No    Lack of Transportation (Non-Medical): No   Physical Activity: Inactive (11/02/2021)   Exercise Vital Sign    Days of Exercise per Week: 0 days    Minutes of Exercise per Session: 0 min  Stress: No Stress Concern Present (11/02/2021)   [place]    Feeling of Stress : Not at all  Social Connections: Moderately Integrated (11/02/2021)   Social Connection and Isolation Panel [NHANES]    Frequency of Communication with Friends and Family: Three times a week    Frequency of Social Gatherings with Friends and Family: Not on file    Attends Religious Services: More than 4 times per year    Active Member of [employer] or Organizations: No    Attends Music therapist: Never    Marital Status: Married    Family History  Problem Relation Age of Onset   Coronary artery disease Mother        strong family history of   Heart attack Mother        dying of (at age 44)   Diabetes Mother    Diabetes Sister    Diabetes Brother    Pancreatitis Brother    Healthy Daughter    Hyperlipidemia Daughter    Heart disease Brother    Hypertension Brother    Hyperlipidemia Brother     Outpatient Encounter Medications as of 07/22/2022  Medication Sig   Bempedoic Acid 180 MG TABS Take 180 mg by mouth daily. (Patient not taking: Reported on 12/22/2021)   Cholecalciferol 125 MCG (5000 UT) TABS Take 10,000 Units by mouth daily.   clopidogrel (PLAVIX) 75 MG tablet Take 1 tablet (75 mg total) by mouth daily.   Continuous Blood Gluc Receiver (DEXCOM G7 RECEIVER) DEVI USE TO CHECK GLUCOSE AS NEEDED   Continuous Blood Gluc Sensor (DEXCOM G7 SENSOR) MISC CHANGE SENSOR EVERY 10 DAYS   Continuous Blood Gluc Transmit (DEXCOM G6 TRANSMITTER) MISC Use 1 transmitter every 90 days.   diazepam (VALIUM) 10 MG tablet Take 0.5 tablets (5 mg total) by mouth at bedtime as needed for anxiety or sleep.   ezetimibe (ZETIA) 10 MG tablet Take 1 tablet (10 mg total) by mouth daily.   gabapentin  (NEURONTIN) 300 MG capsule Take 2 capsules (600 mg total) by mouth 3 (three) times daily.   glucose blood (FREESTYLE TEST STRIPS) test strip Use as instructed   hydrochlorothiazide (HYDRODIURIL) 25 MG tablet Take 1 tablet (25 mg total) by mouth in the morning.   HYDROcodone-acetaminophen (NORCO) 10-325 MG tablet Take 1 tablet by mouth every 6 (six) hours as needed for severe pain.   icosapent Ethyl (VASCEPA) 1 g capsule Take 2 capsules (2 g total) by mouth  2 (two) times daily.   insulin NPH-regular Human (NOVOLIN 70/30) (70-30) 100 UNIT/ML injection Inject 60 Units into the skin 2 (two) times daily before a meal. When glucose is above 90 and eating   lansoprazole (PREVACID) 30 MG capsule Take 1 capsule (30 mg total) by mouth daily at 12 noon.   levofloxacin (LEVAQUIN) 500 MG tablet Take 1 tablet (500 mg total) by mouth daily.   losartan (COZAAR) 50 MG tablet Take 1 tablet (50 mg total) by mouth daily. (Patient taking differently: Take 25 mg by mouth at bedtime.)   metFORMIN (GLUCOPHAGE-XR) 500 MG 24 hr tablet Take 1 tablet (500 mg total) by mouth daily with breakfast.   methocarbamol (ROBAXIN) 500 MG tablet TAKE 1 TABLET BY MOUTH EVERY 8 HOURS AS NEEDED FOR MUSCLE SPASMS.   metoprolol tartrate (LOPRESSOR) 25 MG tablet TAKE 1 TABLET BY MOUTH TWICE A DAY.   nitroGLYCERIN (NITROSTAT) 0.4 MG SL tablet Place 0.4 mg under the tongue every 5 (five) minutes as needed for chest pain. Reported on 02/26/2016   NON FORMULARY Take 20 mg by mouth daily. Domperidone (Motilium) 10 mg   NONFORMULARY OR COMPOUNDED ITEM Apply 1 application. topically daily as needed (neuropathy). Compounded cream for foot pain. Gets at [employer].   ondansetron (ZOFRAN) 4 MG tablet TAKE (1) TABLET BY MOUTH (3) TIMES DAILY AS NEEDED FOR NAUSEA.   PARoxetine (PAXIL) 10 MG tablet TAKE ONE TABLET BY MOUTH ONCE DAILY FOR DEPRESSION.   polyethylene glycol (MIRALAX / GLYCOLAX) 17 g packet Take 17 g by mouth daily as needed for moderate  constipation.   promethazine (PHENERGAN) 25 MG tablet Take 25 mg by mouth every 6 (six) hours as needed for nausea or vomiting. Take 1/4 to 1/2 tablet by mouth as needed   tadalafil (CIALIS) 5 MG tablet Take 1-4 tabs po prn.  Don't take within 4 hours of tamsulosin or with the NTG.   tamsulosin (FLOMAX) 0.4 MG CAPS capsule Take 0.4 mg by mouth daily.   [DISCONTINUED] fenofibrate micronized (LOFIBRA) 67 MG capsule Take 1 capsule (67 mg total) by mouth daily before breakfast. (Patient not taking: Reported on 01/19/2022)   [DISCONTINUED] metFORMIN (GLUCOPHAGE-XR) 500 MG 24 hr tablet Take 1 tablet (500 mg total) by mouth daily with breakfast.   [DISCONTINUED] rosuvastatin (CRESTOR) 5 MG tablet Take 1 tablet (5 mg total) by mouth daily.   No facility-administered encounter medications on file as of 07/22/2022.    ALLERGIES: Allergies  Allergen Reactions   Iodinated Contrast Media Hives, Itching and Nausea Only    Flushing, Burning, itching Other reaction(s): Angioedema (ALLERGY/intolerance)    Iohexol Hives and Shortness Of Breath     Desc: HIVES,SOB NEEDS PREP.MEDS    Metoclopramide Hcl Hives    Patient became very spastic   Alfuzosin Hcl Er     Dropped blood pressure   Sulfonamide Derivatives Hives    Large hives   Enalapril Cough   Penicillins Rash    Has patient had a PCN reaction causing immediate rash, facial/tongue/throat swelling, SOB or lightheadedness with hypotension: Yes Has patient had a PCN reaction causing severe rash involving mucus membranes or skin necrosis: No Has patient had a PCN reaction that required hospitalization No Has patient had a PCN reaction occurring within the last 10 years: No. If all of the above answers are "NO", then may proceed with Cephalosporin use.    Sulfa Antibiotics Rash    Other Reaction: Not Assessed    VACCINATION STATUS: Immunization History  Administered Date(s)  Administered   Influenza Split 07/23/2014    Diabetes He presents  for his follow-up diabetic visit. Diabetes type: Pancreatic diabetes. His disease course has been improving. There are no hypoglycemic associated symptoms. Pertinent negatives for hypoglycemia include no confusion, headaches, pallor or seizures. Pertinent negatives for diabetes include no chest pain, no fatigue, no polydipsia, no polyphagia, no polyuria and no weakness. There are no hypoglycemic complications. Symptoms are improving. Diabetic complications include autonomic neuropathy, heart disease, nephropathy and peripheral neuropathy. Risk factors for coronary artery disease include dyslipidemia, diabetes mellitus, family history, male sex, hypertension and sedentary lifestyle. Current diabetic treatment includes insulin injections (Is currently on Novolin 70/30 50 units nightly,  Metformin 500 mg p.o. twice daily). His weight is fluctuating minimally. He has not had a previous visit with a dietitian. He participates in exercise intermittently. His home blood glucose trend is decreasing steadily. His breakfast blood glucose range is generally 140-180 mg/dl. His lunch blood glucose range is generally 140-180 mg/dl. His dinner blood glucose range is generally 140-180 mg/dl. His bedtime blood glucose range is generally 140-180 mg/dl. His overall blood glucose range is 140-180 mg/dl. [name] presents with improved glycemic profile.  His Dexcom CGM shows 51% time in range, 48% level 1 hyperglycemia.  He has been using lowered dose of Novolin 70/30 on his own adjustment.  His point-of-care A1c is 7.4%, progressively improving from 9.4%.  He did not document any hypoglycemia.  ) An ACE inhibitor/angiotensin II receptor blocker is not being taken. Eye exam is current.  Hyperlipidemia This is a chronic problem. The current episode started more than 1 year ago. The problem is uncontrolled. Recent lipid tests were reviewed and are high. Pertinent negatives include no chest pain, myalgias or shortness of breath.  Current antihyperlipidemic treatment includes bile acid sequestrants (He has statin intolerance.). Risk factors for coronary artery disease include dyslipidemia, diabetes mellitus, family history, hypertension, male sex and a sedentary lifestyle.  Hypertension This is a chronic problem. The current episode started more than 1 year ago. The problem is controlled. Pertinent negatives include no chest pain, headaches, neck pain, palpitations or shortness of breath. Risk factors for coronary artery disease include dyslipidemia, diabetes mellitus, family history and male gender. Past treatments include diuretics and beta blockers. Hypertensive end-organ damage includes CAD/MI.     Objective:       07/22/2022    2:50 PM 04/14/2022    4:27 PM 04/14/2022    4:15 PM  Vitals with BMI  Height '5\' 9"'$     Weight 176 lbs 6 oz    BMI 69.62    Systolic 952 841 324  Diastolic 82 82 82  Pulse 68      BP 130/82   Pulse 68   Ht '5\' 9"'$  (1.753 m)   Wt 176 lb 6.4 oz (80 kg)   BMI 26.05 kg/m   Wt Readings from Last 3 Encounters:  07/22/22 176 lb 6.4 oz (80 kg)  04/14/22 180 lb (81.6 kg)  01/19/22 179 lb 12.8 oz (81.6 kg)      CMP ( most recent) CMP     Component Value Date/Time   NA 133 (L) 07/21/2022 1155   K 4.3 07/21/2022 1155   CL 93 (L) 07/21/2022 1155   CO2 21 07/21/2022 1155   GLUCOSE 217 (H) 07/21/2022 1155   GLUCOSE 224 (H) 11/06/2021 1400   BUN 39 (H) 07/21/2022 1155   CREATININE 1.55 (H) 07/21/2022 1155   CREATININE 1.27 07/08/2017 1343   CALCIUM 9.4 07/21/2022  1155   PROT 6.8 07/21/2022 1155   ALBUMIN 4.3 07/21/2022 1155   AST 22 07/21/2022 1155   ALT 15 07/21/2022 1155   ALKPHOS 74 07/21/2022 1155   BILITOT 0.6 07/21/2022 1155   GFRNONAA 45 (L) 11/06/2021 1400   GFRAA 62 06/27/2020 1500     Diabetic Labs (most recent): Lab Results  Component Value Date   HGBA1C 7.4 (A) 07/22/2022   HGBA1C 8.2 (H) 04/08/2022   HGBA1C 9.4 (H) 01/05/2022   MICROALBUR 150 09/23/2020      Lipid Panel ( most recent) Lipid Panel     Component Value Date/Time   CHOL 222 (H) 07/21/2022 1155   TRIG 852 (HH) 07/21/2022 1155   HDL 24 (L) 07/21/2022 1155   CHOLHDL 9.3 (H) 07/21/2022 1155   CHOLHDL NOT REPORTED DUE TO HIGH TRIGLYCERIDES 09/29/2021 1531   VLDL UNABLE TO CALCULATE IF TRIGLYCERIDE OVER 400 mg/dL 09/29/2021 1531   LDLCALC Comment (A) 07/21/2022 1155   LDLDIRECT 110 08/19/2016 1259   LABVLDL Comment (A) 07/21/2022 1155       Assessment & Plan:   1.  Uncontrolled diabetes due to pancreatic insufficiency , CKD.   - [name] has currently uncontrolled symptomatic diabetes as a result of pancreatic insufficiency 61 years of age. - His medical history is long and complicated.  See above.  [name] presents with improved glycemic profile.  His Dexcom CGM shows 51% time in range, 48% level 1 hyperglycemia.  He has been using lowered dose of Novolin 70/30 on his own adjustment.  His point-of-care A1c is 7.4%, progressively improving from 9.4%.  He did not document any hypoglycemia.    I had a long discussion with him about the pathology behind his diabetes and its complications. -his diabetes is complicated by coronary artery disease which required intervention, ED, peripheral neuropathy and he remains at a high risk for more acute and chronic complications which include CAD, CVA, CKD, retinopathy, and neuropathy. These are all discussed in detail with him.  - he acknowledges that there is a room for improvement in his food and drink choices. - Suggestion is made for him to avoid simple carbohydrates  from his diet including Cakes, Sweet Desserts, Ice Cream, Soda (diet and regular), Sweet Tea, Candies, Chips, Cookies, Store Bought Juices, Alcohol , Artificial Sweeteners,  Coffee Creamer, and "Sugar-free" Products, Lemonade. This will help patient to have more stable blood glucose profile and potentially avoid unintended weight gain.  The following Lifestyle  Medicine recommendations according to [place]  [facility]) were discussed and and offered to patient and he  agrees to start the journey:  A. Whole Foods, Plant-Based Nutrition comprising of fruits and vegetables, plant-based proteins, whole-grain carbohydrates was discussed in detail with the patient.   A list for source of those nutrients were also provided to the patient.  Patient will use only water or unsweetened tea for hydration. B.  The need to stay away from risky substances including alcohol, smoking; obtaining 7 to 9 hours of restorative sleep, at least 150 minutes of moderate intensity exercise weekly, the importance of healthy social connections,  and stress management techniques were discussed. C.  A full color page of  Calorie density of various food groups per pound showing examples of each food groups was provided to the patient.   - he he might have benefited from consulting with a dietitian, however he would like to postpone this for now.     - I have  approached him with the following individualized plan to manage  his diabetes and patient agrees:   -In light of his diabetes being due to his pancreatic insufficiency, he will continue to need multiple daily injections of insulin in order for him to achieve and maintain control of diabetes to target.  However, it should be noted that this patient is trying to achieve remission of  diabetes which may be challenging in his case.  -He has done reasonably well with premixed insulin twice a day.   -He has been adjusting his dose to himself .  He will benefit from staying on consistent dose of insulin, advised to continue Novolin 70/30 60 units twice daily-with breakfast and with supper for [place] blood glucose readings above 90 mg per DL associated with utilization of his CGM continuously.   He is advised to keep his metformin to the lowest dose of 500 mg ER p.o. daily at breakfast.  He does have CKD, hence not  a candidate for maximum dose of metformin.   - he is warned not to take insulin without proper monitoring per orders.  - he is encouraged to call clinic for blood glucose levels less than 70 or above 200 mg /dl. -He will be considered for insulin analogs utilizing Lantus and NovoLog if necessary on next visit. - he is not a suitable candidate for incretin therapy-DPP 4 regulators nor GLP-1 receptors agonists.  - Specific targets for  A1c;  LDL, HDL,  and Triglycerides were discussed with the patient.  2) Blood Pressure /Hypertension:  -His blood pressure is controlled to target.  He will not tolerate any additional medications with ACE inhibitors or ARB. he is advised to continue his current medications including hydrochlorothiazide, and metoprolol. His urine microalbumin to creatinine ratio is abnormal.  He will have a repeat urine microalbumin next visit.  3) Lipids/Hyperlipidemia:   Review of his recent lipid panel showed uncontrolled triglyceride levels creasing to 852 .  He has low HDL of 24, total cholesterol 222.  His LDL is not calculated, however expected to be high.  He does not tolerate statins.  He is advised to continue Zetia 10 mg p.o. daily and Vascepa 2 g p.o. twice daily.   He is hesitant, however will benefit from whole food plant-based diet discussed above.   Lipid panel needs continued follow-up, will have fasting lipid panel before his next visit.   4)  Weight/Diet:  Body mass index is 26.05 kg/m.  -     he is not a candidate for weight loss. I discussed with him the fact that loss of 5 - 10% of his  current body weight will have the most impact on his diabetes management.  Exercise, and detailed carbohydrates information provided  -  detailed on discharge instructions.  5) Chronic Care/Health Maintenance:  -he  Is not  on ACEI/ARB and Statin medications and  is encouraged to initiate and continue to follow up with Ophthalmology, Dentist,  Podiatrist at least yearly or  according to recommendations, and advised to   stay away from smoking. I have recommended yearly flu vaccine and pneumonia vaccine at least every 5 years; moderate intensity exercise for up to 150 minutes weekly; and  sleep for at least 7 hours a day.   - he is  advised to maintain close follow up with Lindell Spar, MD for primary care needs, as well as his other providers for optimal and coordinated care.    I spent 26 minutes in  the care of the patient today including review of labs from [place], Lipids, Thyroid Function, Hematology (current and previous including abstractions from other facilities); face-to-face time discussing  his blood glucose readings/logs, discussing hypoglycemia and hyperglycemia episodes and symptoms, medications doses, his options of short and long term treatment based on the latest standards of care / guidelines;  discussion about incorporating lifestyle medicine;  and documenting the encounter. Risk reduction counseling performed per USPSTF guidelines to reduce  cardiovascular risk factors.     Please refer to Patient Instructions for Blood Glucose Monitoring and Insulin/Medications Dosing Guide"  in media tab for additional information. Please  also refer to " Patient Self Inventory" in the Media  tab for reviewed elements of pertinent patient history.  [name] participated in the discussions, expressed understanding, and voiced agreement with the above plans.  All questions were answered to his satisfaction. he is encouraged to contact clinic should he have any questions or concerns prior to his return visit.    Follow up plan: - No follow-ups on file.  Glade Lloyd, MD [facility] [address] [place],  [address] Phone: [phone]  Fax: [phone]    07/22/2022, 6:49 PM  This note was partially dictated with voice recognition software. Similar sounding words can be transcribed  inadequately or may not  be corrected upon review.

## 2022-07-22 NOTE — Patient Instructions (Signed)

## 2022-08-04 ENCOUNTER — Other Ambulatory Visit: Payer: Self-pay | Admitting: Family Medicine

## 2022-08-04 DIAGNOSIS — F339 Major depressive disorder, recurrent, unspecified: Secondary | ICD-10-CM

## 2022-08-04 MED ORDER — PAROXETINE HCL 10 MG PO TABS: ORAL_TABLET | ORAL | 0 refills | Status: DC

## 2022-08-05 DIAGNOSIS — E1140 Type 2 diabetes mellitus with diabetic neuropathy, unspecified: Secondary | ICD-10-CM | POA: Diagnosis not present

## 2022-08-12 ENCOUNTER — Ambulatory Visit (INDEPENDENT_AMBULATORY_CARE_PROVIDER_SITE_OTHER): Payer: Medicare Other | Admitting: Internal Medicine

## 2022-08-12 ENCOUNTER — Encounter: Payer: Self-pay | Admitting: Internal Medicine

## 2022-08-12 ENCOUNTER — Other Ambulatory Visit: Payer: Self-pay | Admitting: Internal Medicine

## 2022-08-12 VITALS — BP 138/84 | HR 73 | Ht 69.0 in | Wt 177.2 lb

## 2022-08-12 DIAGNOSIS — E1142 Type 2 diabetes mellitus with diabetic polyneuropathy: Secondary | ICD-10-CM

## 2022-08-12 DIAGNOSIS — E1140 Type 2 diabetes mellitus with diabetic neuropathy, unspecified: Secondary | ICD-10-CM

## 2022-08-12 DIAGNOSIS — Z789 Other specified health status: Secondary | ICD-10-CM | POA: Diagnosis not present

## 2022-08-12 DIAGNOSIS — D45 Polycythemia vera: Secondary | ICD-10-CM | POA: Diagnosis not present

## 2022-08-12 DIAGNOSIS — K861 Other chronic pancreatitis: Secondary | ICD-10-CM

## 2022-08-12 DIAGNOSIS — I10 Essential (primary) hypertension: Secondary | ICD-10-CM

## 2022-08-12 DIAGNOSIS — E782 Mixed hyperlipidemia: Secondary | ICD-10-CM

## 2022-08-12 DIAGNOSIS — Z2821 Immunization not carried out because of patient refusal: Secondary | ICD-10-CM

## 2022-08-12 DIAGNOSIS — Z794 Long term (current) use of insulin: Secondary | ICD-10-CM

## 2022-08-12 MED ORDER — HYDROCODONE-ACETAMINOPHEN 10-325 MG PO TABS: 1.0000 | ORAL_TABLET | Freq: Four times a day (QID) | ORAL | 0 refills | Status: DC | PRN

## 2022-08-12 NOTE — Assessment & Plan Note (Signed)
Long-standing, severe b/l LE pain On Gabapentin 400 mg TID and Norco PRN Still uncontrolled, but better compared to prior, had increased dose of Gabapentin to 600 mg TID SSRI and Valium for chronic pain and anxiety related to neuropathy Has Gabapentin cream for neuropathy

## 2022-08-12 NOTE — Assessment & Plan Note (Signed)
Has tried Lipitor and Crestor, has severe myopathy On Zetia currently

## 2022-08-12 NOTE — Assessment & Plan Note (Addendum)
PatientJAK2 negative Used to get venipuncture every 3-4 months F/u Heme/Onc. Check CBC His Hb has been stable recently without venipuncture, denies any recent signs of bleeding

## 2022-08-12 NOTE — Assessment & Plan Note (Signed)
Related to previous episode of 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 On pancrelipase Follows up with GI

## 2022-08-12 NOTE — Assessment & Plan Note (Addendum)
BP Readings from Last 1 Encounters:  08/12/22 138/84   Better controlled with Metoprolol, Losartan and HCTZ Counseled for compliance with the medications Advised DASH diet and moderate exercise/walking, at least 150 mins/week

## 2022-08-12 NOTE — Patient Instructions (Signed)
Please continue to take medications as prescribed.  Please continue to follow low carb diet and perform moderate exercise/walking as tolerated.

## 2022-08-12 NOTE — Assessment & Plan Note (Addendum)
Lab Results  Component Value Date   HGBA1C 7.4 (A) 07/22/2022   Uncontrolled, but improving On Novolin 60 U BID and Metformin, f/u with Dr Dorris Fetch Advised to follow diabetic diet -has joined diabetes reversal group Not on any statin due to statin intolerance Diabetic eye exam: Advised to follow up with Ophthalmology for diabetic eye exam

## 2022-08-12 NOTE — Assessment & Plan Note (Signed)
Continue Zetia and Vascepa Intolerant to statin and Repatha  May try Praluent, discuss with Cardiology

## 2022-08-12 NOTE — Progress Notes (Signed)
Established Patient Office Visit  Subjective:  Patient ID: [name], male    DOB: 01-03-1961  Age: 61 y.o. MRN: [id number]  CC:  Chief Complaint  Patient presents with   Follow-up    For diabetes    HPI [name] is a 61 y.o. male with past medical history of CAD status post CABG, hypertension, uncontrolled diabetes mellitus-on insulin, severe diabetic neuropathy, chronic pancreatitis, polycythemia vera, hyperlipidemia, cervical spinal stenosis and chronic back pain who presents for f/u of her chronic medical conditions.  HTN: BP is better controlled now. Takes medications regularly. Patient denies headache, dizziness, chest pain, dyspnea or palpitations.   DM: HbA1C is 7.4 now. He had visit with Dr Dorris Fetch and his insulin dose was increased to 60 U BID. He also takes Metformin. Denies any polyuria or polyphagia currently.  He has CGM device, and his blood glucose runs around 150-200 most of the time.  He has joined a diabetes reversal group, who supplies  nutritional supplements and provide guidance on diet.  Diabetic neuropathy: Takes gabapentin and as needed Norco for severe pain.  He has tried gabapentin cream as well for neuropathy, but continues to have severe burning pain in his feet.  He is currently taking gabapentin 600 mg 3 times daily, which has improved his pain intensity now.  HLD: His recent lipid profile showed significantly elevated TG and LDL.  Of note, he was placed on Crestor and fenofibrate by his cardiologist, but he did not tolerate any of them.  He is currently taking Zetia and Vascepa, but compliance is questionable.   Polycythemia: His Hb has been stable ~14 lately.  He has not had to do phlebotomy in the last 6 months.  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  Patent coronary bypass grafts with a free radial graft to PDA and LIMA to the  diag.   Diabetes mellitus    Diabetic peripheral neuropathy ([place]) 05/06/2020   Dyslipidemia    Poorly controlled, probably related to his DM (Some of this is secondary to his inability to afford medications).   ED (erectile dysfunction)    Esophageal yeast infection ([place])    Hypertension    Metaplasia of esophagus    Pancreatitis chronic    Pseudocyst   Polycythemia vera(238.4) 04/26/2012   PONV (postoperative nausea and vomiting)     Past Surgical History:  Procedure Laterality Date   APPENDECTOMY     BIOPSY  11/11/2021   Procedure: BIOPSY;  Surgeon: Rogene Houston, MD;  [facility];  Service: Endoscopy;;   CARDIAC CATHETERIZATION     CHOLECYSTECTOMY     COLONOSCOPY  07/31/1985   COLONOSCOPY N/A 03/11/2016   Procedure: COLONOSCOPY;  Surgeon: Rogene Houston, MD;  [facility];  Service: Endoscopy;  Laterality: N/A;   COLONOSCOPY WITH PROPOFOL N/A 11/11/2021   Procedure: COLONOSCOPY WITH PROPOFOL;  Surgeon: Rogene Houston, MD;  [facility];  Service: Endoscopy;  Laterality: N/A;  205   CORONARY ANGIOPLASTY WITH STENT PLACEMENT  2006   CORONARY ARTERY BYPASS GRAFT  11/24/2004   ESOPHAGOGASTRODUODENOSCOPY  09/19/01   ESOPHAGOGASTRODUODENOSCOPY N/A 03/11/2016   Procedure: ESOPHAGOGASTRODUODENOSCOPY (EGD);  Surgeon: Mechele Dawley  Laural Golden, MD;  [facility];  Service: Endoscopy;  Laterality: N/A;  12:00   ESOPHAGOGASTRODUODENOSCOPY (EGD) WITH PROPOFOL N/A 11/11/2021   Procedure: ESOPHAGOGASTRODUODENOSCOPY (EGD) WITH PROPOFOL;  Surgeon: Rogene Houston, MD;  [facility];  Service: Endoscopy;  Laterality: N/A;   FINGER TENDON REPAIR     in middle finger   KNEE SURGERY     left knee   NASAL SEPTUM SURGERY     POLYPECTOMY  11/11/2021   Procedure: POLYPECTOMY;  Surgeon: Rogene Houston, MD;  [facility];  Service:  Endoscopy;;    Family History  Problem Relation Age of Onset   Coronary artery disease Mother        strong family history of   Heart attack Mother        dying of (at age 56)   Diabetes Mother    Diabetes Sister    Diabetes Brother    Pancreatitis Brother    Healthy Daughter    Hyperlipidemia Daughter    Heart disease Brother    Hypertension Brother    Hyperlipidemia Brother     Social History   Socioeconomic History   Marital status: Married    Spouse name: Not on file   Number of children: Not on file   Years of education: Not on file   Highest education level: Not on file  Occupational History   Not on file  Tobacco Use   Smoking status: Never   Smokeless tobacco: Never  Substance and Sexual Activity   Alcohol use: Not Currently    Comment: Patient may drink a beer or glass of wine a couple times a month   Drug use: No   Sexual activity: Not on file  Other Topics Concern   Not on file  Social History Narrative   The patient lives in [place] with his wife.  He is     disabled secondary to coronary artery disease and pancreatitis.  He does     not smoke, he does not drink alcohol.  There is no drug use.    Social Determinants of Health   Financial Resource Strain: Low Risk  (11/02/2021)   Overall Financial Resource Strain (CARDIA)    Difficulty of Paying Living Expenses: Not hard at all  Food Insecurity: No Food Insecurity (11/02/2021)   Hunger Vital Sign    Worried About Running Out of Food in the Last Year: Never true    Ran Out of Food in the Last Year: Never true  Transportation Needs: No Transportation Needs (11/02/2021)   PRAPARE - Hydrologist (Medical): No    Lack of Transportation (Non-Medical): No  Physical Activity: Inactive (11/02/2021)   Exercise Vital Sign    Days of Exercise per Week: 0 days    Minutes of Exercise per Session: 0 min  Stress: No Stress Concern Present (11/02/2021)   [place]    Feeling of Stress : Not at all  Social Connections: Moderately Integrated (11/02/2021)   Social Connection and Isolation Panel [NHANES]    Frequency of Communication with Friends and Family: Three times a week    Frequency of Social Gatherings with Friends and Family: Not on file    Attends Religious Services: More than 4 times per year    Active Member of [employer] or Organizations: No    Attends Archivist Meetings: Never    Marital Status: Married  Intimate  Partner Violence: Not on file    Outpatient Medications Prior to Visit  Medication Sig Dispense Refill   Bempedoic Acid 180 MG TABS Take 180 mg by mouth daily. (Patient not taking: Reported on 12/22/2021) 30 tablet 11   Cholecalciferol 125 MCG (5000 UT) TABS Take 10,000 Units by mouth daily.     clopidogrel (PLAVIX) 75 MG tablet Take 1 tablet (75 mg total) by mouth daily. 90 tablet 3   Continuous Blood Gluc Receiver (DEXCOM G7 RECEIVER) DEVI USE TO CHECK GLUCOSE AS NEEDED 1 each 0   Continuous Blood Gluc Sensor (DEXCOM G7 SENSOR) MISC CHANGE SENSOR EVERY 10 DAYS 3 each 2   Continuous Blood Gluc Transmit (DEXCOM G6 TRANSMITTER) MISC Use 1 transmitter every 90 days. 1 each 1   diazepam (VALIUM) 10 MG tablet Take 0.5 tablets (5 mg total) by mouth at bedtime as needed for anxiety or sleep. 30 tablet 0   ezetimibe (ZETIA) 10 MG tablet Take 1 tablet (10 mg total) by mouth daily. 90 tablet 0   gabapentin (NEURONTIN) 300 MG capsule TAKE (2) CAPSULES BY MOUTH (3) TIMES DAILY. 540 capsule 0   glucose blood (FREESTYLE TEST STRIPS) test strip Use as instructed 100 each 12   hydrochlorothiazide (HYDRODIURIL) 25 MG tablet Take 1 tablet (25 mg total) by mouth in the morning. 90 tablet 2   icosapent Ethyl (VASCEPA) 1 g capsule Take 2 capsules (2 g total) by mouth 2 (two) times daily. 360 capsule 3   insulin NPH-regular Human (NOVOLIN 70/30) (70-30) 100 UNIT/ML injection Inject 60 Units  into the skin 2 (two) times daily before a meal. When glucose is above 90 and eating     lansoprazole (PREVACID) 30 MG capsule Take 1 capsule (30 mg total) by mouth daily at 12 noon. 90 capsule 3   levofloxacin (LEVAQUIN) 500 MG tablet Take 1 tablet (500 mg total) by mouth daily. 7 tablet 0   losartan (COZAAR) 50 MG tablet Take 1 tablet (50 mg total) by mouth daily. (Patient taking differently: Take 25 mg by mouth at bedtime.) 90 tablet 1   metFORMIN (GLUCOPHAGE-XR) 500 MG 24 hr tablet Take 1 tablet (500 mg total) by mouth daily with breakfast. 90 tablet 1   methocarbamol (ROBAXIN) 500 MG tablet TAKE 1 TABLET BY MOUTH EVERY 8 HOURS AS NEEDED FOR MUSCLE SPASMS. 30 tablet 0   metoprolol tartrate (LOPRESSOR) 25 MG tablet TAKE 1 TABLET BY MOUTH TWICE A DAY. 60 tablet 5   nitroGLYCERIN (NITROSTAT) 0.4 MG SL tablet Place 0.4 mg under the tongue every 5 (five) minutes as needed for chest pain. Reported on 02/26/2016     NON FORMULARY Take 20 mg by mouth daily. Domperidone (Motilium) 10 mg     NONFORMULARY OR COMPOUNDED ITEM Apply 1 application. topically daily as needed (neuropathy). Compounded cream for foot pain. Gets at [employer].     ondansetron (ZOFRAN) 4 MG tablet TAKE (1) TABLET BY MOUTH (3) TIMES DAILY AS NEEDED FOR NAUSEA. 30 tablet 0   PARoxetine (PAXIL) 10 MG tablet TAKE ONE TABLET BY MOUTH ONCE DAILY FOR DEPRESSION. 90 tablet 0   polyethylene glycol (MIRALAX / GLYCOLAX) 17 g packet Take 17 g by mouth daily as needed for moderate constipation.     promethazine (PHENERGAN) 25 MG tablet Take 25 mg by mouth every 6 (six) hours as needed for nausea or vomiting. Take 1/4 to 1/2 tablet by mouth as needed     tadalafil (CIALIS) 5 MG tablet Take 1-4 tabs po prn.  Don't take within 4 hours of tamsulosin or with the NTG. 30 tablet 5   tamsulosin (FLOMAX) 0.4 MG CAPS capsule Take 0.4 mg by mouth daily.     HYDROcodone-acetaminophen (NORCO) 10-325 MG tablet Take 1 tablet by mouth every 6 (six) hours as  needed for severe pain. 120 tablet 0   No facility-administered medications prior to visit.    Allergies  Allergen Reactions   Iodinated Contrast Media Hives, Itching and Nausea Only    Flushing, Burning, itching Other reaction(s): Angioedema (ALLERGY/intolerance)    Iohexol Hives and Shortness Of Breath     Desc: HIVES,SOB NEEDS PREP.MEDS    Metoclopramide Hcl Hives    Patient became very spastic   Alfuzosin Hcl Er     Dropped blood pressure   Sulfonamide Derivatives Hives    Large hives   Enalapril Cough   Penicillins Rash    Has patient had a PCN reaction causing immediate rash, facial/tongue/throat swelling, SOB or lightheadedness with hypotension: Yes Has patient had a PCN reaction causing severe rash involving mucus membranes or skin necrosis: No Has patient had a PCN reaction that required hospitalization No Has patient had a PCN reaction occurring within the last 10 years: No. If all of the above answers are "NO", then may proceed with Cephalosporin use.    Sulfa Antibiotics Rash    Other Reaction: Not Assessed    ROS Review of Systems  Constitutional:  Negative for chills and fever.  HENT:  Negative for congestion and sore throat.   Eyes:  Negative for pain and discharge.  Respiratory:  Negative for cough and shortness of breath.   Cardiovascular:  Negative for palpitations.  Gastrointestinal:  Negative for constipation, diarrhea, nausea and vomiting.  Endocrine: Negative for polydipsia and polyuria.  Genitourinary:  Positive for difficulty urinating. Negative for hematuria.  Musculoskeletal:  Positive for arthralgias, back pain and neck pain. Negative for neck stiffness.  Skin:  Negative for rash.  Neurological:  Positive for numbness (B/l LE, intermittent). Negative for dizziness, weakness and headaches.  Psychiatric/Behavioral:  Negative for agitation and behavioral problems.       Objective:    Physical Exam Vitals reviewed.  Constitutional:       General: He is not in acute distress.    Appearance: He is not diaphoretic.  HENT:     Head: Normocephalic and atraumatic.     Nose: Nose normal.     Mouth/Throat:     Mouth: Mucous membranes are moist.  Eyes:     General: No scleral icterus.    Extraocular Movements: Extraocular movements intact.  Cardiovascular:     Rate and Rhythm: Normal rate and regular rhythm.     Pulses: Normal pulses.     Heart sounds: Normal heart sounds. No murmur heard. Pulmonary:     Breath sounds: Normal breath sounds. No wheezing or rales.  Musculoskeletal:     Cervical back: Neck supple. No tenderness.     Right lower leg: No edema.     Left lower leg: No edema.  Skin:    General: Skin is warm.     Findings: No rash.  Neurological:     General: No focal deficit present.     Mental Status: He is alert and oriented to person, place, and time.     Sensory: Sensory deficit (B/l feet) present.     Motor: No weakness.  Psychiatric:        Mood and Affect: Mood normal.  Behavior: Behavior normal.     BP 138/84 (BP Location: Right Arm, Cuff Size: Normal)   Pulse 73   Ht _0  (1.753 m)   Wt 177 lb 3.2 oz (80.4 kg)   SpO2 96%   BMI 26.17 kg/m  Wt Readings from Last 3 Encounters:  08/12/22 177 lb 3.2 oz (80.4 kg)  07/22/22 176 lb 6.4 oz (80 kg)  04/14/22 180 lb (81.6 kg)    Lab Results  Component Value Date   TSH 3.480 07/21/2022   Lab Results  Component Value Date   WBC 6.8 08/12/2022   HGB 14.9 08/12/2022   HCT 47.5 08/12/2022   MCV 76 (L) 08/12/2022   PLT 224 08/12/2022   Lab Results  Component Value Date   NA 133 (L) 07/21/2022   K 4.3 07/21/2022   CO2 21 07/21/2022   GLUCOSE 217 (H) 07/21/2022   BUN 39 (H) 07/21/2022   CREATININE 1.55 (H) 07/21/2022   BILITOT 0.6 07/21/2022   ALKPHOS 74 07/21/2022   AST 22 07/21/2022   ALT 15 07/21/2022   PROT 6.8 07/21/2022   ALBUMIN 4.3 07/21/2022   CALCIUM 9.4 07/21/2022   ANIONGAP 13 11/06/2021   EGFR 51 (L) 07/21/2022    Lab Results  Component Value Date   CHOL 222 (H) 07/21/2022   Lab Results  Component Value Date   HDL 24 (L) 07/21/2022   Lab Results  Component Value Date   LDLCALC Comment (A) 07/21/2022   Lab Results  Component Value Date   TRIG 852 (HH) 07/21/2022   Lab Results  Component Value Date   CHOLHDL 9.3 (H) 07/21/2022   Lab Results  Component Value Date   HGBA1C 7.4 (A) 07/22/2022      Assessment & Plan:   Problem List Items Addressed This Visit       Cardiovascular and Mediastinum   Essential hypertension, benign - Primary    BP Readings from Last 1 Encounters:  08/12/22 138/84  Better controlled with Metoprolol, Losartan and HCTZ Counseled for compliance with the medications Advised DASH diet and moderate exercise/walking, at least 150 mins/week        Digestive   Chronic pancreatitis (HCC)    Related to previous episode of 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 On pancrelipase Follows up with GI      Relevant Medications   HYDROcodone-acetaminophen (NORCO) 10-325 MG tablet     Endocrine   Diabetic peripheral neuropathy (HCC)    Long-standing, severe b/l LE pain On Gabapentin 400 mg TID and Norco PRN Still uncontrolled, but better compared to prior, had increased dose of Gabapentin to 600 mg TID SSRI and Valium for chronic pain and anxiety related to neuropathy Has Gabapentin cream for neuropathy      Relevant Medications   HYDROcodone-acetaminophen (NORCO) 10-325 MG tablet   Type 2 diabetes mellitus with diabetic neuropathy, with long-term current use of insulin (HCC)    Lab Results  Component Value Date   HGBA1C 7.4 (A) 07/22/2022  Uncontrolled, but improving On Novolin 60 U BID and Metformin, f/u with Dr Dorris Fetch Advised to follow diabetic diet -has joined diabetes reversal group Not on any statin due to statin intolerance Diabetic eye exam: Advised to follow up with Ophthalmology for diabetic eye exam         Other   Polycythemia vera ([place])    PatientJAK2 negative Used to get venipuncture every 3-4 months F/u Heme/Onc. Check CBC His Hb has been stable recently without venipuncture, denies  any recent signs of bleeding      Relevant Orders   CBC with Differential/Platelet (Completed)   Mixed hyperlipidemia    Continue Zetia and Vascepa Intolerant to statin and Repatha  May try Praluent, discuss with Cardiology      Statin intolerance    Has tried Lipitor and Crestor, has severe myopathy On Zetia currently       Meds ordered this encounter  Medications   HYDROcodone-acetaminophen (NORCO) 10-325 MG tablet    Sig: Take 1 tablet by mouth every 6 (six) hours as needed for severe pain.    Dispense:  120 tablet    Refill:  0    Follow-up: Return in about 4 months (around 12/24/2022) for Annual physical.    Lindell Spar, MD

## 2022-08-13 LAB — CBC WITH DIFFERENTIAL/PLATELET
Basophils Absolute: 0.1 10*3/uL (ref 0.0–0.2)
Basos: 1 %
EOS (ABSOLUTE): 0.3 10*3/uL (ref 0.0–0.4)
Eos: 4 %
Hematocrit: 47.5 % (ref 37.5–51.0)
Hemoglobin: 14.9 g/dL (ref 13.0–17.7)
Immature Grans (Abs): 0 10*3/uL (ref 0.0–0.1)
Immature Granulocytes: 0 %
Lymphocytes Absolute: 1.5 10*3/uL (ref 0.7–3.1)
Lymphs: 22 %
MCH: 23.7 pg — ABNORMAL LOW (ref 26.6–33.0)
MCHC: 31.4 g/dL — ABNORMAL LOW (ref 31.5–35.7)
MCV: 76 fL — ABNORMAL LOW (ref 79–97)
Monocytes Absolute: 0.7 10*3/uL (ref 0.1–0.9)
Monocytes: 10 %
Neutrophils Absolute: 4.3 10*3/uL (ref 1.4–7.0)
Neutrophils: 63 %
Platelets: 224 10*3/uL (ref 150–450)
RBC: 6.28 x10E6/uL — ABNORMAL HIGH (ref 4.14–5.80)
RDW: 18.7 % — ABNORMAL HIGH (ref 11.6–15.4)
WBC: 6.8 10*3/uL (ref 3.4–10.8)

## 2022-08-13 MED ORDER — HYDROCODONE-ACETAMINOPHEN 10-325 MG PO TABS: 1.0000 | ORAL_TABLET | Freq: Four times a day (QID) | ORAL | 0 refills | Status: DC | PRN

## 2022-09-01 DIAGNOSIS — D751 Secondary polycythemia: Secondary | ICD-10-CM | POA: Diagnosis not present

## 2022-09-01 DIAGNOSIS — D45 Polycythemia vera: Secondary | ICD-10-CM | POA: Diagnosis not present

## 2022-09-01 DIAGNOSIS — G629 Polyneuropathy, unspecified: Secondary | ICD-10-CM | POA: Diagnosis not present

## 2022-09-04 DIAGNOSIS — E1140 Type 2 diabetes mellitus with diabetic neuropathy, unspecified: Secondary | ICD-10-CM | POA: Diagnosis not present

## 2022-09-08 DIAGNOSIS — D136 Benign neoplasm of pancreas: Secondary | ICD-10-CM | POA: Diagnosis not present

## 2022-09-08 DIAGNOSIS — D45 Polycythemia vera: Secondary | ICD-10-CM | POA: Diagnosis not present

## 2022-09-30 DIAGNOSIS — D45 Polycythemia vera: Secondary | ICD-10-CM | POA: Diagnosis not present

## 2022-09-30 DIAGNOSIS — N400 Enlarged prostate without lower urinary tract symptoms: Secondary | ICD-10-CM | POA: Diagnosis not present

## 2022-09-30 DIAGNOSIS — D751 Secondary polycythemia: Secondary | ICD-10-CM | POA: Diagnosis not present

## 2022-09-30 DIAGNOSIS — D136 Benign neoplasm of pancreas: Secondary | ICD-10-CM | POA: Diagnosis not present

## 2022-09-30 DIAGNOSIS — K862 Cyst of pancreas: Secondary | ICD-10-CM | POA: Diagnosis not present

## 2022-09-30 DIAGNOSIS — R9341 Abnormal radiologic findings on diagnostic imaging of renal pelvis, ureter, or bladder: Secondary | ICD-10-CM | POA: Diagnosis not present

## 2022-10-04 DIAGNOSIS — I7381 Erythromelalgia: Secondary | ICD-10-CM | POA: Diagnosis not present

## 2022-10-04 DIAGNOSIS — D751 Secondary polycythemia: Secondary | ICD-10-CM | POA: Diagnosis not present

## 2022-10-04 DIAGNOSIS — G629 Polyneuropathy, unspecified: Secondary | ICD-10-CM | POA: Diagnosis not present

## 2022-10-04 DIAGNOSIS — I10 Essential (primary) hypertension: Secondary | ICD-10-CM | POA: Diagnosis not present

## 2022-10-04 DIAGNOSIS — Z8719 Personal history of other diseases of the digestive system: Secondary | ICD-10-CM | POA: Diagnosis not present

## 2022-10-04 DIAGNOSIS — K861 Other chronic pancreatitis: Secondary | ICD-10-CM | POA: Diagnosis not present

## 2022-10-06 ENCOUNTER — Ambulatory Visit: Payer: Medicare Other | Admitting: Urology

## 2022-10-06 VITALS — BP 169/95 | HR 67

## 2022-10-06 DIAGNOSIS — Z8719 Personal history of other diseases of the digestive system: Secondary | ICD-10-CM | POA: Diagnosis not present

## 2022-10-06 DIAGNOSIS — N138 Other obstructive and reflux uropathy: Secondary | ICD-10-CM

## 2022-10-06 DIAGNOSIS — G629 Polyneuropathy, unspecified: Secondary | ICD-10-CM | POA: Diagnosis not present

## 2022-10-06 DIAGNOSIS — I10 Essential (primary) hypertension: Secondary | ICD-10-CM | POA: Diagnosis not present

## 2022-10-06 DIAGNOSIS — N401 Enlarged prostate with lower urinary tract symptoms: Secondary | ICD-10-CM | POA: Diagnosis not present

## 2022-10-06 DIAGNOSIS — K861 Other chronic pancreatitis: Secondary | ICD-10-CM | POA: Diagnosis not present

## 2022-10-06 DIAGNOSIS — R39198 Other difficulties with micturition: Secondary | ICD-10-CM | POA: Diagnosis not present

## 2022-10-06 DIAGNOSIS — N3289 Other specified disorders of bladder: Secondary | ICD-10-CM | POA: Diagnosis not present

## 2022-10-06 DIAGNOSIS — I7381 Erythromelalgia: Secondary | ICD-10-CM | POA: Diagnosis not present

## 2022-10-06 DIAGNOSIS — D751 Secondary polycythemia: Secondary | ICD-10-CM | POA: Diagnosis not present

## 2022-10-06 LAB — URINALYSIS, ROUTINE W REFLEX MICROSCOPIC
Bilirubin, UA: NEGATIVE
Glucose, UA: NEGATIVE
Ketones, UA: NEGATIVE
Leukocytes,UA: NEGATIVE
Nitrite, UA: NEGATIVE
RBC, UA: NEGATIVE
Specific Gravity, UA: 1.015 (ref 1.005–1.030)
Urobilinogen, Ur: 0.2 mg/dL (ref 0.2–1.0)
pH, UA: 6 (ref 5.0–7.5)

## 2022-10-06 LAB — MICROSCOPIC EXAMINATION
Bacteria, UA: NONE SEEN
RBC, Urine: NONE SEEN /hpf (ref 0–2)

## 2022-10-06 MED ORDER — SILODOSIN 8 MG PO CAPS: 8.0000 mg | ORAL_CAPSULE | Freq: Every day | ORAL | 11 refills | Status: DC

## 2022-10-06 NOTE — Progress Notes (Signed)
10/06/2022 10:48 AM   [name] 02-May-1961 [id number]  Referring provider: Lindell Spar, MD [address] [place],  [place] [address]  Possible bladder mass   HPI: [name] is a 62yo here for evaluation of a bladder mass. He underwent MRI 2/8 of the pelvis which showed a possible bladder mass. IPSS 27 QOL 4 on flomax intermittently. Urine stream is weak. Nocturia 3-4x. He has straining to urinate.    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  Patent coronary bypass grafts with a free radial graft to PDA and LIMA to the  diag.   Diabetes mellitus    Diabetic peripheral neuropathy ([place]) 05/06/2020   Dyslipidemia    Poorly controlled, probably related to his DM (Some of this is secondary to his inability to afford medications).   ED (erectile dysfunction)    Esophageal yeast infection ([place])    Hypertension    Metaplasia of esophagus    Pancreatitis chronic    Pseudocyst   Polycythemia vera(238.4) 04/26/2012   PONV (postoperative nausea and vomiting)     Surgical History: Past Surgical History:  Procedure Laterality Date   APPENDECTOMY     BIOPSY  11/11/2021   Procedure: BIOPSY;  Surgeon: Rogene Houston, MD;  [facility];  Service: Endoscopy;;   CARDIAC CATHETERIZATION     CHOLECYSTECTOMY     COLONOSCOPY  07/31/1985   COLONOSCOPY N/A 03/11/2016   Procedure: COLONOSCOPY;  Surgeon: Rogene Houston, MD;  [facility];  Service: Endoscopy;  Laterality: N/A;   COLONOSCOPY WITH PROPOFOL N/A 11/11/2021   Procedure: COLONOSCOPY WITH PROPOFOL;  Surgeon: Rogene Houston, MD;  [facility];  Service: Endoscopy;  Laterality: N/A;  205   CORONARY ANGIOPLASTY WITH STENT PLACEMENT  2006   CORONARY ARTERY BYPASS GRAFT  11/24/2004    ESOPHAGOGASTRODUODENOSCOPY  09/19/01   ESOPHAGOGASTRODUODENOSCOPY N/A 03/11/2016   Procedure: ESOPHAGOGASTRODUODENOSCOPY (EGD);  Surgeon: Rogene Houston, MD;  [facility];  Service: Endoscopy;  Laterality: N/A;  12:00   ESOPHAGOGASTRODUODENOSCOPY (EGD) WITH PROPOFOL N/A 11/11/2021   Procedure: ESOPHAGOGASTRODUODENOSCOPY (EGD) WITH PROPOFOL;  Surgeon: Rogene Houston, MD;  [facility];  Service: Endoscopy;  Laterality: N/A;   FINGER TENDON REPAIR     in middle finger   KNEE SURGERY     left knee   NASAL SEPTUM SURGERY     POLYPECTOMY  11/11/2021   Procedure: POLYPECTOMY;  Surgeon: Rogene Houston, MD;  [facility];  Service: Endoscopy;;    Home Medications:  Allergies as of 10/06/2022       Reactions   Iodinated Contrast Media Hives, Itching, Nausea Only   Flushing, Burning, itching Other reaction(s): Angioedema (ALLERGY/intolerance)   Iohexol Hives, Shortness Of Breath    Desc: HIVES,SOB NEEDS PREP.MEDS   Metoclopramide Hcl Hives   Patient became very spastic   Alfuzosin Hcl Er    Dropped blood pressure   Sulfonamide Derivatives Hives   Large hives   Enalapril Cough   Penicillins Rash   Has patient had a PCN reaction causing immediate rash, facial/tongue/throat swelling, SOB or lightheadedness with hypotension: Yes Has patient had a PCN reaction causing severe rash  involving mucus membranes or skin necrosis: No Has patient had a PCN reaction that required hospitalization No Has patient had a PCN reaction occurring within the last 10 years: No. If all of the above answers are "NO", then may proceed with Cephalosporin use.   Sulfa Antibiotics Rash   Other Reaction: Not Assessed        Medication List        Accurate as of October 06, 2022 10:48 AM. If you have any questions, ask your nurse or doctor.          Bempedoic Acid 180 MG Tabs Take 180 mg by mouth daily.   Cholecalciferol 125 MCG (5000 UT) Tabs Take 10,000 Units by  mouth daily.   clopidogrel 75 MG tablet Commonly known as: PLAVIX Take 1 tablet (75 mg total) by mouth daily.   Dexcom G6 Transmitter Misc Use 1 transmitter every 90 days.   Dexcom G7 Receiver Devi USE TO CHECK GLUCOSE AS NEEDED   Dexcom G7 Sensor Misc CHANGE SENSOR EVERY 10 DAYS   diazepam 10 MG tablet Commonly known as: VALIUM Take 0.5 tablets (5 mg total) by mouth at bedtime as needed for anxiety or sleep.   ezetimibe 10 MG tablet Commonly known as: ZETIA Take 1 tablet (10 mg total) by mouth daily.   FREESTYLE TEST STRIPS test strip Generic drug: glucose blood Use as instructed   gabapentin 300 MG capsule Commonly known as: NEURONTIN TAKE (2) CAPSULES BY MOUTH (3) TIMES DAILY.   hydrochlorothiazide 25 MG tablet Commonly known as: HYDRODIURIL Take 1 tablet (25 mg total) by mouth in the morning.   HYDROcodone-acetaminophen 10-325 MG tablet Commonly known as: NORCO Take 1 tablet by mouth every 6 (six) hours as needed for severe pain.   icosapent Ethyl 1 g capsule Commonly known as: VASCEPA Take 2 capsules (2 g total) by mouth 2 (two) times daily.   insulin NPH-regular Human (70-30) 100 UNIT/ML injection Inject 60 Units into the skin 2 (two) times daily before a meal. When glucose is above 90 and eating   lansoprazole 30 MG capsule Commonly known as: PREVACID Take 1 capsule (30 mg total) by mouth daily at 12 noon.   levofloxacin 500 MG tablet Commonly known as: Levaquin Take 1 tablet (500 mg total) by mouth daily.   losartan 50 MG tablet Commonly known as: COZAAR Take 1 tablet (50 mg total) by mouth daily. What changed:  how much to take when to take this   metFORMIN 500 MG 24 hr tablet Commonly known as: GLUCOPHAGE-XR Take 1 tablet (500 mg total) by mouth daily with breakfast.   methocarbamol 500 MG tablet Commonly known as: ROBAXIN TAKE 1 TABLET BY MOUTH EVERY 8 HOURS AS NEEDED FOR MUSCLE SPASMS.   metoprolol tartrate 25 MG tablet Commonly known  as: LOPRESSOR TAKE 1 TABLET BY MOUTH TWICE A DAY.   nitroGLYCERIN 0.4 MG SL tablet Commonly known as: NITROSTAT Place 0.4 mg under the tongue every 5 (five) minutes as needed for chest pain. Reported on 02/26/2016   NON FORMULARY Take 20 mg by mouth daily. Domperidone (Motilium) 10 mg   NONFORMULARY OR COMPOUNDED ITEM Apply 1 application. topically daily as needed (neuropathy). Compounded cream for foot pain. Gets at [employer].   ondansetron 4 MG tablet Commonly known as: ZOFRAN TAKE (1) TABLET BY MOUTH (3) TIMES DAILY AS NEEDED FOR NAUSEA.   PARoxetine 10 MG tablet Commonly known as: PAXIL TAKE ONE TABLET BY MOUTH ONCE DAILY FOR DEPRESSION.   polyethylene glycol  17 g packet Commonly known as: MIRALAX / GLYCOLAX Take 17 g by mouth daily as needed for moderate constipation.   promethazine 25 MG tablet Commonly known as: PHENERGAN Take 25 mg by mouth every 6 (six) hours as needed for nausea or vomiting. Take 1/4 to 1/2 tablet by mouth as needed   tadalafil 5 MG tablet Commonly known as: CIALIS Take 1-4 tabs po prn.  Don't take within 4 hours of tamsulosin or with the NTG.   tamsulosin 0.4 MG Caps capsule Commonly known as: FLOMAX Take 0.4 mg by mouth daily.        Allergies:  Allergies  Allergen Reactions   Iodinated Contrast Media Hives, Itching and Nausea Only    Flushing, Burning, itching Other reaction(s): Angioedema (ALLERGY/intolerance)    Iohexol Hives and Shortness Of Breath     Desc: HIVES,SOB NEEDS PREP.MEDS    Metoclopramide Hcl Hives    Patient became very spastic   Alfuzosin Hcl Er     Dropped blood pressure   Sulfonamide Derivatives Hives    Large hives   Enalapril Cough   Penicillins Rash    Has patient had a PCN reaction causing immediate rash, facial/tongue/throat swelling, SOB or lightheadedness with hypotension: Yes Has patient had a PCN reaction causing severe rash involving mucus membranes or skin necrosis: No Has patient had a PCN  reaction that required hospitalization No Has patient had a PCN reaction occurring within the last 10 years: No. If all of the above answers are "NO", then may proceed with Cephalosporin use.    Sulfa Antibiotics Rash    Other Reaction: Not Assessed    Family History: Family History  Problem Relation Age of Onset   Coronary artery disease Mother        strong family history of   Heart attack Mother        dying of (at age 57)   Diabetes Mother    Diabetes Sister    Diabetes Brother    Pancreatitis Brother    Healthy Daughter    Hyperlipidemia Daughter    Heart disease Brother    Hypertension Brother    Hyperlipidemia Brother     Social History:  reports that he has never smoked. He has never used smokeless tobacco. He reports that he does not currently use alcohol. He reports that he does not use drugs.  ROS: All other review of systems were reviewed and are negative except what is noted above in HPI  Physical Exam: BP (!) 169/95   Pulse 67   Constitutional:  Alert and oriented, No acute distress. HEENT: [place] AT, moist mucus membranes.  Trachea midline, no masses. Cardiovascular: No clubbing, cyanosis, or edema. Respiratory: Normal respiratory effort, no increased work of breathing. GI: Abdomen is soft, nontender, nondistended, no abdominal masses GU: No CVA tenderness.  Lymph: No cervical or inguinal lymphadenopathy. Skin: No rashes, bruises or suspicious lesions. Neurologic: Grossly intact, no focal deficits, moving all 4 extremities. Psychiatric: Normal mood and affect.  Laboratory Data: Lab Results  Component Value Date   WBC 6.8 08/12/2022   HGB 14.9 08/12/2022   HCT 47.5 08/12/2022   MCV 76 (L) 08/12/2022   PLT 224 08/12/2022    Lab Results  Component Value Date   CREATININE 1.55 (H) 07/21/2022    No results found for: "PSA"  No results found for: "TESTOSTERONE"  Lab Results  Component Value Date   HGBA1C 7.4 (A) 07/22/2022    Urinalysis     Component Value  Date/Time   COLORURINE YELLOW 09/29/2021 1532   APPEARANCEUR Clear 01/07/2022 1131   LABSPEC 1.016 09/29/2021 1532   PHURINE 5.0 09/29/2021 1532   GLUCOSEU Negative 01/07/2022 1131   HGBUR NEGATIVE 09/29/2021 1532   BILIRUBINUR Negative 01/07/2022 1131   KETONESUR NEGATIVE 09/29/2021 1532   PROTEINUR Trace (A) 01/07/2022 1131   PROTEINUR NEGATIVE 09/29/2021 1532   UROBILINOGEN 0.2 03/28/2012 1042   NITRITE Negative 01/07/2022 1131   NITRITE NEGATIVE 09/29/2021 1532   LEUKOCYTESUR Negative 01/07/2022 1131   LEUKOCYTESUR NEGATIVE 09/29/2021 1532    Lab Results  Component Value Date   LABMICR Comment 01/07/2022   WBCUA None seen 05/14/2021   LABEPIT 0-10 05/14/2021   MUCUS Present 05/14/2021   BACTERIA RARE (A) 09/29/2021    Pertinent Imaging: MRI 09/30/2022: Images reviewed and discussed with the patient  Results for orders placed during the hospital encounter of 03/15/06  DG Abd 1 View  Narrative Clinical Data: Low back pain, question kidney stone. ABDOMEN - 1 VIEW: Findings: No unexpected radiopaque densities are seen over the renal shadows or expected course of the ureters or urinary bladder to suggest kidney stones. Bowel gas pattern is unremarkable. There is no focal bony abnormality.  Impression Negative for evidence of urinary tract calculi.   Provider: Lou Miner  No results found for this or any previous visit.  No results found for this or any previous visit.  No results found for this or any previous visit.  No results found for this or any previous visit.  No valid procedures specified. No results found for this or any previous visit.  Results for orders placed during the hospital encounter of 08/27/18  CT Renal Stone Study  Narrative CLINICAL DATA:  Initial evaluation for intermittent left-sided flank pain for 3 weeks.  EXAM: CT ABDOMEN AND PELVIS WITHOUT CONTRAST  TECHNIQUE: Multidetector CT imaging of the abdomen and  pelvis was performed following the standard protocol without IV contrast.  COMPARISON:  Prior CT from 01/08/2018.  FINDINGS: Lower chest: Visualized lung bases are clear. Small calcified lymph node noted associated with a fat containing hiatal hernia.  Hepatobiliary: Limited noncontrast evaluation of the liver is unremarkable. Gallbladder surgically absent. No biliary dilatation. Partially calcified porta hepatis node measuring 13 mm noted, similar to previous.  Pancreas: Irregular somewhat bilobed and partially calcified lesion at the pancreatic tail again seen, grossly similar to prior CT measuring up to approximately 2.8 cm, not well delineated on this noncontrast examination. Pancreatic duct mildly dilated up to 6 mm at the level of the mid pancreatic body. Remainder of the pancreas is normal in appearance.  Spleen: Spleen is normal.  Adrenals/Urinary Tract: Adrenal glands within normal limits. Kidneys equal in size without hydronephrosis. There are a few small punctate nonobstructive right renal nephrolithiasis involving the mid and upper pole. No radiopaque calculi seen along the course of either renal collecting system. No hydroureter. Partially distended bladder within normal limits. No layering stones within the bladder lumen.  Stomach/Bowel: Hiatal hernia containing mostly fat noted. Stomach within normal limits. No evidence for bowel obstruction. Colonic diverticulosis without overt evidence for acute diverticulitis. No acute inflammatory changes seen about the bowels.  Vascular/Lymphatic: Intra-abdominal aorta of normal caliber. Mild aorto bi-iliac atherosclerotic disease. No other adenopathy within the abdomen and pelvis.  Reproductive: Prostate mildly enlarged measuring 5.4 cm in diameter.  Other: Small bilateral fat containing inguinal hernias noted. No free air or fluid.  Musculoskeletal: No acute osseous abnormality. No discrete lytic or blastic osseous  lesions.  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  IMPRESSION: 1. Punctate nonobstructive right renal nephrolithiasis. No left-sided renal calculi or evidence for obstructive uropathy. 2. Colonic diverticulosis without overt evidence for acute diverticulitis. 3. Grossly similar appearance of irregular partially calcified lesion at the pancreatic tail. Finding is indeterminate, and could reflect changes related to previous/chronic pancreatitis or possibly a pancreatic neoplasm. Again, follow-up examination with nonemergent abdominal MRI recommended for complete evaluation. 4. 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   Electronically Signed By: Jeannine Boga M.D. On: 08/27/2018 04:58   Assessment & Plan:    1. Bladder mass -no bladder mass found on office cystoscopy. Irregularity on MRI was likely contrast mixing in bladder - Urinalysis, Routine w reflex microscopic  2. BPH with urinary obstruction -rapaflo 79m daily  3. Slowing, urinary stream -rapaflo 823mdaily   No follow-ups on file.  PaNicolette BangMD  [facility]    Cystoscopy Procedure Note  Patient identification was confirmed, informed consent was obtained, and patient was prepped using Betadine solution.  Lidocaine jelly was administered per urethral meatus.     Pre-Procedure: - Inspection reveals a normal caliber ureteral meatus.  Procedure: The flexible cystoscope was introduced without difficulty - No urethral strictures/lesions are present. - Enlarged prostate  - Normal bladder neck - Bilateral ureteral orifices identified - Bladder mucosa  reveals no ulcers, tumors, or lesions - No bladder stones - No trabeculation  Retroflexion shows no intravesical prostatic protrusion   Post-Procedure: - Patient tolerated the procedure well  Assessment/ Plan:   No follow-ups on  file.  PaNicolette BangMD

## 2022-10-08 ENCOUNTER — Encounter: Payer: Self-pay | Admitting: Urology

## 2022-10-08 NOTE — Patient Instructions (Signed)
Benign Prostatic Hyperplasia ? ?Benign prostatic hyperplasia (BPH) is an enlarged prostate gland that is caused by the normal aging process. The prostate may get bigger as a man gets older. The condition is not caused by cancer. The prostate is a walnut-sized gland that is involved in the production of semen. It is located in front of the rectum and below the bladder. The bladder stores urine. The urethra carries stored urine out of the body. ?An enlarged prostate can press on the urethra. This can make it harder to pass urine. The buildup of urine in the bladder can cause infection. Back pressure and infection may progress to bladder damage and kidney (renal) failure. ?What are the causes? ?This condition is part of the normal aging process. However, not all men develop problems from this condition. If the prostate enlarges away from the urethra, urine flow will not be blocked. If it enlarges toward the urethra and compresses it, there will be problems passing urine. ?What increases the risk? ?This condition is more likely to develop in men older than 50 years. ?What are the signs or symptoms? ?Symptoms of this condition include: ?Getting up often during the night to urinate. ?Needing to urinate frequently during the day. ?Difficulty starting urine flow. ?Decrease in size and strength of your urine stream. ?Leaking (dribbling) after urinating. ?Inability to pass urine. This needs immediate treatment. ?Inability to completely empty your bladder. ?Pain when you pass urine. This is more common if there is also an infection. ?Urinary tract infection (UTI). ?How is this diagnosed? ?This condition is diagnosed based on your medical history, a physical exam, and your symptoms. Tests will also be done, such as: ?A post-void bladder scan. This measures any amount of urine that may remain in your bladder after you finish urinating. ?A digital rectal exam. In a rectal exam, your health care provider checks your prostate by  putting a lubricated, gloved finger into your rectum to feel the back of your prostate gland. This exam detects the size of your gland and any abnormal lumps or growths. ?An exam of your urine (urinalysis). ?A prostate specific antigen (PSA) screening. This is a blood test used to screen for prostate cancer. ?An ultrasound. This test uses sound waves to electronically produce a picture of your prostate gland. ?Your health care provider may refer you to a specialist in kidney and prostate diseases (urologist). ?How is this treated? ?Once symptoms begin, your health care provider will monitor your condition (active surveillance or watchful waiting). Treatment for this condition will depend on the severity of your condition. Treatment may include: ?Observation and yearly exams. This may be the only treatment needed if your condition and symptoms are mild. ?Medicines to relieve your symptoms, including: ?Medicines to shrink the prostate. ?Medicines to relax the muscle of the prostate. ?Surgery in severe cases. Surgery may include: ?Prostatectomy. In this procedure, the prostate tissue is removed completely through an open incision or with a laparoscope or robotics. ?Transurethral resection of the prostate (TURP). In this procedure, a tool is inserted through the opening at the tip of the penis (urethra). It is used to cut away tissue of the inner core of the prostate. The pieces are removed through the same opening of the penis. This removes the blockage. ?Transurethral incision (TUIP). In this procedure, small cuts are made in the prostate. This lessens the prostate's pressure on the urethra. ?Transurethral microwave thermotherapy (TUMT). This procedure uses microwaves to create heat. The heat destroys and removes a small amount of   prostate tissue. ?Transurethral needle ablation (TUNA). This procedure uses radio frequencies to destroy and remove a small amount of prostate tissue. ?Interstitial laser coagulation (ILC).  This procedure uses a laser to destroy and remove a small amount of prostate tissue. ?Transurethral electrovaporization (TUVP). This procedure uses electrodes to destroy and remove a small amount of prostate tissue. ?Prostatic urethral lift. This procedure inserts an implant to push the lobes of the prostate away from the urethra. ?Follow these instructions at home: ?Take over-the-counter and prescription medicines only as told by your health care provider. ?Monitor your symptoms for any changes. Contact your health care provider with any changes. ?Avoid drinking large amounts of liquid before going to bed or out in public. ?Avoid or reduce how much caffeine or alcohol you drink. ?Give yourself time when you urinate. ?Keep all follow-up visits. This is important. ?Contact a health care provider if: ?You have unexplained back pain. ?Your symptoms do not get better with treatment. ?You develop side effects from the medicine you are taking. ?Your urine becomes very dark or has a bad smell. ?Your lower abdomen becomes distended and you have trouble passing urine. ?Get help right away if: ?You have a fever or chills. ?You suddenly cannot urinate. ?You feel light-headed or very dizzy, or you faint. ?There are large amounts of blood or clots in your urine. ?Your urinary problems become hard to manage. ?You develop moderate to severe low back or flank pain. The flank is the side of your body between the ribs and the hip. ?These symptoms may be an emergency. Get help right away. Call 911. ?Do not wait to see if the symptoms will go away. ?Do not drive yourself to the hospital. ?Summary ?Benign prostatic hyperplasia (BPH) is an enlarged prostate that is caused by the normal aging process. It is not caused by cancer. ?An enlarged prostate can press on the urethra. This can make it hard to pass urine. ?This condition is more likely to develop in men older than 50 years. ?Get help right away if you suddenly cannot urinate. ?This  information is not intended to replace advice given to you by your health care provider. Make sure you discuss any questions you have with your health care provider. ?Document Revised: 02/25/2021 Document Reviewed: 02/25/2021 ?Elsevier Patient Education ? 2023 [employer] ? ?

## 2022-10-09 ENCOUNTER — Encounter: Payer: Self-pay | Admitting: Urology

## 2022-10-09 ENCOUNTER — Other Ambulatory Visit: Payer: Self-pay | Admitting: Internal Medicine

## 2022-10-09 ENCOUNTER — Encounter: Payer: Self-pay | Admitting: Internal Medicine

## 2022-10-09 DIAGNOSIS — E1142 Type 2 diabetes mellitus with diabetic polyneuropathy: Secondary | ICD-10-CM

## 2022-10-09 DIAGNOSIS — E1140 Type 2 diabetes mellitus with diabetic neuropathy, unspecified: Secondary | ICD-10-CM | POA: Diagnosis not present

## 2022-10-11 ENCOUNTER — Other Ambulatory Visit: Payer: Self-pay

## 2022-10-11 DIAGNOSIS — E782 Mixed hyperlipidemia: Secondary | ICD-10-CM

## 2022-10-11 MED ORDER — EZETIMIBE 10 MG PO TABS: 10.0000 mg | ORAL_TABLET | Freq: Every day | ORAL | 0 refills | Status: DC

## 2022-10-11 MED ORDER — HYDROCODONE-ACETAMINOPHEN 10-325 MG PO TABS: 1.0000 | ORAL_TABLET | Freq: Four times a day (QID) | ORAL | 0 refills | Status: DC | PRN

## 2022-10-12 ENCOUNTER — Other Ambulatory Visit: Payer: Self-pay

## 2022-10-12 DIAGNOSIS — N411 Chronic prostatitis: Secondary | ICD-10-CM

## 2022-10-12 MED ORDER — CIPROFLOXACIN HCL 500 MG PO TABS: 500.0000 mg | ORAL_TABLET | Freq: Two times a day (BID) | ORAL | 0 refills | Status: DC

## 2022-10-12 NOTE — Telephone Encounter (Signed)
Medication sent in. Patient made aware.

## 2022-10-19 ENCOUNTER — Telehealth: Payer: Self-pay

## 2022-10-19 NOTE — Telephone Encounter (Signed)
Pharmacy Patient Advocate Encounter  Prior Authorization for VASCEPA 1G CAP has been approved.    Effective dates: 10/12/22 through 10/13/23   Received notification from [place] that prior authorization for VASCEPA 1G CAP is needed.    PA submitted on 10/12/22 [id number] Status is pending  Karie Soda, [facility] Patient Advocate Specialist Direct Number: [phone] Fax: [phone]

## 2022-10-20 ENCOUNTER — Encounter: Payer: Self-pay | Admitting: Cardiology

## 2022-10-21 ENCOUNTER — Other Ambulatory Visit: Payer: Self-pay | Admitting: Cardiology

## 2022-10-21 ENCOUNTER — Other Ambulatory Visit (HOSPITAL_COMMUNITY): Payer: Self-pay

## 2022-10-21 DIAGNOSIS — E785 Hyperlipidemia, unspecified: Secondary | ICD-10-CM

## 2022-10-21 MED ORDER — ICOSAPENT ETHYL 1 G PO CAPS: 2.0000 g | ORAL_CAPSULE | Freq: Two times a day (BID) | ORAL | 0 refills | Status: DC

## 2022-10-21 NOTE — Telephone Encounter (Signed)
Refills has been sent to the pharmacy. 

## 2022-10-22 ENCOUNTER — Other Ambulatory Visit: Payer: Self-pay | Admitting: *Deleted

## 2022-10-25 ENCOUNTER — Encounter (INDEPENDENT_AMBULATORY_CARE_PROVIDER_SITE_OTHER): Payer: Self-pay | Admitting: Gastroenterology

## 2022-10-25 ENCOUNTER — Ambulatory Visit (INDEPENDENT_AMBULATORY_CARE_PROVIDER_SITE_OTHER): Payer: Medicare Other | Admitting: Gastroenterology

## 2022-10-25 ENCOUNTER — Telehealth (INDEPENDENT_AMBULATORY_CARE_PROVIDER_SITE_OTHER): Payer: Self-pay | Admitting: Gastroenterology

## 2022-10-25 ENCOUNTER — Other Ambulatory Visit: Payer: Self-pay

## 2022-10-25 VITALS — BP 139/85 | HR 60 | Temp 97.1°F | Ht 69.5 in | Wt 175.5 lb

## 2022-10-25 DIAGNOSIS — K8681 Exocrine pancreatic insufficiency: Secondary | ICD-10-CM

## 2022-10-25 DIAGNOSIS — K22719 Barrett's esophagus with dysplasia, unspecified: Secondary | ICD-10-CM

## 2022-10-25 DIAGNOSIS — E785 Hyperlipidemia, unspecified: Secondary | ICD-10-CM

## 2022-10-25 DIAGNOSIS — D136 Benign neoplasm of pancreas: Secondary | ICD-10-CM | POA: Insufficient documentation

## 2022-10-25 DIAGNOSIS — K3184 Gastroparesis: Secondary | ICD-10-CM | POA: Diagnosis not present

## 2022-10-25 DIAGNOSIS — K861 Other chronic pancreatitis: Secondary | ICD-10-CM

## 2022-10-25 DIAGNOSIS — K219 Gastro-esophageal reflux disease without esophagitis: Secondary | ICD-10-CM

## 2022-10-25 MED ORDER — ICOSAPENT ETHYL 1 G PO CAPS: 2.0000 g | ORAL_CAPSULE | Freq: Two times a day (BID) | ORAL | 0 refills | Status: DC

## 2022-10-25 NOTE — Patient Instructions (Signed)
Cotninue Prevacid 30 mg qday Schedule EGD Restart domperidone daily if recurrent nausea/vomiting or feeling full with meals Repeat MRI abdomen w/ and w/o contrast in one year Continue management of triglycerides under cardiology Increase physical activity as much as possible

## 2022-10-25 NOTE — Telephone Encounter (Signed)
    10/25/22  Maurice March February 16, 1961  What type of surgery is being performed? EGD  When is surgery scheduled? TBD  Medication Clearance to hold Plavix x 5 days before.   Name of physician performing surgery?  Dr. Maylon Peppers [facility] at [facility] Phone: [phone] Fax: [phone]  Anethesia type (none, local, MAC, general)? MAC

## 2022-10-25 NOTE — Telephone Encounter (Signed)
Patient overdue for follow up visit. [facility] and pt did pick up refill of Vascepa on 2/29/20224 360 capsules. Last seen in [place] Dr Percival Spanish 11/18/2021 so overdue for follow up in 1 month.

## 2022-10-25 NOTE — Progress Notes (Unsigned)
[name], M.D. Gastroenterology & Hepatology [facility] [address] [place], [place] [address]  Primary Care Physician: Lindell Spar, MD [address] [place] [address]  I will communicate my assessment and recommendations to the referring MD via EMR.  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 male with PMH GERD c/b SSBE, gastroparesis, history of pancreatitis due to hypertriglyceridemia, polycythemia, CAD s/p stent placement, who presents for follow up of GERD, Barrett's esophagus, gastroparesis, serous cystadenoma and chronic pancreatitis.  The patient was last seen on 10/13/2021. At that time, the patient was advised to continue pancreatic enzyme supplementation for chronic pancreatitis, continue PRN Prevacid and to continue fenofibrate prescribed by Dr. [name].  Patient reports that he takes Prevacid 30 mg every day, usually when taking breakfast but sometimes takes it 30-60 minutes before breakfast. Waits 4 hours to take Plavix. He denies having any heartburn or odynophagia/dysphagia when taking the medication compliantly.  Patient was taking domperidone once a day for gastroparesis but stopped it a couple weeks ago as he was having too many bowel movements - 4 to 5 a day. States he recently noticed constipation, but today he had a normal bowel movements. He reports a couple of days ago he had some nausea and vomited, thinks it was related to some urinary complaints. Has had some pain in his testicles after recent cystoscopy, currently following with urology.  Patient has been followed by cardiology for hypertriglyceridemia, currently on Vascepa - started 3/4 years ago. Most recent labs from 10/04/22 - CBC WBC 6.6,  Hb 16.0 PLT 245. Labs 09/01/22 - ferritin 12.1, Cr 1.86 BUN 31 Cl 97 AST 20 ALT 6 alk phos 78 albumin 3.6 TB 0.8, glucose 239. Last TGC on 07/21/2022 was 852   The patient denies having any fever, chills, hematochezia, melena, hematemesis, abdominal distention, abdominal pain, diarrhea, jaundice, pruritus or weight loss.  Last MRI abdomen w/ and w/o contrast at [facility] on 10/04/2018 -  Clustered small cystic lesions with some calcification in the tail the pancreas, growing over the last 7 years and currently measuring 3.0 by 1.8 by 2.3 cm. I am uncertain whether there is any communication with the main pancreatic duct, although I doubt it. 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 Pancreatic pseudocyst is a less likely differential diagnostic consideration. Intraductal papillary mucinous neoplasm is not suspected given the calcifications. Given the very slow growth, consider surveillance by pancreatic protocol MRI in 2 years time.  Repeat MRI abdomen w/ and w/o contrast at [facility] on 09/30/2022 -  Similar size of the complex cystic lesion in the pancreatic tail  now measuring 2.8 cm, previously 3.0 cm. No new suspicious enhancing  soft tissue nodularity or mural/septal thickening identified  although evaluation of the lesion is again limited by respiratory  motion. Findings are most compatible with a serous cystadenoma of  the pancreas. However, given the poor evaluation of this lesion  secondary to respiratory motion would suggest follow-up MRCP with  and without contrast in 1 year.   Last EGD: 10/2021 Changes suggestive of Barrett's esophagus - unclear if short or long segment as it states it was short segment but report says 7 cm (possible typo?) - pictures appear to be short segment possibly 7 mm?. 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  Patent coronary bypass grafts with a free radial graft to PDA and LIMA to the  diag.   Diabetes mellitus    Diabetic peripheral neuropathy ([place]) 05/06/2020   Dyslipidemia    Poorly controlled, probably related to his DM (Some of this is secondary to his inability to afford medications).   ED (erectile dysfunction)    Esophageal yeast infection ([place])    Hypertension    Metaplasia of esophagus    Pancreatitis chronic    Pseudocyst   Polycythemia vera(238.4) 04/26/2012   PONV (postoperative nausea and vomiting)     Past Surgical History: Past Surgical History:  Procedure Laterality Date   APPENDECTOMY     BIOPSY  11/11/2021   Procedure: BIOPSY;  Surgeon: Rogene Houston, MD;  [facility];  Service: Endoscopy;;   CARDIAC CATHETERIZATION     CHOLECYSTECTOMY     COLONOSCOPY  07/31/1985   COLONOSCOPY N/A 03/11/2016   Procedure: COLONOSCOPY;  Surgeon: Rogene Houston, MD;  [facility];  Service: Endoscopy;  Laterality: N/A;   COLONOSCOPY WITH PROPOFOL N/A 11/11/2021   Procedure: COLONOSCOPY WITH PROPOFOL;  Surgeon: Rogene Houston, MD;  [facility];  Service: Endoscopy;  Laterality: N/A;  205   CORONARY ANGIOPLASTY WITH STENT PLACEMENT  2006   CORONARY ARTERY BYPASS GRAFT  11/24/2004   ESOPHAGOGASTRODUODENOSCOPY  09/19/01   ESOPHAGOGASTRODUODENOSCOPY N/A 03/11/2016   Procedure: ESOPHAGOGASTRODUODENOSCOPY (EGD);  Surgeon: Rogene Houston, MD;  [facility];  Service: Endoscopy;  Laterality: N/A;  12:00   ESOPHAGOGASTRODUODENOSCOPY (EGD) WITH PROPOFOL N/A 11/11/2021   Procedure:  ESOPHAGOGASTRODUODENOSCOPY (EGD) WITH PROPOFOL;  Surgeon: Rogene Houston, MD;  [facility];  Service: Endoscopy;  Laterality: N/A;   FINGER TENDON REPAIR     in middle finger   KNEE SURGERY     left knee   NASAL SEPTUM SURGERY     POLYPECTOMY  11/11/2021   Procedure: POLYPECTOMY;  Surgeon: Rogene Houston, MD;  [facility];  Service: Endoscopy;;    Family History: Family History  Problem Relation Age of Onset   Coronary artery disease Mother        strong family history of   Heart attack Mother        dying of (at age 22)   Diabetes Mother    Diabetes Sister    Diabetes Brother    Pancreatitis Brother    Healthy Daughter    Hyperlipidemia Daughter    Heart disease Brother    Hypertension Brother    Hyperlipidemia Brother     Social History: Social History   Tobacco Use  Smoking Status Never  Smokeless Tobacco Never   Social History   Substance and Sexual Activity  Alcohol Use Not Currently   Comment: Patient may drink a beer or glass of wine a couple times a month   Social History   Substance and Sexual Activity  Drug  Use No    Allergies: Allergies  Allergen Reactions   Iodinated Contrast Media Hives, Itching and Nausea Only    Flushing, Burning, itching Other reaction(s): Angioedema (ALLERGY/intolerance)    Iohexol Hives and Shortness Of Breath     Desc: HIVES,SOB NEEDS PREP.MEDS    Metoclopramide Hcl Hives    Patient became very spastic   Alfuzosin Hcl Er     Dropped blood pressure   Sulfonamide Derivatives Hives    Large hives   Enalapril Cough   Penicillins Rash    Has patient had a PCN reaction causing immediate rash, facial/tongue/throat swelling, SOB or lightheadedness with hypotension: Yes Has patient had a PCN reaction causing severe rash involving mucus membranes or skin necrosis: No Has patient had a PCN reaction that required hospitalization No Has patient had a PCN reaction occurring within the last 10 years:  No. If all of the above answers are "NO", then may proceed with Cephalosporin use.    Sulfa Antibiotics Rash    Other Reaction: Not Assessed    Medications: Current Outpatient Medications  Medication Sig Dispense Refill   Cholecalciferol 125 MCG (5000 UT) TABS Take 10,000 Units by mouth daily.     ciprofloxacin (CIPRO) 500 MG tablet Take 1 tablet (500 mg total) by mouth every 12 (twelve) hours. 14 tablet 0   clopidogrel (PLAVIX) 75 MG tablet Take 1 tablet (75 mg total) by mouth daily. 90 tablet 3   Continuous Blood Gluc Receiver (DEXCOM G7 RECEIVER) DEVI USE TO CHECK GLUCOSE AS NEEDED 1 each 0   Continuous Blood Gluc Sensor (DEXCOM G7 SENSOR) MISC CHANGE SENSOR EVERY 10 DAYS 3 each 2   Continuous Blood Gluc Transmit (DEXCOM G6 TRANSMITTER) MISC Use 1 transmitter every 90 days. 1 each 1   diazepam (VALIUM) 10 MG tablet Take 0.5 tablets (5 mg total) by mouth at bedtime as needed for anxiety or sleep. 30 tablet 0   ezetimibe (ZETIA) 10 MG tablet Take 1 tablet (10 mg total) by mouth daily. 90 tablet 0   gabapentin (NEURONTIN) 300 MG capsule TAKE (2) CAPSULES BY MOUTH (3) TIMES DAILY. 540 capsule 0   glucose blood (FREESTYLE TEST STRIPS) test strip Use as instructed 100 each 12   hydrochlorothiazide (HYDRODIURIL) 25 MG tablet Take 1 tablet (25 mg total) by mouth in the morning. 90 tablet 2   HYDROcodone-acetaminophen (NORCO) 10-325 MG tablet Take 1 tablet by mouth every 6 (six) hours as needed for severe pain. 120 tablet 0   insulin NPH-regular Human (NOVOLIN 70/30) (70-30) 100 UNIT/ML injection Inject 60 Units into the skin 2 (two) times daily before a meal. When glucose is above 90 and eating     lansoprazole (PREVACID) 30 MG capsule Take 1 capsule (30 mg total) by mouth daily at 12 noon. 90 capsule 3   losartan (COZAAR) 50 MG tablet Take 1 tablet (50 mg total) by mouth daily. (Patient taking differently: Take 25 mg by mouth at bedtime.) 90 tablet 1   metFORMIN (GLUCOPHAGE-XR) 500 MG 24 hr  tablet Take 1 tablet (500 mg total) by mouth daily with breakfast. 90 tablet 1   methocarbamol (ROBAXIN) 500 MG tablet TAKE 1 TABLET BY MOUTH EVERY 8 HOURS AS NEEDED FOR MUSCLE SPASMS. 30 tablet 0   metoprolol tartrate (LOPRESSOR) 25 MG tablet TAKE 1 TABLET BY MOUTH TWICE A DAY. 60 tablet 5   nitroGLYCERIN (NITROSTAT) 0.4 MG SL tablet Place 0.4 mg under the tongue every 5 (five) minutes as needed for chest pain. Reported on  02/26/2016     NONFORMULARY OR COMPOUNDED ITEM Apply 1 application. topically daily as needed (neuropathy). Compounded cream for foot pain. Gets at [employer].     ondansetron (ZOFRAN) 4 MG tablet TAKE (1) TABLET BY MOUTH (3) TIMES DAILY AS NEEDED FOR NAUSEA. 30 tablet 0   PARoxetine (PAXIL) 10 MG tablet TAKE ONE TABLET BY MOUTH ONCE DAILY FOR DEPRESSION. 90 tablet 0   polyethylene glycol (MIRALAX / GLYCOLAX) 17 g packet Take 17 g by mouth daily as needed for moderate constipation.     promethazine (PHENERGAN) 25 MG tablet Take 25 mg by mouth every 6 (six) hours as needed for nausea or vomiting. Take 1/4 to 1/2 tablet by mouth as needed     silodosin (RAPAFLO) 8 MG CAPS capsule Take 1 capsule (8 mg total) by mouth at bedtime. 30 capsule 11   tadalafil (CIALIS) 5 MG tablet Take 1-4 tabs po prn.  Don't take within 4 hours of tamsulosin or with the NTG. 30 tablet 5   Bempedoic Acid 180 MG TABS Take 180 mg by mouth daily. (Patient not taking: Reported on 12/22/2021) 30 tablet 11   icosapent Ethyl (VASCEPA) 1 g capsule Take 2 capsules (2 g total) by mouth 2 (two) times daily. Call and schedule follow up office visit to receive further refills. Dr Percival Spanish [phone] 60 capsule 0   NON FORMULARY Take 20 mg by mouth daily. Domperidone (Motilium) 10 mg (Patient not taking: Reported on 10/25/2022)     No current facility-administered medications for this visit.    Review of Systems: GENERAL: negative for malaise, night sweats HEENT: No changes in hearing or vision, no nose bleeds or  other nasal problems. NECK: Negative for lumps, goiter, pain and significant neck swelling RESPIRATORY: Negative for cough, wheezing CARDIOVASCULAR: Negative for chest pain, leg swelling, palpitations, orthopnea GI: SEE HPI MUSCULOSKELETAL: Negative for joint pain or swelling, back pain, and muscle pain. SKIN: Negative for lesions, rash PSYCH: Negative for sleep disturbance, mood disorder and recent psychosocial stressors. HEMATOLOGY Negative for prolonged bleeding, bruising easily, and swollen nodes. ENDOCRINE: Negative for cold or heat intolerance, polyuria, polydipsia and goiter. NEURO: negative for tremor, gait imbalance, syncope and seizures. The remainder of the review of systems is noncontributory.   Physical Exam: BP 139/85 (BP Location: Left Arm, Patient Position: Sitting, Cuff Size: Small)   Pulse 60   Temp (!) 97.1 F (36.2 C) (Temporal)   Ht 5' 9.5" (1.765 m)   Wt 175 lb 8 oz (79.6 kg)   BMI 25.55 kg/m  GENERAL: The patient is AO x3, in no acute distress. HEENT: Head is normocephalic and atraumatic. EOMI are intact. Mouth is well hydrated and without lesions. NECK: Supple. No masses LUNGS: Clear to auscultation. No presence of rhonchi/wheezing/rales. Adequate chest expansion HEART: RRR, normal s1 and s2. ABDOMEN: Soft, nontender, no guarding, no peritoneal signs, and nondistended. BS +. No masses. EXTREMITIES: Without any cyanosis, clubbing, rash, lesions or edema. NEUROLOGIC: AOx3, no focal motor deficit. SKIN: no jaundice, no rashes  Imaging/Labs: as above  I personally reviewed and interpreted the available labs, imaging and endoscopic files.  Impression and Plan: [name] is a 62 y.o. male with PMH GERD c/b SSBE, gastroparesis, history of pancreatitis due to hypertriglyceridemia, polycythemia, CAD s/p stent placement, who presents for follow up of GERD, Barrett's esophagus, gastroparesis, right side IPMN's and chronic pancreatitis.  The patient has  multiple medical issues from the gastroenterological standpoint.    Regarding his GERD, this seems to be  well-controlled with his current dose of Prevacid as he is currently symptom-free.  However, during his most recent EGD he was found to have indefinite dysplasia.  This should be further evaluated with a repeat EGD with repeat biopsies as his acid suppression seems to be better.  We will schedule an EGD in the near timeframe.  Will continue him on daily Prevacid at the same dose.  Regarding his gastroparesis, it seems that he has been able to have adequate oral intake.  I suspect his gastroparesis has been secondary to his neuropathy and opiate use.  He has stopped using domperidone as he was having diarrhea and has not had any recurrence of symptoms.  I advised him to restart taking domperidone daily if he presents with recurrent nausea/vomiting or early satiety.  In terms of his pancreatic abnormalities, the patient has a history of chronic pancreatitis due to hypertriglyceridemia.  He is currently undergoing management by cardiology for this metabolic condition, which should be strictly controlled to avoid any recurrent episodes of pancreatitis.  I encouraged to work out more frequently, but this may be limited to neuropathy.  His most recent MRCP showed stable cystic abnormalities in his pancreas without any changes in size.  Given the stability of this lesion and the likelihood this corresponds to a benign etiology such as serous cystadenoma, will repeat an MRI of the abdomen and pelvis with and without IV contrast in 1 year.  -Continue Prevacid 30 mg qday -Schedule EGD with esophageal biopsies -Restart domperidone daily if recurrent nausea/vomiting or feeling full with meals -Repeat MRI abdomen w/ and w/o contrast in one year -Continue management of triglycerides under cardiology -Increase physical activity as much as possible  All questions were answered.      [name] Peppers,  MD Gastroenterology and Hepatology [facility]

## 2022-10-26 ENCOUNTER — Other Ambulatory Visit: Payer: Self-pay

## 2022-10-26 MED ORDER — LEVOFLOXACIN 500 MG PO TABS: 500.0000 mg | ORAL_TABLET | Freq: Every day | ORAL | 0 refills | Status: DC

## 2022-10-26 NOTE — Addendum Note (Signed)
Addended by: Vicente Males on: 10/26/2022 03:26 PM   Modules accepted: Orders

## 2022-10-26 NOTE — Addendum Note (Signed)
Addended by: Vicente Males on: 10/26/2022 04:42 PM   Modules accepted: Orders

## 2022-10-26 NOTE — Telephone Encounter (Signed)
Pt contacted to schedule EGD Room 1. Pt states he will need to get with wife for availability. Pt states he will call back.

## 2022-10-26 NOTE — Telephone Encounter (Signed)
FYI- Pt returned call and scheduled EGD for 11/30/21. Instructed to hold plavix x 5 days, 1/2 dose of Metformin and Novolin night before, none day of. Lab order placed. Instructions sent to pt via my chart.

## 2022-10-26 NOTE — Telephone Encounter (Signed)
Thank you :)

## 2022-11-03 ENCOUNTER — Encounter: Payer: Self-pay | Admitting: Internal Medicine

## 2022-11-03 ENCOUNTER — Other Ambulatory Visit: Payer: Self-pay

## 2022-11-03 DIAGNOSIS — F339 Major depressive disorder, recurrent, unspecified: Secondary | ICD-10-CM

## 2022-11-03 MED ORDER — PAROXETINE HCL 10 MG PO TABS: ORAL_TABLET | ORAL | 0 refills | Status: DC

## 2022-11-04 ENCOUNTER — Encounter: Payer: Self-pay | Admitting: Internal Medicine

## 2022-11-04 ENCOUNTER — Telehealth: Payer: Medicare Other

## 2022-11-04 ENCOUNTER — Encounter: Payer: Medicare Other | Admitting: Family Medicine

## 2022-11-08 DIAGNOSIS — E1140 Type 2 diabetes mellitus with diabetic neuropathy, unspecified: Secondary | ICD-10-CM | POA: Diagnosis not present

## 2022-11-14 ENCOUNTER — Encounter: Payer: Self-pay | Admitting: Cardiology

## 2022-11-16 ENCOUNTER — Encounter: Payer: Self-pay | Admitting: Internal Medicine

## 2022-11-16 ENCOUNTER — Ambulatory Visit (INDEPENDENT_AMBULATORY_CARE_PROVIDER_SITE_OTHER): Payer: Medicare Other | Admitting: Internal Medicine

## 2022-11-16 DIAGNOSIS — Z0000 Encounter for general adult medical examination without abnormal findings: Secondary | ICD-10-CM | POA: Diagnosis not present

## 2022-11-16 NOTE — Progress Notes (Signed)
Subjective:  This is a telephone encounter between [name] and [name] on 11/16/2022 for awv. The visit was conducted with the patient located at home and [name] at [facility]. The patient's identity was confirmed using their DOB and current address. The patient has consented to being evaluated through a telephone encounter and understands the associated risks (an examination cannot be done and the patient may need to come in for an appointment) / benefits (allows the patient to remain at home, decreasing exposure to coronavirus).      [name] is a 62 y.o. male who presents for Medicare Annual/Subsequent preventive examination.  Review of Systems    Review of Systems  All other systems reviewed and are negative.   Objective:    There were no vitals filed for this visit. There is no height or weight on file to calculate BMI.     11/16/2022    2:13 PM 11/11/2021   12:46 PM 11/06/2021    2:23 PM 11/02/2021    1:46 PM 08/27/2018    3:13 AM 03/11/2016   11:16 AM  Advanced Directives  Does Patient Have a Medical Advance Directive? No No No No No No  Would patient like information on creating a medical advance directive? Yes (ED - Information included in AVS) No - Patient declined No - Patient declined Yes (ED - Information included in AVS)  Yes - Educational materials given    Current Medications (verified) Outpatient Encounter Medications as of 11/16/2022  Medication Sig   Bempedoic Acid 180 MG TABS Take 180 mg by mouth daily. (Patient not taking: Reported on 12/22/2021)   Cholecalciferol 125 MCG (5000 UT) TABS Take 10,000 Units by mouth daily.   ciprofloxacin (CIPRO) 500 MG tablet Take 1 tablet (500 mg total) by mouth every 12 (twelve) hours.   clopidogrel (PLAVIX) 75 MG tablet Take 1 tablet (75 mg total) by mouth daily.   Continuous Blood Gluc Receiver (DEXCOM G7 RECEIVER) DEVI USE TO CHECK GLUCOSE AS NEEDED   Continuous Blood Gluc Sensor (DEXCOM G7 SENSOR) MISC  CHANGE SENSOR EVERY 10 DAYS   Continuous Blood Gluc Transmit (DEXCOM G6 TRANSMITTER) MISC Use 1 transmitter every 90 days.   diazepam (VALIUM) 10 MG tablet Take 0.5 tablets (5 mg total) by mouth at bedtime as needed for anxiety or sleep.   ezetimibe (ZETIA) 10 MG tablet Take 1 tablet (10 mg total) by mouth daily.   gabapentin (NEURONTIN) 300 MG capsule TAKE (2) CAPSULES BY MOUTH (3) TIMES DAILY.   glucose blood (FREESTYLE TEST STRIPS) test strip Use as instructed   hydrochlorothiazide (HYDRODIURIL) 25 MG tablet Take 1 tablet (25 mg total) by mouth in the morning.   HYDROcodone-acetaminophen (NORCO) 10-325 MG tablet Take 1 tablet by mouth every 6 (six) hours as needed for severe pain.   icosapent Ethyl (VASCEPA) 1 g capsule Take 2 capsules (2 g total) by mouth 2 (two) times daily. Call and schedule follow up office visit to receive further refills. Dr Percival Spanish [phone]   insulin NPH-regular Human (NOVOLIN 70/30) (70-30) 100 UNIT/ML injection Inject 60 Units into the skin 2 (two) times daily before a meal. When glucose is above 90 and eating   lansoprazole (PREVACID) 30 MG capsule Take 1 capsule (30 mg total) by mouth daily at 12 noon.   levofloxacin (LEVAQUIN) 500 MG tablet Take 1 tablet (500 mg total) by mouth daily.   losartan (COZAAR) 50 MG tablet Take 1 tablet (50 mg total) by mouth daily. (Patient  taking differently: Take 25 mg by mouth at bedtime.)   metFORMIN (GLUCOPHAGE-XR) 500 MG 24 hr tablet Take 1 tablet (500 mg total) by mouth daily with breakfast.   methocarbamol (ROBAXIN) 500 MG tablet TAKE 1 TABLET BY MOUTH EVERY 8 HOURS AS NEEDED FOR MUSCLE SPASMS.   metoprolol tartrate (LOPRESSOR) 25 MG tablet TAKE 1 TABLET BY MOUTH TWICE A DAY.   nitroGLYCERIN (NITROSTAT) 0.4 MG SL tablet Place 0.4 mg under the tongue every 5 (five) minutes as needed for chest pain. Reported on 02/26/2016   NON FORMULARY Take 20 mg by mouth daily. Domperidone (Motilium) 10 mg (Patient not taking: Reported on  10/25/2022)   NONFORMULARY OR COMPOUNDED ITEM Apply 1 application. topically daily as needed (neuropathy). Compounded cream for foot pain. Gets at [employer].   ondansetron (ZOFRAN) 4 MG tablet TAKE (1) TABLET BY MOUTH (3) TIMES DAILY AS NEEDED FOR NAUSEA.   PARoxetine (PAXIL) 10 MG tablet TAKE ONE TABLET BY MOUTH ONCE DAILY FOR DEPRESSION.   polyethylene glycol (MIRALAX / GLYCOLAX) 17 g packet Take 17 g by mouth daily as needed for moderate constipation.   promethazine (PHENERGAN) 25 MG tablet Take 25 mg by mouth every 6 (six) hours as needed for nausea or vomiting. Take 1/4 to 1/2 tablet by mouth as needed   silodosin (RAPAFLO) 8 MG CAPS capsule Take 1 capsule (8 mg total) by mouth at bedtime.   tadalafil (CIALIS) 5 MG tablet Take 1-4 tabs po prn.  Don't take within 4 hours of tamsulosin or with the NTG.   No facility-administered encounter medications on file as of 11/16/2022.    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  Patent coronary bypass grafts with a free radial graft to PDA and LIMA to the  diag.   Diabetes mellitus    Diabetic peripheral neuropathy ([employer]) 05/06/2020   Dyslipidemia    Poorly controlled, probably related to his DM (Some of this is secondary to his inability to afford medications).   ED (erectile dysfunction)    Esophageal yeast infection ([place])    Hypertension    Metaplasia of esophagus    Pancreatitis chronic    Pseudocyst   Polycythemia vera(238.4) 04/26/2012   PONV (postoperative nausea and vomiting)    Past Surgical History:  Procedure Laterality Date   APPENDECTOMY      BIOPSY  11/11/2021   Procedure: BIOPSY;  Surgeon: Rogene Houston, MD;  [facility];  Service: Endoscopy;;   CARDIAC CATHETERIZATION     CHOLECYSTECTOMY     COLONOSCOPY  07/31/1985   COLONOSCOPY N/A 03/11/2016   Procedure: COLONOSCOPY;  Surgeon: Rogene Houston, MD;  [facility];  Service: Endoscopy;  Laterality: N/A;   COLONOSCOPY WITH PROPOFOL N/A 11/11/2021   Procedure: COLONOSCOPY WITH PROPOFOL;  Surgeon: Rogene Houston, MD;  [facility];  Service: Endoscopy;  Laterality: N/A;  205   CORONARY ANGIOPLASTY WITH STENT PLACEMENT  2006   CORONARY ARTERY BYPASS GRAFT  11/24/2004   ESOPHAGOGASTRODUODENOSCOPY  09/19/01   ESOPHAGOGASTRODUODENOSCOPY N/A 03/11/2016   Procedure: ESOPHAGOGASTRODUODENOSCOPY (EGD);  Surgeon: Bernadene Person  Gloriann Loan, MD;  [facility];  Service: Endoscopy;  Laterality: N/A;  12:00   ESOPHAGOGASTRODUODENOSCOPY (EGD) WITH PROPOFOL N/A 11/11/2021   Procedure: ESOPHAGOGASTRODUODENOSCOPY (EGD) WITH PROPOFOL;  Surgeon: Rogene Houston, MD;  [facility];  Service: Endoscopy;  Laterality: N/A;   FINGER TENDON REPAIR     in middle finger   KNEE SURGERY     left knee   NASAL SEPTUM SURGERY     POLYPECTOMY  11/11/2021   Procedure: POLYPECTOMY;  Surgeon: Rogene Houston, MD;  [facility];  Service: Endoscopy;;   Family History  Problem Relation Age of Onset   Coronary artery disease Mother        strong family history of   Heart attack Mother        dying of (at age 69)   Diabetes Mother    Diabetes Sister    Diabetes Brother    Pancreatitis Brother    Healthy Daughter    Hyperlipidemia Daughter    Heart disease Brother    Hypertension Brother    Hyperlipidemia Brother    Social History   Socioeconomic History   Marital status: Married    Spouse name: Not on file   Number of children: Not on file   Years of education: Not on file   Highest education level: Not on file  Occupational History   Not  on file  Tobacco Use   Smoking status: Never   Smokeless tobacco: Never  Vaping Use   Vaping Use: Never used  Substance and Sexual Activity   Alcohol use: Not Currently    Comment: Patient may drink a beer or glass of wine a couple times a month   Drug use: No   Sexual activity: Not on file  Other Topics Concern   Not on file  Social History Narrative   The patient lives in [place] with his wife.  He is     disabled secondary to coronary artery disease and pancreatitis.  He does     not smoke, he does not drink alcohol.  There is no drug use.    Social Determinants of Health   Financial Resource Strain: Low Risk  (11/02/2021)   Overall Financial Resource Strain (CARDIA)    Difficulty of Paying Living Expenses: Not hard at all  Food Insecurity: No Food Insecurity (11/02/2021)   Hunger Vital Sign    Worried About Running Out of Food in the Last Year: Never true    Ran Out of Food in the Last Year: Never true  Transportation Needs: No Transportation Needs (11/02/2021)   PRAPARE - Hydrologist (Medical): No    Lack of Transportation (Non-Medical): No  Physical Activity: Inactive (11/02/2021)   Exercise Vital Sign    Days of Exercise per Week: 0 days    Minutes of Exercise per Session: 0 min  Stress: No Stress Concern Present (11/02/2021)   [place]    Feeling of Stress : Not at all  Social Connections: Moderately Integrated (11/02/2021)   Social Connection and Isolation Panel [NHANES]    Frequency of Communication with Friends and Family: Three times a week    Frequency of Social Gatherings with Friends and Family: Not on file    Attends Religious Services: More than 4 times per year    Active Member of [employer] or Organizations: No    Attends Archivist Meetings: Never  Marital Status: Married    Tobacco Counseling Counseling given: Not Answered   Clinical  Intake:  Pre-visit preparation completed: Yes  Pain : 0-10 (chronic neuropathic pain) Pain Type: Chronic pain Pain Location: Foot Pain Orientation: Distal Pain Descriptors / Indicators: Burning Pain Onset: More than a month ago Pain Frequency: Several days a week     Diabetes: Yes CBG done?: No Did pt. bring in CBG monitor from home?: No  How often do you need to have someone help you when you read instructions, pamphlets, or other written materials from your doctor or pharmacy?: 1 - Never What is the last grade level you completed in school?: Advanced education  Interpreter Needed?: No   Activities of Daily Living    11/16/2022    2:15 PM  In your present state of health, do you have any difficulty performing the following activities:  Hearing? 0  Vision? 0  Difficulty concentrating or making decisions? 0  Walking or climbing stairs? 0  Dressing or bathing? 0  Doing errands, shopping? 0    Patient Care Team: Lindell Spar, MD as PCP - General (Internal Medicine) Minus Breeding, MD as PCP - Cardiology (Cardiology) Case, Reche Dixon, MD as Attending Physician (Orthopedic Surgery)  Indicate any recent Medical Services you may have received from other than [facility] providers in the past year (date may be approximate).     Assessment:   This is a routine wellness examination for [place].  Hearing/Vision screen No results found.  Dietary issues and exercise activities discussed:     Goals Addressed   None    Depression Screen    11/16/2022    2:15 PM 04/14/2022    3:51 PM 12/22/2021    1:43 PM 11/03/2021   11:28 AM 11/02/2021    1:48 PM 11/02/2021    1:35 PM 07/22/2021    3:38 PM  PHQ 2/9 Scores  PHQ - 2 Score 0 0 0 0 0 0 0  PHQ- 9 Score   0        Fall Risk    11/16/2022    2:15 PM 08/12/2022    3:40 PM 04/14/2022    3:50 PM 12/22/2021    1:43 PM 11/03/2021   11:27 AM  [place] in the past year? 0 0 0 0 0  Number falls in past yr:  0 0 0 0   Injury with Fall?  0 0 0 0  Risk for fall due to :   No Fall Risks No Fall Risks No Fall Risks  Follow up   Falls evaluation completed Falls evaluation completed Falls evaluation completed    [place]:  Any stairs in or around the home? Yes  If so, are there any without handrails? Yes  Home free of loose throw rugs in walkways, pet beds, electrical cords, etc? Yes  Adequate lighting in your home to reduce risk of falls? Yes   ASSISTIVE DEVICES UTILIZED TO PREVENT FALLS:  Life alert? No  Use of a cane, walker or w/c? No  Grab bars in the bathroom? Yes  Shower chair or bench in shower? No  Elevated toilet seat or a handicapped toilet? No     Cognitive Function:        11/16/2022    2:16 PM 11/02/2021    1:54 PM 10/09/2020    1:58 PM  6CIT Screen  What Year? 0 points 0 points 0 points  What month? 0  points 0 points 0 points  What time? 0 points 0 points 0 points  Count back from 20 0 points 0 points 0 points  Months in reverse 0 points 0 points 0 points  Repeat phrase 0 points 0 points 0 points  Total Score 0 points 0 points 0 points    Immunizations Immunization History  Administered Date(s) Administered   Influenza Split 07/23/2014    TDAP status: Due, Education has been provided regarding the importance of this vaccine. Advised may receive this vaccine at local pharmacy or Health Dept. Aware to provide a copy of the vaccination record if obtained from local pharmacy or Health Dept. Verbalized acceptance and understanding.  Flu Vaccine status: Declined, Education has been provided regarding the importance of this vaccine but patient still declined. Advised may receive this vaccine at local pharmacy or Health Dept. Aware to provide a copy of the vaccination record if obtained from local pharmacy or Health Dept. Verbalized acceptance and understanding.  Pneumococcal vaccine status: Declined,  Education has been provided regarding the  importance of this vaccine but patient still declined. Advised may receive this vaccine at local pharmacy or Health Dept. Aware to provide a copy of the vaccination record if obtained from local pharmacy or Health Dept. Verbalized acceptance and understanding.   Covid-19 vaccine status: Declined, Education has been provided regarding the importance of this vaccine but patient still declined. Advised may receive this vaccine at local pharmacy or Health Dept.or vaccine clinic. Aware to provide a copy of the vaccination record if obtained from local pharmacy or Health Dept. Verbalized acceptance and understanding.  Qualifies for Shingles Vaccine? Yes   Zostavax completed No   Shingrix Completed?: No.    Education has been provided regarding the importance of this vaccine. Patient has been advised to call insurance company to determine out of pocket expense if they have not yet received this vaccine. Advised may also receive vaccine at local pharmacy or Health Dept. Verbalized acceptance and understanding.  Screening Tests Health Maintenance  Topic Date Due   COVID-19 Vaccine (1) Never done   DTaP/Tdap/Td (1 - Tdap) Never done   Zoster Vaccines- Shingrix (1 of 2) Never done   Medicare Annual Wellness (AWV)  11/03/2022   OPHTHALMOLOGY EXAM  11/13/2022   INFLUENZA VACCINE  11/21/2022 (Originally 03/23/2022)   FOOT EXAM  12/23/2022   Diabetic kidney evaluation - Urine ACR  01/06/2023   HEMOGLOBIN A1C  01/20/2023   Diabetic kidney evaluation - eGFR measurement  07/22/2023   COLONOSCOPY (Pts 45-37yrs Insurance coverage will need to be confirmed)  11/12/2026   Hepatitis C Screening  Completed   HIV Screening  Completed   HPV VACCINES  Aged Out    Health Maintenance  Health Maintenance Due  Topic Date Due   COVID-19 Vaccine (1) Never done   DTaP/Tdap/Td (1 - Tdap) Never done   Zoster Vaccines- Shingrix (1 of 2) Never done   Medicare Annual Wellness (AWV)  11/03/2022   OPHTHALMOLOGY EXAM   11/13/2022    Colorectal cancer screening: Type of screening: Colonoscopy. Completed 11/11/2021. Repeat every 5 years  Lung Cancer Screening: (Low Dose CT Chest recommended if Age 24-80 years, 30 pack-year currently smoking OR have quit w/in 15years.) does not qualify.   Additional Screening:  Hepatitis C Screening: does not qualify; Completed 01/14/2021  Vision Screening: Recommended annual ophthalmology exams for early detection of glaucoma and other disorders of the eye. Is the patient up to date with their annual eye exam?  Yes  Who is the provider or what is the name of the office in which the patient attends annual eye exams? Madelin Headings, [facility] If pt is not established with a provider, would they like to be referred to a provider to establish care? No .   Dental Screening: Recommended annual dental exams for proper oral hygiene  Community Resource Referral / Chronic Care Management: CRR required this visit?  No   CCM required this visit?  No      Plan:     I have personally reviewed and noted the following in the patient's chart:   Medical and social history Use of alcohol, tobacco or illicit drugs  Current medications and supplements including opioid prescriptions. Patient is not currently taking opioid prescriptions. Functional ability and status Nutritional status Physical activity Advanced directives List of other physicians Hospitalizations, surgeries, and ER visits in previous 12 months Vitals Screenings to include cognitive, depression, and falls Referrals and appointments  In addition, I have reviewed and discussed with patient certain preventive protocols, quality metrics, and best practice recommendations. A written personalized care plan for preventive services as well as general preventive health recommendations were provided to patient.     [name] Dy, MD   11/16/2022

## 2022-11-16 NOTE — Patient Instructions (Signed)
  [name] , Thank you for taking time to come for your Medicare Wellness Visit. I appreciate your ongoing commitment to your health goals. Please review the following plan we discussed and let me know if I can assist you in the future.   These are the goals we discussed: Trying to keep recommended physical activity goal.  This is a list of the screening recommended for you and due dates:  Health Maintenance  Topic Date Due   COVID-19 Vaccine (1) Never done   DTaP/Tdap/Td vaccine (1 - Tdap) Never done   Zoster (Shingles) Vaccine (1 of 2) Never done   Medicare Annual Wellness Visit  11/03/2022   Eye exam for diabetics  11/13/2022   Flu Shot  11/21/2022*   Complete foot exam   12/23/2022   Yearly kidney health urinalysis for diabetes  01/06/2023   Hemoglobin A1C  01/20/2023   Yearly kidney function blood test for diabetes  07/22/2023   Colon Cancer Screening  11/12/2026   Hepatitis C Screening: USPSTF Recommendation to screen - Ages 18-79 yo.  Completed   HIV Screening  Completed   HPV Vaccine  Aged Out  *Topic was postponed. The date shown is not the original due date.

## 2022-11-17 ENCOUNTER — Encounter: Payer: Self-pay | Admitting: Urology

## 2022-11-17 ENCOUNTER — Ambulatory Visit: Payer: Medicare Other | Admitting: Urology

## 2022-11-29 ENCOUNTER — Encounter (INDEPENDENT_AMBULATORY_CARE_PROVIDER_SITE_OTHER): Payer: Self-pay | Admitting: Gastroenterology

## 2022-11-30 NOTE — Telephone Encounter (Signed)
Pt called into office to see who takes care of Dr.Castaneda messages. Look up pt chart and saw that message had been directed to me. Advised pt I would get him cancelled. Pt states he is having some aggravation and not up to completing EGD at this time. Pt states he will call back to schedule.

## 2022-12-01 ENCOUNTER — Encounter (HOSPITAL_COMMUNITY): Admission: RE | Payer: Self-pay | Source: Home / Self Care

## 2022-12-01 ENCOUNTER — Ambulatory Visit (HOSPITAL_COMMUNITY): Admission: RE | Admit: 2022-12-01 | Payer: Medicare Other | Source: Home / Self Care | Admitting: Gastroenterology

## 2022-12-01 SURGERY — ESOPHAGOGASTRODUODENOSCOPY (EGD) WITH PROPOFOL
Anesthesia: Monitor Anesthesia Care

## 2022-12-08 DIAGNOSIS — E1140 Type 2 diabetes mellitus with diabetic neuropathy, unspecified: Secondary | ICD-10-CM | POA: Diagnosis not present

## 2022-12-13 ENCOUNTER — Other Ambulatory Visit: Payer: Self-pay | Admitting: Internal Medicine

## 2022-12-13 DIAGNOSIS — E1142 Type 2 diabetes mellitus with diabetic polyneuropathy: Secondary | ICD-10-CM

## 2022-12-13 MED ORDER — HYDROCODONE-ACETAMINOPHEN 10-325 MG PO TABS: 1.0000 | ORAL_TABLET | Freq: Four times a day (QID) | ORAL | 0 refills | Status: DC | PRN

## 2022-12-23 ENCOUNTER — Other Ambulatory Visit (HOSPITAL_COMMUNITY): Payer: Self-pay

## 2022-12-30 ENCOUNTER — Ambulatory Visit (INDEPENDENT_AMBULATORY_CARE_PROVIDER_SITE_OTHER): Payer: Medicare Other | Admitting: Internal Medicine

## 2022-12-30 ENCOUNTER — Other Ambulatory Visit: Payer: Self-pay | Admitting: Internal Medicine

## 2022-12-30 ENCOUNTER — Encounter: Payer: Self-pay | Admitting: Internal Medicine

## 2022-12-30 VITALS — BP 144/76 | HR 69 | Ht 69.5 in | Wt 180.4 lb

## 2022-12-30 DIAGNOSIS — Z794 Long term (current) use of insulin: Secondary | ICD-10-CM | POA: Diagnosis not present

## 2022-12-30 DIAGNOSIS — Z125 Encounter for screening for malignant neoplasm of prostate: Secondary | ICD-10-CM | POA: Diagnosis not present

## 2022-12-30 DIAGNOSIS — E559 Vitamin D deficiency, unspecified: Secondary | ICD-10-CM | POA: Diagnosis not present

## 2022-12-30 DIAGNOSIS — F339 Major depressive disorder, recurrent, unspecified: Secondary | ICD-10-CM

## 2022-12-30 DIAGNOSIS — I10 Essential (primary) hypertension: Secondary | ICD-10-CM | POA: Diagnosis not present

## 2022-12-30 DIAGNOSIS — K861 Other chronic pancreatitis: Secondary | ICD-10-CM | POA: Diagnosis not present

## 2022-12-30 DIAGNOSIS — E782 Mixed hyperlipidemia: Secondary | ICD-10-CM

## 2022-12-30 DIAGNOSIS — D45 Polycythemia vera: Secondary | ICD-10-CM

## 2022-12-30 DIAGNOSIS — E1142 Type 2 diabetes mellitus with diabetic polyneuropathy: Secondary | ICD-10-CM

## 2022-12-30 DIAGNOSIS — E1140 Type 2 diabetes mellitus with diabetic neuropathy, unspecified: Secondary | ICD-10-CM | POA: Diagnosis not present

## 2022-12-30 DIAGNOSIS — I25118 Atherosclerotic heart disease of native coronary artery with other forms of angina pectoris: Secondary | ICD-10-CM

## 2022-12-30 DIAGNOSIS — N1831 Chronic kidney disease, stage 3a: Secondary | ICD-10-CM

## 2022-12-30 DIAGNOSIS — N401 Enlarged prostate with lower urinary tract symptoms: Secondary | ICD-10-CM

## 2022-12-30 DIAGNOSIS — H9313 Tinnitus, bilateral: Secondary | ICD-10-CM

## 2022-12-30 DIAGNOSIS — Z0001 Encounter for general adult medical examination with abnormal findings: Secondary | ICD-10-CM | POA: Diagnosis not present

## 2022-12-30 DIAGNOSIS — S30861A Insect bite (nonvenomous) of abdominal wall, initial encounter: Secondary | ICD-10-CM

## 2022-12-30 DIAGNOSIS — Z789 Other specified health status: Secondary | ICD-10-CM

## 2022-12-30 DIAGNOSIS — Z79899 Other long term (current) drug therapy: Secondary | ICD-10-CM

## 2022-12-30 DIAGNOSIS — G4701 Insomnia due to medical condition: Secondary | ICD-10-CM

## 2022-12-30 MED ORDER — DIAZEPAM 10 MG PO TABS
5.0000 mg | ORAL_TABLET | Freq: Every evening | ORAL | 0 refills | Status: DC | PRN
Start: 2022-12-30 — End: 2024-01-29

## 2022-12-30 MED ORDER — DULOXETINE HCL 30 MG PO CPEP: 30.0000 mg | ORAL_CAPSULE | Freq: Every day | ORAL | 3 refills | Status: DC

## 2022-12-30 NOTE — Assessment & Plan Note (Signed)
Has been evaluated by [facility]rology [facility]

## 2022-12-30 NOTE — Patient Instructions (Signed)
Please take Paxil 1/2 tablet once daily. Please start taking Cymbalta from next week.  Please continue to take medications as prescribed.  Please continue to follow low carb diet and perform moderate exercise/walking at least 150 mins/week.

## 2022-12-30 NOTE — Assessment & Plan Note (Signed)
On Plavix and B-blocker ?Nitroglycerin PRN for chest pain ?Follows up with Cardiologist ?

## 2022-12-30 NOTE — Assessment & Plan Note (Signed)
Physical exam as documented. Fasting blood tests today. Advised to get Shingrix and Tdap vaccines at local pharmacy. 

## 2022-12-30 NOTE — Assessment & Plan Note (Signed)
On 12/05/22 Erythema improving Keep area clean and dry

## 2022-12-30 NOTE — Addendum Note (Signed)
Addended byTrena Platt on: 12/30/2022 05:29 PM   Modules accepted: Orders

## 2022-12-30 NOTE — Assessment & Plan Note (Signed)
BP Readings from Last 1 Encounters:  12/30/22 (!) 144/76   Uncontrolled due to lack of sleep and pain today Usually better controlled with Metoprolol, Losartan and HCTZ Counseled for compliance with the medications Advised DASH diet and moderate exercise/walking, at least 150 mins/week

## 2022-12-30 NOTE — Assessment & Plan Note (Signed)
Well-controlled with Paxil Since he has uncontrolled peripheral neuropathy, switch to Cymbalta 30 mg QD

## 2022-12-30 NOTE — Progress Notes (Addendum)
Established Patient Office Visit  Subjective:  Patient ID: [name], male    DOB: [birth year]  Age: 62 y.o. MRN: [id number]  CC:  Chief Complaint  Patient presents with   Annual Exam    Patient has a recent tick bite, he did feel dizzy and lost focus. Patient is not sleeping well at night and is having issues with his ears ringing constantly.     HPI [name] is a 62 y.o. male with past medical history of CAD status post CABG, hypertension, uncontrolled diabetes mellitus-on insulin, severe diabetic neuropathy, chronic pancreatitis, polycythemia vera, hyperlipidemia, cervical spinal stenosis and chronic back pain who presents for annual physical.  HTN: BP is elevated today, but he reports that he did not sleep well yesterday and has been in severe neuropathic pain currently. Takes medications regularly. Patient denies headache, dizziness, chest pain, dyspnea or palpitations currently.  He had an episode of presyncope while working in his yard when he was bent down.  He did not lose consciousness, but had darkening of his vision.  DM: HbA1C was 7.4 in 11/23. He had visit with Dr Fransico Him and his insulin dose was increased to 60 U BID. He also takes Metformin. Denies any polyuria or polyphagia currently.  He has CGM device, and his blood glucose runs around 150-200 most of the time.   Diabetic neuropathy: Takes gabapentin and as needed Norco for severe pain.  He has tried gabapentin cream as well for neuropathy, but continues to have severe burning pain in his feet.  MDD: He takes Paxil for it.  Currently denies any apathy, SI or HI currently.  He is willing to switch to Cymbalta for better neuropathic pain coverage.  He reports chronic insomnia, which he attributes to neuropathic pain.  He also reports chronic tinnitus.  Denies any ear pain or discharge.  He has hearing aid, and he reports that it helps with tinnitus at times when he uses hearing aid.  He had a tick bite on 12/05/22  on his abdominal wall, and has mild redness in the area.  Denies any recent fever, chills, nausea or vomiting.   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  Patent coronary bypass grafts with a free radial graft to PDA and LIMA to the  diag.   Diabetes mellitus    Diabetic peripheral neuropathy (HCC) 05/06/2020   Dyslipidemia    Poorly controlled, probably related to his DM (Some of this is secondary to his inability to afford medications).   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/2012   PONV (postoperative nausea and vomiting)     Past Surgical History:  Procedure Laterality Date   APPENDECTOMY     BIOPSY  11/11/2021   Procedure: BIOPSY;  Surgeon: Malissa Hippo, MD;  [facility]: [facility];  Service: Endoscopy;;   CARDIAC CATHETERIZATION     CHOLECYSTECTOMY     COLONOSCOPY  07/31/1985   COLONOSCOPY N/A 03/11/2016   Procedure: COLONOSCOPY;  Surgeon: Malissa Hippo, MD;  Location: [facility];  Service: Endoscopy;  Laterality: N/A;   COLONOSCOPY WITH PROPOFOL N/A 11/11/2021   Procedure: COLONOSCOPY WITH PROPOFOL;  Surgeon: Malissa Hippo, MD;  Location: [facility];  Service: Endoscopy;  Laterality: N/A;  205   CORONARY ANGIOPLASTY WITH STENT PLACEMENT  2006   CORONARY ARTERY BYPASS GRAFT  11/24/2004   ESOPHAGOGASTRODUODENOSCOPY  09/19/01   ESOPHAGOGASTRODUODENOSCOPY N/A 03/11/2016   Procedure: ESOPHAGOGASTRODUODENOSCOPY (EGD);  Surgeon: Malissa Hippo, MD;  [facility]: [facility];  Service: Endoscopy;  Laterality: N/A;  12:00   ESOPHAGOGASTRODUODENOSCOPY (EGD) WITH PROPOFOL N/A 11/11/2021   Procedure: ESOPHAGOGASTRODUODENOSCOPY  (EGD) WITH PROPOFOL;  Surgeon: Malissa Hippo, MD;  Location: [facility];  Service: Endoscopy;  Laterality: N/A;   FINGER TENDON REPAIR     in middle finger   KNEE SURGERY     left knee   NASAL SEPTUM SURGERY     POLYPECTOMY  11/11/2021   Procedure: POLYPECTOMY;  Surgeon: Malissa Hippo, MD;  Location: [facility];  Service: Endoscopy;;    Family History  Problem Relation Age of Onset   Coronary artery disease Mother        strong family history of   Heart attack Mother        dying of (at age 75)   Diabetes Mother    Diabetes Sister    Diabetes Brother    Pancreatitis Brother    Healthy Daughter    Hyperlipidemia Daughter    Heart disease Brother    Hypertension Brother    Hyperlipidemia Brother     Social History   Socioeconomic History   Marital status: Married    Spouse name: Not on file   Number of children: Not on file   Years of education: Not on file   Highest education level: Not on file  Occupational History   Not on file  Tobacco Use   Smoking status: Never   Smokeless tobacco: Never  Vaping Use   Vaping Use: Never used  Substance and Sexual Activity   Alcohol use: Not Currently    Comment: Patient may drink a beer or glass of wine a couple times a month   Drug use: No   Sexual activity: Not on file  Other Topics Concern   Not on file  Social History Narrative   The patient lives in [place] with his wife.  He is     disabled secondary to coronary artery disease and pancreatitis.  He does     not smoke, he does not drink alcohol.  There is no drug use.    Social Determinants of Health   Financial Resource Strain: Low Risk  (11/02/2021)   Overall Financial Resource Strain (CARDIA)    Difficulty of Paying Living Expenses: Not hard at all  Food Insecurity: No Food Insecurity (11/02/2021)   Hunger Vital Sign    Worried About Running Out of Food in the Last Year: Never true    Ran Out of Food in the Last Year: Never true  Transportation  Needs: No Transportation Needs (11/02/2021)   PRAPARE - Administrator, Civil Service (Medical): No    Lack of Transportation (Non-Medical): No  Physical Activity: Inactive (11/02/2021)   Exercise Vital Sign    Days of Exercise per Week: 0 days    Minutes of Exercise per Session: 0 min  Stress: No Stress Concern Present (11/02/2021)   [employer] of Occupational Health - Occupational Stress Questionnaire    Feeling of Stress : Not at all  Social  Connections: Moderately Integrated (11/02/2021)   Social Connection and Isolation Panel [NHANES]    Frequency of Communication with Friends and Family: Three times a week    Frequency of Social Gatherings with Friends and Family: Not on file    Attends Religious Services: More than 4 times per year    Active Member of [employer] or Organizations: No    Attends Banker Meetings: Never    Marital Status: Married  Catering manager Violence: Not on file    Outpatient Medications Prior to Visit  Medication Sig Dispense Refill   Bempedoic Acid 180 MG TABS Take 180 mg by mouth daily. (Patient not taking: Reported on 12/22/2021) 30 tablet 11   Cholecalciferol 125 MCG (5000 UT) TABS Take 10,000 Units by mouth daily.     ciprofloxacin (CIPRO) 500 MG tablet Take 1 tablet (500 mg total) by mouth every 12 (twelve) hours. 14 tablet 0   clopidogrel (PLAVIX) 75 MG tablet Take 1 tablet (75 mg total) by mouth daily. 90 tablet 3   Continuous Blood Gluc Receiver (DEXCOM G7 RECEIVER) DEVI USE TO CHECK GLUCOSE AS NEEDED 1 each 0   Continuous Blood Gluc Sensor (DEXCOM G7 SENSOR) MISC CHANGE SENSOR EVERY 10 DAYS 3 each 2   Continuous Blood Gluc Transmit (DEXCOM G6 TRANSMITTER) MISC Use 1 transmitter every 90 days. 1 each 1   ezetimibe (ZETIA) 10 MG tablet TAKE 1 TABLET BY MOUTH EVERY DAY 90 tablet 0   gabapentin (NEURONTIN) 300 MG capsule TAKE (2) CAPSULES BY MOUTH (3) TIMES DAILY. 540 capsule 0   glucose blood (FREESTYLE TEST STRIPS) test strip  Use as instructed 100 each 12   hydrochlorothiazide (HYDRODIURIL) 25 MG tablet Take 1 tablet (25 mg total) by mouth in the morning. 90 tablet 2   HYDROcodone-acetaminophen (NORCO) 10-325 MG tablet Take 1 tablet by mouth every 6 (six) hours as needed for severe pain. 120 tablet 0   icosapent Ethyl (VASCEPA) 1 g capsule Take 2 capsules (2 g total) by mouth 2 (two) times daily. Call and schedule follow up office visit to receive further refills. Dr Antoine Poche [phone] 60 capsule 0   insulin NPH-regular Human (NOVOLIN 70/30) (70-30) 100 UNIT/ML injection Inject 60 Units into the skin 2 (two) times daily before a meal. When glucose is above 90 and eating     lansoprazole (PREVACID) 30 MG capsule Take 1 capsule (30 mg total) by mouth daily at 12 noon. 90 capsule 3   levofloxacin (LEVAQUIN) 500 MG tablet Take 1 tablet (500 mg total) by mouth daily. 10 tablet 0   losartan (COZAAR) 50 MG tablet Take 1 tablet (50 mg total) by mouth daily. (Patient taking differently: Take 25 mg by mouth at bedtime.) 90 tablet 1   metFORMIN (GLUCOPHAGE-XR) 500 MG 24 hr tablet Take 1 tablet (500 mg total) by mouth daily with breakfast. 90 tablet 1   methocarbamol (ROBAXIN) 500 MG tablet TAKE 1 TABLET BY MOUTH EVERY 8 HOURS AS NEEDED FOR MUSCLE SPASMS. 30 tablet 0   metoprolol tartrate (LOPRESSOR) 25 MG tablet TAKE 1 TABLET BY MOUTH TWICE A DAY. 60 tablet 5   nitroGLYCERIN (NITROSTAT) 0.4 MG SL tablet Place 0.4 mg under the tongue every 5 (five) minutes as needed for chest pain. Reported on 02/26/2016     NON FORMULARY Take 20 mg by mouth daily. Domperidone (Motilium) 10 mg (Patient not taking: Reported on 10/25/2022)     NONFORMULARY OR COMPOUNDED ITEM Apply 1 application. topically daily as needed (neuropathy). Compounded cream for  foot pain. Gets at [facility].     ondansetron (ZOFRAN) 4 MG tablet TAKE (1) TABLET BY MOUTH (3) TIMES DAILY AS NEEDED FOR NAUSEA. 30 tablet 0   polyethylene glycol (MIRALAX / GLYCOLAX) 17 g packet  Take 17 g by mouth daily as needed for moderate constipation.     promethazine (PHENERGAN) 25 MG tablet Take 25 mg by mouth every 6 (six) hours as needed for nausea or vomiting. Take 1/4 to 1/2 tablet by mouth as needed     silodosin (RAPAFLO) 8 MG CAPS capsule Take 1 capsule (8 mg total) by mouth at bedtime. 30 capsule 11   tadalafil (CIALIS) 5 MG tablet Take 1-4 tabs po prn.  Don't take within 4 hours of tamsulosin or with the NTG. 30 tablet 5   diazepam (VALIUM) 10 MG tablet Take 0.5 tablets (5 mg total) by mouth at bedtime as needed for anxiety or sleep. 30 tablet 0   PARoxetine (PAXIL) 10 MG tablet TAKE 1 TABLET BY MOUTH ONCE DAILY 90 tablet 0   No facility-administered medications prior to visit.    Allergies  Allergen Reactions   Iodinated Contrast Media Hives, Itching and Nausea Only    Flushing, Burning, itching Other reaction(s): Angioedema (ALLERGY/intolerance)    Iohexol Hives and Shortness Of Breath     Desc: HIVES,SOB NEEDS PREP.MEDS    Metoclopramide Hcl Hives    Patient became very spastic   Alfuzosin Hcl Er     Dropped blood pressure   Sulfonamide Derivatives Hives    Large hives   Enalapril Cough   Penicillins Rash    Has patient had a PCN reaction causing immediate rash, facial/tongue/throat swelling, SOB or lightheadedness with hypotension: Yes Has patient had a PCN reaction causing severe rash involving mucus membranes or skin necrosis: No Has patient had a PCN reaction that required hospitalization No Has patient had a PCN reaction occurring within the last 10 years: No. If all of the above answers are "NO", then may proceed with Cephalosporin use.    Sulfa Antibiotics Rash    Other Reaction: Not Assessed    ROS Review of Systems  Constitutional:  Negative for chills and fever.  HENT:  Positive for tinnitus. Negative for congestion and sore throat.        Hearing difficulty  Eyes:  Negative for pain and discharge.  Respiratory:  Negative for cough and  shortness of breath.   Cardiovascular:  Negative for palpitations.  Gastrointestinal:  Negative for constipation, diarrhea, nausea and vomiting.  Endocrine: Negative for polydipsia and polyuria.  Genitourinary:  Positive for difficulty urinating. Negative for hematuria.  Musculoskeletal:  Positive for arthralgias, back pain and neck pain. Negative for neck stiffness.  Skin:  Positive for color change. Negative for rash.  Neurological:  Positive for numbness (B/l LE, intermittent). Negative for dizziness, weakness and headaches.  Psychiatric/Behavioral:  Negative for agitation and behavioral problems.       Objective:    Physical Exam Vitals reviewed.  Constitutional:      General: He is not in acute distress.    Appearance: He is not diaphoretic.  HENT:     Head: Normocephalic and atraumatic.     Nose: Nose normal.     Mouth/Throat:     Mouth: Mucous membranes are moist.  Eyes:     General: No scleral icterus.    Extraocular Movements: Extraocular movements intact.  Cardiovascular:     Rate and Rhythm: Normal rate and regular rhythm.  Pulses: Normal pulses.     Heart sounds: Normal heart sounds. No murmur heard. Pulmonary:     Breath sounds: Normal breath sounds. No wheezing or rales.  Abdominal:     Palpations: Abdomen is soft.     Tenderness: There is no abdominal tenderness.  Musculoskeletal:     Cervical back: Neck supple. No tenderness.     Right lower leg: No edema.     Left lower leg: No edema.  Skin:    General: Skin is warm.     Findings: Erythema (Over abdominal wall, about 1 cm in diameter) present. No rash.  Neurological:     General: No focal deficit present.     Mental Status: He is alert and oriented to person, place, and time.     Cranial Nerves: No cranial nerve deficit.     Sensory: Sensory deficit (B/l feet) present.     Motor: No weakness.  Psychiatric:        Mood and Affect: Mood normal.        Behavior: Behavior normal.     BP (!)  144/76 (BP Location: Left Arm)   Pulse 69   Ht 5' 9.5" (1.765 m)   Wt 180 lb 6.4 oz (81.8 kg)   SpO2 96%   BMI 26.26 kg/m  Wt Readings from Last 3 Encounters:  12/30/22 180 lb 6.4 oz (81.8 kg)  10/25/22 175 lb 8 oz (79.6 kg)  08/12/22 177 lb 3.2 oz (80.4 kg)    Lab Results  Component Value Date   TSH 3.480 07/21/2022   Lab Results  Component Value Date   WBC 6.8 08/12/2022   HGB 14.9 08/12/2022   HCT 47.5 08/12/2022   MCV 76 (L) 08/12/2022   PLT 224 08/12/2022   Lab Results  Component Value Date   NA 133 (L) 07/21/2022   K 4.3 07/21/2022   CO2 21 07/21/2022   GLUCOSE 217 (H) 07/21/2022   BUN 39 (H) 07/21/2022   CREATININE 1.55 (H) 07/21/2022   BILITOT 0.6 07/21/2022   ALKPHOS 74 07/21/2022   AST 22 07/21/2022   ALT 15 07/21/2022   PROT 6.8 07/21/2022   ALBUMIN 4.3 07/21/2022   CALCIUM 9.4 07/21/2022   ANIONGAP 13 11/06/2021   EGFR 51 (L) 07/21/2022   Lab Results  Component Value Date   CHOL 222 (H) 07/21/2022   Lab Results  Component Value Date   HDL 24 (L) 07/21/2022   Lab Results  Component Value Date   LDLCALC Comment (A) 07/21/2022   Lab Results  Component Value Date   TRIG 852 (HH) 07/21/2022   Lab Results  Component Value Date   CHOLHDL 9.3 (H) 07/21/2022   Lab Results  Component Value Date   HGBA1C 7.4 (A) 07/22/2022      Assessment & Plan:   Problem List Items Addressed This Visit       Cardiovascular and Mediastinum   Essential hypertension, benign    BP Readings from Last 1 Encounters:  12/30/22 (!) 144/76  Uncontrolled due to lack of sleep and pain today Usually better controlled with Metoprolol, Losartan and HCTZ Counseled for compliance with the medications Advised DASH diet and moderate exercise/walking, at least 150 mins/week      Relevant Orders   TSH   CMP14+EGFR   CBC with Differential/Platelet   Coronary artery disease of native artery of native heart with stable angina pectoris (HCC)    On Plavix and  B-blocker Nitroglycerin PRN for chest pain Follows  up with Cardiologist      Relevant Medications   DULoxetine (CYMBALTA) 30 MG capsule     Digestive   Chronic pancreatitis (HCC)    Related to previous episode of 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 Was on pancrelipase Follows up with GI      Relevant Orders   CMP14+EGFR   CBC with Differential/Platelet     Endocrine   Diabetic peripheral neuropathy (HCC)    Long-standing, severe b/l LE pain On Gabapentin 600 mg TID and Norco PRN Still uncontrolled, added Cymbalta SSRI and Valium for chronic pain and anxiety related to neuropathy Has Gabapentin cream for neuropathy      Relevant Medications   DULoxetine (CYMBALTA) 30 MG capsule   diazepam (VALIUM) 10 MG tablet   Other Relevant Orders   CMP14+EGFR   CBC with Differential/Platelet   Type 2 diabetes mellitus with diabetic neuropathy, with long-term current use of insulin (HCC)    Lab Results  Component Value Date   HGBA1C 7.4 (A) 07/22/2022  Uncontrolled, but improving On Novolin 60 U BID and Metformin, f/u with Dr Fransico Him Advised to follow diabetic diet -has joined diabetes reversal group Not on any statin due to statin intolerance Diabetic eye exam: Advised to follow up with Ophthalmology for diabetic eye exam      Relevant Orders   Hemoglobin A1c   CMP14+EGFR   Urine Microalbumin w/creat. ratio     Musculoskeletal and Integument   Tick bite of abdominal wall    On 12/05/22 Erythema improving Keep area clean and dry        Genitourinary   Benign prostatic hyperplasia with weak urinary stream    Has been evaluated by Urology On Rapaflo      Relevant Orders   PSA   Stage 3a chronic kidney disease (HCC)    Last BMP reviewed, stable GFR around 50 Advised to maintain adequate hydration On losartan Avoid nephrotoxic agents        Other   Polycythemia vera (HCC)    JAK2 negative Used to get venipuncture every 3-4  months F/u Heme/Onc. Check CBC His Hb has been stable recently without venipuncture, denies any recent signs of bleeding      Relevant Medications   diazepam (VALIUM) 10 MG tablet   Other Relevant Orders   CBC with Differential/Platelet   Mixed hyperlipidemia   Relevant Orders   Lipid panel   Depression, recurrent (HCC)    Well-controlled with Paxil Since he has uncontrolled peripheral neuropathy, switch to Cymbalta 30 mg QD      Relevant Medications   DULoxetine (CYMBALTA) 30 MG capsule   diazepam (VALIUM) 10 MG tablet   Other Relevant Orders   TSH   Encounter for general adult medical examination with abnormal findings - Primary    Physical exam as documented. Fasting blood tests today. Advised to get Shingrix and Tdap vaccines at local pharmacy.      Statin intolerance    Has tried Lipitor and Crestor, has severe myopathy On Zetia currently      Tinnitus of both ears    Needs to wear hearing aid regularly      Other Visit Diagnoses     Chronic prescription benzodiazepine use       Relevant Orders   ToxASSURE Select 13 (MW), Urine   Vitamin D deficiency       Relevant Orders   VITAMIN D 25 Hydroxy (Vit-D Deficiency, Fractures)   Insomnia due to medical condition  Relevant Medications   diazepam (VALIUM) 10 MG tablet       Meds ordered this encounter  Medications   DULoxetine (CYMBALTA) 30 MG capsule    Sig: Take 1 capsule (30 mg total) by mouth daily.    Dispense:  30 capsule    Refill:  3   diazepam (VALIUM) 10 MG tablet    Sig: Take 0.5 tablets (5 mg total) by mouth at bedtime as needed for anxiety or sleep.    Dispense:  30 tablet    Refill:  0    Follow-up: Return in about 3 months (around 04/01/2023) for DM, neuropathy and MDD.    [name], MD

## 2022-12-30 NOTE — Assessment & Plan Note (Signed)
Last BMP reviewed, stable GFR around 50 Advised to maintain adequate hydration On losartan Avoid nephrotoxic agents

## 2022-12-30 NOTE — Assessment & Plan Note (Signed)
Needs to wear hearing aid regularly

## 2022-12-30 NOTE — Assessment & Plan Note (Signed)
Long-standing, severe b/l LE pain On Gabapentin 600 mg TID and Norco PRN Still uncontrolled, added Cymbalta SSRI and Valium for chronic pain and anxiety related to neuropathy Has Gabapentin cream for neuropathy

## 2022-12-30 NOTE — Assessment & Plan Note (Signed)
JAK2 negative Used to get venipuncture every 3-4 months F/u Heme/Onc. Check CBC His Hb has been stable recently without venipuncture, denies any recent signs of bleeding

## 2022-12-30 NOTE — Assessment & Plan Note (Signed)
Lab Results  Component Value Date   HGBA1C 7.4 (A) 07/22/2022   Uncontrolled, but improving On Novolin 60 U BID and Metformin, f/u with Dr Nida Advised to follow diabetic diet -has joined diabetes reversal group Not on any statin due to statin intolerance Diabetic eye exam: Advised to follow up with Ophthalmology for diabetic eye exam 

## 2022-12-30 NOTE — Assessment & Plan Note (Addendum)
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

## 2022-12-30 NOTE — Assessment & Plan Note (Signed)
Has tried Lipitor and Crestor, has severe myopathy On Zetia currently 

## 2023-01-01 ENCOUNTER — Encounter: Payer: Self-pay | Admitting: Internal Medicine

## 2023-01-01 LAB — CBC WITH DIFFERENTIAL/PLATELET
Basophils Absolute: 0 10*3/uL (ref 0.0–0.2)
Basos: 1 %
EOS (ABSOLUTE): 0.2 10*3/uL (ref 0.0–0.4)
Eos: 3 %
Hematocrit: 47.5 % (ref 37.5–51.0)
Hemoglobin: 15.8 g/dL (ref 13.0–17.7)
Immature Grans (Abs): 0 10*3/uL (ref 0.0–0.1)
Immature Granulocytes: 0 %
Lymphocytes Absolute: 1.5 10*3/uL (ref 0.7–3.1)
Lymphs: 26 %
MCH: 25.7 pg — ABNORMAL LOW (ref 26.6–33.0)
MCHC: 33.3 g/dL (ref 31.5–35.7)
MCV: 77 fL — ABNORMAL LOW (ref 79–97)
Monocytes Absolute: 0.4 10*3/uL (ref 0.1–0.9)
Monocytes: 7 %
Neutrophils Absolute: 3.8 10*3/uL (ref 1.4–7.0)
Neutrophils: 63 %
Platelets: 194 10*3/uL (ref 150–450)
RBC: 6.14 x10E6/uL — ABNORMAL HIGH (ref 4.14–5.80)
RDW: 19.1 % — ABNORMAL HIGH (ref 11.6–15.4)
WBC: 5.9 10*3/uL (ref 3.4–10.8)

## 2023-01-01 LAB — HEMOGLOBIN A1C
Est. average glucose Bld gHb Est-mCnc: 174 mg/dL
Hgb A1c MFr Bld: 7.7 % — ABNORMAL HIGH (ref 4.8–5.6)

## 2023-01-01 LAB — TSH: TSH: 3.11 u[IU]/mL (ref 0.450–4.500)

## 2023-01-01 LAB — LIPID PANEL
Chol/HDL Ratio: 28 ratio — ABNORMAL HIGH (ref 0.0–5.0)
Cholesterol, Total: 448 mg/dL — ABNORMAL HIGH (ref 100–199)
HDL: 16 mg/dL — ABNORMAL LOW (ref >39)
Triglycerides: 2960 mg/dL (ref 0–149)

## 2023-01-01 LAB — CMP14+EGFR
ALT: 19 IU/L (ref 0–44)
AST: 25 IU/L (ref 0–40)
Albumin/Globulin Ratio: 1.6 (ref 1.2–2.2)
Albumin: 4.1 g/dL (ref 3.8–4.9)
Alkaline Phosphatase: 73 IU/L (ref 44–121)
BUN/Creatinine Ratio: 17 (ref 10–24)
BUN: 24 mg/dL (ref 8–27)
Bilirubin Total: 0.4 mg/dL (ref 0.0–1.2)
CO2: 18 mmol/L — ABNORMAL LOW (ref 20–29)
Calcium: 10 mg/dL (ref 8.6–10.2)
Chloride: 96 mmol/L (ref 96–106)
Creatinine, Ser: 1.43 mg/dL — ABNORMAL HIGH (ref 0.76–1.27)
Globulin, Total: 2.6 g/dL (ref 1.5–4.5)
Glucose: 133 mg/dL — ABNORMAL HIGH (ref 70–99)
Potassium: 4.3 mmol/L (ref 3.5–5.2)
Sodium: 135 mmol/L (ref 134–144)
Total Protein: 6.7 g/dL (ref 6.0–8.5)
eGFR: 56 mL/min/{1.73_m2} — ABNORMAL LOW (ref >59)

## 2023-01-01 LAB — VITAMIN D 25 HYDROXY (VIT D DEFICIENCY, FRACTURES)

## 2023-01-01 LAB — MICROALBUMIN / CREATININE URINE RATIO
Creatinine, Urine: 108.2 mg/dL
Microalb/Creat Ratio: 78 mg/g creat — ABNORMAL HIGH (ref 0–29)
Microalbumin, Urine: 84.9 ug/mL

## 2023-01-01 LAB — PSA: Prostate Specific Ag, Serum: 2.4 ng/mL (ref 0.0–4.0)

## 2023-01-03 ENCOUNTER — Ambulatory Visit: Payer: Medicare Other | Admitting: Urology

## 2023-01-03 ENCOUNTER — Encounter: Payer: Self-pay | Admitting: Urology

## 2023-01-03 VITALS — BP 182/81 | HR 70

## 2023-01-03 DIAGNOSIS — N401 Enlarged prostate with lower urinary tract symptoms: Secondary | ICD-10-CM

## 2023-01-03 DIAGNOSIS — N138 Other obstructive and reflux uropathy: Secondary | ICD-10-CM | POA: Diagnosis not present

## 2023-01-03 DIAGNOSIS — R39198 Other difficulties with micturition: Secondary | ICD-10-CM

## 2023-01-03 DIAGNOSIS — N5201 Erectile dysfunction due to arterial insufficiency: Secondary | ICD-10-CM | POA: Diagnosis not present

## 2023-01-03 MED ORDER — TADALAFIL 20 MG PO TABS
20.0000 mg | ORAL_TABLET | Freq: Every day | ORAL | 5 refills | Status: DC | PRN
Start: 2023-01-03 — End: 2023-09-21

## 2023-01-03 MED ORDER — ALFUZOSIN HCL ER 10 MG PO TB24
10.0000 mg | ORAL_TABLET | Freq: Every evening | ORAL | 11 refills | Status: DC
Start: 2023-01-03 — End: 2023-09-21

## 2023-01-03 NOTE — Patient Instructions (Signed)

## 2023-01-03 NOTE — Progress Notes (Unsigned)
01/03/2023 2:04 PM   [name] [birth year] [id number]  Referring provider: [name], MD [address] [place],  [place] [address]  Chief Complaint  Patient presents with   Follow-up    HPI: [name] is a 62yo here for followup for BPH with a weak urinary stream. He was started on rapaflo 8mg  last visit. He takes it intermittently. He notes an strong urine odor on rapalfo 8mg . IPSS 21 QOL 3. Flomax previously dropped his blood pressure. No prior use of uroxatral.    PMH: Past Medical History:  Diagnosis Date   Anemia    Bypass graft stenosis (HCC)    '06   Cancer (HCC)    Polycythemia vera   Cervical spinal stenosis 05/06/2020   C5-6 level   Chronic gastritis    Coronary artery disease    Patent Stent to Circ.  Patent coronary bypass grafts with a free radial graft to PDA and LIMA to the  diag.   Diabetes mellitus    Diabetic peripheral neuropathy (HCC) 05/06/2020   Dyslipidemia    Poorly controlled, probably related to his DM (Some of this is secondary to his inability to afford medications).   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/2012   PONV (postoperative nausea and vomiting)     Surgical History: Past Surgical History:  Procedure Laterality Date   APPENDECTOMY     BIOPSY  11/11/2021   Procedure: BIOPSY;  Surgeon: Malissa Hippo, MD;  [facility];  Service: Endoscopy;;   CARDIAC CATHETERIZATION     CHOLECYSTECTOMY     COLONOSCOPY  07/31/1985   COLONOSCOPY N/A 03/11/2016   Procedure: COLONOSCOPY;  Surgeon: Malissa Hippo, MD;  [facility]: [facility];  Service: Endoscopy;  Laterality: N/A;   COLONOSCOPY WITH PROPOFOL N/A 11/11/2021   Procedure: COLONOSCOPY WITH PROPOFOL;  Surgeon: Malissa Hippo, MD;  [facility]: [facility];  Service: Endoscopy;  Laterality: N/A;  205   CORONARY ANGIOPLASTY  WITH STENT PLACEMENT  2006   CORONARY ARTERY BYPASS GRAFT  11/24/2004   ESOPHAGOGASTRODUODENOSCOPY  09/19/01   ESOPHAGOGASTRODUODENOSCOPY N/A 03/11/2016   Procedure: ESOPHAGOGASTRODUODENOSCOPY (EGD);  Surgeon: Malissa Hippo, MD;  [facility];  Service: Endoscopy;  Laterality: N/A;  12:00   ESOPHAGOGASTRODUODENOSCOPY (EGD) WITH PROPOFOL N/A 11/11/2021   Procedure: ESOPHAGOGASTRODUODENOSCOPY (EGD) WITH PROPOFOL;  Surgeon: Malissa Hippo, MD;  [facility];  Service: Endoscopy;  Laterality: N/A;   FINGER TENDON REPAIR     in middle finger   KNEE SURGERY     left knee   NASAL SEPTUM SURGERY     POLYPECTOMY  11/11/2021   Procedure: POLYPECTOMY;  Surgeon: Malissa Hippo, MD;  [facility]: [facility];  Service: Endoscopy;;    Home Medications:  Allergies as of 01/03/2023       Reactions   Iodinated Contrast Media Hives, Itching, Nausea Only   Flushing, Burning, itching Other reaction(s): Angioedema (ALLERGY/intolerance)   Iohexol Hives, Shortness Of Breath    Desc: HIVES,SOB NEEDS PREP.MEDS   Metoclopramide Hcl Hives   Patient became very spastic   Alfuzosin Hcl Er    Dropped blood pressure   Sulfonamide Derivatives Hives   Large hives   Enalapril Cough   Penicillins Rash   Has patient had a PCN reaction causing immediate rash, facial/tongue/throat swelling,  SOB or lightheadedness with hypotension: Yes Has patient had a PCN reaction causing severe rash involving mucus membranes or skin necrosis: No Has patient had a PCN reaction that required hospitalization No Has patient had a PCN reaction occurring within the last 10 years: No. If all of the above answers are "NO", then may proceed with Cephalosporin use.   Sulfa Antibiotics Rash   Other Reaction: Not Assessed        Medication List        Accurate as of Jan 03, 2023  2:04 PM. If you have any questions, ask your nurse or doctor.          STOP taking these medications    Bempedoic Acid 180  MG Tabs Stopped by: Wilkie Aye, MD   Dexcom G6 Transmitter Misc Stopped by: Wilkie Aye, MD   levofloxacin 500 MG tablet Commonly known as: LEVAQUIN Stopped by: Wilkie Aye, MD   NON FORMULARY Stopped by: Wilkie Aye, MD       TAKE these medications    Cholecalciferol 125 MCG (5000 UT) Tabs Take 10,000 Units by mouth daily.   ciprofloxacin 500 MG tablet Commonly known as: CIPRO Take 1 tablet (500 mg total) by mouth every 12 (twelve) hours.   clopidogrel 75 MG tablet Commonly known as: PLAVIX Take 1 tablet (75 mg total) by mouth daily.   Dexcom G7 Receiver Devi USE TO CHECK GLUCOSE AS NEEDED   Dexcom G7 Sensor Misc CHANGE SENSOR EVERY 10 DAYS   diazepam 10 MG tablet Commonly known as: VALIUM Take 0.5 tablets (5 mg total) by mouth at bedtime as needed for anxiety or sleep.   DULoxetine 30 MG capsule Commonly known as: CYMBALTA Take 1 capsule (30 mg total) by mouth daily.   ezetimibe 10 MG tablet Commonly known as: ZETIA TAKE 1 TABLET BY MOUTH EVERY DAY   FREESTYLE TEST STRIPS test strip Generic drug: glucose blood Use as instructed   gabapentin 300 MG capsule Commonly known as: NEURONTIN TAKE (2) CAPSULES BY MOUTH (3) TIMES DAILY.   hydrochlorothiazide 25 MG tablet Commonly known as: HYDRODIURIL Take 1 tablet (25 mg total) by mouth in the morning.   HYDROcodone-acetaminophen 10-325 MG tablet Commonly known as: NORCO Take 1 tablet by mouth every 6 (six) hours as needed for severe pain.   icosapent Ethyl 1 g capsule Commonly known as: VASCEPA Take 2 capsules (2 g total) by mouth 2 (two) times daily. Call and schedule follow up office visit to receive further refills. Dr Antoine Poche [phone]   insulin NPH-regular Human (70-30) 100 UNIT/ML injection Inject 60 Units into the skin 2 (two) times daily before a meal. When glucose is above 90 and eating   lansoprazole 30 MG capsule Commonly known as: PREVACID Take 1 capsule (30 mg  total) by mouth daily at 12 noon.   losartan 50 MG tablet Commonly known as: COZAAR Take 1 tablet (50 mg total) by mouth daily. What changed:  how much to take when to take this   metFORMIN 500 MG 24 hr tablet Commonly known as: GLUCOPHAGE-XR Take 1 tablet (500 mg total) by mouth daily with breakfast.   methocarbamol 500 MG tablet Commonly known as: ROBAXIN TAKE 1 TABLET BY MOUTH EVERY 8 HOURS AS NEEDED FOR MUSCLE SPASMS.   metoprolol tartrate 25 MG tablet Commonly known as: LOPRESSOR TAKE 1 TABLET BY MOUTH TWICE A DAY.   nitroGLYCERIN 0.4 MG SL tablet Commonly known as: NITROSTAT Place 0.4 mg under the tongue every 5 (five) minutes as  needed for chest pain. Reported on 02/26/2016   NONFORMULARY OR COMPOUNDED ITEM Apply 1 application. topically daily as needed (neuropathy). Compounded cream for foot pain. Gets at [facility].   ondansetron 4 MG tablet Commonly known as: ZOFRAN TAKE (1) TABLET BY MOUTH (3) TIMES DAILY AS NEEDED FOR NAUSEA.   polyethylene glycol 17 g packet Commonly known as: MIRALAX / GLYCOLAX Take 17 g by mouth daily as needed for moderate constipation.   promethazine 25 MG tablet Commonly known as: PHENERGAN Take 25 mg by mouth every 6 (six) hours as needed for nausea or vomiting. Take 1/4 to 1/2 tablet by mouth as needed   silodosin 8 MG Caps capsule Commonly known as: RAPAFLO Take 1 capsule (8 mg total) by mouth at bedtime.   tadalafil 5 MG tablet Commonly known as: CIALIS Take 1-4 tabs po prn.  Don't take within 4 hours of tamsulosin or with the NTG.        Allergies:  Allergies  Allergen Reactions   Iodinated Contrast Media Hives, Itching and Nausea Only    Flushing, Burning, itching Other reaction(s): Angioedema (ALLERGY/intolerance)    Iohexol Hives and Shortness Of Breath     Desc: HIVES,SOB NEEDS PREP.MEDS    Metoclopramide Hcl Hives    Patient became very spastic   Alfuzosin Hcl Er     Dropped blood pressure   Sulfonamide  Derivatives Hives    Large hives   Enalapril Cough   Penicillins Rash    Has patient had a PCN reaction causing immediate rash, facial/tongue/throat swelling, SOB or lightheadedness with hypotension: Yes Has patient had a PCN reaction causing severe rash involving mucus membranes or skin necrosis: No Has patient had a PCN reaction that required hospitalization No Has patient had a PCN reaction occurring within the last 10 years: No. If all of the above answers are "NO", then may proceed with Cephalosporin use.    Sulfa Antibiotics Rash    Other Reaction: Not Assessed    Family History: Family History  Problem Relation Age of Onset   Coronary artery disease Mother        strong family history of   Heart attack Mother        dying of (at age 85)   Diabetes Mother    Diabetes Sister    Diabetes Brother    Pancreatitis Brother    Healthy Daughter    Hyperlipidemia Daughter    Heart disease Brother    Hypertension Brother    Hyperlipidemia Brother     Social History:  reports that he has never smoked. He has never used smokeless tobacco. He reports that he does not currently use alcohol. He reports that he does not use drugs.  ROS: All other review of systems were reviewed and are negative except what is noted above in HPI  Physical Exam: BP (!) 182/81   Pulse 70   Constitutional:  Alert and oriented, No acute distress. HEENT:  AT, moist mucus membranes.  Trachea midline, no masses. Cardiovascular: No clubbing, cyanosis, or edema. Respiratory: Normal respiratory effort, no increased work of breathing. GI: Abdomen is soft, nontender, nondistended, no abdominal masses GU: No CVA tenderness.  Lymph: No cervical or inguinal lymphadenopathy. Skin: No rashes, bruises or suspicious lesions. Neurologic: Grossly intact, no focal deficits, moving all 4 extremities. Psychiatric: Normal mood and affect.  Laboratory Data: Lab Results  Component Value Date   WBC 5.9 12/30/2022    HGB 15.8 12/30/2022   HCT 47.5 12/30/2022  MCV 77 (L) 12/30/2022   PLT 194 12/30/2022    Lab Results  Component Value Date   CREATININE 1.43 (H) 12/30/2022    No results found for: "PSA"  No results found for: "TESTOSTERONE"  Lab Results  Component Value Date   HGBA1C 7.7 (H) 12/30/2022    Urinalysis    Component Value Date/Time   COLORURINE YELLOW 09/29/2021 1532   APPEARANCEUR Clear 10/06/2022 1117   LABSPEC 1.016 09/29/2021 1532   PHURINE 5.0 09/29/2021 1532   GLUCOSEU Negative 10/06/2022 1117   HGBUR NEGATIVE 09/29/2021 1532   BILIRUBINUR Negative 10/06/2022 1117   KETONESUR NEGATIVE 09/29/2021 1532   PROTEINUR 1+ (A) 10/06/2022 1117   PROTEINUR NEGATIVE 09/29/2021 1532   UROBILINOGEN 0.2 03/28/2012 1042   NITRITE Negative 10/06/2022 1117   NITRITE NEGATIVE 09/29/2021 1532   LEUKOCYTESUR Negative 10/06/2022 1117   LEUKOCYTESUR NEGATIVE 09/29/2021 1532    Lab Results  Component Value Date   LABMICR 84.9 12/30/2022   WBCUA 0-5 10/06/2022   LABEPIT 0-10 10/06/2022   MUCUS Present 05/14/2021   BACTERIA None seen 10/06/2022    Pertinent Imaging: *** Results for orders placed during the hospital encounter of 03/15/06  DG Abd 1 View  Narrative Clinical Data: Low back pain, question kidney stone. ABDOMEN - 1 VIEW: Findings: No unexpected radiopaque densities are seen over the renal shadows or expected course of the ureters or urinary bladder to suggest kidney stones. Bowel gas pattern is unremarkable. There is no focal bony abnormality.  Impression Negative for evidence of urinary tract calculi.   Provider: Lauris Poag  No results found for this or any previous visit.  No results found for this or any previous visit.  No results found for this or any previous visit.  No results found for this or any previous visit.  No valid procedures specified. No results found for this or any previous visit.  Results for orders placed during the hospital  encounter of 08/27/18  CT Renal Stone Study  Narrative CLINICAL DATA:  Initial evaluation for intermittent left-sided flank pain for 3 weeks.  EXAM: CT ABDOMEN AND PELVIS WITHOUT CONTRAST  TECHNIQUE: Multidetector CT imaging of the abdomen and pelvis was performed following the standard protocol without IV contrast.  COMPARISON:  Prior CT from 01/08/2018.  FINDINGS: Lower chest: Visualized lung bases are clear. Small calcified lymph node noted associated with a fat containing hiatal hernia.  Hepatobiliary: Limited noncontrast evaluation of the liver is unremarkable. Gallbladder surgically absent. No biliary dilatation. Partially calcified porta hepatis node measuring 13 mm noted, similar to previous.  Pancreas: Irregular somewhat bilobed and partially calcified lesion at the pancreatic tail again seen, grossly similar to prior CT measuring up to approximately 2.8 cm, not well delineated on this noncontrast examination. Pancreatic duct mildly dilated up to 6 mm at the level of the mid pancreatic body. Remainder of the pancreas is normal in appearance.  Spleen: Spleen is normal.  Adrenals/Urinary Tract: Adrenal glands within normal limits. Kidneys equal in size without hydronephrosis. There are a few small punctate nonobstructive right renal nephrolithiasis involving the mid and upper pole. No radiopaque calculi seen along the course of either renal collecting system. No hydroureter. Partially distended bladder within normal limits. No layering stones within the bladder lumen.  Stomach/Bowel: Hiatal hernia containing mostly fat noted. Stomach within normal limits. No evidence for bowel obstruction. Colonic diverticulosis without overt evidence for acute diverticulitis. No acute inflammatory changes seen about the bowels.  Vascular/Lymphatic: Intra-abdominal aorta of normal caliber. Mild  aorto bi-iliac atherosclerotic disease. No other adenopathy within the abdomen and  pelvis.  Reproductive: Prostate mildly enlarged measuring 5.4 cm in diameter.  Other: Small bilateral fat containing inguinal hernias noted. No free air or fluid.  Musculoskeletal: No acute osseous abnormality. No discrete lytic or blastic osseous lesions. 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  IMPRESSION: 1. Punctate nonobstructive right renal nephrolithiasis. No left-sided renal calculi or evidence for obstructive uropathy. 2. Colonic diverticulosis without overt evidence for acute diverticulitis. 3. Grossly similar appearance of irregular partially calcified lesion at the pancreatic tail. Finding is indeterminate, and could reflect changes related to previous/chronic pancreatitis or possibly a pancreatic neoplasm. Again, follow-up examination with nonemergent abdominal MRI recommended for complete evaluation. 4. 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   Electronically Signed By: Rise Mu M.D. On: 08/27/2018 04:58   Assessment & Plan:    1. BPH with urinary obstruction We will trial uroxatral 10mg  qhs  2. Slowing, urinary stream ***  3. Erectile dysfunction -tadalafil 10mg  prn   No follow-ups on file.  Wilkie Aye, MD  [facility]

## 2023-01-04 LAB — MICROSCOPIC EXAMINATION: Bacteria, UA: NONE SEEN

## 2023-01-04 LAB — URINALYSIS, ROUTINE W REFLEX MICROSCOPIC
Bilirubin, UA: NEGATIVE
Ketones, UA: NEGATIVE
Leukocytes,UA: NEGATIVE
Nitrite, UA: NEGATIVE
RBC, UA: NEGATIVE
Specific Gravity, UA: 1.025 (ref 1.005–1.030)
Urobilinogen, Ur: 0.2 mg/dL (ref 0.2–1.0)
pH, UA: 6 (ref 5.0–7.5)

## 2023-01-05 LAB — TOXASSURE SELECT 13 (MW), URINE

## 2023-01-07 DIAGNOSIS — E1140 Type 2 diabetes mellitus with diabetic neuropathy, unspecified: Secondary | ICD-10-CM | POA: Diagnosis not present

## 2023-01-20 ENCOUNTER — Encounter: Payer: Self-pay | Admitting: "Endocrinology

## 2023-01-20 ENCOUNTER — Other Ambulatory Visit: Payer: Self-pay

## 2023-01-20 ENCOUNTER — Ambulatory Visit: Payer: Medicare Other | Admitting: "Endocrinology

## 2023-01-20 ENCOUNTER — Other Ambulatory Visit: Payer: Self-pay | Admitting: Internal Medicine

## 2023-01-20 DIAGNOSIS — E1140 Type 2 diabetes mellitus with diabetic neuropathy, unspecified: Secondary | ICD-10-CM

## 2023-01-20 MED ORDER — ONDANSETRON HCL 4 MG PO TABS: 4.0000 mg | ORAL_TABLET | Freq: Three times a day (TID) | ORAL | 0 refills | Status: AC | PRN

## 2023-01-20 MED ORDER — METFORMIN HCL ER 500 MG PO TB24
500.0000 mg | ORAL_TABLET | Freq: Every day | ORAL | 0 refills | Status: DC
Start: 2023-01-20 — End: 2023-03-28

## 2023-01-27 LAB — HM DIABETES EYE EXAM

## 2023-01-31 ENCOUNTER — Encounter: Payer: Self-pay | Admitting: Internal Medicine

## 2023-01-31 ENCOUNTER — Other Ambulatory Visit: Payer: Self-pay | Admitting: Internal Medicine

## 2023-01-31 DIAGNOSIS — F339 Major depressive disorder, recurrent, unspecified: Secondary | ICD-10-CM

## 2023-01-31 DIAGNOSIS — E1142 Type 2 diabetes mellitus with diabetic polyneuropathy: Secondary | ICD-10-CM

## 2023-01-31 MED ORDER — DULOXETINE HCL 60 MG PO CPEP: 60.0000 mg | ORAL_CAPSULE | Freq: Every day | ORAL | 3 refills | Status: DC

## 2023-02-06 DIAGNOSIS — E1140 Type 2 diabetes mellitus with diabetic neuropathy, unspecified: Secondary | ICD-10-CM | POA: Diagnosis not present

## 2023-02-06 NOTE — Progress Notes (Unsigned)
  Cardiology Office Note:   Date:  02/09/2023  ID:  [name], DOB 11-14-1960, MRN [id number] PCP: [name], MD  [facility] Providers Cardiologist:  Rollene Rotunda, MD    History of Present Illness:   [name] is a 62 y.o. male who presents for followup of his known coronary disease.  In 2016 he had chest pain but stress perfusion study demonstrated an EF of 60% with no ischemia.  He had an echo in Jan 2021.  He had a perfusion study IN jAN 2023 This was normal.   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  Although he does have a low iron diet and currently is not needing this..     He says he has been doing well.  He has been doing swimming and walking and biking at times.  If he does a lot of yard work he might get some chest discomfort.  He gets emotionally stressed he gets some chest discomfort.  However, he thinks this is the same or better than it was when he had his low risk stress perfusion study in 2023.  He has taken occasional nitroglycerin but he says this is a reduced number compared to what he was taking.  ROS: As stated in the HPI and negative for all other systems.   Studies Reviewed:    EKG: Sinus rhythm, rate 92, axis within normal limits, RSR prime V1 and V2, incomplete right bundle branch block, no acute ST-T wave changes.  Risk Assessment/Calculations:              Physical Exam:   VS:  BP 112/84   Pulse 92   Ht 5' 9.5" (1.765 m)   Wt 171 lb (77.6 kg)   BMI 24.89 kg/m    Wt Readings from Last 3 Encounters:  02/09/23 171 lb (77.6 kg)  12/30/22 180 lb 6.4 oz (81.8 kg)  10/25/22 175 lb 8 oz (79.6 kg)     GEN: Well nourished, well developed in no acute distress NECK: No JVD; No carotid bruits CARDIAC: RRR, no murmurs, rubs, gallops RESPIRATORY:  Clear to auscultation without rales, wheezing or rhonchi  ABDOMEN: Soft, non-tender, non-distended EXTREMITIES:  No edema; No deformity    ASSESSMENT AND PLAN:   CAD:  The patient has no new sypmtoms.  No further cardiovascular testing is indicated.  We will continue with aggressive risk reduction and meds as listed.  HTN:   The blood pressure is controlled.  No change in therapy.    HYPERLIPIDEMIA:     He has not tolerated PCSK9 inhibitors or statins.  He has significant hypertriglyceridemia.  We could not get him bempedoic acid.  He is taking Zetia.  He said he has dramatically changed his diet and he has lost about 9 pounds.  I am going to go ahead and check a lipid profile again in about 2 months if his triglycerides are still high he would agree to see Dr. Rennis Golden.    DM: His last A1C was 7.7.  This down from 8.6.  Plan per his primary provider     Follow up me in one year.   Signed, Rollene Rotunda, MD

## 2023-02-07 ENCOUNTER — Other Ambulatory Visit: Payer: Self-pay | Admitting: Internal Medicine

## 2023-02-07 ENCOUNTER — Encounter (INDEPENDENT_AMBULATORY_CARE_PROVIDER_SITE_OTHER): Payer: Self-pay | Admitting: Gastroenterology

## 2023-02-07 ENCOUNTER — Other Ambulatory Visit (INDEPENDENT_AMBULATORY_CARE_PROVIDER_SITE_OTHER): Payer: Self-pay

## 2023-02-07 ENCOUNTER — Encounter: Payer: Self-pay | Admitting: Internal Medicine

## 2023-02-07 DIAGNOSIS — E1142 Type 2 diabetes mellitus with diabetic polyneuropathy: Secondary | ICD-10-CM

## 2023-02-07 DIAGNOSIS — K219 Gastro-esophageal reflux disease without esophagitis: Secondary | ICD-10-CM

## 2023-02-07 DIAGNOSIS — K2270 Barrett's esophagus without dysplasia: Secondary | ICD-10-CM

## 2023-02-07 DIAGNOSIS — I10 Essential (primary) hypertension: Secondary | ICD-10-CM

## 2023-02-07 MED ORDER — LANSOPRAZOLE 30 MG PO CPDR
30.0000 mg | DELAYED_RELEASE_CAPSULE | Freq: Every day | ORAL | 3 refills | Status: DC
Start: 2023-02-07 — End: 2023-08-12

## 2023-02-07 NOTE — Telephone Encounter (Signed)
Rx sent to requested pharmacy

## 2023-02-08 MED ORDER — HYDROCODONE-ACETAMINOPHEN 10-325 MG PO TABS
1.0000 | ORAL_TABLET | Freq: Four times a day (QID) | ORAL | 0 refills | Status: DC | PRN
Start: 2023-02-08 — End: 2023-05-13

## 2023-02-09 ENCOUNTER — Ambulatory Visit: Payer: Medicare Other | Admitting: Cardiology

## 2023-02-09 ENCOUNTER — Encounter: Payer: Self-pay | Admitting: Cardiology

## 2023-02-09 ENCOUNTER — Other Ambulatory Visit: Payer: Self-pay | Admitting: *Deleted

## 2023-02-09 VITALS — BP 112/84 | HR 92 | Ht 69.5 in | Wt 171.0 lb

## 2023-02-09 DIAGNOSIS — I2510 Atherosclerotic heart disease of native coronary artery without angina pectoris: Secondary | ICD-10-CM | POA: Diagnosis not present

## 2023-02-09 DIAGNOSIS — Z79899 Other long term (current) drug therapy: Secondary | ICD-10-CM

## 2023-02-09 DIAGNOSIS — E785 Hyperlipidemia, unspecified: Secondary | ICD-10-CM

## 2023-02-09 DIAGNOSIS — I10 Essential (primary) hypertension: Secondary | ICD-10-CM

## 2023-02-09 MED ORDER — ICOSAPENT ETHYL 1 G PO CAPS
2.0000 g | ORAL_CAPSULE | Freq: Two times a day (BID) | ORAL | 11 refills | Status: DC
Start: 2023-02-09 — End: 2024-03-05

## 2023-02-09 NOTE — Addendum Note (Signed)
Addended by: Sharin Grave on: 02/09/2023 02:31 PM   Modules accepted: Orders

## 2023-02-09 NOTE — Patient Instructions (Signed)
Medication Instructions:  The current medical regimen is effective;  continue present plan and medications.  *If you need a refill on your cardiac medications before your next appointment, please call your pharmacy*   Lab Work: Please have lipid panel in 2 months at your closest [facility].  If you have labs (blood work) drawn today and your tests are completely normal, you will receive your results only by: MyChart Message (if you have MyChart) OR A paper copy in the mail If you have any lab test that is abnormal or we need to change your treatment, we will call you to review the results.  Follow-Up: At [facility], you and your health needs are our priority.  As part of our continuing mission to provide you with exceptional heart care, we have created designated Provider Care Teams.  These Care Teams include your primary Cardiologist (physician) and Advanced Practice Providers (APPs -  Physician Assistants and Nurse Practitioners) who all work together to provide you with the care you need, when you need it.  We recommend signing up for the patient portal called "MyChart".  Sign up information is provided on this After Visit Summary.  MyChart is used to connect with patients for Virtual Visits (Telemedicine).  Patients are able to view lab/test results, encounter notes, upcoming appointments, etc.  Non-urgent messages can be sent to your provider as well.   To learn more about what you can do with MyChart, go to [email or web address].    Your next appointment:   1 year(s)  Provider:   Rollene Rotunda, MD

## 2023-03-01 DIAGNOSIS — M75102 Unspecified rotator cuff tear or rupture of left shoulder, not specified as traumatic: Secondary | ICD-10-CM | POA: Diagnosis not present

## 2023-03-01 DIAGNOSIS — M19012 Primary osteoarthritis, left shoulder: Secondary | ICD-10-CM | POA: Diagnosis not present

## 2023-03-01 DIAGNOSIS — M25512 Pain in left shoulder: Secondary | ICD-10-CM | POA: Diagnosis not present

## 2023-03-03 DIAGNOSIS — D751 Secondary polycythemia: Secondary | ICD-10-CM | POA: Diagnosis not present

## 2023-03-03 DIAGNOSIS — D45 Polycythemia vera: Secondary | ICD-10-CM | POA: Diagnosis not present

## 2023-03-08 DIAGNOSIS — E1140 Type 2 diabetes mellitus with diabetic neuropathy, unspecified: Secondary | ICD-10-CM | POA: Diagnosis not present

## 2023-03-08 DIAGNOSIS — D751 Secondary polycythemia: Secondary | ICD-10-CM | POA: Diagnosis not present

## 2023-03-24 ENCOUNTER — Encounter: Payer: Self-pay | Admitting: Internal Medicine

## 2023-03-28 ENCOUNTER — Other Ambulatory Visit: Payer: Self-pay | Admitting: Internal Medicine

## 2023-03-28 DIAGNOSIS — E1140 Type 2 diabetes mellitus with diabetic neuropathy, unspecified: Secondary | ICD-10-CM

## 2023-03-28 MED ORDER — METFORMIN HCL ER 500 MG PO TB24
500.0000 mg | ORAL_TABLET | Freq: Every day | ORAL | 1 refills | Status: DC
Start: 2023-03-28 — End: 2023-06-20

## 2023-03-31 ENCOUNTER — Other Ambulatory Visit: Payer: Self-pay | Admitting: Internal Medicine

## 2023-03-31 DIAGNOSIS — E782 Mixed hyperlipidemia: Secondary | ICD-10-CM

## 2023-04-01 MED ORDER — EZETIMIBE 10 MG PO TABS
10.0000 mg | ORAL_TABLET | Freq: Every day | ORAL | 0 refills | Status: DC
Start: 2023-04-01 — End: 2023-07-05

## 2023-04-07 DIAGNOSIS — E1140 Type 2 diabetes mellitus with diabetic neuropathy, unspecified: Secondary | ICD-10-CM | POA: Diagnosis not present

## 2023-04-13 ENCOUNTER — Ambulatory Visit: Payer: Medicare Other | Admitting: Urology

## 2023-04-13 ENCOUNTER — Encounter: Payer: Self-pay | Admitting: Urology

## 2023-04-13 VITALS — BP 148/79 | HR 84

## 2023-04-13 DIAGNOSIS — N5201 Erectile dysfunction due to arterial insufficiency: Secondary | ICD-10-CM

## 2023-04-13 DIAGNOSIS — R351 Nocturia: Secondary | ICD-10-CM | POA: Diagnosis not present

## 2023-04-13 DIAGNOSIS — N401 Enlarged prostate with lower urinary tract symptoms: Secondary | ICD-10-CM

## 2023-04-13 DIAGNOSIS — N138 Other obstructive and reflux uropathy: Secondary | ICD-10-CM

## 2023-04-13 MED ORDER — TADALAFIL 5 MG PO TABS
5.0000 mg | ORAL_TABLET | Freq: Every day | ORAL | 11 refills | Status: DC | PRN
Start: 2023-04-13 — End: 2023-09-21

## 2023-04-13 NOTE — Patient Instructions (Signed)

## 2023-04-13 NOTE — Progress Notes (Signed)
04/13/2023 3:54 PM   [name] 1961-04-06 [id number]  Referring provider: [name], MD [address] [place],  [place] [address]  Followup BPH and erectile dysfunction   HPI: [name] is a 62yo here for followup for BPH and erectile dysfunction.He takes the uroxatral 10mg  intermittently. IPSS 17 QOl 3 on uroxatral 10mg . His urinary urgency is improved on uroxatral 10mg  at bedtime. He notes his urine stream is improved on uroxatral 10mg . He uses tadalafil prn with mixed results   PMH: Past Medical History:  Diagnosis Date   Anemia    Bypass graft stenosis (HCC)    '06   Cancer (HCC)    Polycythemia vera   Cervical spinal stenosis 05/06/2020   C5-6 level   Chronic gastritis    Coronary artery disease    Patent Stent to Circ.  Patent coronary bypass grafts with a free radial graft to PDA and LIMA to the  diag.   Diabetes mellitus    Diabetic peripheral neuropathy (HCC) 05/06/2020   Dyslipidemia    Poorly controlled, probably related to his DM (Some of this is secondary to his inability to afford medications).   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/2012   PONV (postoperative nausea and vomiting)     Surgical History: Past Surgical History:  Procedure Laterality Date   APPENDECTOMY     BIOPSY  11/11/2021   Procedure: BIOPSY;  Surgeon: Malissa Hippo, MD;  [facility];  Service: Endoscopy;;   CARDIAC CATHETERIZATION     CHOLECYSTECTOMY     COLONOSCOPY  07/31/1985   COLONOSCOPY N/A 03/11/2016   Procedure: COLONOSCOPY;  Surgeon: Malissa Hippo, MD;  [facility]: [facility];  Service: Endoscopy;  Laterality: N/A;   COLONOSCOPY WITH PROPOFOL N/A 11/11/2021   Procedure: COLONOSCOPY WITH PROPOFOL;  Surgeon: Malissa Hippo, MD;  [facility]: [facility];  Service: Endoscopy;  Laterality: N/A;  205   CORONARY  ANGIOPLASTY WITH STENT PLACEMENT  2006   CORONARY ARTERY BYPASS GRAFT  11/24/2004   ESOPHAGOGASTRODUODENOSCOPY  09/19/01   ESOPHAGOGASTRODUODENOSCOPY N/A 03/11/2016   Procedure: ESOPHAGOGASTRODUODENOSCOPY (EGD);  Surgeon: Malissa Hippo, MD;  [facility];  Service: Endoscopy;  Laterality: N/A;  12:00   ESOPHAGOGASTRODUODENOSCOPY (EGD) WITH PROPOFOL N/A 11/11/2021   Procedure: ESOPHAGOGASTRODUODENOSCOPY (EGD) WITH PROPOFOL;  Surgeon: Malissa Hippo, MD;  [facility];  Service: Endoscopy;  Laterality: N/A;   FINGER TENDON REPAIR     in middle finger   KNEE SURGERY     left knee   NASAL SEPTUM SURGERY     POLYPECTOMY  11/11/2021   Procedure: POLYPECTOMY;  Surgeon: Malissa Hippo, MD;  [facility]: [facility];  Service: Endoscopy;;    Home Medications:  Allergies as of 04/13/2023       Reactions   Iodinated Contrast Media Hives, Itching, Nausea Only   Flushing, Burning, itching Other reaction(s): Angioedema (ALLERGY/intolerance)   Iohexol Hives, Shortness Of Breath    Desc: HIVES,SOB NEEDS PREP.MEDS   Metoclopramide Hcl Hives   Patient became very spastic   Alfuzosin Hcl Er    Dropped blood pressure   Sulfonamide Derivatives Hives   Large hives   Enalapril Cough   Penicillins Rash   Has patient had a PCN reaction causing immediate rash, facial/tongue/throat swelling, SOB or lightheadedness with hypotension:  Yes Has patient had a PCN reaction causing severe rash involving mucus membranes or skin necrosis: No Has patient had a PCN reaction that required hospitalization No Has patient had a PCN reaction occurring within the last 10 years: No. If all of the above answers are "NO", then may proceed with Cephalosporin use.   Sulfa Antibiotics Rash   Other Reaction: Not Assessed        Medication List        Accurate as of April 13, 2023  3:54 PM. If you have any questions, ask your nurse or doctor.          alfuzosin 10 MG 24 hr tablet Commonly  known as: UROXATRAL Take 1 tablet (10 mg total) by mouth at bedtime.   Cholecalciferol 125 MCG (5000 UT) Tabs Take 10,000 Units by mouth daily.   clopidogrel 75 MG tablet Commonly known as: PLAVIX Take 1 tablet (75 mg total) by mouth daily.   Dexcom G7 Receiver Devi USE TO CHECK GLUCOSE AS NEEDED   Dexcom G7 Sensor Misc CHANGE SENSOR EVERY 10 DAYS   diazepam 10 MG tablet Commonly known as: VALIUM Take 0.5 tablets (5 mg total) by mouth at bedtime as needed for anxiety or sleep.   DULoxetine 60 MG capsule Commonly known as: CYMBALTA Take 1 capsule (60 mg total) by mouth daily.   ezetimibe 10 MG tablet Commonly known as: ZETIA Take 1 tablet (10 mg total) by mouth daily.   FREESTYLE TEST STRIPS test strip Generic drug: glucose blood Use as instructed   gabapentin 300 MG capsule Commonly known as: NEURONTIN TAKE (2) CAPSULES BY MOUTH (3) TIMES DAILY.   hydrochlorothiazide 25 MG tablet Commonly known as: HYDRODIURIL TAKE 1 TABLET BY MOUTH IN THE MORNING   HYDROcodone-acetaminophen 10-325 MG tablet Commonly known as: NORCO Take 1 tablet by mouth every 6 (six) hours as needed for severe pain.   icosapent Ethyl 1 g capsule Commonly known as: VASCEPA Take 2 capsules (2 g total) by mouth 2 (two) times daily.   insulin NPH-regular Human (70-30) 100 UNIT/ML injection Inject 60 Units into the skin 2 (two) times daily before a meal. When glucose is above 90 and eating   lansoprazole 30 MG capsule Commonly known as: PREVACID Take 1 capsule (30 mg total) by mouth daily at 12 noon.   losartan 50 MG tablet Commonly known as: COZAAR Take 1 tablet (50 mg total) by mouth daily. What changed:  how much to take when to take this   metFORMIN 500 MG 24 hr tablet Commonly known as: GLUCOPHAGE-XR Take 1 tablet (500 mg total) by mouth daily with breakfast.   methocarbamol 500 MG tablet Commonly known as: ROBAXIN TAKE 1 TABLET BY MOUTH EVERY 8 HOURS AS NEEDED FOR MUSCLE  SPASMS.   metoprolol tartrate 25 MG tablet Commonly known as: LOPRESSOR TAKE 1 TABLET BY MOUTH TWICE DAILY   nitroGLYCERIN 0.4 MG SL tablet Commonly known as: NITROSTAT Place 0.4 mg under the tongue every 5 (five) minutes as needed for chest pain. Reported on 02/26/2016   NONFORMULARY OR COMPOUNDED ITEM Apply 1 application. topically daily as needed (neuropathy). Compounded cream for foot pain. Gets at [employer].   ondansetron 4 MG tablet Commonly known as: ZOFRAN Take 1 tablet (4 mg total) by mouth every 8 (eight) hours as needed for nausea or vomiting.   polyethylene glycol 17 g packet Commonly known as: MIRALAX / GLYCOLAX Take 17 g by mouth daily as needed for moderate constipation.   tadalafil  20 MG tablet Commonly known as: CIALIS Take 1 tablet (20 mg total) by mouth daily as needed.        Allergies:  Allergies  Allergen Reactions   Iodinated Contrast Media Hives, Itching and Nausea Only    Flushing, Burning, itching Other reaction(s): Angioedema (ALLERGY/intolerance)    Iohexol Hives and Shortness Of Breath     Desc: HIVES,SOB NEEDS PREP.MEDS    Metoclopramide Hcl Hives    Patient became very spastic   Alfuzosin Hcl Er     Dropped blood pressure   Sulfonamide Derivatives Hives    Large hives   Enalapril Cough   Penicillins Rash    Has patient had a PCN reaction causing immediate rash, facial/tongue/throat swelling, SOB or lightheadedness with hypotension: Yes Has patient had a PCN reaction causing severe rash involving mucus membranes or skin necrosis: No Has patient had a PCN reaction that required hospitalization No Has patient had a PCN reaction occurring within the last 10 years: No. If all of the above answers are "NO", then may proceed with Cephalosporin use.    Sulfa Antibiotics Rash    Other Reaction: Not Assessed    Family History: Family History  Problem Relation Age of Onset   Coronary artery disease Mother        strong family history  of   Heart attack Mother        dying of (at age 21)   Diabetes Mother    Diabetes Sister    Diabetes Brother    Pancreatitis Brother    Healthy Daughter    Hyperlipidemia Daughter    Heart disease Brother    Hypertension Brother    Hyperlipidemia Brother     Social History:  reports that he has never smoked. He has never used smokeless tobacco. He reports that he does not currently use alcohol. He reports that he does not use drugs.  ROS: All other review of systems were reviewed and are negative except what is noted above in HPI  Physical Exam: BP (!) 148/79   Pulse 84   Constitutional:  Alert and oriented, No acute distress. HEENT: [place] AT, moist mucus membranes.  Trachea midline, no masses. Cardiovascular: No clubbing, cyanosis, or edema. Respiratory: Normal respiratory effort, no increased work of breathing. GI: Abdomen is soft, nontender, nondistended, no abdominal masses GU: No CVA tenderness.  Lymph: No cervical or inguinal lymphadenopathy. Skin: No rashes, bruises or suspicious lesions. Neurologic: Grossly intact, no focal deficits, moving all 4 extremities. Psychiatric: Normal mood and affect.  Laboratory Data: Lab Results  Component Value Date   WBC 5.9 12/30/2022   HGB 15.8 12/30/2022   HCT 47.5 12/30/2022   MCV 77 (L) 12/30/2022   PLT 194 12/30/2022    Lab Results  Component Value Date   CREATININE 1.43 (H) 12/30/2022    No results found for: "PSA"  No results found for: "TESTOSTERONE"  Lab Results  Component Value Date   HGBA1C 7.7 (H) 12/30/2022    Urinalysis    Component Value Date/Time   COLORURINE YELLOW 09/29/2021 1532   APPEARANCEUR Clear 01/03/2023 1429   LABSPEC 1.016 09/29/2021 1532   PHURINE 5.0 09/29/2021 1532   GLUCOSEU Trace (A) 01/03/2023 1429   HGBUR NEGATIVE 09/29/2021 1532   BILIRUBINUR Negative 01/03/2023 1429   KETONESUR NEGATIVE 09/29/2021 1532   PROTEINUR 2+ (A) 01/03/2023 1429   PROTEINUR NEGATIVE 09/29/2021 1532    UROBILINOGEN 0.2 03/28/2012 1042   NITRITE Negative 01/03/2023 1429   NITRITE NEGATIVE 09/29/2021  1532   LEUKOCYTESUR Negative 01/03/2023 1429   LEUKOCYTESUR NEGATIVE 09/29/2021 1532    Lab Results  Component Value Date   LABMICR See below: 01/03/2023   WBCUA 0-5 01/03/2023   LABEPIT 0-10 01/03/2023   MUCUS Present 05/14/2021   BACTERIA None seen 01/03/2023    Pertinent Imaging:  Results for orders placed during the hospital encounter of 03/15/06  DG Abd 1 View  Narrative Clinical Data: Low back pain, question kidney stone. ABDOMEN - 1 VIEW: Findings: No unexpected radiopaque densities are seen over the renal shadows or expected course of the ureters or urinary bladder to suggest kidney stones. Bowel gas pattern is unremarkable. There is no focal bony abnormality.  Impression Negative for evidence of urinary tract calculi.   Provider: Lauris Poag  No results found for this or any previous visit.  No results found for this or any previous visit.  No results found for this or any previous visit.  No results found for this or any previous visit.  No valid procedures specified. No results found for this or any previous visit.  Results for orders placed during the hospital encounter of 08/27/18  CT Renal Stone Study  Narrative CLINICAL DATA:  Initial evaluation for intermittent left-sided flank pain for 3 weeks.  EXAM: CT ABDOMEN AND PELVIS WITHOUT CONTRAST  TECHNIQUE: Multidetector CT imaging of the abdomen and pelvis was performed following the standard protocol without IV contrast.  COMPARISON:  Prior CT from 01/08/2018.  FINDINGS: Lower chest: Visualized lung bases are clear. Small calcified lymph node noted associated with a fat containing hiatal hernia.  Hepatobiliary: Limited noncontrast evaluation of the liver is unremarkable. Gallbladder surgically absent. No biliary dilatation. Partially calcified porta hepatis node measuring 13 mm  noted, similar to previous.  Pancreas: Irregular somewhat bilobed and partially calcified lesion at the pancreatic tail again seen, grossly similar to prior CT measuring up to approximately 2.8 cm, not well delineated on this noncontrast examination. Pancreatic duct mildly dilated up to 6 mm at the level of the mid pancreatic body. Remainder of the pancreas is normal in appearance.  Spleen: Spleen is normal.  Adrenals/Urinary Tract: Adrenal glands within normal limits. Kidneys equal in size without hydronephrosis. There are a few small punctate nonobstructive right renal nephrolithiasis involving the mid and upper pole. No radiopaque calculi seen along the course of either renal collecting system. No hydroureter. Partially distended bladder within normal limits. No layering stones within the bladder lumen.  Stomach/Bowel: Hiatal hernia containing mostly fat noted. Stomach within normal limits. No evidence for bowel obstruction. Colonic diverticulosis without overt evidence for acute diverticulitis. No acute inflammatory changes seen about the bowels.  Vascular/Lymphatic: Intra-abdominal aorta of normal caliber. Mild aorto bi-iliac atherosclerotic disease. No other adenopathy within the abdomen and pelvis.  Reproductive: Prostate mildly enlarged measuring 5.4 cm in diameter.  Other: Small bilateral fat containing inguinal hernias noted. No free air or fluid.  Musculoskeletal: No acute osseous abnormality. No discrete lytic or blastic osseous lesions. 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  IMPRESSION: 1. Punctate nonobstructive right renal nephrolithiasis. No left-sided renal calculi or evidence for obstructive uropathy. 2. Colonic diverticulosis without overt evidence for acute diverticulitis. 3. Grossly similar appearance of irregular partially calcified lesion at the pancreatic tail. Finding is indeterminate, and could reflect  changes related to previous/chronic pancreatitis or possibly a pancreatic neoplasm. Again, follow-up examination with nonemergent abdominal MRI recommended for complete evaluation. 4. Chronic bilateral pars defects at L5 with associated grade 1  spondylolisthesis.   Electronically Signed By: Rise Mu M.D. On: 08/27/2018 04:58   Assessment & Plan:    1. BPH with urinary obstruction Continue uroxatral 10mg  qhs - Urinalysis, Routine w reflex microscopic  2. Erectile dysfunction due to arterial insufficiency -tadalafil 5mg  daily and 10-20mg  prn  3. Nocturia -uroxatral 10mg  qhs   No follow-ups on file.  Wilkie Aye, MD  [facility] [place]

## 2023-04-14 LAB — URINALYSIS, ROUTINE W REFLEX MICROSCOPIC
Bilirubin, UA: NEGATIVE
Leukocytes,UA: NEGATIVE
Nitrite, UA: NEGATIVE
RBC, UA: NEGATIVE
Specific Gravity, UA: 1.02 (ref 1.005–1.030)
Urobilinogen, Ur: 1 mg/dL (ref 0.2–1.0)
pH, UA: 6.5 (ref 5.0–7.5)

## 2023-04-14 LAB — MICROSCOPIC EXAMINATION: Bacteria, UA: NONE SEEN

## 2023-05-07 DIAGNOSIS — E1140 Type 2 diabetes mellitus with diabetic neuropathy, unspecified: Secondary | ICD-10-CM | POA: Diagnosis not present

## 2023-05-13 ENCOUNTER — Other Ambulatory Visit: Payer: Self-pay | Admitting: Internal Medicine

## 2023-05-13 ENCOUNTER — Encounter: Payer: Self-pay | Admitting: Internal Medicine

## 2023-05-13 DIAGNOSIS — E1142 Type 2 diabetes mellitus with diabetic polyneuropathy: Secondary | ICD-10-CM

## 2023-05-13 DIAGNOSIS — M25512 Pain in left shoulder: Secondary | ICD-10-CM

## 2023-05-13 DIAGNOSIS — M25511 Pain in right shoulder: Secondary | ICD-10-CM

## 2023-05-13 MED ORDER — HYDROCODONE-ACETAMINOPHEN 10-325 MG PO TABS
1.0000 | ORAL_TABLET | Freq: Four times a day (QID) | ORAL | 0 refills | Status: DC | PRN
Start: 2023-05-13 — End: 2023-08-23

## 2023-05-13 MED ORDER — METHOCARBAMOL 500 MG PO TABS
500.0000 mg | ORAL_TABLET | Freq: Three times a day (TID) | ORAL | 0 refills | Status: DC | PRN
Start: 2023-05-13 — End: 2024-04-27

## 2023-05-22 ENCOUNTER — Other Ambulatory Visit: Payer: Self-pay | Admitting: Internal Medicine

## 2023-05-22 DIAGNOSIS — E1142 Type 2 diabetes mellitus with diabetic polyneuropathy: Secondary | ICD-10-CM

## 2023-05-22 DIAGNOSIS — F339 Major depressive disorder, recurrent, unspecified: Secondary | ICD-10-CM

## 2023-06-01 ENCOUNTER — Encounter: Payer: Self-pay | Admitting: Internal Medicine

## 2023-06-01 ENCOUNTER — Other Ambulatory Visit: Payer: Self-pay | Admitting: Internal Medicine

## 2023-06-01 DIAGNOSIS — E1142 Type 2 diabetes mellitus with diabetic polyneuropathy: Secondary | ICD-10-CM

## 2023-06-01 DIAGNOSIS — F339 Major depressive disorder, recurrent, unspecified: Secondary | ICD-10-CM

## 2023-06-01 MED ORDER — DULOXETINE HCL 60 MG PO CPEP
60.0000 mg | ORAL_CAPSULE | Freq: Every day | ORAL | 1 refills | Status: DC
Start: 2023-06-01 — End: 2023-11-21

## 2023-06-20 ENCOUNTER — Encounter: Payer: Self-pay | Admitting: Internal Medicine

## 2023-06-20 ENCOUNTER — Other Ambulatory Visit: Payer: Self-pay | Admitting: Internal Medicine

## 2023-06-20 DIAGNOSIS — E1140 Type 2 diabetes mellitus with diabetic neuropathy, unspecified: Secondary | ICD-10-CM

## 2023-06-20 MED ORDER — METFORMIN HCL ER 500 MG PO TB24: 500.0000 mg | ORAL_TABLET | Freq: Two times a day (BID) | ORAL | 1 refills | Status: DC

## 2023-06-23 ENCOUNTER — Telehealth: Payer: Self-pay | Admitting: Internal Medicine

## 2023-06-23 ENCOUNTER — Other Ambulatory Visit: Payer: Self-pay | Admitting: Internal Medicine

## 2023-06-23 DIAGNOSIS — D45 Polycythemia vera: Secondary | ICD-10-CM

## 2023-06-23 DIAGNOSIS — E1140 Type 2 diabetes mellitus with diabetic neuropathy, unspecified: Secondary | ICD-10-CM

## 2023-06-23 DIAGNOSIS — E1142 Type 2 diabetes mellitus with diabetic polyneuropathy: Secondary | ICD-10-CM

## 2023-06-23 NOTE — Telephone Encounter (Signed)
FYI, patient called and said his heart doctor has ordered labs, have provider look see what was ordered and if Dr Allena Katz needs to add to the lab work.

## 2023-06-27 NOTE — Telephone Encounter (Signed)
Mychart message sent.

## 2023-06-29 ENCOUNTER — Ambulatory Visit: Payer: Medicare Other | Admitting: Internal Medicine

## 2023-06-29 NOTE — Telephone Encounter (Signed)
Copied from CRM [id number]. Topic: Clinical - Medical Advice >> Jun 29, 2023  1:40 PM Heather B wrote: Reason for CRM: Patient calling because he has an appointment today at 2p and doesn't know if he should reschedule due to diarrhea. Would like to speak with nurse or Lawson Fiscal from Dr. Allena Katz.

## 2023-07-04 ENCOUNTER — Encounter: Payer: Self-pay | Admitting: Internal Medicine

## 2023-07-04 ENCOUNTER — Ambulatory Visit (INDEPENDENT_AMBULATORY_CARE_PROVIDER_SITE_OTHER): Payer: Medicare Other | Admitting: Internal Medicine

## 2023-07-04 VITALS — BP 127/87 | HR 96 | Ht 69.5 in | Wt 161.2 lb

## 2023-07-04 DIAGNOSIS — E1140 Type 2 diabetes mellitus with diabetic neuropathy, unspecified: Secondary | ICD-10-CM | POA: Diagnosis not present

## 2023-07-04 DIAGNOSIS — K861 Other chronic pancreatitis: Secondary | ICD-10-CM | POA: Diagnosis not present

## 2023-07-04 DIAGNOSIS — I7381 Erythromelalgia: Secondary | ICD-10-CM | POA: Diagnosis not present

## 2023-07-04 DIAGNOSIS — Z794 Long term (current) use of insulin: Secondary | ICD-10-CM

## 2023-07-04 DIAGNOSIS — J3089 Other allergic rhinitis: Secondary | ICD-10-CM

## 2023-07-04 DIAGNOSIS — F339 Major depressive disorder, recurrent, unspecified: Secondary | ICD-10-CM

## 2023-07-04 DIAGNOSIS — I10 Essential (primary) hypertension: Secondary | ICD-10-CM

## 2023-07-04 DIAGNOSIS — E782 Mixed hyperlipidemia: Secondary | ICD-10-CM | POA: Diagnosis not present

## 2023-07-04 DIAGNOSIS — N1831 Chronic kidney disease, stage 3a: Secondary | ICD-10-CM

## 2023-07-04 NOTE — Assessment & Plan Note (Signed)
Well-controlled, was on Paxil Since he has uncontrolled peripheral neuropathy, switched to Cymbalta 60 mg QD

## 2023-07-04 NOTE — Assessment & Plan Note (Addendum)
BP Readings from Last 1 Encounters:  07/04/23 127/87   Usually well-controlled with Metoprolol, Losartan and HCTZ Counseled for compliance with the medications Advised DASH diet and moderate exercise/walking, at least 150 mins/week

## 2023-07-04 NOTE — Progress Notes (Unsigned)
Established Patient Office Visit  Subjective:  Patient ID: [name], male    DOB: 08-Sep-1960  Age: 62 y.o. MRN: [id number]  CC:  Chief Complaint  Patient presents with   Diabetes    Follow up    Depression    Follow up   Cough    Cough with congestion and drainage     HPI [name] is a 62 y.o. male with past medical history of CAD status post CABG, hypertension, uncontrolled diabetes mellitus-on insulin, severe diabetic neuropathy, chronic pancreatitis, polycythemia vera, hyperlipidemia, cervical spinal stenosis and chronic back pain who presents for f/u of his chronic medical conditions.  HTN: BP is wnl today. Takes medications regularly. Patient denies headache, dizziness, chest pain, dyspnea or palpitations currently.   DM: HbA1C is 8.8 now. He used to see Dr Fransico Him and his insulin dose was increased to 60 U BID in the last visit.  He reports that he takes lesser dose in the evening at times if blood glucose is close to 150.  He also takes Metformin 500 mg twice daily. Denies any polyuria or polyphagia currently.  He has CGM device, and his blood glucose runs around 150-200 most of the time, except post-meal readings.  Blood glucose averages are as below: 7-day: 200 30-day: 221 90-day: 217   Diabetic neuropathy: Takes gabapentin and as needed Norco for severe pain.  He has tried gabapentin cream as well for neuropathy, but continues to have severe burning pain in his feet.   MDD: He takes Cymbalta for it.  Currently denies any apathy, SI or HI currently.  He was switched to Cymbalta from Paxil for better neuropathic pain coverage in the last visit.  He reports chronic insomnia, which he attributes to neuropathic pain.   He also reports chronic tinnitus.  Denies any ear pain or discharge.  He has hearing aid, and he reports that it helps with tinnitus at times when he uses hearing aid.   He complains of nasal congestion, postnasal drip and dry cough for the last 3  days. Denies fever or chills.  He has tried taking OTC antihistaminic with relief.  Denies any dyspnea or wheezing currently.  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  Patent coronary bypass grafts with a free radial graft to PDA and LIMA to the  diag.   Diabetes mellitus    Diabetic peripheral neuropathy (HCC) 05/06/2020   Dyslipidemia    Poorly controlled, probably related to his DM (Some of this is secondary to his inability to afford medications).   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/2012   PONV (postoperative nausea and vomiting)     Past Surgical History:  Procedure Laterality Date   APPENDECTOMY     BIOPSY  11/11/2021   Procedure: BIOPSY;  Surgeon: Malissa Hippo, MD;  [facility]: [facility];  Service: Endoscopy;;   CARDIAC CATHETERIZATION     CHOLECYSTECTOMY     COLONOSCOPY  07/31/1985   COLONOSCOPY N/A 03/11/2016   Procedure: COLONOSCOPY;  Surgeon: Malissa Hippo, MD;  Location: [facility];  Service: Endoscopy;  Laterality: N/A;   COLONOSCOPY WITH PROPOFOL N/A 11/11/2021   Procedure: COLONOSCOPY WITH PROPOFOL;  Surgeon: Malissa Hippo, MD;  Location: [facility];  Service: Endoscopy;  Laterality: N/A;  205   CORONARY ANGIOPLASTY WITH STENT PLACEMENT  2006   CORONARY ARTERY BYPASS GRAFT  11/24/2004   ESOPHAGOGASTRODUODENOSCOPY  09/19/01   ESOPHAGOGASTRODUODENOSCOPY N/A 03/11/2016   Procedure: ESOPHAGOGASTRODUODENOSCOPY (EGD);  Surgeon: Malissa Hippo, MD;  [facility]: [facility];  Service: Endoscopy;  Laterality: N/A;  12:00   ESOPHAGOGASTRODUODENOSCOPY (EGD) WITH PROPOFOL N/A 11/11/2021   Procedure:  ESOPHAGOGASTRODUODENOSCOPY (EGD) WITH PROPOFOL;  Surgeon: Malissa Hippo, MD;  [facility]: [facility];  Service: Endoscopy;  Laterality: N/A;   FINGER TENDON REPAIR     in middle finger   KNEE SURGERY     left knee   NASAL SEPTUM SURGERY     POLYPECTOMY  11/11/2021   Procedure: POLYPECTOMY;  Surgeon: Malissa Hippo, MD;  Location: [facility];  Service: Endoscopy;;    Family History  Problem Relation Age of Onset   Coronary artery disease Mother        strong family history of   Heart attack Mother        dying of (at age 3)   Diabetes Mother    Diabetes Sister    Diabetes Brother    Pancreatitis Brother    Healthy Daughter    Hyperlipidemia Daughter    Heart disease Brother    Hypertension Brother    Hyperlipidemia Brother     Social History   Socioeconomic History   Marital status: Married    Spouse name: Not on file   Number of children: Not on file   Years of education: Not on file   Highest education level: Not on file  Occupational History   Not on file  Tobacco Use   Smoking status: Never   Smokeless tobacco: Never  Vaping Use   Vaping status: Never Used  Substance and Sexual Activity   Alcohol use: Not Currently    Comment: Patient may drink a beer or glass of wine a couple times a month   Drug use: No   Sexual activity: Not on file  Other Topics Concern   Not on file  Social History Narrative   The patient lives in [place] with his wife.  He is     disabled secondary to coronary artery disease and pancreatitis.  He does     not smoke, he does not drink alcohol.  There is no drug use.    Social Determinants of Health   Financial Resource Strain: Low Risk  (11/02/2021)   Overall Financial Resource Strain (CARDIA)    Difficulty of Paying Living Expenses: Not hard at all  Food Insecurity: No Food Insecurity (11/02/2021)   Hunger Vital Sign    Worried About Running Out of Food in the Last Year: Never true    Ran Out of Food in the Last Year:  Never true  Transportation Needs: No Transportation Needs (11/02/2021)   PRAPARE - Administrator, Civil Service (Medical): No    Lack of Transportation (Non-Medical): No  Physical Activity: Inactive (11/02/2021)   Exercise Vital Sign    Days of Exercise per Week: 0 days    Minutes of Exercise per Session: 0 min  Stress: No Stress Concern Present (11/02/2021)   [employer] of Occupational Health - Occupational Stress Questionnaire    Feeling  of Stress : Not at all  Social Connections: Unknown (01/04/2022)   Received from [facility], [facility]   Social Network    Social Network: Not on file  Intimate Partner Violence: Unknown (11/26/2021)   Received from [facility], [facility]   HITS    Physically Hurt: Not on file    Insult or Talk Down To: Not on file    Threaten Physical Harm: Not on file    Scream or Curse: Not on file    Outpatient Medications Prior to Visit  Medication Sig Dispense Refill   alfuzosin (UROXATRAL) 10 MG 24 hr tablet Take 1 tablet (10 mg total) by mouth at bedtime. 30 tablet 11   Cholecalciferol 125 MCG (5000 UT) TABS Take 10,000 Units by mouth daily.     clopidogrel (PLAVIX) 75 MG tablet Take 1 tablet (75 mg total) by mouth daily. 90 tablet 3   Continuous Blood Gluc Receiver (DEXCOM G7 RECEIVER) DEVI USE TO CHECK GLUCOSE AS NEEDED 1 each 0   Continuous Blood Gluc Sensor (DEXCOM G7 SENSOR) MISC CHANGE SENSOR EVERY 10 DAYS 3 each 2   diazepam (VALIUM) 10 MG tablet Take 0.5 tablets (5 mg total) by mouth at bedtime as needed for anxiety or sleep. 30 tablet 0   DULoxetine (CYMBALTA) 60 MG capsule Take 1 capsule (60 mg total) by mouth daily. 90 capsule 1   gabapentin (NEURONTIN) 300 MG capsule TAKE (2) CAPSULES BY MOUTH (3) TIMES DAILY. 540 capsule 0   glucose blood (FREESTYLE TEST STRIPS) test strip Use as instructed 100 each 12   hydrochlorothiazide (HYDRODIURIL) 25 MG tablet TAKE 1 TABLET BY MOUTH IN THE MORNING 90 tablet 1    HYDROcodone-acetaminophen (NORCO) 10-325 MG tablet Take 1 tablet by mouth every 6 (six) hours as needed for severe pain. 120 tablet 0   icosapent Ethyl (VASCEPA) 1 g capsule Take 2 capsules (2 g total) by mouth 2 (two) times daily. 120 capsule 11   insulin NPH-regular Human (NOVOLIN 70/30) (70-30) 100 UNIT/ML injection Inject 60 Units into the skin 2 (two) times daily before a meal. When glucose is above 90 and eating     lansoprazole (PREVACID) 30 MG capsule Take 1 capsule (30 mg total) by mouth daily at 12 noon. 90 capsule 3   losartan (COZAAR) 50 MG tablet Take 1 tablet (50 mg total) by mouth daily. (Patient taking differently: Take 25 mg by mouth at bedtime.) 90 tablet 1   metFORMIN (GLUCOPHAGE-XR) 500 MG 24 hr tablet Take 1 tablet (500 mg total) by mouth 2 (two) times daily with a meal. 180 tablet 1   methocarbamol (ROBAXIN) 500 MG tablet Take 1 tablet (500 mg total) by mouth every 8 (eight) hours as needed for muscle spasms. 30 tablet 0   metoprolol tartrate (LOPRESSOR) 25 MG tablet TAKE 1 TABLET BY MOUTH TWICE DAILY 180 tablet 1   nitroGLYCERIN (NITROSTAT) 0.4 MG SL tablet Place 0.4 mg under the tongue every 5 (five) minutes as needed for chest pain. Reported on 02/26/2016     NONFORMULARY OR COMPOUNDED ITEM Apply 1 application. topically daily as needed (neuropathy). Compounded cream for foot pain. Gets at [facility].     ondansetron (ZOFRAN) 4 MG tablet Take 1 tablet (4 mg total) by mouth every 8 (eight) hours as needed for nausea or vomiting. 30 tablet 0   polyethylene glycol (MIRALAX / GLYCOLAX) 17 g packet Take 17 g by mouth daily as needed for moderate constipation.     tadalafil (CIALIS)  20 MG tablet Take 1 tablet (20 mg total) by mouth daily as needed. 30 tablet 5   tadalafil (CIALIS) 5 MG tablet Take 1 tablet (5 mg total) by mouth daily as needed for erectile dysfunction. 30 tablet 11   ezetimibe (ZETIA) 10 MG tablet Take 1 tablet (10 mg total) by mouth daily. 90 tablet 0   No  facility-administered medications prior to visit.    Allergies  Allergen Reactions   Iodinated Contrast Media Hives, Itching and Nausea Only    Flushing, Burning, itching Other reaction(s): Angioedema (ALLERGY/intolerance)    Iohexol Hives and Shortness Of Breath     Desc: HIVES,SOB NEEDS PREP.MEDS    Metoclopramide Hcl Hives    Patient became very spastic   Alfuzosin Hcl Er     Dropped blood pressure   Sulfonamide Derivatives Hives    Large hives   Enalapril Cough   Penicillins Rash    Has patient had a PCN reaction causing immediate rash, facial/tongue/throat swelling, SOB or lightheadedness with hypotension: Yes Has patient had a PCN reaction causing severe rash involving mucus membranes or skin necrosis: No Has patient had a PCN reaction that required hospitalization No Has patient had a PCN reaction occurring within the last 10 years: No. If all of the above answers are "NO", then may proceed with Cephalosporin use.    Sulfa Antibiotics Rash    Other Reaction: Not Assessed    ROS Review of Systems  Constitutional:  Negative for chills and fever.  HENT:  Positive for congestion, postnasal drip and tinnitus.        Hearing difficulty  Eyes:  Negative for pain and discharge.  Respiratory:  Positive for cough. Negative for shortness of breath.   Cardiovascular:  Negative for palpitations.  Gastrointestinal:  Negative for constipation, diarrhea, nausea and vomiting.  Endocrine: Negative for polydipsia and polyuria.  Genitourinary:  Positive for difficulty urinating. Negative for hematuria.  Musculoskeletal:  Positive for arthralgias, back pain and neck pain. Negative for neck stiffness.  Skin:  Negative for rash.  Neurological:  Positive for numbness (B/l LE, intermittent). Negative for dizziness, weakness and headaches.  Psychiatric/Behavioral:  Negative for agitation and behavioral problems.       Objective:    Physical Exam Vitals reviewed.  Constitutional:       General: He is not in acute distress.    Appearance: He is not diaphoretic.  HENT:     Head: Normocephalic and atraumatic.     Nose: Congestion present.     Mouth/Throat:     Mouth: Mucous membranes are moist.  Eyes:     General: No scleral icterus.    Extraocular Movements: Extraocular movements intact.  Cardiovascular:     Rate and Rhythm: Normal rate and regular rhythm.     Heart sounds: Normal heart sounds. No murmur heard. Pulmonary:     Breath sounds: Normal breath sounds. No wheezing or rales.  Musculoskeletal:     Cervical back: Neck supple. No tenderness.     Right lower leg: No edema.     Left lower leg: No edema.  Skin:    General: Skin is warm.     Findings: No rash.  Neurological:     General: No focal deficit present.     Mental Status: He is alert and oriented to person, place, and time.     Cranial Nerves: No cranial nerve deficit.     Sensory: Sensory deficit (B/l feet) present.     Motor: No weakness.  Psychiatric:        Mood and Affect: Mood normal.        Behavior: Behavior normal.     BP 127/87 (BP Location: Left Arm, Patient Position: Sitting, Cuff Size: Normal)   Pulse 96   Ht 5' 9.5" (1.765 m)   Wt 161 lb 3.2 oz (73.1 kg)   SpO2 97%   BMI 23.46 kg/m  Wt Readings from Last 3 Encounters:  07/04/23 161 lb 3.2 oz (73.1 kg)  02/09/23 171 lb (77.6 kg)  12/30/22 180 lb 6.4 oz (81.8 kg)    Lab Results  Component Value Date   TSH 3.110 12/30/2022   Lab Results  Component Value Date   WBC 5.9 12/30/2022   HGB 15.8 12/30/2022   HCT 47.5 12/30/2022   MCV 77 (L) 12/30/2022   PLT 194 12/30/2022   Lab Results  Component Value Date   NA 138 07/04/2023   K 4.4 07/04/2023   CO2 24 07/04/2023   GLUCOSE 200 (H) 07/04/2023   BUN 24 07/04/2023   CREATININE 1.36 (H) 07/04/2023   BILITOT 0.9 07/04/2023   ALKPHOS 71 07/04/2023   AST 29 07/04/2023   ALT 19 07/04/2023   PROT 7.3 07/04/2023   ALBUMIN 4.5 07/04/2023   CALCIUM 9.8 07/04/2023    ANIONGAP 13 11/06/2021   EGFR 60 07/04/2023   Lab Results  Component Value Date   CHOL 261 (H) 07/04/2023   Lab Results  Component Value Date   HDL 45 07/04/2023   Lab Results  Component Value Date   LDLCALC 142 (H) 07/04/2023   Lab Results  Component Value Date   TRIG 405 (H) 07/04/2023   Lab Results  Component Value Date   CHOLHDL 5.8 (H) 07/04/2023   Lab Results  Component Value Date   HGBA1C 8.8 (H) 07/04/2023      Assessment & Plan:   Problem List Items Addressed This Visit       Cardiovascular and Mediastinum   Essential hypertension, benign    BP Readings from Last 1 Encounters:  07/04/23 127/87   Usually well-controlled with Metoprolol, Losartan and HCTZ Counseled for compliance with the medications Advised DASH diet and moderate exercise/walking, at least 150 mins/week      Erythromelalgia (HCC)    JAK2 negative Gets venipuncture every 3-4 months F/u Heme/Onc.        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 Was on pancrelipase Follows up with GI        Endocrine   Type 2 diabetes mellitus with diabetic neuropathy, with long-term current use of insulin (HCC) - Primary    Lab Results  Component Value Date   HGBA1C 8.8 (H) 07/04/2023   Uncontrolled On Novolin 60 U BID and Metformin 500 mg BID, used to f/u with Dr Fransico Him Needs to take Novolin regularly instead if taking lesser doses If persistent, will need to switch to [place] or [place] with ISS Advised to follow diabetic diet -has joined diabetes reversal group Not on any statin due to statin intolerance Diabetic eye exam: Advised to follow up with Ophthalmology for  diabetic eye exam      Relevant Orders   CMP14+EGFR (  Completed)   Hemoglobin A1c (Completed)     Genitourinary   Stage 3a chronic kidney disease (HCC)    Last BMP reviewed, stable GFR around 50 Advised to maintain adequate hydration On losartan Avoid nephrotoxic agents        Other   Mixed hyperlipidemia    Continue Zetia and Vascepa Intolerant to statin and Repatha  May try Praluent, discuss with Cardiology      Relevant Orders   Lipid Profile (Completed)   Depression, recurrent (HCC)    Well-controlled, was on Paxil Since he has uncontrolled peripheral neuropathy, switched to Cymbalta 60 mg QD       No orders of the defined types were placed in this encounter.   Follow-up: Return in about 4 months (around 11/01/2023).    [name], MD

## 2023-07-04 NOTE — Patient Instructions (Signed)
Please continue to take medications as prescribed. ? ?Please continue to follow low carb diet and perform moderate exercise/walking at least 150 mins/week. ?

## 2023-07-04 NOTE — Assessment & Plan Note (Signed)
JAK2 negative Gets venipuncture every 3-4 months F/u Heme/Onc.

## 2023-07-04 NOTE — Assessment & Plan Note (Signed)
Last BMP reviewed, stable GFR around 50 Advised to maintain adequate hydration On losartan Avoid nephrotoxic agents

## 2023-07-04 NOTE — Assessment & Plan Note (Signed)
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

## 2023-07-04 NOTE — Assessment & Plan Note (Addendum)
Lab Results  Component Value Date   HGBA1C 8.8 (H) 07/04/2023   Uncontrolled On Novolin 60 U BID and Metformin 500 mg BID, used to f/u with Dr Fransico Him Needs to take Novolin regularly instead if taking lesser doses If persistent, will need to switch to [place] or [place] with ISS Advised to follow diabetic diet -has joined diabetes reversal group Not on any statin due to statin intolerance Diabetic eye exam: Advised to follow up with Ophthalmology for diabetic eye exam

## 2023-07-05 ENCOUNTER — Other Ambulatory Visit: Payer: Self-pay | Admitting: Internal Medicine

## 2023-07-05 DIAGNOSIS — E782 Mixed hyperlipidemia: Secondary | ICD-10-CM

## 2023-07-05 LAB — CMP14+EGFR
ALT: 19 [IU]/L (ref 0–44)
AST: 29 [IU]/L (ref 0–40)
Albumin: 4.5 g/dL (ref 3.8–4.9)
Alkaline Phosphatase: 71 [IU]/L (ref 44–121)
BUN/Creatinine Ratio: 18 (ref 10–24)
BUN: 24 mg/dL (ref 8–27)
Bilirubin Total: 0.9 mg/dL (ref 0.0–1.2)
CO2: 24 mmol/L (ref 20–29)
Calcium: 9.8 mg/dL (ref 8.6–10.2)
Chloride: 93 mmol/L — ABNORMAL LOW (ref 96–106)
Creatinine, Ser: 1.36 mg/dL — ABNORMAL HIGH (ref 0.76–1.27)
Globulin, Total: 2.8 g/dL (ref 1.5–4.5)
Glucose: 200 mg/dL — ABNORMAL HIGH (ref 70–99)
Potassium: 4.4 mmol/L (ref 3.5–5.2)
Sodium: 138 mmol/L (ref 134–144)
Total Protein: 7.3 g/dL (ref 6.0–8.5)
eGFR: 60 mL/min/{1.73_m2} (ref >59)

## 2023-07-05 LAB — LIPID PANEL
Chol/HDL Ratio: 5.8 ratio — ABNORMAL HIGH (ref 0.0–5.0)
Cholesterol, Total: 261 mg/dL — ABNORMAL HIGH (ref 100–199)
HDL: 45 mg/dL (ref >39)
LDL Chol Calc (NIH): 142 mg/dL — ABNORMAL HIGH (ref 0–99)
Triglycerides: 405 mg/dL — ABNORMAL HIGH (ref 0–149)
VLDL Cholesterol Cal: 74 mg/dL — ABNORMAL HIGH (ref 5–40)

## 2023-07-05 LAB — HEMOGLOBIN A1C
Est. average glucose Bld gHb Est-mCnc: 206 mg/dL
Hgb A1c MFr Bld: 8.8 % — ABNORMAL HIGH (ref 4.8–5.6)

## 2023-07-05 MED ORDER — EZETIMIBE 10 MG PO TABS: 10.0000 mg | ORAL_TABLET | Freq: Every day | ORAL | 0 refills | Status: DC

## 2023-07-06 ENCOUNTER — Encounter: Payer: Self-pay | Admitting: Internal Medicine

## 2023-07-06 DIAGNOSIS — J309 Allergic rhinitis, unspecified: Secondary | ICD-10-CM | POA: Insufficient documentation

## 2023-07-06 NOTE — Assessment & Plan Note (Signed)
Continue Zetia and Vascepa Intolerant to statin and Repatha  May try Praluent, discuss with Cardiology 

## 2023-07-06 NOTE — Assessment & Plan Note (Addendum)
History current symptoms are likely due to allergic rhinitis Continue Zyrtec or Xyzal Flonase for nasal congestion Advised to use humidifier and/or vaporizer for nasal congestion

## 2023-07-07 ENCOUNTER — Encounter: Payer: Self-pay | Admitting: Cardiology

## 2023-07-07 DIAGNOSIS — E785 Hyperlipidemia, unspecified: Secondary | ICD-10-CM

## 2023-07-14 ENCOUNTER — Encounter: Payer: Self-pay | Admitting: Urology

## 2023-07-15 ENCOUNTER — Ambulatory Visit: Payer: Medicare Other | Admitting: Urology

## 2023-07-15 NOTE — Telephone Encounter (Signed)
See below. Can you please reschedule today's appointment.

## 2023-07-15 NOTE — Addendum Note (Signed)
Addended by: Freddi Starr on: 07/15/2023 05:00 PM   Modules accepted: Orders

## 2023-08-01 ENCOUNTER — Encounter: Payer: Self-pay | Admitting: Urology

## 2023-08-05 DIAGNOSIS — E1140 Type 2 diabetes mellitus with diabetic neuropathy, unspecified: Secondary | ICD-10-CM | POA: Diagnosis not present

## 2023-08-09 ENCOUNTER — Other Ambulatory Visit: Payer: Self-pay | Admitting: Internal Medicine

## 2023-08-09 DIAGNOSIS — I10 Essential (primary) hypertension: Secondary | ICD-10-CM

## 2023-08-11 ENCOUNTER — Encounter: Payer: Self-pay | Admitting: Urology

## 2023-08-11 ENCOUNTER — Encounter (INDEPENDENT_AMBULATORY_CARE_PROVIDER_SITE_OTHER): Payer: Self-pay | Admitting: Gastroenterology

## 2023-08-11 NOTE — Telephone Encounter (Signed)
Called patient and he states he is having pain like he has an ulcer. States very uncomfortable. Took med for pain and did not relieve the pain. He was requesting an appt. Appt made for tomorrow and pt advised to go to ED if pain gets worse or if  severe.

## 2023-08-11 NOTE — Telephone Encounter (Signed)
Left message to return call 

## 2023-08-11 NOTE — Progress Notes (Addendum)
Referring Provider: [name], MD Primary Care Physician:  [name], MD Primary GI Physician: Dr. Levon Hedger  Chief Complaint  Patient presents with   Abdominal Pain    Mid abdominal pain.     HPI:   [name] is a 62 y.o. 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   Last seen in the office 10/25/22.  He was taking Prevacid 30 mg daily usually when eating breakfast, but sometimes 30 to 60 minutes before.  No heartburn, odynophagia/dysphagia when taking medication compliantly.  Had also been taking domperidone once a day for gastroparesis but stopped a couple weeks prior due to having 4-5 bowel movements a day.  Couple days ago had some nausea and vomited and thought this was secondary to some urinary issues.  He was scheduled for repeat upper endoscopy due to history of Barrett's esophagus, advised to restart domperidone if he has recurrent nausea/vomiting/early satiety, strict control of triglycerides per cardiology, increase physical activity, continue Prevacid daily.  Patient was scheduled for an EGD on 12/01/2022, but canceled.   Today: Was having epigastric abdominal pain radiating to his back starting about 5 days ago.  Today he is actually feeling better.  Pain is about a 3 out of 10 in severity at this time.  He was able to eat a small chicken biscuit this morning without any issues.  He had some mild nausea associated with his pain, but no vomiting.  Seems like the pain eased off last night and this morning.  States it felt exactly like it had in the past when he had an ulcer.  Has been  taking Prevacid daily.  Has not had any heartburn or reflux. He has not been taking domperidone but did take 1 last night and felt like it gave him some relief.   Had been drinking an extra cup of coffee recently. Otherwise, no recent dietary changes.  No new medications.  Denies NSAIDs.  No brbpr or melena. Yesterday had a lot of gas and had some diarrhea. No diarrhea prior and none this morning. Takes MiraLAX every now and then for constipation. Usually has bowel movements daily.  Gets some crampy lower abdominal pain that improves once he is able to have good bowel movements.   Triglycerides bumped up to 2960 in May 2024.  Patient states this is because he had eaten twice today that he had his labs drawn.  Most recently, triglycerides 405 in November 2024.    Last EGD: 10/2021 Changes suggestive of Barrett's esophagus - unclear if short or long segment as it states it was short segment but report says 7 cm (possible typo?). Normal stomach and duodenum  Last Colonoscopy: 10/2021 Diverticulosis Small rectosigmoid polyp (Path TA)   Repeat in 5 years.   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  Patent coronary bypass grafts with a free radial graft to PDA and LIMA to the  diag.   Diabetes mellitus    Diabetic peripheral neuropathy (HCC) 05/06/2020   Dyslipidemia    Poorly controlled, probably related to his DM (Some of this is secondary to his inability to afford medications).   ED (erectile dysfunction)    Esophageal yeast infection (HCC)    Gastroparesis    Hypertension    Metaplasia of esophagus    Pancreatitis chronic    Pseudocyst   Polycythemia vera(238.4) 04/26/2012   PONV (postoperative nausea and vomiting)     Past Surgical History:  Procedure Laterality Date    APPENDECTOMY     BIOPSY  11/11/2021   Procedure: BIOPSY;  Surgeon: Malissa Hippo, MD;  [facility];  Service: Endoscopy;;   CARDIAC CATHETERIZATION     CHOLECYSTECTOMY     COLONOSCOPY  07/31/1985   COLONOSCOPY N/A 03/11/2016   Procedure: COLONOSCOPY;  Surgeon: Malissa Hippo, MD;  [facility]: [facility];  Service: Endoscopy;  Laterality: N/A;   COLONOSCOPY WITH PROPOFOL N/A 11/11/2021   Procedure: COLONOSCOPY WITH PROPOFOL;  Surgeon: Malissa Hippo, MD;  [facility]: [facility];  Service: Endoscopy;  Laterality: N/A;  205   CORONARY ANGIOPLASTY WITH STENT PLACEMENT  2006   CORONARY ARTERY BYPASS GRAFT  11/24/2004   ESOPHAGOGASTRODUODENOSCOPY  09/19/01   ESOPHAGOGASTRODUODENOSCOPY N/A 03/11/2016   Procedure: ESOPHAGOGASTRODUODENOSCOPY (EGD);  Surgeon: Malissa Hippo, MD;  [facility];  Service: Endoscopy;  Laterality: N/A;  12:00   ESOPHAGOGASTRODUODENOSCOPY (EGD) WITH PROPOFOL N/A 11/11/2021   Procedure: ESOPHAGOGASTRODUODENOSCOPY (EGD) WITH PROPOFOL;  Surgeon: Malissa Hippo, MD;  [facility];  Service: Endoscopy;  Laterality: N/A;   FINGER TENDON REPAIR     in middle finger   KNEE SURGERY     left knee   NASAL SEPTUM SURGERY     POLYPECTOMY  11/11/2021   Procedure: POLYPECTOMY;  Surgeon: Malissa Hippo, MD;  [facility];  Service: Endoscopy;;    Current Outpatient Medications  Medication Sig Dispense Refill   alfuzosin (UROXATRAL) 10 MG 24 hr tablet Take 1 tablet (10 mg total) by mouth at bedtime. 30 tablet 11   Cholecalciferol 125 MCG (5000 UT) TABS Take 10,000 Units by mouth daily.     clopidogrel (PLAVIX) 75 MG tablet Take 1 tablet (75 mg total) by mouth daily. 90 tablet 3   Continuous Blood Gluc Receiver (DEXCOM G7 RECEIVER) DEVI USE TO CHECK GLUCOSE AS NEEDED 1 each 0   Continuous Blood Gluc Sensor (DEXCOM G7 SENSOR) MISC CHANGE SENSOR EVERY 10 DAYS 3 each 2   diazepam (VALIUM) 10 MG tablet Take 0.5 tablets (5 mg total)  by mouth at bedtime as needed for anxiety or sleep. 30 tablet 0   DULoxetine (CYMBALTA) 60 MG capsule Take 1 capsule (60 mg total) by mouth daily. 90 capsule 1   ezetimibe (ZETIA) 10 MG tablet Take 1 tablet (10 mg total) by mouth daily. 90 tablet 0   gabapentin (NEURONTIN) 300 MG capsule TAKE (2) CAPSULES BY MOUTH (3) TIMES DAILY. 540 capsule 0   hydrochlorothiazide (HYDRODIURIL) 25 MG tablet TAKE 1 TABLET BY MOUTH IN THE MORNING 90 tablet 1   HYDROcodone-acetaminophen (NORCO) 10-325 MG tablet Take 1 tablet by mouth every 6 (six) hours as needed for severe pain. 120 tablet 0   icosapent Ethyl (VASCEPA) 1 g capsule Take 2 capsules (2 g total) by mouth 2 (two) times daily. 120 capsule 11   insulin NPH-regular Human (NOVOLIN 70/30) (70-30) 100 UNIT/ML injection Inject 60 Units  into the skin 2 (two) times daily before a meal. When glucose is above 90 and eating     lansoprazole (PREVACID) 30 MG capsule Take 1 capsule (30 mg total) by mouth 2 (two) times daily before a meal. 180 capsule 1   losartan (COZAAR) 50 MG tablet Take 1 tablet (50 mg total) by mouth daily. (Patient taking differently: Take 25 mg by mouth at bedtime.) 90 tablet 1   metFORMIN (GLUCOPHAGE-XR) 500 MG 24 hr tablet Take 1 tablet (500 mg total) by mouth 2 (two) times daily with a meal. 180 tablet 1   methocarbamol (ROBAXIN) 500 MG tablet Take 1 tablet (500 mg total) by mouth every 8 (eight) hours as needed for muscle spasms. 30 tablet 0   metoprolol tartrate (LOPRESSOR) 25 MG tablet TAKE 1 TABLET BY MOUTH TWICE DAILY 180 tablet 1   nitroGLYCERIN (NITROSTAT) 0.4 MG SL tablet Place 0.4 mg under the tongue every 5 (five) minutes as needed for chest pain. Reported on 02/26/2016     NONFORMULARY OR COMPOUNDED ITEM Apply 1 application. topically daily as needed (neuropathy). Compounded cream for foot pain. Gets at [employer].     ondansetron (ZOFRAN) 4 MG tablet Take 1 tablet (4 mg total) by mouth every 8 (eight) hours as needed for nausea or  vomiting. 30 tablet 0   polyethylene glycol (MIRALAX / GLYCOLAX) 17 g packet Take 17 g by mouth daily as needed for moderate constipation.     tadalafil (CIALIS) 20 MG tablet Take 1 tablet (20 mg total) by mouth daily as needed. 30 tablet 5   tadalafil (CIALIS) 5 MG tablet Take 1 tablet (5 mg total) by mouth daily as needed for erectile dysfunction. 30 tablet 11   glucose blood (FREESTYLE TEST STRIPS) test strip Use as instructed (Patient not taking: Reported on 08/12/2023) 100 each 12   No current facility-administered medications for this visit.    Allergies as of 08/12/2023 - Review Complete 08/12/2023  Allergen Reaction Noted   Iodinated contrast media Hives, Itching, and Nausea Only 11/12/2010   Iohexol Hives and Shortness Of Breath 10/15/2004   Metoclopramide hcl Hives 04/06/2011   Alfuzosin hcl er  11/03/2021   Sulfonamide derivatives Hives    Enalapril Cough 04/14/2017   Penicillins Rash    Sulfa antibiotics Rash 04/09/2014    Family History  Problem Relation Age of Onset   Coronary artery disease Mother        strong family history of   Heart attack Mother        dying of (at age 66)   Diabetes Mother    Diabetes Sister    Diabetes Brother    Pancreatitis Brother    Healthy Daughter    Hyperlipidemia Daughter    Heart disease Brother    Hypertension Brother    Hyperlipidemia Brother     Social History   Socioeconomic History   Marital status: Married    Spouse name: Not on file   Number of children: Not on file   Years of education: Not on file   Highest education level: Not on file  Occupational History   Not on file  Tobacco Use   Smoking status: Never   Smokeless tobacco: Never  Vaping Use   Vaping status: Never Used  Substance and Sexual Activity   Alcohol use: Not Currently    Comment: Patient may drink a beer or glass of wine a couple times a month   Drug use: No   Sexual activity:  Not on file  Other Topics Concern   Not on file  Social  History Narrative   The patient lives in [place] with his wife.  He is     disabled secondary to coronary artery disease and pancreatitis.  He does     not smoke, he does not drink alcohol.  There is no drug use.    Social Drivers of Corporate investment banker Strain: Low Risk  (11/02/2021)   Overall Financial Resource Strain (CARDIA)    Difficulty of Paying Living Expenses: Not hard at all  Food Insecurity: No Food Insecurity (11/02/2021)   Hunger Vital Sign    Worried About Running Out of Food in the Last Year: Never true    Ran Out of Food in the Last Year: Never true  Transportation Needs: No Transportation Needs (11/02/2021)   PRAPARE - Administrator, Civil Service (Medical): No    Lack of Transportation (Non-Medical): No  Physical Activity: Inactive (11/02/2021)   Exercise Vital Sign    Days of Exercise per Week: 0 days    Minutes of Exercise per Session: 0 min  Stress: No Stress Concern Present (11/02/2021)   [employer] of Occupational Health - Occupational Stress Questionnaire    Feeling of Stress : Not at all  Social Connections: Unknown (01/04/2022)   Received from [facility]   Social Network    Social Network: Not on file    Review of Systems: Gen: Denies fever, chills, cold or flulike symptoms, presyncope, syncope. CV: Denies chest pain, palpitations. Resp: Denies dyspnea at rest, cough. GI: See HPI Heme: See HPI  Physical Exam: BP 129/87 (BP Location: Right Arm, Patient Position: Sitting, Cuff Size: Normal)   Pulse 82   Temp 97.6 F (36.4 C) (Temporal)   Ht 5\' 9"  (1.753 m)   Wt 165 lb 9.6 oz (75.1 kg)   BMI 24.45 kg/m  General:   Alert and oriented. No distress noted. Pleasant and cooperative.  Head:  Normocephalic and atraumatic. Eyes:  Conjuctiva clear without scleral icterus. Heart:  S1, S2 present without murmurs appreciated. Lungs:  Clear to auscultation bilaterally. No wheezes, rales, or rhonchi. No distress.   Abdomen:  +BS, soft, and non-distended.  Mild suprapubic and LLQ tenderness to palpation.  No rebound or guarding. No HSM or masses noted. Msk:  Symmetrical without gross deformities. Normal posture. Extremities:  Without edema. Neurologic:  Alert and  oriented x4 Psych:  Normal mood and affect.    Assessment:  62 y.o. 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  Epigastric abdominal pain: Acute onset about 5 days ago with radiation to his back, now improving. Associated mild nausea without vomiting. No recent NSAIDs or ETOH. Compliant with prevacid daily and no reflux symptoms. Differentials include gastritis/duodenitis, PUD, gastroparesis flare, acute pancreatitis. Recommended increasing prevacid to twice daily for now and updating labs. We are also scheduling EGD as he is overdue due to history of barretts esophagus which will also help to evaluate his abdominal pain.  As symptoms are much better over the last 24 hours, no need for abdominal imaging at this time.  Advise that he could resume domperidone if he begins to have  recurrent nausea/vomiting/upper abdominal pain to see if this helps.  History of Barrett's esophagus: Overdue for surveillance.  Last EGD in March 2023 with changes suggestive of Barrett's esophagus.  Unclear if short segment or long segment as report states short segment, but says 7 cm.  His pathology was indeterminate for dysplasia and was advised to have a repeat EGD in 6 months.  We will get EGD scheduled in the near future.  He has maintained on daily PPI.  Lower abdominal  pain: Intermittent lower abdominal pain prior to bowel movements that seem to improve once he has a productive bowel movement.  On exam, he has mild tenderness in the left lower quadrant and suprapubic region.  He does have history of mild constipation/incomplete emptying.  Advised to go ahead and start MiraLAX every day and monitor his symptoms.  May have a component of IBS.  If persistent abdominal pain/worsening symptoms, would need to consider CT to rule out diverticulitis.   Pancreatic cystadenoma: Due for surveillance MRI in February 2025.  He is on recall.   Plan:  Proceed with upper endoscopy with propofol by Dr. Levon Hedger in the near future. The risks, benefits, and alternatives have been discussed with the patient in detail. The patient states understanding and desires to proceed.  ASA 3 Hold Plavix x 5 days prior.  No a.m. diabetes medications day of procedure. CBC, CMP, lipase Increase Prevacid to 30 mg twice daily. 4-6 small meals daily. Low fat and low fiber diet. May resume domperidone if upper abdominal pain continues or develops recurrent nausea/vomiting. Start MiraLAX 17 g daily. Patient will let me know of any persistent or worsening lower abdominal pain. Due for surveillance MRI with pancreatic protocol in February 2025.  He is on recall. Follow-up in office after EGD.   Ermalinda Memos, PA-C [facility] 08/12/2023   I have reviewed the note and agree with the APP's assessment as described in this progress note  [name], MD Gastroenterology and Hepatology [facility]

## 2023-08-12 ENCOUNTER — Other Ambulatory Visit: Payer: Self-pay

## 2023-08-12 ENCOUNTER — Ambulatory Visit: Payer: Medicare Other | Admitting: Gastroenterology

## 2023-08-12 ENCOUNTER — Encounter: Payer: Self-pay | Admitting: Gastroenterology

## 2023-08-12 VITALS — BP 129/87 | HR 82 | Temp 97.6°F | Ht 69.0 in | Wt 165.6 lb

## 2023-08-12 DIAGNOSIS — Z86018 Personal history of other benign neoplasm: Secondary | ICD-10-CM

## 2023-08-12 DIAGNOSIS — Z8719 Personal history of other diseases of the digestive system: Secondary | ICD-10-CM | POA: Diagnosis not present

## 2023-08-12 DIAGNOSIS — R1013 Epigastric pain: Secondary | ICD-10-CM | POA: Diagnosis not present

## 2023-08-12 DIAGNOSIS — D136 Benign neoplasm of pancreas: Secondary | ICD-10-CM

## 2023-08-12 DIAGNOSIS — R1032 Left lower quadrant pain: Secondary | ICD-10-CM | POA: Diagnosis not present

## 2023-08-12 DIAGNOSIS — R1030 Lower abdominal pain, unspecified: Secondary | ICD-10-CM

## 2023-08-12 DIAGNOSIS — K3184 Gastroparesis: Secondary | ICD-10-CM

## 2023-08-12 MED ORDER — LANSOPRAZOLE 30 MG PO CPDR: 30.0000 mg | DELAYED_RELEASE_CAPSULE | Freq: Two times a day (BID) | ORAL | 1 refills | Status: DC

## 2023-08-12 NOTE — Patient Instructions (Addendum)
We will get you scheduled for an upper endoscopy in the near future with Dr. Levon Hedger.  Please have labs completed at [facility].   Increase prevacid to 30 mg twice daily.   Dietary recommendations: 4-6 small meals daily Low fat diet Low fiber diet (avoid raw fruits and vegetables).  You may resume domperidone if upper abdominal pain continues.  Start MiraLAX 17 g daily in 8 oz of water to help with bowel regularity.  Let me know if you have any persistent or worsening lower abdominal pain and we can get a CT of your abdomen and pelvis.  We will follow-up with you in the office after your upper endoscopy.  It was nice to meet you today!  Ermalinda Memos, PA-C [facility]

## 2023-08-13 LAB — CBC WITH DIFFERENTIAL/PLATELET
Absolute Lymphocytes: 1950 {cells}/uL (ref 850–3900)
Absolute Monocytes: 516 {cells}/uL (ref 200–950)
Basophils Absolute: 30 {cells}/uL (ref 0–200)
Basophils Relative: 0.5 %
Eosinophils Absolute: 132 {cells}/uL (ref 15–500)
Eosinophils Relative: 2.2 %
HCT: 54.1 % — ABNORMAL HIGH (ref 38.5–50.0)
Hemoglobin: 18 g/dL — ABNORMAL HIGH (ref 13.2–17.1)
MCH: 28 pg (ref 27.0–33.0)
MCHC: 33.3 g/dL (ref 32.0–36.0)
MCV: 84.3 fL (ref 80.0–100.0)
MPV: 10.9 fL (ref 7.5–12.5)
Monocytes Relative: 8.6 %
Neutro Abs: 3372 {cells}/uL (ref 1500–7800)
Neutrophils Relative %: 56.2 %
Platelets: 234 10*3/uL (ref 140–400)
RBC: 6.42 10*6/uL — ABNORMAL HIGH (ref 4.20–5.80)
RDW: 14.3 % (ref 11.0–15.0)
Total Lymphocyte: 32.5 %
WBC: 6 10*3/uL (ref 3.8–10.8)

## 2023-08-13 LAB — COMPLETE METABOLIC PANEL WITH GFR
AG Ratio: 1.9 (calc) (ref 1.0–2.5)
ALT: 15 U/L (ref 9–46)
AST: 19 U/L (ref 10–35)
Albumin: 4.4 g/dL (ref 3.6–5.1)
Alkaline phosphatase (APISO): 64 U/L (ref 35–144)
BUN: 20 mg/dL (ref 7–25)
CO2: 30 mmol/L (ref 20–32)
Calcium: 9.6 mg/dL (ref 8.6–10.3)
Chloride: 99 mmol/L (ref 98–110)
Creat: 1.29 mg/dL (ref 0.70–1.35)
Globulin: 2.3 g/dL (ref 1.9–3.7)
Glucose, Bld: 108 mg/dL (ref 65–139)
Potassium: 3.9 mmol/L (ref 3.5–5.3)
Sodium: 140 mmol/L (ref 135–146)
Total Bilirubin: 0.6 mg/dL (ref 0.2–1.2)
Total Protein: 6.7 g/dL (ref 6.1–8.1)
eGFR: 63 mL/min/{1.73_m2} (ref >=60)

## 2023-08-13 LAB — LIPASE: Lipase: 27 U/L (ref 7–60)

## 2023-08-15 ENCOUNTER — Encounter: Payer: Self-pay | Admitting: *Deleted

## 2023-08-19 ENCOUNTER — Encounter: Payer: Self-pay | Admitting: *Deleted

## 2023-08-22 ENCOUNTER — Ambulatory Visit: Payer: Medicare Other | Admitting: Internal Medicine

## 2023-08-22 ENCOUNTER — Ambulatory Visit: Payer: Self-pay | Admitting: Internal Medicine

## 2023-08-22 ENCOUNTER — Encounter: Payer: Self-pay | Admitting: Internal Medicine

## 2023-08-22 ENCOUNTER — Other Ambulatory Visit: Payer: Self-pay

## 2023-08-22 ENCOUNTER — Ambulatory Visit: Payer: Medicare Other

## 2023-08-22 DIAGNOSIS — Z1152 Encounter for screening for COVID-19: Secondary | ICD-10-CM

## 2023-08-22 DIAGNOSIS — R6889 Other general symptoms and signs: Secondary | ICD-10-CM

## 2023-08-22 DIAGNOSIS — B9789 Other viral agents as the cause of diseases classified elsewhere: Secondary | ICD-10-CM

## 2023-08-22 NOTE — Progress Notes (Signed)
Pt. Gave consent and participated in Rapid Strep screening as well as Covid, RSV, and Flu Screenings. Both were swabs completed in office. Pt. Has been informed that results will be sent to his mychart and that office staff will  call with results.

## 2023-08-22 NOTE — Telephone Encounter (Signed)
Called pt and scheduled appt pt states that he will arrive early to check in

## 2023-08-22 NOTE — Telephone Encounter (Addendum)
Copied from CRM [id number]. Topic: Clinical - Pink Word Triage >> Aug 22, 2023 12:05 PM Ivette P wrote: Reason for Triage: Fever, chills. Throat hurting body aches, shaking, abdominal pain.  Chief Complaint: Sore throat, abdominal pain Symptoms: Body aches, sore throat, abdominal pain, testicular pain, burning with urination, cough, potential fever, chills, shivering Frequency: Constant Pertinent Negatives: Patient denies chest pain, SOB, vomiting, diarrhea, swelling or redness to testicles Disposition: [x] ED /[] Urgent Care (no appt availability in office) / [] Appointment(In office/virtual)/ []  [facility]/ [] Home Care/ [x] Refused Recommended Disposition /[] [facility]/ [x]  Follow-up with PCP Additional Notes: Pt reporting pain in throat, "aching in every fiber of my body this morning," "feels like somebody used me like a punching bag," and his "testicle feels like someone kicked him "feels so bruised," "hurting everywhere, stomach and throat are the worst," abdomen hurts all over in every direction per pt. Pt reporting that he "feels like I'm burning up" then shivering, having body aches all over, "ears are ringing so bad that if I turn my head too fast, I fell like I'm going to pass out." Pt reporting he was "shivering real bad this morning, blanket helped some." Pt is experiencing sore throat and testicular pain, pt confirms no swelling or redness to testicles. Pt reporting that he is "hurting everywhere, stomach and throat are the worst." Pt reporting that the abdominal pain has lasted for a few weeks but that pain along with the other symptoms have come up just in the past couple days, "this morning is worst day" for all his pain/symptoms. Pt reporting that the pain is constant. Pt reporting that his throat pain is equivalent to "last time it hurt this bad, had to do endoscopic," pt reporting hx of diabetes and stating that diabetics sometimes get yeast or thrush. Pt reporting that  "same thing happened to me 3 years ago, had strep" with his throat and all. Pt reporting that his testicular pain is 5/10 with the "left one is always worse than the right one but the right one hurts, feels like pulled testicle muscle" in groin. Pt reporting that his abdominal pain is about a "6 or 7" out of 10. Pt reporting that he has hx of chronic pancreatitis. Pt reporting that he was recently seen by GI doc on Wednesday or Thursday of last week, when GI doc pressed on pt's left side he "about jumped off the table." Pt reporting that GI doc scheduled pt for an endoscopy for 09/02/23. Pt reporting that he thinks his "sore throat causing the testicle" pain. Pt reporting that his "urinal canal, it hurts," when he urinates "it's a little rough like having a burning pain." Pt reporting he sees a urologist, reporting his pain is not like his prostate. Pt reporting that he is having "extreme ringing in ears, have tinnitus anyway but this is worse, sounds like somebody in there blowing their horn" and pt describing an oscillating fan to equate what the ringing sounds like. Pt reporting that this all "started with a sore throat," thought it would be his sinuses, still coughing but had "quit coughing for a while." Pt reporting that he "just feels like internally hot but when breathe feels like struggling a bit" but confirms no SOB or chest pain, "all that's good." Pt reporting "constant sore throat, abdominal pain, and testicular pain, down the legs, hands, arms, shoulders, wherever, everything else is just a shivering type scenario," reporting that "earlier shaking so bad" that he was having "trouble breathing."  Pt confirms no SOB. Pt reporting burning/pain with urination, confirms no blood in urine. Pt reporting he "can't sit still." Advised ED for severity and breadth of symptoms. Pt reporting he is "trying to avoid ED" due to insurance reasons and wanting to avoid the copay. Pt reporting he "want to try some soup  first, not eaten anything today" and see how he feels. Pt is open to an office appt today. No availability with PCP office today, tried connecting to [facility] x2 to see about appt/advice for pt, no answer. No availability at nearby office for today. Advised UC, pt will go to UC near his home in [place], [place]. Pt reporting concern about not being able to reach his doc for these things, reporting that "it's gonna be a problem." Nurse tried connecting to [facility] one more time per pt preference, no answer. Offered to give contact info for Patient Experience, pt denied. Nurse told pt that high priority message would be sent to office to see about calling him back for an appt or with advice from Dr. Allena Katz or doc/triage nurse in office - please advise. Pt verbalized understanding. Advised pt go to ED for any sort of worsening in symptoms, pt verbalized understanding. Please advise.  Reason for Disposition  [1] Pain in the scrotum or testicle AND [2] present > 1 hour  Answer Assessment - Initial Assessment Questions 1. LOCATION: "Where does it hurt?"      Pt reporting pain in throat, "aching in every fiber of my body this morning," "feels like somebody used me like a punching bag," and his "testicle feels like someone kicked him "feels so bruised," "hurting everywhere, stomach and throat are the worst," abdomen hurts all over in every direction per pt. 3. ONSET: "When did the pain begin?" (Minutes, hours or days ago)      Pt reporting that abdominal pain started few weeks ago, was seen by GI, but other symptoms all within the past couple days. 4. SUDDEN: "Gradual or sudden onset?"     Pt reporting that the abdominal pain has lasted for a few weeks but that pain along with the other symptoms have come up just in the past couple days, "this morning is worst day" for all his pain/symptoms. 5. PATTERN "Does the pain come and go, or is it constant?"    - If it comes and goes: "How long does it last?" "Do you have pain now?"      (Note: Comes and goes means the pain is intermittent. It goes away completely between bouts.)    - If constant: "Is it getting better, staying the same, or getting worse?"      (Note: Constant means the pain never goes away completely; most serious pain is constant and gets worse.)      Pt reporting that the pain is constant. 6. SEVERITY: "How bad is the pain?"  (e.g., Scale 1-10; mild, moderate, or severe)    - MILD (1-3): Doesn't interfere with normal activities, abdomen soft and not tender to touch.     - MODERATE (4-7): Interferes with normal activities or awakens from sleep, abdomen tender to touch.     - SEVERE (8-10): Excruciating pain, doubled over, unable to do any normal activities.       Pt reporting that his throat pain is equivalent to "last time it hurt this bad, had to do endoscopic." Pt reporting that his testicular pain is 5/10 with the "left one is always worse than the right one but  the right one hurts, feels like pulled testicle muscle" in groin. Pt reporting that his abdominal pain is about a "6 or 7" out of 10. 7. RECURRENT SYMPTOM: "Have you ever had this type of stomach pain before?" If Yes, ask: "When was the last time?" and "What happened that time?"      Pt reporting that he has hx of chronic pancreatitis. Pt reporting that he was recently seen by GI doc on Wednesday or Thursday of last week, when GI doc pressed on pt's left side he "about jumped off the table." Pt reporting that GI doc scheduled pt for an endoscopy for 09/02/23. 8. CAUSE: "What do you think is causing the stomach pain?"     Pt reporting that he thinks his "sore throat causing the testicle" pain. Pt reporting that he had the sore throat first. 9. RELIEVING/AGGRAVATING FACTORS: "What makes it better or worse?" (e.g., antacids, bending or twisting motion, bowel movement)     Pt reporting that his "urinal canal, it hurts," when he urinates "it's a little rough like having a burning pain." 10. OTHER SYMPTOMS: "Do  you have any other symptoms?" (e.g., back pain, diarrhea, fever, urination pain, vomiting)       Pt reporting that he "feels like I'm burning up" then shivering, having body aches all over, "ears are ringing so bad that if I turn my head too fast, I fell like I'm going to pass out." Pt reporting that he is "hurting everywhere, stomach and throat are the worst." Pt is experiencing sore throat and testicular pain, pt confirms no swelling or redness to testicles. Pt reporting that he is having "extreme ringing in ears, have tinnitus anyway but this is worse, sounds like somebody in there blowing their horn" and pt describing an oscillating fan to equate what the ringing sounds like. Pt reporting that this all "started with a sore throat," thought it would be his sinuses, still coughing but had "quit coughing for a while." Pt reporting that he "just feels like internally hot but when breathe feels like struggling a bit" but confirms no SOB or chest pain, "all that's good." Pt reporting "constant sore throat, abdominal pain, and testicular pain, down the legs, hands, arms, shoulders, wherever, everything else is just a shivering type scenario," reporting that "earlier shaking so bad" that he was having "trouble breathing." Pt confirms no SOB. Pt reporting burning/pain with urination, confirms no blood in urine. Pt reporting he "can't sit still."  Protocols used: Abdominal Pain - Male-A-AH

## 2023-08-23 ENCOUNTER — Encounter: Payer: Self-pay | Admitting: Internal Medicine

## 2023-08-23 ENCOUNTER — Telehealth: Payer: Medicare Other | Admitting: Internal Medicine

## 2023-08-23 DIAGNOSIS — J069 Acute upper respiratory infection, unspecified: Secondary | ICD-10-CM | POA: Diagnosis not present

## 2023-08-23 DIAGNOSIS — R6889 Other general symptoms and signs: Secondary | ICD-10-CM | POA: Diagnosis not present

## 2023-08-23 DIAGNOSIS — E1142 Type 2 diabetes mellitus with diabetic polyneuropathy: Secondary | ICD-10-CM

## 2023-08-23 MED ORDER — OSELTAMIVIR PHOSPHATE 75 MG PO CAPS: 75.0000 mg | ORAL_CAPSULE | Freq: Two times a day (BID) | ORAL | 0 refills | Status: DC

## 2023-08-23 MED ORDER — HYDROCODONE-ACETAMINOPHEN 10-325 MG PO TABS: 1.0000 | ORAL_TABLET | Freq: Four times a day (QID) | ORAL | 0 refills | Status: DC | PRN

## 2023-08-23 NOTE — Progress Notes (Signed)
 Virtual Visit via Video Note   Because of [name]'s co-morbid illnesses, he is at least at moderate risk for complications without adequate follow up.  This format is felt to be most appropriate for this patient at this time.  All issues noted in this document were discussed and addressed.  A limited physical exam was performed with this format.      Evaluation Performed:  Follow-up visit  Date:  08/23/2023   ID:  Daruis, Swaim Feb 07, 1961, MRN [id number]  Patient Location: Home Provider Location: Office/Clinic  Participants: Patient Location of Patient: Home Location of Provider: Telehealth Consent was obtain for visit to be over via telehealth. I verified that I am speaking with the correct person using two identifiers.  PCP:  [name], MD   Chief Complaint: Nasal congestion, fatigue and myalgias  History of Present Illness:    [name] is a 62 y.o. male who has a video visit for complaint of nasal congestion, postnasal drip, fatigue and myalgias for the last 2 days.  He also reports sore throat.  Denies any dyspnea or wheezing currently.  He has subjective fever and chills, but denies any temperature above 100.4 F. He had invited 12 people at home during Christmas, but denies anyone being sick at that time.  The patient does have symptoms concerning for COVID-19 infection (fever, chills, cough, or new shortness of breath).   Past Medical, Surgical, Social History, Allergies, and Medications have been Reviewed.  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  Patent coronary bypass grafts with a free radial graft to PDA and LIMA to the  diag.   Diabetes mellitus    Diabetic peripheral neuropathy (HCC) 05/06/2020   Dyslipidemia    Poorly  controlled, probably related to his DM (Some of this is secondary to his inability to afford medications).   ED (erectile dysfunction)    Esophageal yeast infection (HCC)    Gastroparesis    Hypertension    Metaplasia of esophagus    Pancreatitis chronic    Pseudocyst   Polycythemia vera(238.4) 04/26/2012   PONV (postoperative nausea and vomiting)    Past Surgical History:  Procedure Laterality Date   APPENDECTOMY     BIOPSY  11/11/2021   Procedure: BIOPSY;  Surgeon: Golda Claudis PENNER, MD;  [facility];  Service: Endoscopy;;   CARDIAC CATHETERIZATION     CHOLECYSTECTOMY     COLONOSCOPY  07/31/1985   COLONOSCOPY N/A 03/11/2016   Procedure: COLONOSCOPY;  Surgeon: Claudis PENNER Golda, MD;  [facility]: [facility];  Service: Endoscopy;  Laterality: N/A;   COLONOSCOPY WITH PROPOFOL  N/A 11/11/2021   Procedure: COLONOSCOPY WITH PROPOFOL ;  Surgeon: Golda Claudis PENNER, MD;  [facility]: [facility];  Service: Endoscopy;  Laterality: N/A;  205   CORONARY ANGIOPLASTY WITH STENT PLACEMENT  2006   CORONARY ARTERY BYPASS GRAFT  11/24/2004   ESOPHAGOGASTRODUODENOSCOPY  09/19/01   ESOPHAGOGASTRODUODENOSCOPY N/A 03/11/2016   Procedure: ESOPHAGOGASTRODUODENOSCOPY (EGD);  Surgeon: Claudis PENNER Golda, MD;  [facility];  Service: Endoscopy;  Laterality: N/A;  12:00   ESOPHAGOGASTRODUODENOSCOPY (EGD) WITH PROPOFOL  N/A 11/11/2021   Procedure: ESOPHAGOGASTRODUODENOSCOPY (EGD) WITH PROPOFOL ;  Surgeon: Golda Claudis  U, MD;  [facility]: [facility];  Service: Endoscopy;  Laterality: N/A;   FINGER TENDON REPAIR     in middle finger   KNEE SURGERY     left knee   NASAL SEPTUM SURGERY     POLYPECTOMY  11/11/2021   Procedure: POLYPECTOMY;  Surgeon: Golda Claudis PENNER, MD;  [facility]: [facility];  Service: Endoscopy;;     No outpatient medications have been marked as taking for the 08/23/23 encounter (Video Visit) with [name], MD.     Allergies:   Iodinated contrast media, Iohexol ,  Metoclopramide hcl, Alfuzosin  hcl er, Sulfonamide derivatives, Enalapril , Penicillins, and Sulfa antibiotics   ROS:   Please see the history of present illness. All other systems reviewed and are negative.   Labs/Other Tests and Data Reviewed:    Recent Labs: 12/30/2022: TSH 3.110 08/12/2023: ALT 15; BUN 20; Creat 1.29; Hemoglobin 18.0; Platelets 234; Potassium 3.9; Sodium 140   Recent Lipid Panel Lab Results  Component Value Date/Time   CHOL 261 (H) 07/04/2023 02:37 PM   TRIG 405 (H) 07/04/2023 02:37 PM   HDL 45 07/04/2023 02:37 PM   CHOLHDL 5.8 (H) 07/04/2023 02:37 PM   CHOLHDL NOT REPORTED DUE TO HIGH TRIGLYCERIDES 09/29/2021 03:31 PM   LDLCALC 142 (H) 07/04/2023 02:37 PM   LDLDIRECT 110 08/19/2016 12:59 PM    Wt Readings from Last 3 Encounters:  08/12/23 165 lb 9.6 oz (75.1 kg)  07/04/23 161 lb 3.2 oz (73.1 kg)  02/09/23 171 lb (77.6 kg)     Objective:    Vital Signs:  There were no vitals taken for this visit.   VITAL SIGNS:  reviewed GEN:  no acute distress EYES:  sclerae anicteric, EOMI - Extraocular Movements Intact RESPIRATORY:  normal respiratory effort, symmetric expansion NEURO:  alert and oriented x 3, no obvious focal deficit PSYCH:  normal affect  ASSESSMENT & PLAN:    Viral URTI Flulike symptoms Check flu, COVID and RSV test Rapid strep test negative Considering his chronic medical conditions and flulike symptoms, empirically started Tamiflu  Flonase for nasal congestion Tylenol  as needed for myalgias   I discussed the assessment and treatment plan with the patient. The patient was provided an opportunity to ask questions, and all were answered. The patient agreed with the plan and demonstrated an understanding of the instructions.   The patient was advised to call back or seek an in-person evaluation if the symptoms worsen or if the condition fails to improve as anticipated.  The above assessment and management plan was discussed with the  patient. The patient verbalized understanding of and has agreed to the management plan.   Medication Adjustments/Labs and Tests Ordered: Current medicines are reviewed at length with the patient today.  Concerns regarding medicines are outlined above.   Tests Ordered: No orders of the defined types were placed in this encounter.   Medication Changes: No orders of the defined types were placed in this encounter.    Note: This dictation was prepared with Dragon dictation along with smaller phrase technology. Similar sounding words can be transcribed inadequately or may not be corrected upon review. Any transcriptional errors that result from this process are unintentional.      Disposition:  Follow up  Signed, Suzzane POUR Tobie, MD  08/23/2023 10:51 AM     [facility]

## 2023-08-24 LAB — CULTURE, GROUP A STREP

## 2023-08-24 LAB — RAPID STREP SCREEN (MED CTR MEBANE ONLY): Strep Gp A Ag, IA W/Reflex: NEGATIVE

## 2023-08-25 ENCOUNTER — Encounter: Payer: Self-pay | Admitting: Urology

## 2023-08-25 ENCOUNTER — Encounter: Payer: Self-pay | Admitting: Internal Medicine

## 2023-08-25 ENCOUNTER — Ambulatory Visit: Payer: Medicare Other | Admitting: Internal Medicine

## 2023-08-25 ENCOUNTER — Other Ambulatory Visit: Payer: Self-pay | Admitting: Internal Medicine

## 2023-08-25 DIAGNOSIS — U071 COVID-19: Secondary | ICD-10-CM

## 2023-08-25 LAB — COVID-19, FLU A+B AND RSV
Influenza A, NAA: NOT DETECTED
Influenza B, NAA: NOT DETECTED
RSV, NAA: NOT DETECTED
SARS-CoV-2, NAA: DETECTED — AB

## 2023-08-25 MED ORDER — NIRMATRELVIR/RITONAVIR (PAXLOVID)TABLET: 3.0000 | ORAL_TABLET | Freq: Two times a day (BID) | ORAL | 0 refills | Status: AC

## 2023-08-26 ENCOUNTER — Ambulatory Visit: Payer: Medicare Other | Admitting: Urology

## 2023-08-26 DIAGNOSIS — N401 Enlarged prostate with lower urinary tract symptoms: Secondary | ICD-10-CM

## 2023-08-28 ENCOUNTER — Other Ambulatory Visit: Payer: Self-pay | Admitting: Internal Medicine

## 2023-08-28 DIAGNOSIS — I10 Essential (primary) hypertension: Secondary | ICD-10-CM

## 2023-08-29 ENCOUNTER — Ambulatory Visit: Payer: Medicare Other | Admitting: Internal Medicine

## 2023-08-30 ENCOUNTER — Telehealth (INDEPENDENT_AMBULATORY_CARE_PROVIDER_SITE_OTHER): Payer: Self-pay | Admitting: Gastroenterology

## 2023-08-30 NOTE — Telephone Encounter (Signed)
 Pt left voicemail stating that he has COVID and doesn't think he will be able to complete pre op or EGD scheduled for Friday. Returned call to pt but had to leave message.    ([facility] EGD [facility])

## 2023-08-31 ENCOUNTER — Encounter (HOSPITAL_COMMUNITY)
Admission: RE | Admit: 2023-08-31 | Discharge: 2023-08-31 | Disposition: A | Discharge status: Home / Self Care | Payer: Medicare Other | Source: Ambulatory Visit | Attending: Gastroenterology | Admitting: Gastroenterology

## 2023-08-31 NOTE — Pre-Procedure Instructions (Signed)
 Attempted pre-op phone call. Left VM for him to call us back.

## 2023-09-02 ENCOUNTER — Ambulatory Visit (HOSPITAL_COMMUNITY): Admit: 2023-09-02 | Payer: Medicare Other | Admitting: Gastroenterology

## 2023-09-02 ENCOUNTER — Encounter (HOSPITAL_COMMUNITY): Payer: Self-pay

## 2023-09-02 SURGERY — ESOPHAGOGASTRODUODENOSCOPY (EGD) WITH PROPOFOL
Anesthesia: Monitor Anesthesia Care

## 2023-09-04 DIAGNOSIS — E1140 Type 2 diabetes mellitus with diabetic neuropathy, unspecified: Secondary | ICD-10-CM | POA: Diagnosis not present

## 2023-09-16 ENCOUNTER — Other Ambulatory Visit: Payer: Self-pay | Admitting: Internal Medicine

## 2023-09-19 DIAGNOSIS — D751 Secondary polycythemia: Secondary | ICD-10-CM | POA: Diagnosis not present

## 2023-09-21 ENCOUNTER — Other Ambulatory Visit (HOSPITAL_COMMUNITY): Payer: Self-pay

## 2023-09-21 ENCOUNTER — Telehealth: Payer: Self-pay | Admitting: Pharmacy Technician

## 2023-09-21 ENCOUNTER — Ambulatory Visit (INDEPENDENT_AMBULATORY_CARE_PROVIDER_SITE_OTHER): Payer: PPO | Admitting: Urology

## 2023-09-21 VITALS — BP 148/83 | HR 75

## 2023-09-21 DIAGNOSIS — D751 Secondary polycythemia: Secondary | ICD-10-CM | POA: Diagnosis not present

## 2023-09-21 DIAGNOSIS — Z8669 Personal history of other diseases of the nervous system and sense organs: Secondary | ICD-10-CM | POA: Diagnosis not present

## 2023-09-21 DIAGNOSIS — N138 Other obstructive and reflux uropathy: Secondary | ICD-10-CM

## 2023-09-21 DIAGNOSIS — R39198 Other difficulties with micturition: Secondary | ICD-10-CM

## 2023-09-21 DIAGNOSIS — R351 Nocturia: Secondary | ICD-10-CM | POA: Diagnosis not present

## 2023-09-21 DIAGNOSIS — N5201 Erectile dysfunction due to arterial insufficiency: Secondary | ICD-10-CM | POA: Diagnosis not present

## 2023-09-21 DIAGNOSIS — N401 Enlarged prostate with lower urinary tract symptoms: Secondary | ICD-10-CM

## 2023-09-21 DIAGNOSIS — E1142 Type 2 diabetes mellitus with diabetic polyneuropathy: Secondary | ICD-10-CM | POA: Diagnosis not present

## 2023-09-21 MED ORDER — TADALAFIL 5 MG PO TABS: 5.0000 mg | ORAL_TABLET | Freq: Every day | ORAL | 11 refills | Status: AC | PRN

## 2023-09-21 MED ORDER — ALFUZOSIN HCL ER 10 MG PO TB24: 10.0000 mg | ORAL_TABLET | Freq: Every evening | ORAL | 11 refills | Status: DC

## 2023-09-21 MED ORDER — TADALAFIL 20 MG PO TABS: 20.0000 mg | ORAL_TABLET | Freq: Every day | ORAL | 5 refills | Status: DC | PRN

## 2023-09-21 NOTE — Progress Notes (Signed)
09/21/2023 4:25 PM   [name] 1960/11/23 [id number]  Referring provider: [name], MD [address] [place],  [place] [address]  Erectile dysfunction   HPI: [name] is a 63yo here for followup for BPH and erectile dysfunction. IPSS 15 QOL 3 on uroxatral 10mg . He takes uroxatral prn. Uirne stream mostly strong. No straining to urinate. Nocturia 2-3x. He uses tadalafil 5mg  daily and 20mg  prn with good results.    PMH: Past Medical History:  Diagnosis Date   Anemia    Bypass graft stenosis (HCC)    '06   Cancer (HCC)    Polycythemia vera   Cervical spinal stenosis 05/06/2020   C5-6 level   Chronic gastritis    Coronary artery disease    Patent Stent to Circ.  Patent coronary bypass grafts with a free radial graft to PDA and LIMA to the  diag.   Diabetes mellitus    Diabetic peripheral neuropathy (HCC) 05/06/2020   Dyslipidemia    Poorly controlled, probably related to his DM (Some of this is secondary to his inability to afford medications).   ED (erectile dysfunction)    Esophageal yeast infection (HCC)    Gastroparesis    Hypertension    Metaplasia of esophagus    Pancreatitis chronic    Pseudocyst   Polycythemia vera(238.4) 04/26/2012   PONV (postoperative nausea and vomiting)     Surgical History: Past Surgical History:  Procedure Laterality Date   APPENDECTOMY     BIOPSY  11/11/2021   Procedure: BIOPSY;  Surgeon: Malissa Hippo, MD;  [facility];  Service: Endoscopy;;   CARDIAC CATHETERIZATION     CHOLECYSTECTOMY     COLONOSCOPY  07/31/1985   COLONOSCOPY N/A 03/11/2016   Procedure: COLONOSCOPY;  Surgeon: Malissa Hippo, MD;  [facility];  Service: Endoscopy;  Laterality: N/A;   COLONOSCOPY WITH PROPOFOL N/A 11/11/2021   Procedure: COLONOSCOPY WITH PROPOFOL;  Surgeon: Malissa Hippo, MD;  [facility];  Service: Endoscopy;  Laterality: N/A;  205   CORONARY ANGIOPLASTY WITH STENT PLACEMENT  2006   CORONARY  ARTERY BYPASS GRAFT  11/24/2004   ESOPHAGOGASTRODUODENOSCOPY  09/19/01   ESOPHAGOGASTRODUODENOSCOPY N/A 03/11/2016   Procedure: ESOPHAGOGASTRODUODENOSCOPY (EGD);  Surgeon: Malissa Hippo, MD;  [facility];  Service: Endoscopy;  Laterality: N/A;  12:00   ESOPHAGOGASTRODUODENOSCOPY (EGD) WITH PROPOFOL N/A 11/11/2021   Procedure: ESOPHAGOGASTRODUODENOSCOPY (EGD) WITH PROPOFOL;  Surgeon: Malissa Hippo, MD;  [facility];  Service: Endoscopy;  Laterality: N/A;   FINGER TENDON REPAIR     in middle finger   KNEE SURGERY     left knee   NASAL SEPTUM SURGERY     POLYPECTOMY  11/11/2021   Procedure: POLYPECTOMY;  Surgeon: Malissa Hippo, MD;  [facility];  Service: Endoscopy;;    Home Medications:  Allergies as of 09/21/2023       Reactions   Iodinated Contrast Media Hives, Itching, Nausea Only   Flushing, Burning, itching Other reaction(s): Angioedema (ALLERGY/intolerance)   Iohexol Hives, Shortness Of Breath    Desc: HIVES,SOB NEEDS PREP.MEDS   Metoclopramide Hcl Hives   Patient became very spastic   Alfuzosin Hcl Er    Dropped blood pressure   Sulfonamide Derivatives Hives   Large hives   Enalapril Cough   Penicillins Rash   Has patient had a PCN reaction causing immediate rash, facial/tongue/throat swelling, SOB or lightheadedness with hypotension: Yes Has patient had a  PCN reaction causing severe rash involving mucus membranes or skin necrosis: No Has patient had a PCN reaction that required hospitalization No Has patient had a PCN reaction occurring within the last 10 years: No. If all of the above answers are "NO", then may proceed with Cephalosporin use.   Sulfa Antibiotics Rash   Other Reaction: Not Assessed        Medication List        Accurate as of September 21, 2023  4:25 PM. If you have any questions, ask your nurse or doctor.          alfuzosin 10 MG 24 hr tablet Commonly known as: UROXATRAL Take 1 tablet (10 mg total) by  mouth at bedtime.   Cholecalciferol 125 MCG (5000 UT) Tabs Take 10,000 Units by mouth daily.   clopidogrel 75 MG tablet Commonly known as: PLAVIX TAKE 1 TABLET BY MOUTH ONCE DAILY as a blood thinner   Dexcom G7 Receiver Devi USE TO CHECK GLUCOSE AS NEEDED   Dexcom G7 Sensor Misc CHANGE SENSOR EVERY 10 DAYS   diazepam 10 MG tablet Commonly known as: VALIUM Take 0.5 tablets (5 mg total) by mouth at bedtime as needed for anxiety or sleep.   DULoxetine 60 MG capsule Commonly known as: CYMBALTA Take 1 capsule (60 mg total) by mouth daily.   ezetimibe 10 MG tablet Commonly known as: ZETIA Take 1 tablet (10 mg total) by mouth daily.   FREESTYLE TEST STRIPS test strip Generic drug: glucose blood Use as instructed   gabapentin 300 MG capsule Commonly known as: NEURONTIN TAKE (2) CAPSULES BY MOUTH (3) TIMES DAILY.   hydrochlorothiazide 25 MG tablet Commonly known as: HYDRODIURIL TAKE 1 TABLET BY MOUTH IN THE MORNING   HYDROcodone-acetaminophen 10-325 MG tablet Commonly known as: NORCO Take 1 tablet by mouth every 6 (six) hours as needed for severe pain (pain score 7-10).   icosapent Ethyl 1 g capsule Commonly known as: VASCEPA Take 2 capsules (2 g total) by mouth 2 (two) times daily.   insulin NPH-regular Human (70-30) 100 UNIT/ML injection Inject 60 Units into the skin 2 (two) times daily before a meal. When glucose is above 90 and eating   lansoprazole 30 MG capsule Commonly known as: PREVACID Take 1 capsule (30 mg total) by mouth 2 (two) times daily before a meal.   losartan 50 MG tablet Commonly known as: COZAAR Take 1 tablet (50 mg total) by mouth daily. What changed:  how much to take when to take this   metFORMIN 500 MG 24 hr tablet Commonly known as: GLUCOPHAGE-XR Take 1 tablet (500 mg total) by mouth 2 (two) times daily with a meal.   methocarbamol 500 MG tablet Commonly known as: ROBAXIN Take 1 tablet (500 mg total) by mouth every 8 (eight) hours as  needed for muscle spasms.   metoprolol tartrate 25 MG tablet Commonly known as: LOPRESSOR TAKE 1 TABLET BY MOUTH TWICE DAILY   nitroGLYCERIN 0.4 MG SL tablet Commonly known as: NITROSTAT Place 0.4 mg under the tongue every 5 (five) minutes as needed for chest pain. Reported on 02/26/2016   NONFORMULARY OR COMPOUNDED ITEM Apply 1 application. topically daily as needed (neuropathy). Compounded cream for foot pain. Gets at [employer].   ondansetron 4 MG tablet Commonly known as: ZOFRAN Take 1 tablet (4 mg total) by mouth every 8 (eight) hours as needed for nausea or vomiting.   polyethylene glycol 17 g packet Commonly known as: MIRALAX / GLYCOLAX Take 17 g  by mouth daily as needed for moderate constipation.   tadalafil 20 MG tablet Commonly known as: CIALIS Take 1 tablet (20 mg total) by mouth daily as needed.   tadalafil 5 MG tablet Commonly known as: CIALIS Take 1 tablet (5 mg total) by mouth daily as needed for erectile dysfunction.        Allergies:  Allergies  Allergen Reactions   Iodinated Contrast Media Hives, Itching and Nausea Only    Flushing, Burning, itching Other reaction(s): Angioedema (ALLERGY/intolerance)    Iohexol Hives and Shortness Of Breath     Desc: HIVES,SOB NEEDS PREP.MEDS    Metoclopramide Hcl Hives    Patient became very spastic   Alfuzosin Hcl Er     Dropped blood pressure   Sulfonamide Derivatives Hives    Large hives   Enalapril Cough   Penicillins Rash    Has patient had a PCN reaction causing immediate rash, facial/tongue/throat swelling, SOB or lightheadedness with hypotension: Yes Has patient had a PCN reaction causing severe rash involving mucus membranes or skin necrosis: No Has patient had a PCN reaction that required hospitalization No Has patient had a PCN reaction occurring within the last 10 years: No. If all of the above answers are "NO", then may proceed with Cephalosporin use.    Sulfa Antibiotics Rash    Other  Reaction: Not Assessed    Family History: Family History  Problem Relation Age of Onset   Coronary artery disease Mother        strong family history of   Heart attack Mother        dying of (at age 79)   Diabetes Mother    Diabetes Sister    Diabetes Brother    Pancreatitis Brother    Healthy Daughter    Hyperlipidemia Daughter    Heart disease Brother    Hypertension Brother    Hyperlipidemia Brother     Social History:  reports that he has never smoked. He has never used smokeless tobacco. He reports that he does not currently use alcohol. He reports that he does not use drugs.  ROS: All other review of systems were reviewed and are negative except what is noted above in HPI  Physical Exam: BP (!) 148/83   Pulse 75   Constitutional:  Alert and oriented, No acute distress. HEENT: [place] AT, moist mucus membranes.  Trachea midline, no masses. Cardiovascular: No clubbing, cyanosis, or edema. Respiratory: Normal respiratory effort, no increased work of breathing. GI: Abdomen is soft, nontender, nondistended, no abdominal masses GU: No CVA tenderness.  Lymph: No cervical or inguinal lymphadenopathy. Skin: No rashes, bruises or suspicious lesions. Neurologic: Grossly intact, no focal deficits, moving all 4 extremities. Psychiatric: Normal mood and affect.  Laboratory Data: Lab Results  Component Value Date   WBC 6.0 08/12/2023   HGB 18.0 (H) 08/12/2023   HCT 54.1 (H) 08/12/2023   MCV 84.3 08/12/2023   PLT 234 08/12/2023    Lab Results  Component Value Date   CREATININE 1.29 08/12/2023    No results found for: "PSA"  No results found for: "TESTOSTERONE"  Lab Results  Component Value Date   HGBA1C 8.8 (H) 07/04/2023    Urinalysis    Component Value Date/Time   COLORURINE YELLOW 09/29/2021 1532   APPEARANCEUR Clear 04/13/2023 1518   LABSPEC 1.016 09/29/2021 1532   PHURINE 5.0 09/29/2021 1532   GLUCOSEU 2+ (A) 04/13/2023 1518   HGBUR NEGATIVE 09/29/2021  1532   BILIRUBINUR Negative 04/13/2023 1518  KETONESUR NEGATIVE 09/29/2021 1532   PROTEINUR 1+ (A) 04/13/2023 1518   PROTEINUR NEGATIVE 09/29/2021 1532   UROBILINOGEN 0.2 03/28/2012 1042   NITRITE Negative 04/13/2023 1518   NITRITE NEGATIVE 09/29/2021 1532   LEUKOCYTESUR Negative 04/13/2023 1518   LEUKOCYTESUR NEGATIVE 09/29/2021 1532    Lab Results  Component Value Date   LABMICR See below: 04/13/2023   WBCUA 0-5 04/13/2023   LABEPIT 0-10 04/13/2023   MUCUS Present 05/14/2021   BACTERIA None seen 04/13/2023    Pertinent Imaging:  Results for orders placed during the hospital encounter of 03/15/06  DG Abd 1 View  Narrative Clinical Data: Low back pain, question kidney stone. ABDOMEN - 1 VIEW: Findings: No unexpected radiopaque densities are seen over the renal shadows or expected course of the ureters or urinary bladder to suggest kidney stones. Bowel gas pattern is unremarkable. There is no focal bony abnormality.  Impression Negative for evidence of urinary tract calculi.   Provider: Lauris Poag  No results found for this or any previous visit.  No results found for this or any previous visit.  No results found for this or any previous visit.  No results found for this or any previous visit.  No results found for this or any previous visit.  No results found for this or any previous visit.  Results for orders placed during the hospital encounter of 08/27/18  CT Renal Stone Study  Narrative CLINICAL DATA:  Initial evaluation for intermittent left-sided flank pain for 3 weeks.  EXAM: CT ABDOMEN AND PELVIS WITHOUT CONTRAST  TECHNIQUE: Multidetector CT imaging of the abdomen and pelvis was performed following the standard protocol without IV contrast.  COMPARISON:  Prior CT from 01/08/2018.  FINDINGS: Lower chest: Visualized lung bases are clear. Small calcified lymph node noted associated with a fat containing hiatal hernia.  Hepatobiliary:  Limited noncontrast evaluation of the liver is unremarkable. Gallbladder surgically absent. No biliary dilatation. Partially calcified porta hepatis node measuring 13 mm noted, similar to previous.  Pancreas: Irregular somewhat bilobed and partially calcified lesion at the pancreatic tail again seen, grossly similar to prior CT measuring up to approximately 2.8 cm, not well delineated on this noncontrast examination. Pancreatic duct mildly dilated up to 6 mm at the level of the mid pancreatic body. Remainder of the pancreas is normal in appearance.  Spleen: Spleen is normal.  Adrenals/Urinary Tract: Adrenal glands within normal limits. Kidneys equal in size without hydronephrosis. There are a few small punctate nonobstructive right renal nephrolithiasis involving the mid and upper pole. No radiopaque calculi seen along the course of either renal collecting system. No hydroureter. Partially distended bladder within normal limits. No layering stones within the bladder lumen.  Stomach/Bowel: Hiatal hernia containing mostly fat noted. Stomach within normal limits. No evidence for bowel obstruction. Colonic diverticulosis without overt evidence for acute diverticulitis. No acute inflammatory changes seen about the bowels.  Vascular/Lymphatic: Intra-abdominal aorta of normal caliber. Mild aorto bi-iliac atherosclerotic disease. No other adenopathy within the abdomen and pelvis.  Reproductive: Prostate mildly enlarged measuring 5.4 cm in diameter.  Other: Small bilateral fat containing inguinal hernias noted. No free air or fluid.  Musculoskeletal: No acute osseous abnormality. No discrete lytic or blastic osseous lesions. 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  IMPRESSION: 1. Punctate nonobstructive right renal nephrolithiasis. No left-sided renal calculi or evidence for obstructive uropathy. 2. Colonic diverticulosis without overt evidence  for acute diverticulitis. 3. Grossly similar appearance of irregular partially calcified lesion at the  pancreatic tail. Finding is indeterminate, and could reflect changes related to previous/chronic pancreatitis or possibly a pancreatic neoplasm. Again, follow-up examination with nonemergent abdominal MRI recommended for complete evaluation. 4. 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   Electronically Signed By: Rise Mu M.D. On: 08/27/2018 04:58   Assessment & Plan:    1. BPH with urinary obstruction (Primary) Uroxatral 10mg  daily and tadalafil 5mg  daily - Urinalysis, Routine w reflex microscopic  2. Erectile dysfunction due to arterial insufficiency -tadalafil prn  3. Nocturia Uroxatral 10mg  daily   No follow-ups on file.  Wilkie Aye, MD  [facility]

## 2023-09-21 NOTE — Telephone Encounter (Signed)
Received media stating needed further information on this prior auth but when I ran a test claim: The current 30 day co-pay is, $100.  No PA needed at this time. This test claim was processed through [facility]- copay amounts may vary at other pharmacies due to pharmacy/plan contracts, or as the patient moves through the different stages of their insurance plan.     I called [employer] and answered the questions that they asked and I asked them if this really needs a prior auth since I got a test claim to go through. They said they submitted the case under [id number] case number and said we should receive answer on this case in a couple of days. I called [facility] to see if they could get the claim to go through and they were able to get the claim to go. The prescription is being processed now

## 2023-09-22 LAB — MICROSCOPIC EXAMINATION
Bacteria, UA: NONE SEEN
RBC, Urine: NONE SEEN /[HPF] (ref 0–2)

## 2023-09-22 LAB — URINALYSIS, ROUTINE W REFLEX MICROSCOPIC
Bilirubin, UA: NEGATIVE
Glucose, UA: NEGATIVE
Ketones, UA: NEGATIVE
Leukocytes,UA: NEGATIVE
Nitrite, UA: NEGATIVE
RBC, UA: NEGATIVE
Specific Gravity, UA: 1.02 (ref 1.005–1.030)
Urobilinogen, Ur: 0.2 mg/dL (ref 0.2–1.0)
pH, UA: 6 (ref 5.0–7.5)

## 2023-09-23 ENCOUNTER — Encounter: Payer: Self-pay | Admitting: Urology

## 2023-09-23 NOTE — Patient Instructions (Signed)

## 2023-09-23 NOTE — Telephone Encounter (Signed)
Pharmacy Patient Advocate Encounter  Received notification from [facility] [employer] that Prior Authorization for Icosapent 1gm has been APPROVED from 09/22/23 to 08/22/24

## 2023-09-27 ENCOUNTER — Other Ambulatory Visit: Payer: Self-pay | Admitting: Cardiology

## 2023-09-28 ENCOUNTER — Encounter (INDEPENDENT_AMBULATORY_CARE_PROVIDER_SITE_OTHER): Payer: Self-pay | Admitting: Gastroenterology

## 2023-09-28 ENCOUNTER — Encounter: Payer: Self-pay | Admitting: Cardiology

## 2023-09-28 MED ORDER — LOSARTAN POTASSIUM 50 MG PO TABS: 50.0000 mg | ORAL_TABLET | Freq: Every day | ORAL | 0 refills | Status: DC

## 2023-09-28 MED ORDER — LOSARTAN POTASSIUM 50 MG PO TABS: 50.0000 mg | ORAL_TABLET | Freq: Every day | ORAL | 4 refills | Status: DC

## 2023-09-28 NOTE — Telephone Encounter (Signed)
 Patient identification verified by 2 forms. [name] Lucky, RN    Called and spoke to patient  Patient accept 6/18 OV at 1pm with Dr. Lavonne Prairie in [place]  Patient aware of 5/23 appointment with Dr. Maximo Spar  Patient has no further questions at this time

## 2023-09-29 MED ORDER — LOSARTAN POTASSIUM 50 MG PO TABS: 50.0000 mg | ORAL_TABLET | Freq: Every day | ORAL | 4 refills | Status: DC

## 2023-09-29 NOTE — Addendum Note (Signed)
 Addended by: Hilton Lucky on: 09/29/2023 09:00 AM   Modules accepted: Orders

## 2023-09-30 ENCOUNTER — Encounter (HOSPITAL_BASED_OUTPATIENT_CLINIC_OR_DEPARTMENT_OTHER): Payer: Self-pay | Admitting: Internal Medicine

## 2023-09-30 NOTE — Telephone Encounter (Signed)
 Pt sent my chart message. Called patient and rescheduled him to 3/4/25at 10:45am. Will mail new instructions once pre op is received.

## 2023-10-05 ENCOUNTER — Ambulatory Visit: Payer: Medicare Other | Admitting: Urology

## 2023-10-06 ENCOUNTER — Encounter: Payer: Self-pay | Admitting: Urology

## 2023-10-07 ENCOUNTER — Encounter (INDEPENDENT_AMBULATORY_CARE_PROVIDER_SITE_OTHER): Payer: Self-pay | Admitting: *Deleted

## 2023-10-07 NOTE — Telephone Encounter (Signed)
Please see pt concern below

## 2023-10-11 ENCOUNTER — Other Ambulatory Visit: Payer: Self-pay | Admitting: Internal Medicine

## 2023-10-11 DIAGNOSIS — E782 Mixed hyperlipidemia: Secondary | ICD-10-CM

## 2023-10-11 MED ORDER — EZETIMIBE 10 MG PO TABS: 10.0000 mg | ORAL_TABLET | Freq: Every day | ORAL | 0 refills | Status: DC

## 2023-10-21 ENCOUNTER — Other Ambulatory Visit: Payer: Self-pay

## 2023-10-21 ENCOUNTER — Encounter (HOSPITAL_COMMUNITY)
Admission: RE | Admit: 2023-10-21 | Discharge: 2023-10-21 | Disposition: A | Discharge status: Home / Self Care | Payer: PPO | Source: Ambulatory Visit | Attending: Gastroenterology | Admitting: Gastroenterology

## 2023-10-21 ENCOUNTER — Encounter (HOSPITAL_COMMUNITY): Payer: Self-pay

## 2023-10-25 ENCOUNTER — Encounter (HOSPITAL_COMMUNITY): Payer: Self-pay | Admitting: Gastroenterology

## 2023-10-25 ENCOUNTER — Ambulatory Visit (HOSPITAL_COMMUNITY): Admitting: Anesthesiology

## 2023-10-25 ENCOUNTER — Encounter (HOSPITAL_COMMUNITY)
Admission: RE | Disposition: A | Discharge status: Home / Self Care | Payer: Self-pay | Source: Home / Self Care | Attending: Gastroenterology

## 2023-10-25 ENCOUNTER — Ambulatory Visit (HOSPITAL_COMMUNITY)
Admission: RE | Admit: 2023-10-25 | Discharge: 2023-10-25 | Disposition: A | Discharge status: Home / Self Care | Payer: PPO | Attending: Gastroenterology | Admitting: Gastroenterology

## 2023-10-25 ENCOUNTER — Ambulatory Visit (HOSPITAL_BASED_OUTPATIENT_CLINIC_OR_DEPARTMENT_OTHER): Admitting: Anesthesiology

## 2023-10-25 DIAGNOSIS — Z955 Presence of coronary angioplasty implant and graft: Secondary | ICD-10-CM | POA: Diagnosis not present

## 2023-10-25 DIAGNOSIS — K2270 Barrett's esophagus without dysplasia: Secondary | ICD-10-CM | POA: Diagnosis not present

## 2023-10-25 DIAGNOSIS — F419 Anxiety disorder, unspecified: Secondary | ICD-10-CM | POA: Diagnosis not present

## 2023-10-25 DIAGNOSIS — E1143 Type 2 diabetes mellitus with diabetic autonomic (poly)neuropathy: Secondary | ICD-10-CM | POA: Insufficient documentation

## 2023-10-25 DIAGNOSIS — E119 Type 2 diabetes mellitus without complications: Secondary | ICD-10-CM | POA: Diagnosis not present

## 2023-10-25 DIAGNOSIS — K449 Diaphragmatic hernia without obstruction or gangrene: Secondary | ICD-10-CM | POA: Diagnosis not present

## 2023-10-25 DIAGNOSIS — R1013 Epigastric pain: Secondary | ICD-10-CM | POA: Insufficient documentation

## 2023-10-25 DIAGNOSIS — Z794 Long term (current) use of insulin: Secondary | ICD-10-CM | POA: Insufficient documentation

## 2023-10-25 DIAGNOSIS — K3184 Gastroparesis: Secondary | ICD-10-CM | POA: Diagnosis not present

## 2023-10-25 DIAGNOSIS — Z7902 Long term (current) use of antithrombotics/antiplatelets: Secondary | ICD-10-CM | POA: Diagnosis not present

## 2023-10-25 DIAGNOSIS — K219 Gastro-esophageal reflux disease without esophagitis: Secondary | ICD-10-CM | POA: Insufficient documentation

## 2023-10-25 DIAGNOSIS — I10 Essential (primary) hypertension: Secondary | ICD-10-CM | POA: Insufficient documentation

## 2023-10-25 DIAGNOSIS — D45 Polycythemia vera: Secondary | ICD-10-CM | POA: Diagnosis not present

## 2023-10-25 DIAGNOSIS — F32A Depression, unspecified: Secondary | ICD-10-CM | POA: Insufficient documentation

## 2023-10-25 DIAGNOSIS — I25119 Atherosclerotic heart disease of native coronary artery with unspecified angina pectoris: Secondary | ICD-10-CM | POA: Insufficient documentation

## 2023-10-25 DIAGNOSIS — Z7984 Long term (current) use of oral hypoglycemic drugs: Secondary | ICD-10-CM | POA: Diagnosis not present

## 2023-10-25 DIAGNOSIS — E1151 Type 2 diabetes mellitus with diabetic peripheral angiopathy without gangrene: Secondary | ICD-10-CM | POA: Diagnosis not present

## 2023-10-25 HISTORY — PX: ESOPHAGOGASTRODUODENOSCOPY (EGD) WITH PROPOFOL: SHX5813

## 2023-10-25 LAB — GLUCOSE, CAPILLARY: Glucose-Capillary: 252 mg/dL — ABNORMAL HIGH (ref 70–99)

## 2023-10-25 SURGERY — ESOPHAGOGASTRODUODENOSCOPY (EGD) WITH PROPOFOL
Anesthesia: General

## 2023-10-25 MED ORDER — FENTANYL CITRATE PF 50 MCG/ML IJ SOSY
PREFILLED_SYRINGE | INTRAMUSCULAR | Status: AC
  Filled 2023-10-25: qty 1

## 2023-10-25 MED ORDER — LIDOCAINE HCL (PF) 2 % IJ SOLN
INTRAMUSCULAR | Status: DC | PRN
  Administered 2023-10-25: 100 mg via INTRADERMAL

## 2023-10-25 MED ORDER — LACTATED RINGERS IV SOLN: INTRAVENOUS | Status: DC

## 2023-10-25 MED ORDER — FENTANYL CITRATE (PF) 100 MCG/2ML IJ SOLN
50.0000 ug | INTRAMUSCULAR | Status: DC | PRN
  Administered 2023-10-25: 50 ug via INTRAVENOUS

## 2023-10-25 MED ORDER — PROPOFOL 10 MG/ML IV BOLUS
INTRAVENOUS | Status: DC | PRN
  Administered 2023-10-25: 150 mg via INTRAVENOUS

## 2023-10-25 MED ORDER — PROPOFOL 500 MG/50ML IV EMUL
INTRAVENOUS | Status: DC | PRN
Start: 2023-10-25 — End: 2023-10-25
  Administered 2023-10-25: 250 ug/kg/min via INTRAVENOUS

## 2023-10-25 NOTE — H&P (Signed)
 [name] is an 63 y.o. male.   Chief Complaint: abdominal pain, nausea, vomiting and Barrett's esophagus HPI: 63 year old male with past medical history of gastroparesis, diabetes, polycythemia vera, coronary artery disease status post stent placement, hypertension, GERD complicated by Barrett's esophagus, coming for evaluation of epigastric, left abdominal pain and Barrett's esophagus.  Patient reports that he has had relatively adequate control of his reflux as he has seldom heartburn but he has presented some episodes of nausea and abdominal pain in the upper abdomen.  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  Patent coronary bypass grafts with a free radial graft to PDA and LIMA to the  diag.   Diabetes mellitus    Diabetic peripheral neuropathy (HCC) 05/06/2020   Dyslipidemia    Poorly controlled, probably related to his DM (Some of this is secondary to his inability to afford medications).   ED (erectile dysfunction)    Esophageal yeast infection (HCC)    Gastroparesis    Hypertension    Metaplasia of esophagus    Pancreatitis chronic    Pseudocyst   Polycythemia vera(238.4) 04/26/2012   PONV (postoperative nausea and vomiting)     Past Surgical History:  Procedure Laterality Date   APPENDECTOMY     1980   BIOPSY  11/11/2021   Procedure: BIOPSY;  Surgeon: Malissa Hippo, MD;  Location: [facility];  Service: Endoscopy;;   CARDIAC CATHETERIZATION     CHOLECYSTECTOMY     COLONOSCOPY  07/31/1985   COLONOSCOPY N/A 03/11/2016   Procedure: COLONOSCOPY;  Surgeon: Malissa Hippo, MD;  Location: [facility];  Service: Endoscopy;  Laterality: N/A;   COLONOSCOPY WITH PROPOFOL N/A 11/11/2021   Procedure: COLONOSCOPY WITH PROPOFOL;  Surgeon: Malissa Hippo, MD;   Location: [facility];  Service: Endoscopy;  Laterality: N/A;  205   CORONARY ANGIOPLASTY WITH STENT PLACEMENT  08/23/2004   CORONARY ARTERY BYPASS GRAFT  11/24/2004   ESOPHAGOGASTRODUODENOSCOPY  09/19/2001   ESOPHAGOGASTRODUODENOSCOPY N/A 03/11/2016   Procedure: ESOPHAGOGASTRODUODENOSCOPY (EGD);  Surgeon: Malissa Hippo, MD;  Location: [facility];  Service: Endoscopy;  Laterality: N/A;  12:00   ESOPHAGOGASTRODUODENOSCOPY (EGD) WITH PROPOFOL N/A 11/11/2021   Procedure: ESOPHAGOGASTRODUODENOSCOPY (EGD) WITH PROPOFOL;  Surgeon: Malissa Hippo, MD;  Location: [facility];  Service: Endoscopy;  Laterality: N/A;   FINGER TENDON REPAIR Bilateral    in middle finger   KNEE SURGERY     left knee   NASAL SEPTUM SURGERY     POLYPECTOMY  11/11/2021   Procedure: POLYPECTOMY;  Surgeon: Malissa Hippo, MD;  Location: [facility];  Service: Endoscopy;;    Family History  Problem Relation Age of Onset   Coronary artery disease Mother        strong family history of   Heart attack Mother        dying of (at age 28)   Diabetes Mother    Diabetes Sister    Diabetes Brother    Pancreatitis Brother    Healthy Daughter    Hyperlipidemia Daughter    Heart disease Brother    Hypertension Brother    Hyperlipidemia Brother    Social History:  reports that he has never smoked.  He has never used smokeless tobacco. He reports that he does not currently use alcohol. He reports that he does not use drugs.  Allergies:  Allergies  Allergen Reactions   Iodinated Contrast Media Hives, Itching and Nausea Only    Flushing, Burning, itching Other reaction(s): Angioedema (ALLERGY/intolerance)    Iohexol Hives and Shortness Of Breath     Desc: HIVES,SOB NEEDS PREP.MEDS    Metoclopramide Hcl Hives    Patient became very spastic   Alfuzosin Hcl Er     Dropped blood pressure   Sulfonamide Derivatives Hives    Large hives   Enalapril Cough   Penicillins Rash    Has patient had a PCN reaction  causing immediate rash, facial/tongue/throat swelling, SOB or lightheadedness with hypotension: Yes Has patient had a PCN reaction causing severe rash involving mucus membranes or skin necrosis: No Has patient had a PCN reaction that required hospitalization No Has patient had a PCN reaction occurring within the last 10 years: No. If all of the above answers are "NO", then may proceed with Cephalosporin use.    Sulfa Antibiotics Rash    Other Reaction: Not Assessed    Medications Prior to Admission  Medication Sig Dispense Refill   Cholecalciferol 125 MCG (5000 UT) TABS Take 10,000 Units by mouth daily.     Continuous Blood Gluc Receiver (DEXCOM G7 RECEIVER) DEVI USE TO CHECK GLUCOSE AS NEEDED 1 each 0   Continuous Blood Gluc Sensor (DEXCOM G7 SENSOR) MISC CHANGE SENSOR EVERY 10 DAYS 3 each 2   diazepam (VALIUM) 10 MG tablet Take 0.5 tablets (5 mg total) by mouth at bedtime as needed for anxiety or sleep. 30 tablet 0   DULoxetine (CYMBALTA) 60 MG capsule Take 1 capsule (60 mg total) by mouth daily. 90 capsule 1   ezetimibe (ZETIA) 10 MG tablet Take 1 tablet (10 mg total) by mouth daily. 90 tablet 0   gabapentin (NEURONTIN) 300 MG capsule TAKE (2) CAPSULES BY MOUTH (3) TIMES DAILY. 540 capsule 0   glucose blood (FREESTYLE TEST STRIPS) test strip Use as instructed 100 each 12   hydrochlorothiazide (HYDRODIURIL) 25 MG tablet TAKE 1 TABLET BY MOUTH IN THE MORNING 90 tablet 1   HYDROcodone-acetaminophen (NORCO) 10-325 MG tablet Take 1 tablet by mouth every 6 (six) hours as needed for severe pain (pain score 7-10). 120 tablet 0   icosapent Ethyl (VASCEPA) 1 g capsule Take 2 capsules (2 g total) by mouth 2 (two) times daily. 120 capsule 11   insulin NPH-regular Human (NOVOLIN 70/30) (70-30) 100 UNIT/ML injection Inject 60 Units into the skin 2 (two) times daily before a meal. When glucose is above 90 and eating     lansoprazole (PREVACID) 30 MG capsule Take 1 capsule (30 mg total) by mouth 2 (two)  times daily before a meal. 180 capsule 1   losartan (COZAAR) 50 MG tablet Take 1 tablet (50 mg total) by mouth daily. 30 tablet 4   metFORMIN (GLUCOPHAGE-XR) 500 MG 24 hr tablet Take 1 tablet (500 mg total) by mouth 2 (two) times daily with a meal. 180 tablet 1   metoprolol tartrate (LOPRESSOR) 25 MG tablet TAKE 1 TABLET BY MOUTH TWICE DAILY 180 tablet 1   ondansetron (ZOFRAN) 4 MG tablet Take 1 tablet (4 mg total) by mouth every 8 (eight) hours as needed for nausea or vomiting. 30 tablet 0   polyethylene glycol (MIRALAX / GLYCOLAX) 17 g packet Take 17 g by mouth daily as needed for moderate constipation.  tadalafil (CIALIS) 5 MG tablet Take 1 tablet (5 mg total) by mouth daily as needed for erectile dysfunction. 30 tablet 11   alfuzosin (UROXATRAL) 10 MG 24 hr tablet Take 1 tablet (10 mg total) by mouth at bedtime. 30 tablet 11   clopidogrel (PLAVIX) 75 MG tablet TAKE 1 TABLET BY MOUTH ONCE DAILY as a blood thinner 180 tablet 2   methocarbamol (ROBAXIN) 500 MG tablet Take 1 tablet (500 mg total) by mouth every 8 (eight) hours as needed for muscle spasms. 30 tablet 0   nitroGLYCERIN (NITROSTAT) 0.4 MG SL tablet Place 0.4 mg under the tongue every 5 (five) minutes as needed for chest pain. Reported on 02/26/2016     NONFORMULARY OR COMPOUNDED ITEM Apply 1 application. topically daily as needed (neuropathy). Compounded cream for foot pain. Gets at [employer].     tadalafil (CIALIS) 20 MG tablet Take 1 tablet (20 mg total) by mouth daily as needed. 30 tablet 5    Results for orders placed or performed during the hospital encounter of 10/25/23 (from the past 48 hours)  Glucose, capillary     Status: Abnormal   Collection Time: 10/25/23 10:04 AM  Result Value Ref Range   Glucose-Capillary 252 (H) 70 - 99 mg/dL    Comment: Glucose reference range applies only to samples taken after fasting for at least 8 hours.   No results found.  Review of Systems  Gastrointestinal:  Positive for abdominal  pain.  All other systems reviewed and are negative.   Blood pressure (!) 138/95, pulse 62, temperature 98.2 F (36.8 C), temperature source Oral, resp. rate 13, height 5\' 9"  (1.753 m), weight 75.1 kg, SpO2 99%. Physical Exam  GENERAL: The patient is AO x3, in no acute distress. HEENT: Head is normocephalic and atraumatic. EOMI are intact. Mouth is well hydrated and without lesions. NECK: Supple. No masses LUNGS: Clear to auscultation. No presence of rhonchi/wheezing/rales. Adequate chest expansion HEART: RRR, normal s1 and s2. ABDOMEN: Soft, nontender, no guarding, no peritoneal signs, and nondistended. BS +. No masses. EXTREMITIES: Without any cyanosis, clubbing, rash, lesions or edema. NEUROLOGIC: AOx3, no focal motor deficit. SKIN: no jaundice, no rashes  Assessment/Plan 63 year old male with past medical history of gastroparesis, diabetes, polycythemia vera, coronary artery disease status post stent placement, hypertension, GERD complicated by Barrett's esophagus, coming for evaluation of epigastric, left abdominal pain and Barrett's esophagus.  We will proceed with EGD.  Dolores Frame, MD 10/25/2023, 11:44 AM

## 2023-10-25 NOTE — Transfer of Care (Signed)
 Immediate Anesthesia Transfer of Care Note  Patient: [name]  Procedure(s) Performed: ESOPHAGOGASTRODUODENOSCOPY (EGD) WITH PROPOFOL  Patient Location: Short Stay  Anesthesia Type:General  Level of Consciousness: drowsy  Airway & Oxygen Therapy: Patient Spontanous Breathing  Post-op Assessment: Report given to RN and Post -op Vital signs reviewed and stable  Post vital signs: Reviewed and stable  Last Vitals:  Vitals Value Taken Time  BP    Temp    Pulse    Resp    SpO2      Last Pain:  Vitals:   10/25/23 1152  TempSrc:   PainSc: 0-No pain         Complications: No notable events documented.

## 2023-10-25 NOTE — OR Nursing (Signed)
 Complaining of pain to both feet .  Rated pain 9 , stated he did not take any meds this morning for neurrothpy.

## 2023-10-25 NOTE — Anesthesia Procedure Notes (Signed)
 Date/Time: 10/25/2023 11:51 AM  Performed by: Julian Reil, CRNAPre-anesthesia Checklist: Patient identified, Emergency Drugs available, Suction available and Patient being monitored Patient Re-evaluated:Patient Re-evaluated prior to induction Oxygen Delivery Method: Nasal cannula Induction Type: IV induction Placement Confirmation: positive ETCO2 Comments: Optiflow High Flow [place] O2 used.

## 2023-10-25 NOTE — Op Note (Signed)
 [facility] Patient Name: [name] Procedure Date: 10/25/2023 11:45 AM MRN: [id number] Date of Birth: 1961-01-04 Attending MD: [name] , , [id number] CSN: [id number] Age: 63 Admit Type: Outpatient Procedure:                Upper GI endoscopy Indications:              Epigastric abdominal pain Providers:                [name], Crystal Page, Volanda Napoleon., Technician Referring MD:              Medicines:                Monitored Anesthesia Care Complications:            No immediate complications. Estimated Blood Loss:     Estimated blood loss: none. Procedure:                Pre-Anesthesia Assessment:                           - Prior to the procedure, a History and Physical                            was performed, and patient medications, allergies                            and sensitivities were reviewed. The patient's                            tolerance of previous anesthesia was reviewed.                           - The risks and benefits of the procedure and the                            sedation options and risks were discussed with the                            patient. All questions were answered and informed                            consent was obtained.                           - ASA Grade Assessment: II - A patient with mild                            systemic disease.                           After obtaining informed consent, the endoscope was                            passed under direct vision. Throughout the  procedure, the patient's blood pressure, pulse, and                            oxygen saturations were monitored continuously. The                            [id number]-[id number] ([id number]) scope was introduced through the                            mouth, and advanced to the second part of duodenum.                            The upper GI endoscopy was accomplished without                             difficulty. The patient tolerated the procedure                            well. Scope In: 11:56:31 AM Scope Out: 12:05:07 PM Total Procedure Duration: 0 hours 8 minutes 36 seconds  Findings:      The esophagus and gastroesophageal junction were examined with white       light and narrow band imaging (NBI). There were esophageal mucosal       changes consistent with short-segment Barrett's esophagus, classified as       Barrett's stage C0-M2 per Prague criteria. These changes involved the       mucosa at the upper extent of the gastric folds (39 cm from the       incisors) extending to the Z-line (37 cm from the incisors). One tongue       of salmon-colored mucosa was present. The maximum longitudinal extent of       these esophageal mucosal changes was 2 cm in length. This was biopsied       with a cold forceps for histology, 2 jars.      The entire examined stomach was normal. Biopsies were taken with a cold       forceps for Helicobacter pylori testing.      The examined duodenum was normal. Impression:               - Esophageal mucosal changes consistent with                            short-segment Barrett's esophagus, classified as                            Barrett's stage C0-M2 per Prague criteria. Biopsied.                           - Normal stomach. Biopsied.                           - Normal examined duodenum. Moderate Sedation:      Per Anesthesia Care Recommendation:           - Discharge patient to home (ambulatory).                           -  Resume previous diet.                           - Await pathology results.                           - Increase domperidone use to at least once a day,                            can take 2-3 times per day if needed.                           - Restart Plavix today. Procedure Code(s):        --- Professional ---                           [phone], Esophagogastroduodenoscopy, flexible,                             transoral; with biopsy, single or multiple Diagnosis Code(s):        --- Professional ---                           K22.70, Barrett's esophagus without dysplasia                           R10.13, Epigastric pain CPT copyright 2022 American Medical Association. All rights reserved. The codes documented in this report are preliminary and upon coder review may  be revised to meet current compliance requirements. [name], MD [name],  10/25/2023 12:14:25 PM This report has been signed electronically. Number of Addenda: 0

## 2023-10-25 NOTE — Discharge Instructions (Signed)
 You are being discharged to home.  Resume your previous diet.  We are waiting for your pathology results.  Increase domperidone use to at least once a day, can take 2-3 times per day if needed. Restart Plavix today.

## 2023-10-25 NOTE — Anesthesia Preprocedure Evaluation (Addendum)
 Anesthesia Evaluation  Patient identified by MRN, date of birth, ID band Patient awake    Reviewed: Allergy & Precautions, H&P , NPO status , Patient's Chart, lab work & pertinent test results, reviewed documented beta blocker date and time   History of Anesthesia Complications (+) PONV and history of anesthetic complications  Airway Mallampati: II  TM Distance: >3 FB Neck ROM: full    Dental  (+) Dental Advisory Given, Chipped,    Pulmonary neg pulmonary ROS   Pulmonary exam normal breath sounds clear to auscultation       Cardiovascular Exercise Tolerance: Good hypertension, + angina  + CAD and + Peripheral Vascular Disease   Rhythm:regular Rate:Normal     Neuro/Psych  PSYCHIATRIC DISORDERS Anxiety Depression     Neuromuscular disease negative neurological ROS  negative psych ROS   GI/Hepatic negative GI ROS, Neg liver ROS,GERD  ,,  Endo/Other  diabetes    Renal/GU Renal diseasenegative Renal ROS  negative genitourinary   Musculoskeletal   Abdominal   Peds  Hematology negative hematology ROS (+) Blood dyscrasia, anemia   Anesthesia Other Findings   Reproductive/Obstetrics negative OB ROS                             Anesthesia Physical Anesthesia Plan  ASA: 3  Anesthesia Plan: General   Post-op Pain Management:    Induction:   PONV Risk Score and Plan: Propofol infusion  Airway Management Planned:   Additional Equipment:   Intra-op Plan:   Post-operative Plan:   Informed Consent: I have reviewed the patients History and Physical, chart, labs and discussed the procedure including the risks, benefits and alternatives for the proposed anesthesia with the patient or authorized representative who has indicated his/her understanding and acceptance.     Dental Advisory Given  Plan Discussed with: CRNA  Anesthesia Plan Comments:        Anesthesia Quick Evaluation

## 2023-10-25 NOTE — OR Nursing (Signed)
 Pain level now 2

## 2023-10-26 ENCOUNTER — Encounter (INDEPENDENT_AMBULATORY_CARE_PROVIDER_SITE_OTHER): Payer: Self-pay | Admitting: *Deleted

## 2023-10-26 ENCOUNTER — Encounter (HOSPITAL_COMMUNITY): Payer: Self-pay | Admitting: Gastroenterology

## 2023-10-26 LAB — SURGICAL PATHOLOGY

## 2023-10-29 NOTE — Anesthesia Postprocedure Evaluation (Signed)
 Anesthesia Post Note  Patient: [name]  Procedure(s) Performed: ESOPHAGOGASTRODUODENOSCOPY (EGD) WITH PROPOFOL  Patient location during evaluation: Phase II Anesthesia Type: General Level of consciousness: awake Pain management: pain level controlled Vital Signs Assessment: post-procedure vital signs reviewed and stable Respiratory status: spontaneous breathing and respiratory function stable Cardiovascular status: blood pressure returned to baseline and stable Postop Assessment: no headache and no apparent nausea or vomiting Anesthetic complications: no Comments: Late entry   No notable events documented.   Last Vitals:  Vitals:   10/25/23 0959 10/25/23 1213  BP: (!) 138/95 118/76  Pulse: 62 62  Resp: 13 13  Temp: 36.8 C 36.6 C  SpO2: 99% 96%    Last Pain:  Vitals:   10/25/23 1213  TempSrc: Oral  PainSc: 0-No pain                 Windell Norfolk

## 2023-10-31 ENCOUNTER — Ambulatory Visit (INDEPENDENT_AMBULATORY_CARE_PROVIDER_SITE_OTHER): Payer: Medicare Other | Admitting: Gastroenterology

## 2023-10-31 ENCOUNTER — Telehealth (INDEPENDENT_AMBULATORY_CARE_PROVIDER_SITE_OTHER): Payer: Self-pay | Admitting: Gastroenterology

## 2023-10-31 NOTE — Telephone Encounter (Signed)
 I called the patient to discuss findings, unfortunately he did not answer my call.  Left detailed voice message.  Explained that he was found to have Barrett's esophagus again but no dysplasia, normal gastric biopsies.  He will need to have a repeat endoscopy in 5 years.  Of note, the patient canceled his appointment today during the morning time.

## 2023-10-31 NOTE — Telephone Encounter (Signed)
 Pt aware of appt changes and also was asking about his recent results. Please call [phone]

## 2023-11-01 NOTE — Telephone Encounter (Signed)
 I see, thanks for the clarification

## 2023-11-04 ENCOUNTER — Telehealth: Payer: Medicare Other | Admitting: Internal Medicine

## 2023-11-19 ENCOUNTER — Other Ambulatory Visit: Payer: Self-pay | Admitting: Internal Medicine

## 2023-11-19 DIAGNOSIS — E1142 Type 2 diabetes mellitus with diabetic polyneuropathy: Secondary | ICD-10-CM

## 2023-11-19 DIAGNOSIS — F339 Major depressive disorder, recurrent, unspecified: Secondary | ICD-10-CM

## 2023-11-24 ENCOUNTER — Encounter (INDEPENDENT_AMBULATORY_CARE_PROVIDER_SITE_OTHER): Payer: Self-pay | Admitting: Gastroenterology

## 2023-11-24 ENCOUNTER — Ambulatory Visit (INDEPENDENT_AMBULATORY_CARE_PROVIDER_SITE_OTHER): Admitting: Gastroenterology

## 2023-11-24 ENCOUNTER — Telehealth (INDEPENDENT_AMBULATORY_CARE_PROVIDER_SITE_OTHER): Payer: Self-pay | Admitting: Gastroenterology

## 2023-11-24 VITALS — BP 161/95 | HR 86 | Temp 97.7°F | Ht 69.5 in | Wt 177.6 lb

## 2023-11-24 DIAGNOSIS — K22719 Barrett's esophagus with dysplasia, unspecified: Secondary | ICD-10-CM

## 2023-11-24 DIAGNOSIS — K219 Gastro-esophageal reflux disease without esophagitis: Secondary | ICD-10-CM | POA: Diagnosis not present

## 2023-11-24 DIAGNOSIS — K869 Disease of pancreas, unspecified: Secondary | ICD-10-CM

## 2023-11-24 DIAGNOSIS — K3184 Gastroparesis: Secondary | ICD-10-CM | POA: Diagnosis not present

## 2023-11-24 NOTE — Patient Instructions (Addendum)
 Schedule MRCP Continue Prevacid 30 mg every day If abdominal pain recurs, [place] will need to take 1 pill every 8 hours as needed Repeat EGD in 5 years

## 2023-11-24 NOTE — Telephone Encounter (Signed)
 Pt has allergy to contrast media and is needing Prednisone sent to [facility]. Thank you.

## 2023-11-24 NOTE — Telephone Encounter (Signed)
 Can you please remind me what is the protocol for this? Thanks

## 2023-11-24 NOTE — Progress Notes (Signed)
 [name], M.D. Gastroenterology & Hepatology [facility] [address] [place], [place] [address]  Primary Care Physician: [name] Halon, MD [address] [place] [address]  I will communicate my assessment and recommendations to the referring MD via EMR.  Problems: GERD Barrett's esophagus Pancreatic serous cystadenoma Gastroparesis  History of Present Illness: [name] is a 63 y.o. male with history of GERD, short segment Barrett's esophagus overdue for surveillance, gastroparesis, history of pancreatitis due to hypertriglyceridemia, chronic pancreatitis, serous cystadenoma of the pancreas, polycythemia, coronary artery disease  s/p stent placement, CKD, diabetes ,who presents for follow up of pancreatic serous cystadenoma, gastroparesis, GERD and Barrett's esophagus.  The patient was last seen on 08/12/2023. At that time, the patient was scheduled for EGD with finding described low.  He was also advised to increase Prevacid to 30 mg twice daily.  CBC, CMP and lipase were checked which were within normal limits.  He was advised to resume domperidone and to take 4-6 small meals per day.  Patient reports that after his EGD he started eating more plain yogurt, which helped relieving the pain.   He reports that he ran out of domperidone. He has some issues with his current financial funds as he had his bank account locked due to some issues with [employer]. Has not been able to reorder it due to this.  The patient denies having any recurrent nausea, vomiting, fever, chills, hematochezia, melena, hematemesis, abdominal distention, abdominal pain, diarrhea, jaundice, pruritus or weight loss. Has had regular bowel movements recently.  He is now taking Prevacid 30 mg qday and does not have heartburn or odynophagia/dysphagia. Has changed some of his diet which has helped.  Most recent abdominal MRI 09/2022 Similar size of the  complex cystic lesion in the pancreatic tail  now measuring 2.8 cm, previously 3.0 cm. No new suspicious enhancing  soft tissue nodularity or mural/septal thickening identified  although evaluation of the lesion is again limited by respiratory  motion. Findings are most compatible with a serous cystadenoma of  the pancreas. However, given the poor evaluation of this lesion  secondary to respiratory motion would suggest follow-up MRCP with  and without contrast in 1 year.   Last EGD: 10/25/2023 Short segment Barrett's esophagus extending 2 cm in length, C0- M2 per [place] criteria.  (Barrett's esophagus without dysplasia).  Normal stomach (normal stomach without H. pylori), normal duodenum.  Recommend a repeat EGD in 5 years  Last Colonoscopy: 10/2021 Diverticulosis Small rectosigmoid polyp (Path TA)   Repeat in 5 years.   Past Medical History: Past Medical History:  Diagnosis Date   Anemia    Bypass graft stenosis (HCC)    '06   Cancer (HCC)    Polycythemia vera   Cervical spinal stenosis 05/06/2020   C5-6 level   Chronic gastritis    Coronary artery disease    Patent Stent to Circ.  Patent coronary bypass grafts with a free radial graft to PDA and LIMA to the  diag.   Diabetes mellitus    Diabetic peripheral neuropathy (HCC) 05/06/2020   Dyslipidemia    Poorly controlled, probably related to his DM (Some of this is secondary to his inability to afford medications).   ED (erectile dysfunction)    Esophageal yeast infection (HCC)    Gastroparesis    Hypertension    Metaplasia of esophagus    Pancreatitis chronic    Pseudocyst   Polycythemia vera(238.4) 04/26/2012   PONV (  postoperative nausea and vomiting)     Past Surgical History: Past Surgical History:  Procedure Laterality Date   APPENDECTOMY     1980   BIOPSY  11/11/2021   Procedure: BIOPSY;  Surgeon: Malissa Hippo, MD;  [facility]: [facility];  Service: Endoscopy;;   CARDIAC CATHETERIZATION      CHOLECYSTECTOMY     COLONOSCOPY  07/31/1985   COLONOSCOPY N/A 03/11/2016   Procedure: COLONOSCOPY;  Surgeon: Malissa Hippo, MD;  [facility]: [facility];  Service: Endoscopy;  Laterality: N/A;   COLONOSCOPY WITH PROPOFOL N/A 11/11/2021   Procedure: COLONOSCOPY WITH PROPOFOL;  Surgeon: Malissa Hippo, MD;  [facility]: [facility];  Service: Endoscopy;  Laterality: N/A;  205   CORONARY ANGIOPLASTY WITH STENT PLACEMENT  08/23/2004   CORONARY ARTERY BYPASS GRAFT  11/24/2004   ESOPHAGOGASTRODUODENOSCOPY  09/19/2001   ESOPHAGOGASTRODUODENOSCOPY N/A 03/11/2016   Procedure: ESOPHAGOGASTRODUODENOSCOPY (EGD);  Surgeon: Malissa Hippo, MD;  [facility];  Service: Endoscopy;  Laterality: N/A;  12:00   ESOPHAGOGASTRODUODENOSCOPY (EGD) WITH PROPOFOL N/A 11/11/2021   Procedure: ESOPHAGOGASTRODUODENOSCOPY (EGD) WITH PROPOFOL;  Surgeon: Malissa Hippo, MD;  [facility];  Service: Endoscopy;  Laterality: N/A;   ESOPHAGOGASTRODUODENOSCOPY (EGD) WITH PROPOFOL N/A 10/25/2023   Procedure: ESOPHAGOGASTRODUODENOSCOPY (EGD) WITH PROPOFOL;  Surgeon: Dolores Frame, MD;  [facility]: [facility];  Service: Gastroenterology;  Laterality: N/A;  10:45am;asa 3   FINGER TENDON REPAIR Bilateral    in middle finger   KNEE SURGERY     left knee   NASAL SEPTUM SURGERY     POLYPECTOMY  11/11/2021   Procedure: POLYPECTOMY;  Surgeon: Malissa Hippo, MD;  [facility];  Service: Endoscopy;;    Family History: Family History  Problem Relation Age of Onset   Coronary artery disease Mother        strong family history of   Heart attack Mother        dying of (at age 39)   Diabetes Mother    Diabetes Sister    Diabetes Brother    Pancreatitis Brother    Healthy Daughter    Hyperlipidemia Daughter    Heart disease Brother    Hypertension Brother    Hyperlipidemia Brother     Social History: Social History   Tobacco Use  Smoking Status Never  Smokeless Tobacco  Never   Social History   Substance and Sexual Activity  Alcohol Use Not Currently   Social History   Substance and Sexual Activity  Drug Use No    Allergies: Allergies  Allergen Reactions   Iodinated Contrast Media Hives, Itching and Nausea Only    Flushing, Burning, itching Other reaction(s): Angioedema (ALLERGY/intolerance)    Iohexol Hives and Shortness Of Breath     Desc: HIVES,SOB NEEDS PREP.MEDS    Metoclopramide Hcl Hives    Patient became very spastic   Alfuzosin Hcl Er     Dropped blood pressure   Sulfonamide Derivatives Hives    Large hives   Enalapril Cough   Penicillins Rash    Has patient had a PCN reaction causing immediate rash, facial/tongue/throat swelling, SOB or lightheadedness with hypotension: Yes Has patient had a PCN reaction causing severe rash involving mucus membranes or skin necrosis: No Has patient had a PCN reaction that required hospitalization No Has patient had a PCN reaction occurring within the last 10 years: No. If all of the above answers are "NO", then may proceed with Cephalosporin use.  Sulfa Antibiotics Rash    Other Reaction: Not Assessed    Medications: Current Outpatient Medications  Medication Sig Dispense Refill   Cholecalciferol 125 MCG (5000 UT) TABS Take 10,000 Units by mouth daily.     clopidogrel (PLAVIX) 75 MG tablet TAKE 1 TABLET BY MOUTH ONCE DAILY as a blood thinner 180 tablet 2   Continuous Blood Gluc Receiver (DEXCOM G7 RECEIVER) DEVI USE TO CHECK GLUCOSE AS NEEDED 1 each 0   Continuous Blood Gluc Sensor (DEXCOM G7 SENSOR) MISC CHANGE SENSOR EVERY 10 DAYS 3 each 2   diazepam (VALIUM) 10 MG tablet Take 0.5 tablets (5 mg total) by mouth at bedtime as needed for anxiety or sleep. 30 tablet 0   DULoxetine (CYMBALTA) 60 MG capsule TAKE ONE CAPSULE BY MOUTH DAILY 90 capsule 1   ezetimibe (ZETIA) 10 MG tablet Take 1 tablet (10 mg total) by mouth daily. 90 tablet 0   gabapentin (NEURONTIN) 300 MG capsule TAKE (2)  CAPSULES BY MOUTH (3) TIMES DAILY. 540 capsule 0   glucose blood (FREESTYLE TEST STRIPS) test strip Use as instructed 100 each 12   hydrochlorothiazide (HYDRODIURIL) 25 MG tablet TAKE 1 TABLET BY MOUTH IN THE MORNING 90 tablet 1   HYDROcodone-acetaminophen (NORCO) 10-325 MG tablet Take 1 tablet by mouth every 6 (six) hours as needed for severe pain (pain score 7-10). 120 tablet 0   icosapent Ethyl (VASCEPA) 1 g capsule Take 2 capsules (2 g total) by mouth 2 (two) times daily. 120 capsule 11   insulin NPH-regular Human (NOVOLIN 70/30) (70-30) 100 UNIT/ML injection Inject 60 Units into the skin 2 (two) times daily before a meal. When glucose is above 90 and eating     lansoprazole (PREVACID) 30 MG capsule Take 1 capsule (30 mg total) by mouth 2 (two) times daily before a meal. 180 capsule 1   losartan (COZAAR) 50 MG tablet Take 1 tablet (50 mg total) by mouth daily. 30 tablet 4   metFORMIN (GLUCOPHAGE-XR) 500 MG 24 hr tablet Take 1 tablet (500 mg total) by mouth 2 (two) times daily with a meal. 180 tablet 1   methocarbamol (ROBAXIN) 500 MG tablet Take 1 tablet (500 mg total) by mouth every 8 (eight) hours as needed for muscle spasms. 30 tablet 0   metoprolol tartrate (LOPRESSOR) 25 MG tablet TAKE 1 TABLET BY MOUTH TWICE DAILY 180 tablet 1   nitroGLYCERIN (NITROSTAT) 0.4 MG SL tablet Place 0.4 mg under the tongue every 5 (five) minutes as needed for chest pain. Reported on 02/26/2016     NONFORMULARY OR COMPOUNDED ITEM Apply 1 application. topically daily as needed (neuropathy). Compounded cream for foot pain. Gets at [employer].     ondansetron (ZOFRAN) 4 MG tablet Take 1 tablet (4 mg total) by mouth every 8 (eight) hours as needed for nausea or vomiting. 30 tablet 0   polyethylene glycol (MIRALAX / GLYCOLAX) 17 g packet Take 17 g by mouth daily as needed for moderate constipation.     tadalafil (CIALIS) 20 MG tablet Take 1 tablet (20 mg total) by mouth daily as needed. 30 tablet 5   tadalafil  (CIALIS) 5 MG tablet Take 1 tablet (5 mg total) by mouth daily as needed for erectile dysfunction. 30 tablet 11   No current facility-administered medications for this visit.    Review of Systems: GENERAL: negative for malaise, night sweats HEENT: No changes in hearing or vision, no nose bleeds or other nasal problems. NECK: Negative for lumps, goiter, pain and  significant neck swelling RESPIRATORY: Negative for cough, wheezing CARDIOVASCULAR: Negative for chest pain, leg swelling, palpitations, orthopnea GI: SEE HPI MUSCULOSKELETAL: Negative for joint pain or swelling, back pain, and muscle pain. SKIN: Negative for lesions, rash PSYCH: Negative for sleep disturbance, mood disorder and recent psychosocial stressors. HEMATOLOGY Negative for prolonged bleeding, bruising easily, and swollen nodes. ENDOCRINE: Negative for cold or heat intolerance, polyuria, polydipsia and goiter. NEURO: negative for tremor, gait imbalance, syncope and seizures. The remainder of the review of systems is noncontributory.   Physical Exam: BP (!) 177/95 (BP Location: Left Arm, Patient Position: Sitting, Cuff Size: Normal)   Pulse 90   Temp 97.7 F (36.5 C) (Temporal)   Ht 5' 9.5" (1.765 m)   Wt 177 lb 9.6 oz (80.6 kg)   BMI 25.85 kg/m  GENERAL: The patient is AO x3, in no acute distress. HEENT: Head is normocephalic and atraumatic. EOMI are intact. Mouth is well hydrated and without lesions. NECK: Supple. No masses LUNGS: Clear to auscultation. No presence of rhonchi/wheezing/rales. Adequate chest expansion HEART: RRR, normal s1 and s2. ABDOMEN: Soft, nontender, no guarding, no peritoneal signs, and nondistended. BS +. No masses. EXTREMITIES: Without any cyanosis, clubbing, rash, lesions or edema. NEUROLOGIC: AOx3, no focal motor deficit. SKIN: no jaundice, no rashes  Imaging/Labs: as above  I personally reviewed and interpreted the available labs, imaging and endoscopic files.  Impression and  Plan: [name] is a 63 y.o. male with history of GERD, short segment Barrett's esophagus overdue for surveillance, gastroparesis, history of pancreatitis due to hypertriglyceridemia, chronic pancreatitis, serous cystadenoma of the pancreas, polycythemia, coronary artery disease  s/p stent placement, CKD, diabetes ,who presents for follow up of pancreatic serous cystadenoma, gastroparesis, GERD and Barrett's esophagus.  Patient has presented significant improvement of his abdominal pain recently, even without taking domperidone. His most recent EGD did not show any abnormalities that would explain the abdominal pain he is presenting. I advised him that if his abdominal pain recurs, he may want to restart domperidone.  His GERD has been well controlled with Prevacid qday, which he can continue for now. Last surveillance EGD did not show dysplatic BE or ongoing inflammation. We will continue his management with same dosage PPI.  Finally, he has had a multicystic pancreatic lesion, which has had stable size and given characteristics has been considered to be a benign serous cystadenoma. However, most recent MRI was limited due to respiratory motion. Due to this, we will repeat MRCP - no need to continue surveillance if stable in size and without concerning imaging features.  -Schedule MRCP -Continue Prevacid 30 mg every day -If abdominal pain recurs, please order domperidone  , will need to take 1 pill every 8 hours as needed -Repeat EGD in 5 years  All questions were answered.      [name] Blazing, MD Gastroenterology and Hepatology [facility]

## 2023-11-25 ENCOUNTER — Other Ambulatory Visit (INDEPENDENT_AMBULATORY_CARE_PROVIDER_SITE_OTHER): Payer: Self-pay | Admitting: Gastroenterology

## 2023-11-25 MED ORDER — PREDNISONE 50 MG PO TABS: ORAL_TABLET | ORAL | 0 refills | Status: DC

## 2023-11-25 MED ORDER — DIPHENHYDRAMINE HCL 50 MG PO CAPS: ORAL_CAPSULE | ORAL | 0 refills | Status: DC

## 2023-11-25 NOTE — Telephone Encounter (Signed)
 Prednisone 50 mg- one tablet 13 hours before exam, one tablet 7 hours before exam and one tablet one hour prior to exam along with 50 mg Benadryl

## 2023-11-25 NOTE — Telephone Encounter (Signed)
 Thanks, premeds sent

## 2023-11-27 ENCOUNTER — Ambulatory Visit (HOSPITAL_COMMUNITY)

## 2023-11-29 ENCOUNTER — Ambulatory Visit (INDEPENDENT_AMBULATORY_CARE_PROVIDER_SITE_OTHER): Payer: Medicare Other

## 2023-11-29 VITALS — Ht 69.0 in | Wt 172.0 lb

## 2023-11-29 DIAGNOSIS — E119 Type 2 diabetes mellitus without complications: Secondary | ICD-10-CM

## 2023-11-29 DIAGNOSIS — Z0000 Encounter for general adult medical examination without abnormal findings: Secondary | ICD-10-CM | POA: Diagnosis not present

## 2023-11-29 DIAGNOSIS — E1140 Type 2 diabetes mellitus with diabetic neuropathy, unspecified: Secondary | ICD-10-CM

## 2023-11-29 NOTE — Patient Instructions (Signed)
 [name] , Thank you for taking time to come for your Medicare Wellness Visit. I appreciate your ongoing commitment to your health goals. Please review the following plan we discussed and let me know if I can assist you in the future.   Please see your treatment plan below: Referrals:n/a this visit Screenings:n/a this visit Labs:Diabetic Urine Screening Have this done at the lab at [facility] Follow-Up:Dr. Allena Katz on December 05, 2023 at 2:00pm Medicare Annual Wellness Visit: December 03, 2024 at 11:20 am video visit. Clinician Recommendations: Aim for 30 minutes of exercise or brisk walking, 6-8 glasses of water, and 5 servings of fruits and vegetables each day.  You are due for the vaccines checked below. You may have these done at your preferred pharmacy. Please have them fax the office proof of the vaccines so that we can update your chart.   []  Flu (due annually)  Recommended this fall either at PCP office or through your local pharmacy. The flu season starts August 1 of each year.   [x]  Shingrix (Shingles vaccine): CDC recommends 2 doses of Shingrix separated by 2-6 months for aged 41 years and older:  [x]  Pneumonia Vaccines: Recommended for adults 65 years or older  [x]  TDAP (Tetanus) Vaccine every 10 years:Recommended every 10 years; Please call your insurance company to determine your out of pocket expense. You also receive this vaccine at your local pharmacy or Health Dept.  [x]  Covid-19: Available now at any [facility] pharmacy (see info below)  You may also get your vaccines at any [facility] (locations listed below.) Vaccine hours are Monday - Friday 9:00 - 4:00. No appointments are required. Most insurances are accepted including Medicaid. Anyone can use the community pharmacies, and people are not required to have a [facility] provider.  Community Pharmacy Locations offering vaccines:   Sport and exercise psychologist   [facility]  10 vaccines are offered at the [employer]: Covid, flu, Tdap, shingles, RSV, pneumonia, meningococcal, hepatitis A, hepatitis B, and HPV.    This is a list of the screening recommended for you and due dates:  Health Maintenance  Topic Date Due   COVID-19 Vaccine (1) Never done   Pneumococcal Vaccination (1 of 2 - PCV) Never done   DTaP/Tdap/Td vaccine (1 - Tdap) Never done   Zoster (Shingles) Vaccine (1 of 2) Never done   Yearly kidney health urinalysis for diabetes  12/30/2023   Complete foot exam   12/30/2023   Hemoglobin A1C  01/01/2024   Eye exam for diabetics  01/27/2024   Flu Shot  03/23/2024   Yearly kidney function blood test for diabetes  08/11/2024   Medicare Annual Wellness Visit  11/28/2024   Colon Cancer Screening  11/12/2026   Hepatitis C Screening  Completed   HIV Screening  Completed   HPV Vaccine  Aged Out    Advanced directives: (Declined) Advance directive discussed with you today. Even though you declined this today, please call our office should you change your mind, and we can give you the proper paperwork for you to fill out.  Information on Advanced Care Planning can be found at [facility] of [facility] Advance Health Care Directives Advance Health Care Directives ([email or web address]   Advance Care Planning is important because it:  [x]  Makes sure you receive the medical care that is consistent with your values, goals, and preferences  [  x] It provides guidance to your family and loved ones and it also reduces their decisional burden about whether or not they are making the right decisions based on what you want done  Follow the link provided in your after visit summary or read over the paperwork we have mailed to you to help you started getting your Advance Directives in place. If you need assistance in completing these, please reach out to [place] so that we can help you!   Next  Medicare Annual Wellness Visit scheduled for next year: yes  Understanding Your Risk for Falls Millions of people have serious injuries from falls each year. It is important to understand your risk of falling. Talk with your health care provider about your risk and what you can do to lower it. If you do have a serious fall, make sure to tell your provider. Falling once raises your risk of falling again. How can falls affect me? Serious injuries from falls are common. These include: Broken bones, such as hip fractures. Head injuries, such as traumatic brain injuries (TBI) or concussions. A fear of falling can cause you to avoid activities and stay at home. This can make your muscles weaker and raise your risk for a fall. What can increase my risk? There are a number of risk factors that increase your risk for falling. The more risk factors you have, the higher your risk of falling. Serious injuries from a fall happen most often to people who are older than 61 years old. Teenagers and young adults ages 70-29 are also at higher risk. Common risk factors include: Weakness in the lower body. Being generally weak or confused due to long-term (chronic) illness. Dizziness or balance problems. Poor vision. Medicines that cause dizziness or drowsiness. These may include: Medicines for your blood pressure, heart, anxiety, insomnia, or swelling (edema). Pain medicines. Muscle relaxants. Other risk factors include: Drinking alcohol. Having had a fall in the past. Having foot pain or wearing improper footwear. Working at a dangerous job. Having any of the following in your home: Tripping hazards, such as floor clutter or loose rugs. Poor lighting. Pets. Having dementia or memory loss. What actions can I take to lower my risk of falling?     Physical activity Stay physically fit. Do strength and balance exercises. Consider taking a regular class to build strength and balance. Yoga and tai chi  are good options. Vision Have your eyes checked every year and your prescription for glasses or contacts updated as needed. Shoes and walking aids Wear non-skid shoes. Wear shoes that have rubber soles and low heels. Do not wear high heels. Do not walk around the house in socks or slippers. Use a cane or walker as told by your provider. Home safety Attach secure railings on both sides of your stairs. Install grab bars for your bathtub, shower, and toilet. Use a non-skid mat in your bathtub or shower. Attach bath mats securely with double-sided, non-slip rug tape. Use good lighting in all rooms. Keep a flashlight near your bed. Make sure there is a clear path from your bed to the bathroom. Use night-lights. Do not use throw rugs. Make sure all carpeting is taped or tacked down securely. Remove all clutter from walkways and stairways, including extension cords. Repair uneven or broken steps and floors. Avoid walking on icy or slippery surfaces. Walk on the grass instead of on icy or slick sidewalks. Use ice melter to get rid of ice on walkways in the winter. Use  a cordless phone. Questions to ask your health care provider Can you help me check my risk for a fall? Do any of my medicines make me more likely to fall? Should I take a vitamin D supplement? What exercises can I do to improve my strength and balance? Should I make an appointment to have my vision checked? Do I need a bone density test to check for weak bones (osteoporosis)? Would it help to use a cane or a walker? Where to find more information Centers for Disease Control and Prevention, STEADI: TonerPromos.no Community-Based Fall Prevention Programs: TonerPromos.no [employer] on Aging: BaseRingTones.pl Contact a health care provider if: You fall at home. You are afraid of falling at home. You feel weak, drowsy, or dizzy. This information is not intended to replace advice given to you by your health care provider. Make sure you  discuss any questions you have with your health care provider. Document Revised: 04/12/2022 Document Reviewed: 04/12/2022 Elsevier Patient Education  2024 Elsevier Inc.  Managing Pain Without Opioids  Opioids are strong medicines used to treat moderate to severe pain. For some people, especially those who have long-term (chronic) pain, opioids may not be the best choice for pain management due to: Side effects like nausea, constipation, and sleepiness. The risk of addiction (opioid use disorder). The longer you take opioids, the greater your risk of addiction. Pain that lasts for more than 3 months is called chronic pain. Managing chronic pain usually requires more than one approach and is often provided by a team of health care providers working together (multidisciplinary approach). Pain management may be done at a pain management center or pain clinic. How to manage pain without the use of opioids Use non-opioid medicines Non-opioid medicines for pain may include: Over-the-counter or prescription non-steroidal anti-inflammatory drugs (NSAIDs). These may be the first medicines used for pain. They work well for muscle and bone pain, and they reduce swelling. Acetaminophen. This over-the-counter medicine may work well for milder pain but not swelling. Antidepressants. These may be used to treat chronic pain. A certain type of antidepressant (tricyclics) is often used. These medicines are given in lower doses for pain than when used for depression. Anticonvulsants. These are usually used to treat seizures but may also reduce nerve (neuropathic) pain. Muscle relaxants. These relieve pain caused by sudden muscle tightening (spasms). You may also use a pain medicine that is applied to the skin as a patch, cream, or gel (topical analgesic), such as a numbing medicine. These may cause fewer side effects than medicines taken by mouth. Do certain therapies as directed Some therapies can help with pain  management. They include: Physical therapy. You will do exercises to gain strength and flexibility. A physical therapist may teach you exercises to move and stretch parts of your body that are weak, stiff, or painful. You can learn these exercises at physical therapy visits and practice them at home. Physical therapy may also involve: Massage. Heat wraps or applying heat or cold to affected areas. Electrical signals that interrupt pain signals (transcutaneous electrical nerve stimulation, TENS). Weak lasers that reduce pain and swelling (low-level laser therapy). Signals from your body that help you learn to regulate pain (biofeedback). Occupational therapy. This helps you to learn ways to function at home and work with less pain. Recreational therapy. This involves trying new activities or hobbies, such as a physical activity or drawing. Mental health therapy, including: Cognitive behavioral therapy (CBT). This helps you learn coping skills for dealing with pain. Acceptance  and commitment therapy (ACT) to change the way you think and react to pain. Relaxation therapies, including muscle relaxation exercises and mindfulness-based stress reduction. Pain management counseling. This may be individual, family, or group counseling.  Receive medical treatments Medical treatments for pain management include: Nerve block injections. These may include a pain blocker and anti-inflammatory medicines. You may have injections: Near the spine to relieve chronic back or neck pain. Into joints to relieve back or joint pain. Into nerve areas that supply a painful area to relieve body pain. Into muscles (trigger point injections) to relieve some painful muscle conditions. A medical device placed near your spine to help block pain signals and relieve nerve pain or chronic back pain (spinal cord stimulation device). Acupuncture. Follow these instructions at home Medicines Take over-the-counter and prescription  medicines only as told by your health care provider. If you are taking pain medicine, ask your health care providers about possible side effects to watch out for. Do not drive or use heavy machinery while taking prescription opioid pain medicine. Lifestyle  Do not use drugs or alcohol to reduce pain. If you drink alcohol, limit how much you have to: 0-1 drink a day for women who are not pregnant. 0-2 drinks a day for men. Know how much alcohol is in a drink. In the [place] one drink equals one 12 oz bottle of beer (355 mL), one 5 oz glass of wine (148 mL), or one 1 oz glass of hard liquor (44 mL). Do not use any products that contain nicotine or tobacco. These products include cigarettes, chewing tobacco, and vaping devices, such as e-cigarettes. If you need help quitting, ask your health care provider. Eat a healthy diet and maintain a healthy weight. Poor diet and excess weight may make pain worse. Eat foods that are high in fiber. These include fresh fruits and vegetables, whole grains, and beans. Limit foods that are high in fat and processed sugars, such as fried and sweet foods. Exercise regularly. Exercise lowers stress and may help relieve pain. Ask your health care provider what activities and exercises are safe for you. If your health care provider approves, join an exercise class that combines movement and stress reduction. Examples include yoga and tai chi. Get enough sleep. Lack of sleep may make pain worse. Lower stress as much as possible. Practice stress reduction techniques as told by your therapist. General instructions Work with all your pain management providers to find the treatments that work best for you. You are an important member of your pain management team. There are many things you can do to reduce pain on your own. Consider joining an online or in-person support group for people who have chronic pain. Keep all follow-up visits. This is important. Where to find more  information You can find more information about managing pain without opioids from: American Academy of Pain Medicine: painmed.org Institute for Chronic Pain: instituteforchronicpain.org American Chronic Pain Association: theacpa.org Contact a health care provider if: You have side effects from pain medicine. Your pain gets worse or does not get better with treatments or home therapy. You are struggling with anxiety or depression. Summary Many types of pain can be managed without opioids. Chronic pain may respond better to pain management without opioids. Pain is best managed when you and a team of health care providers work together. Pain management without opioids may include non-opioid medicines, medical treatments, physical therapy, mental health therapy, and lifestyle changes. Tell your health care providers if your pain gets  worse or is not being managed well enough. This information is not intended to replace advice given to you by your health care provider. Make sure you discuss any questions you have with your health care provider. Document Revised: 11/19/2020 Document Reviewed: 11/19/2020 Elsevier Patient Education  2023 [employer].

## 2023-11-29 NOTE — Progress Notes (Signed)
 Because this visit was a virtual/telehealth visit,  certain criteria was not obtained, such a blood pressure, CBG if applicable, and timed get up and go. Any medications not marked as "taking" were not mentioned during the medication reconciliation part of the visit. Any vitals not documented were not able to be obtained due to this being a telehealth visit or patient was unable to self-report a recent blood pressure reading due to a lack of equipment at home via telehealth. Vitals that have been documented are verbally provided by the patient.   Subjective:   [name] is a 63 y.o. who presents for a Medicare Wellness preventive visit.  Visit Complete: Virtual I connected with  [name] on 11/29/23 by a audio enabled telemedicine application and verified that I am speaking with the correct person using two identifiers.  Patient Location: Home  Provider Location: Home Office  I discussed the limitations of evaluation and management by telemedicine. The patient expressed understanding and agreed to proceed.  Vital Signs: Because this visit was a virtual/telehealth visit, some criteria may be missing or patient reported. Any vitals not documented were not able to be obtained and vitals that have been documented are patient reported.  VideoDeclined- This patient declined Librarian, academic. Therefore the visit was completed with audio only.  Persons Participating in Visit: Patient.  AWV Questionnaire: No: Patient Medicare AWV questionnaire was not completed prior to this visit.  Cardiac Risk Factors include: advanced age (>51men, >72 women);diabetes mellitus;dyslipidemia;male gender;Other (see comment), Risk factor comments: CAD     Objective:    Today's Vitals   11/29/23 1240 11/29/23 1241  Weight: 172 lb (78 kg)   Height: 5\' 9"  (1.753 m)   PainSc:  6    Body mass index is 25.4 kg/m.     11/29/2023   12:47 PM 10/25/2023    9:57 AM 10/21/2023     8:46 AM 11/16/2022    2:13 PM 11/11/2021   12:46 PM 11/06/2021    2:23 PM 11/02/2021    1:46 PM  Advanced Directives  Does Patient Have a Medical Advance Directive? No No No No No No No  Would patient like information on creating a medical advance directive? No - Patient declined No - Patient declined No - Patient declined Yes (ED - Information included in AVS) No - Patient declined No - Patient declined Yes (ED - Information included in AVS)    Current Medications (verified) Outpatient Encounter Medications as of 11/29/2023  Medication Sig   Cholecalciferol 125 MCG (5000 UT) TABS Take 10,000 Units by mouth daily.   clopidogrel (PLAVIX) 75 MG tablet TAKE 1 TABLET BY MOUTH ONCE DAILY as a blood thinner   Continuous Blood Gluc Receiver (DEXCOM G7 RECEIVER) DEVI USE TO CHECK GLUCOSE AS NEEDED   Continuous Blood Gluc Sensor (DEXCOM G7 SENSOR) MISC CHANGE SENSOR EVERY 10 DAYS   diazepam (VALIUM) 10 MG tablet Take 0.5 tablets (5 mg total) by mouth at bedtime as needed for anxiety or sleep.   diphenhydrAMINE (BENADRYL) 50 MG capsule Take one tablet one hour prior to CT   DULoxetine (CYMBALTA) 60 MG capsule TAKE ONE CAPSULE BY MOUTH DAILY   ezetimibe (ZETIA) 10 MG tablet Take 1 tablet (10 mg total) by mouth daily.   gabapentin (NEURONTIN) 300 MG capsule TAKE (2) CAPSULES BY MOUTH (3) TIMES DAILY. (Patient taking differently: Take 300 mg by mouth 2 (two) times daily.)   hydrochlorothiazide (HYDRODIURIL) 25 MG tablet TAKE 1 TABLET BY  MOUTH IN THE MORNING   HYDROcodone-acetaminophen (NORCO) 10-325 MG tablet Take 1 tablet by mouth every 6 (six) hours as needed for severe pain (pain score 7-10). (Patient taking differently: Take 1 tablet by mouth every 6 (six) hours as needed for severe pain (pain score 7-10). 1/2 pill bid.)   icosapent Ethyl (VASCEPA) 1 g capsule Take 2 capsules (2 g total) by mouth 2 (two) times daily.   insulin NPH-regular Human (NOVOLIN 70/30) (70-30) 100 UNIT/ML injection Inject 60  Units into the skin 2 (two) times daily before a meal. When glucose is above 90 and eating   lansoprazole (PREVACID) 30 MG capsule Take 1 capsule (30 mg total) by mouth 2 (two) times daily before a meal.   losartan (COZAAR) 50 MG tablet Take 1 tablet (50 mg total) by mouth daily.   metFORMIN (GLUCOPHAGE-XR) 500 MG 24 hr tablet Take 1 tablet (500 mg total) by mouth 2 (two) times daily with a meal.   methocarbamol (ROBAXIN) 500 MG tablet Take 1 tablet (500 mg total) by mouth every 8 (eight) hours as needed for muscle spasms.   metoprolol tartrate (LOPRESSOR) 25 MG tablet TAKE 1 TABLET BY MOUTH TWICE DAILY   nitroGLYCERIN (NITROSTAT) 0.4 MG SL tablet Place 0.4 mg under the tongue every 5 (five) minutes as needed for chest pain. Reported on 02/26/2016   NONFORMULARY OR COMPOUNDED ITEM Apply 1 application. topically daily as needed (neuropathy). Compounded cream for foot pain. Gets at [facility].   ondansetron (ZOFRAN) 4 MG tablet Take 1 tablet (4 mg total) by mouth every 8 (eight) hours as needed for nausea or vomiting.   polyethylene glycol (MIRALAX / GLYCOLAX) 17 g packet Take 17 g by mouth daily as needed for moderate constipation.   predniSONE (DELTASONE) 50 MG tablet Take prednisone  tablet 13 hours before CT scan, then one tablet 7 hours before CT, then one tablet one hour prior to CT   tadalafil (CIALIS) 20 MG tablet Take 1 tablet (20 mg total) by mouth daily as needed.   tadalafil (CIALIS) 5 MG tablet Take 1 tablet (5 mg total) by mouth daily as needed for erectile dysfunction.   No facility-administered encounter medications on file as of 11/29/2023.    Allergies (verified) Iodinated contrast media, Iohexol, Metoclopramide hcl, Alfuzosin hcl er, Sulfonamide derivatives, Enalapril, Penicillins, and Sulfa antibiotics   History: Past Medical History:  Diagnosis Date   Anemia    Bypass graft stenosis (HCC)    '06   Cancer (HCC)    Polycythemia vera   Cervical spinal stenosis 05/06/2020    C5-6 level   Chronic gastritis    Coronary artery disease    Patent Stent to Circ.  Patent coronary bypass grafts with a free radial graft to PDA and LIMA to the  diag.   Diabetes mellitus    Diabetic peripheral neuropathy (HCC) 05/06/2020   Dyslipidemia    Poorly controlled, probably related to his DM (Some of this is secondary to his inability to afford medications).   ED (erectile dysfunction)    Esophageal yeast infection (HCC)    Gastroparesis    Hypertension    Metaplasia of esophagus    Pancreatitis chronic    Pseudocyst   Polycythemia vera(238.4) 04/26/2012   PONV (postoperative nausea and vomiting)    Past Surgical History:  Procedure Laterality Date   APPENDECTOMY     1980   BIOPSY  11/11/2021   Procedure: BIOPSY;  Surgeon: Malissa Hippo, MD;  [facility];  Service: Endoscopy;;   CARDIAC CATHETERIZATION     CHOLECYSTECTOMY     COLONOSCOPY  07/31/1985   COLONOSCOPY N/A 03/11/2016   Procedure: COLONOSCOPY;  Surgeon: Malissa Hippo, MD;  [facility]: [facility];  Service: Endoscopy;  Laterality: N/A;   COLONOSCOPY WITH PROPOFOL N/A 11/11/2021   Procedure: COLONOSCOPY WITH PROPOFOL;  Surgeon: Malissa Hippo, MD;  [facility]: [facility];  Service: Endoscopy;  Laterality: N/A;  205   CORONARY ANGIOPLASTY WITH STENT PLACEMENT  08/23/2004   CORONARY ARTERY BYPASS GRAFT  11/24/2004   ESOPHAGOGASTRODUODENOSCOPY  09/19/2001   ESOPHAGOGASTRODUODENOSCOPY N/A 03/11/2016   Procedure: ESOPHAGOGASTRODUODENOSCOPY (EGD);  Surgeon: Malissa Hippo, MD;  [facility];  Service: Endoscopy;  Laterality: N/A;  12:00   ESOPHAGOGASTRODUODENOSCOPY (EGD) WITH PROPOFOL N/A 11/11/2021   Procedure: ESOPHAGOGASTRODUODENOSCOPY (EGD) WITH PROPOFOL;  Surgeon: Malissa Hippo, MD;  [facility];  Service: Endoscopy;  Laterality: N/A;   ESOPHAGOGASTRODUODENOSCOPY (EGD) WITH PROPOFOL N/A 10/25/2023   Procedure: ESOPHAGOGASTRODUODENOSCOPY (EGD) WITH PROPOFOL;   Surgeon: Dolores Frame, MD;  [facility];  Service: Gastroenterology;  Laterality: N/A;  10:45am;asa 3   FINGER TENDON REPAIR Bilateral    in middle finger   KNEE SURGERY     left knee   NASAL SEPTUM SURGERY     POLYPECTOMY  11/11/2021   Procedure: POLYPECTOMY;  Surgeon: Malissa Hippo, MD;  [facility];  Service: Endoscopy;;   Family History  Problem Relation Age of Onset   Coronary artery disease Mother        strong family history of   Heart attack Mother        dying of (at age 41)   Diabetes Mother    Diabetes Sister    Diabetes Brother    Pancreatitis Brother    Healthy Daughter    Hyperlipidemia Daughter    Heart disease Brother    Hypertension Brother    Hyperlipidemia Brother    Social History   Socioeconomic History   Marital status: Married    Spouse name: Not on file   Number of children: Not on file   Years of education: Not on file   Highest education level: Not on file  Occupational History   Not on file  Tobacco Use   Smoking status: Never   Smokeless tobacco: Never  Vaping Use   Vaping status: Never Used  Substance and Sexual Activity   Alcohol use: Not Currently   Drug use: No   Sexual activity: Not on file  Other Topics Concern   Not on file  Social History Narrative   The patient lives in [place] with his wife.  He is     disabled secondary to coronary artery disease and pancreatitis.  He does     not smoke, he does not drink alcohol.  There is no drug use.    Social Drivers of Corporate investment banker Strain: Low Risk  (11/29/2023)   Overall Financial Resource Strain (CARDIA)    Difficulty of Paying Living Expenses: Not hard at all  Food Insecurity: No Food Insecurity (11/29/2023)   Hunger Vital Sign    Worried About Running Out of Food in the Last Year: Never true    Ran Out of Food in the Last Year: Never true  Transportation Needs: No Transportation Needs (11/29/2023)   PRAPARE - Therapist, art (Medical): No    Lack of Transportation (Non-Medical): No  Physical Activity:  Inactive (11/29/2023)   Exercise Vital Sign    Days of Exercise per Week: 0 days    Minutes of Exercise per Session: 0 min  Stress: No Stress Concern Present (11/29/2023)   [employer] of Occupational Health - Occupational Stress Questionnaire    Feeling of Stress : Only a little  Social Connections: Moderately Integrated (11/29/2023)   Social Connection and Isolation Panel [NHANES]    Frequency of Communication with Friends and Family: More than three times a week    Frequency of Social Gatherings with Friends and Family: More than three times a week    Attends Religious Services: More than 4 times per year    Active Member of [employer] or Organizations: No    Attends Engineer, structural: Never    Marital Status: Married    Tobacco Counseling Counseling given: Yes    Clinical Intake:  Pre-visit preparation completed: Yes  Pain : 0-10 Pain Score: 6  Pain Type: Chronic pain Pain Location: Hand Pain Orientation: Right, Left Pain Descriptors / Indicators: Aching, Burning, Constant, Tingling Pain Onset: More than a month ago Pain Frequency: Constant Effect of Pain on Daily Activities: yes. due to neuropathy  Pain Location: Foot (bilateral)  BMI - recorded: 25.4 Nutritional Risks: None Diabetes: Yes CBG done?: No (telehealth visit.)  Lab Results  Component Value Date   HGBA1C 8.8 (H) 07/04/2023   HGBA1C 7.7 (H) 12/30/2022   HGBA1C 7.4 (A) 07/22/2022     How often do you need to have someone help you when you read instructions, pamphlets, or other written materials from your doctor or pharmacy?: 1 - Never  Interpreter Needed?: No  Information entered by :: Maryjean Ka CMA   Activities of Daily Living     11/29/2023   12:44 PM 10/21/2023    9:01 AM  In your present state of health, do you have any difficulty performing the following activities:  Hearing? 1    Comment wears hearing aids and tinnitus, only wears them when he watches tv   Vision? 0   Difficulty concentrating or making decisions? 0   Walking or climbing stairs? 0   Dressing or bathing? 0   Doing errands, shopping? 0 0  Preparing Food and eating ? N   Using the Toilet? N   In the past six months, have you accidently leaked urine? N   Do you have problems with loss of bowel control? N   Managing your Medications? N   Managing your Finances? N   Housekeeping or managing your Housekeeping? N     Patient Care Team: [name], MD as PCP - General (Internal Medicine) Rollene Rotunda, MD as PCP - Cardiology (Cardiology) Case, Helen Hashimoto, MD as Attending Physician (Orthopedic Surgery) Daisy Lazar, DO (Optometry)  Indicate any recent Medical Services you may have received from other than [facility] providers in the past year (date may be approximate).     Assessment:   This is a routine wellness examination for Faris.  Hearing/Vision screen Hearing Screening - Comments:: Patient wears hearing aids. Also has chronic tinnitus bilaterally Vision Screening - Comments:: Wears rx glasses - up to date with routine eye exams  Patient sees Dr. Daisy Lazar w/ [facility] [place] office.     Goals Addressed             This Visit's Progress    Patient Stated       Try to get back to the gym, increase physical activity and  work on increasing strength       Depression Screen     11/29/2023   12:49 PM 07/04/2023    1:57 PM 12/30/2022    3:30 PM 11/16/2022    2:15 PM 04/14/2022    3:51 PM 12/22/2021    1:43 PM 11/03/2021   11:28 AM  PHQ 2/9 Scores  PHQ - 2 Score 0 0 0 0 0 0 0  PHQ- 9 Score 6     0     Fall Risk     11/29/2023   12:48 PM 07/04/2023    1:57 PM 12/30/2022    3:30 PM 11/16/2022    2:15 PM 08/12/2022    3:40 PM  Fall Risk   Falls in the past year? 0 0 0 0 0  Number falls in past yr: 0 0 0  0  Injury with Fall? 0 0 0  0  Risk for fall due to : No Fall  Risks No Fall Risks     Follow up Falls prevention discussed;Falls evaluation completed Falls evaluation completed       MEDICARE RISK AT HOME:  Medicare Risk at Home Any stairs in or around the home?: Yes If so, are there any without handrails?: Yes Home free of loose throw rugs in walkways, pet beds, electrical cords, etc?: Yes Adequate lighting in your home to reduce risk of falls?: Yes Life alert?: No Use of a cane, walker or w/c?: No Grab bars in the bathroom?: Yes Shower chair or bench in shower?: No Elevated toilet seat or a handicapped toilet?: No  TIMED UP AND GO:  Was the test performed?  No  Cognitive Function: 6CIT completed        11/29/2023   12:49 PM 11/16/2022    2:16 PM 11/02/2021    1:54 PM 10/09/2020    1:58 PM  6CIT Screen  What Year? 0 points 0 points 0 points 0 points  What month? 0 points 0 points 0 points 0 points  What time? 0 points 0 points 0 points 0 points  Count back from 20 0 points 0 points 0 points 0 points  Months in reverse 0 points 0 points 0 points 0 points  Repeat phrase 0 points 0 points 0 points 0 points  Total Score 0 points 0 points 0 points 0 points    Immunizations Immunization History  Administered Date(s) Administered   Influenza Split 07/23/2014    Screening Tests Health Maintenance  Topic Date Due   COVID-19 Vaccine (1) Never done   Pneumococcal Vaccine 64-34 Years old (1 of 2 - PCV) Never done   DTaP/Tdap/Td (1 - Tdap) Never done   Zoster Vaccines- Shingrix (1 of 2) Never done   Diabetic kidney evaluation - Urine ACR  12/30/2023   FOOT EXAM  12/30/2023   HEMOGLOBIN A1C  01/01/2024   OPHTHALMOLOGY EXAM  01/27/2024   INFLUENZA VACCINE  03/23/2024   Diabetic kidney evaluation - eGFR measurement  08/11/2024   Medicare Annual Wellness (AWV)  11/28/2024   Colonoscopy  11/12/2026   Hepatitis C Screening  Completed   HIV Screening  Completed   HPV VACCINES  Aged Out    Health Maintenance  Health Maintenance Due   Topic Date Due   COVID-19 Vaccine (1) Never done   Pneumococcal Vaccine 53-76 Years old (1 of 2 - PCV) Never done   DTaP/Tdap/Td (1 - Tdap) Never done   Zoster Vaccines- Shingrix (1 of 2) Never done   Health  Maintenance Items Addressed: UACR (Urine Albumin:Creatinine Ratio) Follow up appointment scheduled with pcp Additional Screening:  Vision Screening: Recommended annual ophthalmology exams for early detection of glaucoma and other disorders of the eye.  Dental Screening: Recommended annual dental exams for proper oral hygiene  Community Resource Referral / Chronic Care Management: CRR required this visit?  No   CCM required this visit?  No     Plan:     I have personally reviewed and noted the following in the patient's chart:   Medical and social history Use of alcohol, tobacco or illicit drugs  Current medications and supplements including opioid prescriptions. Patient is currently taking opioid prescriptions. Information provided to patient regarding non-opioid alternatives. Patient advised to discuss non-opioid treatment plan with their provider. Functional ability and status Nutritional status Physical activity Advanced directives List of other physicians Hospitalizations, surgeries, and ER visits in previous 12 months Vitals Screenings to include cognitive, depression, and falls Referrals and appointments  In addition, I have reviewed and discussed with patient certain preventive protocols, quality metrics, and best practice recommendations. A written personalized care plan for preventive services as well as general preventive health recommendations were provided to patient.     Jordan Hawks Emelynn Rance, CMA   11/29/2023   After Visit Summary: (MyChart) Due to this being a telephonic visit, the after visit summary with patients personalized plan was offered to patient via MyChart   Notes: Please refer to Routing Comments.

## 2023-11-30 ENCOUNTER — Other Ambulatory Visit: Payer: Self-pay | Admitting: Internal Medicine

## 2023-11-30 ENCOUNTER — Encounter: Payer: Self-pay | Admitting: Internal Medicine

## 2023-11-30 DIAGNOSIS — E1140 Type 2 diabetes mellitus with diabetic neuropathy, unspecified: Secondary | ICD-10-CM

## 2023-11-30 MED ORDER — FREESTYLE LIBRE 3 PLUS SENSOR MISC
3 refills | Status: AC
Start: 2023-11-30 — End: ?

## 2023-11-30 MED ORDER — FREESTYLE LIBRE 3 READER DEVI: 0 refills | Status: AC

## 2023-12-03 ENCOUNTER — Ambulatory Visit (HOSPITAL_COMMUNITY)
Admission: RE | Admit: 2023-12-03 | Discharge: 2023-12-03 | Disposition: A | Discharge status: Home / Self Care | Source: Ambulatory Visit | Attending: Gastroenterology | Admitting: Gastroenterology

## 2023-12-03 ENCOUNTER — Other Ambulatory Visit (INDEPENDENT_AMBULATORY_CARE_PROVIDER_SITE_OTHER): Payer: Self-pay | Admitting: Gastroenterology

## 2023-12-03 DIAGNOSIS — K869 Disease of pancreas, unspecified: Secondary | ICD-10-CM | POA: Insufficient documentation

## 2023-12-03 DIAGNOSIS — K760 Fatty (change of) liver, not elsewhere classified: Secondary | ICD-10-CM | POA: Diagnosis not present

## 2023-12-03 DIAGNOSIS — Z9049 Acquired absence of other specified parts of digestive tract: Secondary | ICD-10-CM | POA: Diagnosis not present

## 2023-12-03 DIAGNOSIS — I700 Atherosclerosis of aorta: Secondary | ICD-10-CM | POA: Diagnosis not present

## 2023-12-03 MED ORDER — GADOBUTROL 1 MMOL/ML IV SOLN
7.5000 mL | Freq: Once | INTRAVENOUS | Status: AC | PRN
  Administered 2023-12-03: 7.5 mL via INTRAVENOUS

## 2023-12-05 ENCOUNTER — Encounter (INDEPENDENT_AMBULATORY_CARE_PROVIDER_SITE_OTHER): Payer: Self-pay

## 2023-12-05 ENCOUNTER — Ambulatory Visit: Admitting: Internal Medicine

## 2023-12-05 ENCOUNTER — Other Ambulatory Visit: Payer: Self-pay | Admitting: Internal Medicine

## 2023-12-05 DIAGNOSIS — E1140 Type 2 diabetes mellitus with diabetic neuropathy, unspecified: Secondary | ICD-10-CM

## 2023-12-06 ENCOUNTER — Encounter: Payer: Self-pay | Admitting: Internal Medicine

## 2023-12-06 ENCOUNTER — Ambulatory Visit (INDEPENDENT_AMBULATORY_CARE_PROVIDER_SITE_OTHER): Admitting: Internal Medicine

## 2023-12-06 VITALS — BP 128/77 | HR 85 | Ht 69.5 in | Wt 170.6 lb

## 2023-12-06 DIAGNOSIS — I25118 Atherosclerotic heart disease of native coronary artery with other forms of angina pectoris: Secondary | ICD-10-CM

## 2023-12-06 DIAGNOSIS — E1140 Type 2 diabetes mellitus with diabetic neuropathy, unspecified: Secondary | ICD-10-CM | POA: Diagnosis not present

## 2023-12-06 DIAGNOSIS — N1831 Chronic kidney disease, stage 3a: Secondary | ICD-10-CM

## 2023-12-06 DIAGNOSIS — F339 Major depressive disorder, recurrent, unspecified: Secondary | ICD-10-CM

## 2023-12-06 DIAGNOSIS — Z794 Long term (current) use of insulin: Secondary | ICD-10-CM

## 2023-12-06 DIAGNOSIS — I10 Essential (primary) hypertension: Secondary | ICD-10-CM

## 2023-12-06 DIAGNOSIS — K861 Other chronic pancreatitis: Secondary | ICD-10-CM | POA: Diagnosis not present

## 2023-12-06 DIAGNOSIS — I7381 Erythromelalgia: Secondary | ICD-10-CM | POA: Diagnosis not present

## 2023-12-06 DIAGNOSIS — D45 Polycythemia vera: Secondary | ICD-10-CM | POA: Diagnosis not present

## 2023-12-06 DIAGNOSIS — E1142 Type 2 diabetes mellitus with diabetic polyneuropathy: Secondary | ICD-10-CM

## 2023-12-06 MED ORDER — NOVOLIN 70/30 FLEXPEN (70-30) 100 UNIT/ML ~~LOC~~ SUPN: 60.0000 [IU] | PEN_INJECTOR | Freq: Two times a day (BID) | SUBCUTANEOUS | 5 refills | Status: DC

## 2023-12-06 NOTE — Assessment & Plan Note (Addendum)
 Lab Results  Component Value Date   HGBA1C 8.8 (H) 07/04/2023   Uncontrolled On Novolin  70/30 - 60 U BID and Metformin  500 mg BID, used to f/u with Dr [name] Needs to take Novolin  regularly - switched to pen form, advised to take 48 Units at evening if blood glucose between 100-150 instead of taking random dose If persistent, will need to switch to Tresiba or Toujeo with ISS Advised to follow diabetic diet -has joined diabetes reversal group Not on any statin due to statin intolerance Diabetic eye exam: Advised to follow up with Ophthalmology for diabetic eye exam

## 2023-12-06 NOTE — Patient Instructions (Signed)
 Please take Novolin 60 U twice daily as discussed. If blood glucose is less than 150, you can reduce dose to 48 U for that dose.  Please continue to take medications as prescribed.  Please continue to follow low carb diet and perform moderate exercise/walking at least 150 mins/week.

## 2023-12-06 NOTE — Assessment & Plan Note (Signed)
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

## 2023-12-06 NOTE — Progress Notes (Signed)
 Established Patient Office Visit  Subjective:  Patient ID: [name], male    DOB: [birth year]  Age: 63 y.o. MRN: [id number]  CC:  Chief Complaint  Patient presents with   Follow-up    F/u     HPI [name] is a 63 y.o. male with past medical history of CAD status post CABG, hypertension, uncontrolled diabetes mellitus-on insulin, severe diabetic neuropathy, chronic pancreatitis, polycythemia vera, hyperlipidemia, cervical spinal stenosis and chronic back pain who presents for f/u of his chronic medical conditions.  HTN: BP is wnl today. Takes medications regularly. Patient denies headache, dizziness, chest pain, dyspnea or palpitations currently.   DM: HbA1C was 8.8 in 11/24. He used to see Dr Monte Antonio and his insulin dose was increased to 60 U BID in the last visit.  He reports that he takes lesser dose in the evening at times if blood glucose is close to 150.  He also takes Metformin  500 mg twice daily. Denies any polyuria or polyphagia currently.  He has CGM device, and his blood glucose runs around 150-200 most of the time, except post-meal readings.   Diabetic neuropathy: Takes gabapentin  and as needed Norco for severe pain.  He has tried gabapentin  cream as well for neuropathy. He continues to have severe burning pain in his feet, but Cymbalta  has helped somewhat now.  MDD: He takes Cymbalta  for it.  Currently denies any apathy, SI or HI currently.  He was switched to Cymbalta  from Paxil  for better neuropathic pain coverage.  He reports chronic insomnia, which he attributes to neuropathic pain.  He also reports chronic tinnitus.  Denies any ear pain or discharge.  He has hearing aid, and he reports that it helps with tinnitus at times when he uses hearing aid.  Past Medical History:  Diagnosis Date   Anemia    Bypass graft stenosis (HCC)    '06   Cancer (HCC)    Polycythemia vera   Cervical spinal stenosis 05/06/2020   C5-6 level   Chronic gastritis    Coronary  artery disease    Patent Stent to Circ.  Patent coronary bypass grafts with a free radial graft to PDA and LIMA to the  diag.   Diabetes mellitus    Diabetic peripheral neuropathy (HCC) 05/06/2020   Dyslipidemia    Poorly controlled, probably related to his DM (Some of this is secondary to his inability to afford medications).   ED (erectile dysfunction)    Esophageal yeast infection (HCC)    Gastroparesis    Hypertension    Metaplasia of esophagus    Pancreatitis chronic    Pseudocyst   Polycythemia vera(238.4) 04/26/2012   PONV (postoperative nausea and vomiting)     Past Surgical History:  Procedure Laterality Date   APPENDECTOMY     1980   BIOPSY  11/11/2021   Procedure: BIOPSY;  Surgeon: Ruby Corporal, MD;  [facility];  Service: Endoscopy;;   CARDIAC CATHETERIZATION     CHOLECYSTECTOMY     COLONOSCOPY  07/31/1985   COLONOSCOPY N/A 03/11/2016   Procedure: COLONOSCOPY;  Surgeon: Ruby Corporal, MD;  [facility]: [facility];  Service: Endoscopy;  Laterality: N/A;   COLONOSCOPY WITH PROPOFOL  N/A 11/11/2021   Procedure: COLONOSCOPY WITH PROPOFOL ;  Surgeon: Ruby Corporal, MD;  [facility]: [facility];  Service: Endoscopy;  Laterality: N/A;  205   CORONARY ANGIOPLASTY WITH STENT PLACEMENT  08/23/2004   CORONARY ARTERY BYPASS GRAFT  11/24/2004   ESOPHAGOGASTRODUODENOSCOPY  09/19/2001   ESOPHAGOGASTRODUODENOSCOPY N/A 03/11/2016   Procedure: ESOPHAGOGASTRODUODENOSCOPY (EGD);  Surgeon: Ruby Corporal, MD;  [facility];  Service: Endoscopy;  Laterality: N/A;  12:00   ESOPHAGOGASTRODUODENOSCOPY (EGD) WITH PROPOFOL  N/A 11/11/2021   Procedure: ESOPHAGOGASTRODUODENOSCOPY (EGD) WITH PROPOFOL ;  Surgeon: Ruby Corporal, MD;  [facility];  Service: Endoscopy;  Laterality: N/A;   ESOPHAGOGASTRODUODENOSCOPY (EGD) WITH PROPOFOL  N/A 10/25/2023   Procedure: ESOPHAGOGASTRODUODENOSCOPY (EGD) WITH PROPOFOL ;  Surgeon: Urban Garden, MD;   [facility]: [facility];  Service: Gastroenterology;  Laterality: N/A;  10:45am;asa 3   FINGER TENDON REPAIR Bilateral    in middle finger   KNEE SURGERY     left knee   NASAL SEPTUM SURGERY     POLYPECTOMY  11/11/2021   Procedure: POLYPECTOMY;  Surgeon: Ruby Corporal, MD;  [facility]: [facility];  Service: Endoscopy;;    Family History  Problem Relation Age of Onset   Coronary artery disease Mother        strong family history of   Heart attack Mother        dying of (at age 34)   Diabetes Mother    Diabetes Sister    Diabetes Brother    Pancreatitis Brother    Healthy Daughter    Hyperlipidemia Daughter    Heart disease Brother    Hypertension Brother    Hyperlipidemia Brother     Social History   Socioeconomic History   Marital status: Married    Spouse name: Not on file   Number of children: Not on file   Years of education: Not on file   Highest education level: Not on file  Occupational History   Not on file  Tobacco Use   Smoking status: Never   Smokeless tobacco: Never  Vaping Use   Vaping status: Never Used  Substance and Sexual Activity   Alcohol use: Not Currently   Drug use: No   Sexual activity: Not on file  Other Topics Concern   Not on file  Social History Narrative   The patient lives in [place] with his wife.  He is     disabled secondary to coronary artery disease and pancreatitis.  He does     not smoke, he does not drink alcohol.  There is no drug use.    Social Drivers of Corporate investment banker Strain: Low Risk  (11/29/2023)   Overall Financial Resource Strain (CARDIA)    Difficulty of Paying Living Expenses: Not hard at all  Food Insecurity: No Food Insecurity (11/29/2023)   Hunger Vital Sign    Worried About Running Out of Food in the Last Year: Never true    Ran Out of Food in the Last Year: Never true  Transportation Needs: No Transportation Needs (11/29/2023)   PRAPARE - Administrator, Civil Service (Medical):  No    Lack of Transportation (Non-Medical): No  Physical Activity: Inactive (11/29/2023)   Exercise Vital Sign    Days of Exercise per Week: 0 days    Minutes of Exercise per Session: 0 min  Stress: No Stress Concern Present (11/29/2023)   [employer] of Occupational Health - Occupational Stress Questionnaire    Feeling of Stress : Only a little  Social Connections: Moderately Integrated (11/29/2023)   Social Connection and Isolation Panel [NHANES]    Frequency of Communication with Friends and Family: More than three times a week    Frequency of Social Gatherings with Friends and  Family: More than three times a week    Attends Religious Services: More than 4 times per year    Active Member of Clubs or Organizations: No    Attends Banker Meetings: Never    Marital Status: Married  Catering manager Violence: Not At Risk (11/29/2023)   Humiliation, Afraid, Rape, and Kick questionnaire    Fear of Current or Ex-Partner: No    Emotionally Abused: No    Physically Abused: No    Sexually Abused: No    Outpatient Medications Prior to Visit  Medication Sig Dispense Refill   Cholecalciferol 125 MCG (5000 UT) TABS Take 10,000 Units by mouth daily.     clopidogrel  (PLAVIX ) 75 MG tablet TAKE 1 TABLET BY MOUTH ONCE DAILY as a blood thinner 180 tablet 2   Continuous Glucose Receiver (FREESTYLE LIBRE 3 READER) DEVI Use it to check blood glucose as instructed. 1 each 0   Continuous Glucose Sensor (FREESTYLE LIBRE 3 PLUS SENSOR) MISC Change sensor every 15 days. 6 each 3   diazepam  (VALIUM ) 10 MG tablet Take 0.5 tablets (5 mg total) by mouth at bedtime as needed for anxiety or sleep. 30 tablet 0   DULoxetine  (CYMBALTA ) 60 MG capsule TAKE ONE CAPSULE BY MOUTH DAILY 90 capsule 1   ezetimibe  (ZETIA ) 10 MG tablet Take 1 tablet (10 mg total) by mouth daily. 90 tablet 0   gabapentin  (NEURONTIN ) 300 MG capsule TAKE (2) CAPSULES BY MOUTH (3) TIMES DAILY. (Patient taking differently: Take 300 mg  by mouth 2 (two) times daily.) 540 capsule 0   hydrochlorothiazide  (HYDRODIURIL ) 25 MG tablet TAKE 1 TABLET BY MOUTH IN THE MORNING 90 tablet 1   HYDROcodone -acetaminophen  (NORCO) 10-325 MG tablet Take 1 tablet by mouth every 6 (six) hours as needed for severe pain (pain score 7-10). (Patient taking differently: Take 1 tablet by mouth every 6 (six) hours as needed for severe pain (pain score 7-10). 1/2 pill bid.) 120 tablet 0   icosapent  Ethyl (VASCEPA ) 1 g capsule Take 2 capsules (2 g total) by mouth 2 (two) times daily. 120 capsule 11   insulin NPH-regular Human (NOVOLIN  70/30) (70-30) 100 UNIT/ML injection Inject 60 Units into the skin 2 (two) times daily before a meal. When glucose is above 90 and eating     lansoprazole  (PREVACID ) 30 MG capsule Take 1 capsule (30 mg total) by mouth 2 (two) times daily before a meal. 180 capsule 1   losartan  (COZAAR ) 50 MG tablet Take 1 tablet (50 mg total) by mouth daily. 30 tablet 4   metFORMIN  (GLUCOPHAGE -XR) 500 MG 24 hr tablet TAKE 1 TABLET BY MOUTH TWICE DAILY WITH A MEAL 180 tablet 1   methocarbamol  (ROBAXIN ) 500 MG tablet Take 1 tablet (500 mg total) by mouth every 8 (eight) hours as needed for muscle spasms. 30 tablet 0   metoprolol  tartrate (LOPRESSOR ) 25 MG tablet TAKE 1 TABLET BY MOUTH TWICE DAILY 180 tablet 1   nitroGLYCERIN  (NITROSTAT ) 0.4 MG SL tablet Place 0.4 mg under the tongue every 5 (five) minutes as needed for chest pain. Reported on 02/26/2016     NONFORMULARY OR COMPOUNDED ITEM Apply 1 application. topically daily as needed (neuropathy). Compounded cream for foot pain. Gets at [facility].     ondansetron  (ZOFRAN ) 4 MG tablet Take 1 tablet (4 mg total) by mouth every 8 (eight) hours as needed for nausea or vomiting. 30 tablet 0   polyethylene glycol (MIRALAX / GLYCOLAX) 17 g packet Take 17 g by mouth  daily as needed for moderate constipation.     tadalafil  (CIALIS ) 20 MG tablet Take 1 tablet (20 mg total) by mouth daily as needed. 30 tablet  5   tadalafil  (CIALIS ) 5 MG tablet Take 1 tablet (5 mg total) by mouth daily as needed for erectile dysfunction. 30 tablet 11   diphenhydrAMINE  (BENADRYL ) 50 MG capsule Take one tablet one hour prior to CT 1 capsule 0   predniSONE  (DELTASONE ) 50 MG tablet Take prednisone   tablet 13 hours before CT scan, then one tablet 7 hours before CT, then one tablet one hour prior to CT 3 tablet 0   No facility-administered medications prior to visit.    Allergies  Allergen Reactions   Iodinated Contrast Media Hives, Itching and Nausea Only    Flushing, Burning, itching Other reaction(s): Angioedema (ALLERGY/intolerance)    Iohexol  Hives and Shortness Of Breath     Desc: HIVES,SOB NEEDS PREP.MEDS    Metoclopramide Hcl Hives    Patient became very spastic   Alfuzosin  Hcl Er     Dropped blood pressure   Sulfonamide Derivatives Hives    Large hives   Enalapril  Cough   Penicillins Rash    Has patient had a PCN reaction causing immediate rash, facial/tongue/throat swelling, SOB or lightheadedness with hypotension: Yes Has patient had a PCN reaction causing severe rash involving mucus membranes or skin necrosis: No Has patient had a PCN reaction that required hospitalization No Has patient had a PCN reaction occurring within the last 10 years: No. If all of the above answers are "NO", then may proceed with Cephalosporin use.    Sulfa Antibiotics Rash    Other Reaction: Not Assessed    ROS Review of Systems  Constitutional:  Negative for chills and fever.  HENT:  Positive for congestion and tinnitus.        Hearing difficulty  Eyes:  Negative for pain and discharge.  Respiratory:  Positive for cough. Negative for shortness of breath.   Cardiovascular:  Negative for palpitations.  Gastrointestinal:  Negative for constipation, diarrhea, nausea and vomiting.  Endocrine: Negative for polydipsia and polyuria.  Genitourinary:  Positive for difficulty urinating. Negative for hematuria.   Musculoskeletal:  Positive for arthralgias, back pain and neck pain. Negative for neck stiffness.  Skin:  Negative for rash.  Neurological:  Positive for numbness (B/l LE, intermittent). Negative for dizziness, weakness and headaches.  Psychiatric/Behavioral:  Negative for agitation and behavioral problems.       Objective:    Physical Exam Vitals reviewed.  Constitutional:      General: He is not in acute distress.    Appearance: He is not diaphoretic.  HENT:     Head: Normocephalic and atraumatic.     Nose: Congestion present.     Mouth/Throat:     Mouth: Mucous membranes are moist.  Eyes:     General: No scleral icterus.    Extraocular Movements: Extraocular movements intact.  Cardiovascular:     Rate and Rhythm: Normal rate and regular rhythm.     Heart sounds: Normal heart sounds. No murmur heard. Pulmonary:     Breath sounds: Normal breath sounds. No wheezing or rales.  Musculoskeletal:     Cervical back: Neck supple. No tenderness.     Right lower leg: No edema.     Left lower leg: No edema.  Skin:    General: Skin is warm.     Findings: No rash.  Neurological:     General: No focal deficit present.  Mental Status: He is alert and oriented to person, place, and time.     Cranial Nerves: No cranial nerve deficit.     Sensory: Sensory deficit (B/l feet) present.     Motor: No weakness.  Psychiatric:        Mood and Affect: Mood normal.        Behavior: Behavior normal.     BP 128/77   Pulse 85   Ht 5' 9.5" (1.765 m)   Wt 170 lb 9.6 oz (77.4 kg)   SpO2 96%   BMI 24.83 kg/m  Wt Readings from Last 3 Encounters:  12/06/23 170 lb 9.6 oz (77.4 kg)  11/29/23 172 lb (78 kg)  11/24/23 177 lb 9.6 oz (80.6 kg)    Lab Results  Component Value Date   TSH 3.110 12/30/2022   Lab Results  Component Value Date   WBC 6.0 08/12/2023   HGB 18.0 (H) 08/12/2023   HCT 54.1 (H) 08/12/2023   MCV 84.3 08/12/2023   PLT 234 08/12/2023   Lab Results  Component  Value Date   NA 140 08/12/2023   K 3.9 08/12/2023   CO2 30 08/12/2023   GLUCOSE 108 08/12/2023   BUN 20 08/12/2023   CREATININE 1.29 08/12/2023   BILITOT 0.6 08/12/2023   ALKPHOS 71 07/04/2023   AST 19 08/12/2023   ALT 15 08/12/2023   PROT 6.7 08/12/2023   ALBUMIN 4.5 07/04/2023   CALCIUM  9.6 08/12/2023   ANIONGAP 13 11/06/2021   EGFR 63 08/12/2023   Lab Results  Component Value Date   CHOL 261 (H) 07/04/2023   Lab Results  Component Value Date   HDL 45 07/04/2023   Lab Results  Component Value Date   LDLCALC 142 (H) 07/04/2023   Lab Results  Component Value Date   TRIG 405 (H) 07/04/2023   Lab Results  Component Value Date   CHOLHDL 5.8 (H) 07/04/2023   Lab Results  Component Value Date   HGBA1C 8.8 (H) 07/04/2023      Assessment & Plan:   Problem List Items Addressed This Visit       Cardiovascular and Mediastinum   Essential hypertension, benign - Primary   BP Readings from Last 1 Encounters:  12/06/23 128/77   Usually well-controlled with Metoprolol , Losartan  and HCTZ Counseled for compliance with the medications Advised DASH diet and moderate exercise/walking, at least 150 mins/week      Coronary artery disease of native artery of native heart with stable angina pectoris (HCC)   On Plavix  and B-blocker Nitroglycerin  PRN for chest pain Follows up with Cardiologist      Erythromelalgia (HCC)   JAK2 negative Gets venipuncture every 3-4 months F/u Heme/Onc.        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 Was on pancrelipase  Follows up with GI        Endocrine   Diabetic peripheral neuropathy (HCC)   Long-standing, severe b/l LE pain at times On Gabapentin  600 mg TID and Norco PRN On Cymbalta  60 mg QD SSRI and Valium  for chronic pain and anxiety related to neuropathy Has Gabapentin  cream for neuropathy      Relevant Medications    insulin isophane & regular human KwikPen (NOVOLIN  70/30 KWIKPEN) (70-30) 100 UNIT/ML KwikPen   Type 2 diabetes mellitus with diabetic neuropathy, with long-term current use of insulin (HCC)   Lab Results  Component Value Date   HGBA1C 8.8 (  H) 07/04/2023   Uncontrolled On Novolin  70/30 - 60 U BID and Metformin  500 mg BID, used to f/u with Dr Monte Antonio Needs to take Novolin  regularly - switched to pen form, advised to take 48 Units at evening if blood glucose between 100-150 instead of taking random dose If persistent, will need to switch to Tresiba or Toujeo with ISS Advised to follow diabetic diet -has joined diabetes reversal group Not on any statin due to statin intolerance Diabetic eye exam: Advised to follow up with Ophthalmology for diabetic eye exam      Relevant Medications   insulin isophane & regular human KwikPen (NOVOLIN  70/30 KWIKPEN) (70-30) 100 UNIT/ML KwikPen   Other Relevant Orders   CMP14+EGFR   Hemoglobin A1c   Urine Microalbumin w/creat. ratio     Genitourinary   Stage 3a chronic kidney disease (HCC)   Last BMP reviewed, stable GFR around 50 Advised to maintain adequate hydration On losartan  Avoid nephrotoxic agents      Relevant Orders   CBC with Differential/Platelet   CMP14+EGFR   Urine Microalbumin w/creat. ratio     Other   Polycythemia vera (HCC)   JAK2 negative Gets venipuncture every 3-4 months F/u Heme/Onc. Check CBC His Hb has been stable recently without venipuncture, denies any recent signs of bleeding      Depression, recurrent (HCC)   Well-controlled, on Cymbalta  now, was on Paxil  Since he has uncontrolled peripheral neuropathy, switched to Cymbalta  60 mg QD        Meds ordered this encounter  Medications   insulin isophane & regular human KwikPen (NOVOLIN  70/30 KWIKPEN) (70-30) 100 UNIT/ML KwikPen    Sig: Inject 60 Units into the skin 2 (two) times daily.    Dispense:  45 mL    Refill:  5    Follow-up: Return in about 4 months  (around 04/06/2024) for DM and HTN.    Meldon Sport, MD

## 2023-12-06 NOTE — Assessment & Plan Note (Signed)
Last BMP reviewed, stable GFR around 50 Advised to maintain adequate hydration On losartan Avoid nephrotoxic agents

## 2023-12-06 NOTE — Assessment & Plan Note (Addendum)
 Well-controlled, on Cymbalta  now, was on Paxil  Since he has uncontrolled peripheral neuropathy, switched to Cymbalta  60 mg QD

## 2023-12-06 NOTE — Assessment & Plan Note (Signed)
JAK2 negative Gets venipuncture every 3-4 months F/u Heme/Onc.

## 2023-12-06 NOTE — Assessment & Plan Note (Signed)
On Plavix and B-blocker ?Nitroglycerin PRN for chest pain ?Follows up with Cardiologist ?

## 2023-12-06 NOTE — Assessment & Plan Note (Signed)
 BP Readings from Last 1 Encounters:  12/06/23 128/77   Usually well-controlled with Metoprolol, Losartan and HCTZ Counseled for compliance with the medications Advised DASH diet and moderate exercise/walking, at least 150 mins/week

## 2023-12-08 ENCOUNTER — Other Ambulatory Visit: Payer: Self-pay | Admitting: Internal Medicine

## 2023-12-08 DIAGNOSIS — E1140 Type 2 diabetes mellitus with diabetic neuropathy, unspecified: Secondary | ICD-10-CM | POA: Diagnosis not present

## 2023-12-08 MED ORDER — PEN NEEDLES 32G X 4 MM MISC: 3 refills | Status: DC

## 2023-12-09 NOTE — Assessment & Plan Note (Signed)
 Long-standing, severe b/l LE pain at times On Gabapentin  600 mg TID and Norco PRN On Cymbalta  60 mg QD SSRI and Valium  for chronic pain and anxiety related to neuropathy Has Gabapentin  cream for neuropathy

## 2023-12-09 NOTE — Assessment & Plan Note (Signed)
 JAK2 negative Gets venipuncture every 3-4 months F/u Heme/Onc. Check CBC His Hb has been stable recently without venipuncture, denies any recent signs of bleeding

## 2023-12-21 ENCOUNTER — Other Ambulatory Visit: Payer: Self-pay | Admitting: Internal Medicine

## 2023-12-21 DIAGNOSIS — E782 Mixed hyperlipidemia: Secondary | ICD-10-CM

## 2024-01-12 ENCOUNTER — Encounter (HOSPITAL_BASED_OUTPATIENT_CLINIC_OR_DEPARTMENT_OTHER): Payer: Self-pay | Admitting: Internal Medicine

## 2024-01-13 ENCOUNTER — Institutional Professional Consult (permissible substitution) (HOSPITAL_BASED_OUTPATIENT_CLINIC_OR_DEPARTMENT_OTHER): Payer: Medicare Other | Admitting: Internal Medicine

## 2024-01-17 ENCOUNTER — Other Ambulatory Visit: Payer: Self-pay | Admitting: Internal Medicine

## 2024-01-17 DIAGNOSIS — E1140 Type 2 diabetes mellitus with diabetic neuropathy, unspecified: Secondary | ICD-10-CM

## 2024-01-18 ENCOUNTER — Other Ambulatory Visit: Payer: Self-pay | Admitting: Internal Medicine

## 2024-01-18 ENCOUNTER — Encounter: Payer: Self-pay | Admitting: Internal Medicine

## 2024-01-18 DIAGNOSIS — E1142 Type 2 diabetes mellitus with diabetic polyneuropathy: Secondary | ICD-10-CM

## 2024-01-18 MED ORDER — HYDROCODONE-ACETAMINOPHEN 10-325 MG PO TABS: 1.0000 | ORAL_TABLET | Freq: Four times a day (QID) | ORAL | 0 refills | Status: DC | PRN

## 2024-01-26 ENCOUNTER — Encounter: Payer: Self-pay | Admitting: Internal Medicine

## 2024-01-29 ENCOUNTER — Other Ambulatory Visit: Payer: Self-pay | Admitting: Internal Medicine

## 2024-01-29 DIAGNOSIS — G4701 Insomnia due to medical condition: Secondary | ICD-10-CM

## 2024-01-29 MED ORDER — DIAZEPAM 10 MG PO TABS: 5.0000 mg | ORAL_TABLET | Freq: Every evening | ORAL | 0 refills | Status: AC | PRN

## 2024-01-30 ENCOUNTER — Ambulatory Visit: Payer: Medicare Other | Admitting: Cardiology

## 2024-02-01 ENCOUNTER — Encounter: Payer: Self-pay | Admitting: Cardiology

## 2024-02-05 ENCOUNTER — Other Ambulatory Visit: Payer: Self-pay | Admitting: Internal Medicine

## 2024-02-05 DIAGNOSIS — I10 Essential (primary) hypertension: Secondary | ICD-10-CM

## 2024-02-06 DIAGNOSIS — E785 Hyperlipidemia, unspecified: Secondary | ICD-10-CM | POA: Insufficient documentation

## 2024-02-06 NOTE — Progress Notes (Deleted)
  Cardiology Office Note:   Date:  02/06/2024  ID:  [name], DOB Dec 19, 1960, MRN [id number] PCP: Meldon Sport, MD  [facility] Providers Cardiologist:  Eilleen Grates, MD {  History of Present Illness:   [name] is a 63 y.o. male who presents for followup of his known coronary disease.  In 2016 he had chest pain but stress perfusion study demonstrated an EF of 60% with no ischemia.  He had an echo in Jan 2021.  He had a perfusion study IN jAN 2023 This was normal.   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  Although he does have a low iron diet and currently is not needing this..     Since I last saw him he has done well.  ***     ***  He says he has been doing well.  He has been doing swimming and walking and biking at times.  If he does a lot of yard work he might get some chest discomfort.  He gets emotionally stressed he gets some chest discomfort.  However, he thinks this is the same or better than it was when he had his low risk stress perfusion study in 2023.  He has taken occasional nitroglycerin  but he says this is a reduced number compared to what he was taking.  ROS: ***  Studies Reviewed:    EKG:       ***  Risk Assessment/Calculations:   {Does this patient have ATRIAL FIBRILLATION?[phone]} No BP recorded.  {Refresh Note OR Click here to enter BP  :1}***        Physical Exam:   VS:  There were no vitals taken for this visit.   Wt Readings from Last 3 Encounters:  12/06/23 170 lb 9.6 oz (77.4 kg)  11/29/23 172 lb (78 kg)  11/24/23 177 lb 9.6 oz (80.6 kg)     GEN: Well nourished, well developed in no acute distress NECK: No JVD; No carotid bruits CARDIAC: ***RR, *** murmurs, rubs, gallops RESPIRATORY:  Clear to auscultation without rales, wheezing or rhonchi  ABDOMEN: Soft, non-tender, non-distended EXTREMITIES:  No edema; No deformity   ASSESSMENT AND PLAN:   CAD:  ***   The patient has no  new sypmtoms.  No further cardiovascular testing is indicated.  We will continue with aggressive risk reduction and meds as listed.   HTN:   ***   The blood pressure is controlled.  No change in therapy.     HYPERLIPIDEMIA:    ***    He has not tolerated PCSK9 inhibitors or statins.  He has significant hypertriglyceridemia.  We could not get him bempedoic acid .  He is taking Zetia .  He said he has dramatically changed his diet and he has lost about 9 pounds.  I am going to go ahead and check a lipid profile again in about 2 months if his triglycerides are still high he would agree to see Dr. Maximo Spar.     DM: His last A1C was ***   7.7.  This down from 8.6.  Plan per his primary provider     Follow up ***  Signed, Eilleen Grates, MD

## 2024-02-07 ENCOUNTER — Encounter: Payer: Self-pay | Admitting: Cardiology

## 2024-02-08 ENCOUNTER — Ambulatory Visit: Payer: Medicare Other | Admitting: Cardiology

## 2024-02-08 DIAGNOSIS — E785 Hyperlipidemia, unspecified: Secondary | ICD-10-CM

## 2024-02-08 DIAGNOSIS — I25118 Atherosclerotic heart disease of native coronary artery with other forms of angina pectoris: Secondary | ICD-10-CM

## 2024-02-08 DIAGNOSIS — E118 Type 2 diabetes mellitus with unspecified complications: Secondary | ICD-10-CM

## 2024-02-08 DIAGNOSIS — I10 Essential (primary) hypertension: Secondary | ICD-10-CM

## 2024-02-09 ENCOUNTER — Ambulatory Visit: Payer: Medicare Other | Admitting: Cardiology

## 2024-02-13 ENCOUNTER — Ambulatory Visit: Payer: Self-pay

## 2024-02-13 ENCOUNTER — Telehealth

## 2024-02-13 ENCOUNTER — Encounter: Payer: Self-pay | Admitting: Internal Medicine

## 2024-02-13 ENCOUNTER — Telehealth (INDEPENDENT_AMBULATORY_CARE_PROVIDER_SITE_OTHER): Admitting: Internal Medicine

## 2024-02-13 DIAGNOSIS — J0101 Acute recurrent maxillary sinusitis: Secondary | ICD-10-CM | POA: Insufficient documentation

## 2024-02-13 DIAGNOSIS — J309 Allergic rhinitis, unspecified: Secondary | ICD-10-CM | POA: Diagnosis not present

## 2024-02-13 MED ORDER — AZITHROMYCIN 250 MG PO TABS: ORAL_TABLET | ORAL | 0 refills | Status: AC

## 2024-02-13 NOTE — Assessment & Plan Note (Signed)
 Has history of allergic rhinitis Continue Zyrtec or Xyzal Flonase for nasal congestion Advised to use humidifier and/or vaporizer for nasal congestion

## 2024-02-13 NOTE — Telephone Encounter (Signed)
 FYI Only or Action Required?: Action required by provider: clinical question for provider.  Patient was last seen in primary care on 12/06/2023 by [name], MD. Called Nurse Triage reporting Facial Pain. Symptoms began several days ago. Interventions attempted: Rest, hydration, or home remedies. Symptoms are: gradually worsening. Sinus symptoms started over the weekend, feels terrible. Asking to be worked in. [facility] Appointment made.  Triage Disposition: See HCP Within 4 Hours (Or PCP Triage)  Patient/caregiver understands and will follow disposition?: Yes

## 2024-02-13 NOTE — Progress Notes (Signed)
 Virtual Visit via Video Note   Because of Yaphet Smethurst Gladson's co-morbid illnesses, he is at least at moderate risk for complications without adequate follow up.  This format is felt to be most appropriate for this patient at this time.  All issues noted in this document were discussed and addressed.  A limited physical exam was performed with this format.      Evaluation Performed:  Follow-up visit  Date:  02/13/2024   ID:  Davyn, Morandi September 16, 1960, MRN [id number]  Patient Location: Home Provider Location: Office/Clinic  Participants: Patient Location of Patient: Home Location of Provider: Telehealth Consent was obtain for visit to be over via telehealth. I verified that I am speaking with the correct person using two identifiers.  PCP:  [name], MD   Chief Complaint: Sinus pressure related facial pain  History of Present Illness:    PARNELL SPIELER is a 63 y.o. male who has a video visit for complaint of nasal congestion, sinus pressure related headache and facial pain and sore throat for the last 3 days.  He has felt warm, but denies any episode of fever or chills.  He has tried using nasal saline spray and Navage without much relief.  Denies any recent sick contacts.  The patient does not have symptoms concerning for COVID-19 infection (fever, chills, cough, or new shortness of breath).   Past Medical, Surgical, Social History, Allergies, and Medications have been Reviewed.  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  Patent coronary bypass grafts with a free radial graft to PDA and LIMA to the  diag.   Diabetes mellitus    Diabetic peripheral neuropathy (HCC) 05/06/2020   Dyslipidemia    Poorly controlled, probably related to his DM (Some of this is  secondary to his inability to afford medications).   ED (erectile dysfunction)    Esophageal yeast infection (HCC)    Gastroparesis    Hypertension    Metaplasia of esophagus    Pancreatitis chronic    Pseudocyst   Polycythemia vera(238.4) 04/26/2012   PONV (postoperative nausea and vomiting)    Past Surgical History:  Procedure Laterality Date   APPENDECTOMY     1980   BIOPSY  11/11/2021   Procedure: BIOPSY;  Surgeon: Golda Claudis PENNER, MD;  Location: [facility];  Service: Endoscopy;;   CARDIAC CATHETERIZATION     CHOLECYSTECTOMY     COLONOSCOPY  07/31/1985   COLONOSCOPY N/A 03/11/2016   Procedure: COLONOSCOPY;  Surgeon: Claudis PENNER Golda, MD;  Location: [facility];  Service: Endoscopy;  Laterality: N/A;   COLONOSCOPY WITH PROPOFOL  N/A 11/11/2021   Procedure: COLONOSCOPY WITH PROPOFOL ;  Surgeon: Golda Claudis PENNER, MD;  Location: [facility];  Service: Endoscopy;  Laterality: N/A;  205   CORONARY ANGIOPLASTY WITH STENT PLACEMENT  08/23/2004   CORONARY ARTERY BYPASS GRAFT  11/24/2004   ESOPHAGOGASTRODUODENOSCOPY  09/19/2001   ESOPHAGOGASTRODUODENOSCOPY N/A 03/11/2016   Procedure: ESOPHAGOGASTRODUODENOSCOPY (EGD);  Surgeon: Claudis PENNER Golda, MD;  Location: [facility];  Service: Endoscopy;  Laterality: N/A;  12:00   ESOPHAGOGASTRODUODENOSCOPY (EGD) WITH PROPOFOL  N/A 11/11/2021   Procedure: ESOPHAGOGASTRODUODENOSCOPY (EGD) WITH PROPOFOL ;  Surgeon: Golda Claudis PENNER, MD;  [facility]: [facility];  Service: Endoscopy;  Laterality: N/A;   ESOPHAGOGASTRODUODENOSCOPY (EGD) WITH PROPOFOL  N/A 10/25/2023   Procedure: ESOPHAGOGASTRODUODENOSCOPY (EGD) WITH PROPOFOL ;  Surgeon: Eartha Angelia Sieving, MD;  Location: [facility];  Service: Gastroenterology;  Laterality: N/A;  10:45am;asa 3   FINGER TENDON REPAIR Bilateral    in middle finger   KNEE SURGERY     left knee   NASAL SEPTUM SURGERY     POLYPECTOMY  11/11/2021   Procedure: POLYPECTOMY;  Surgeon: Golda Claudis PENNER, MD;   Location: [facility];  Service: Endoscopy;;     Current Meds  Medication Sig   azithromycin (ZITHROMAX) 250 MG tablet Take 2 tablets on day 1, then 1 tablet daily on days 2 through 5     Allergies:   Iodinated contrast media, Iohexol , Metoclopramide hcl, Alfuzosin  hcl er, Sulfonamide derivatives, Enalapril , Penicillins, and Sulfa antibiotics   ROS:   Please see the history of present illness. All other systems reviewed and are negative.   Labs/Other Tests and Data Reviewed:    Recent Labs: 08/12/2023: ALT 15; BUN 20; Creat 1.29; Hemoglobin 18.0; Platelets 234; Potassium 3.9; Sodium 140   Recent Lipid Panel Lab Results  Component Value Date/Time   CHOL 261 (H) 07/04/2023 02:37 PM   TRIG 405 (H) 07/04/2023 02:37 PM   HDL 45 07/04/2023 02:37 PM   CHOLHDL 5.8 (H) 07/04/2023 02:37 PM   CHOLHDL NOT REPORTED DUE TO HIGH TRIGLYCERIDES 09/29/2021 03:31 PM   LDLCALC 142 (H) 07/04/2023 02:37 PM   LDLDIRECT 110 08/19/2016 12:59 PM    Wt Readings from Last 3 Encounters:  12/06/23 170 lb 9.6 oz (77.4 kg)  11/29/23 172 lb (78 kg)  11/24/23 177 lb 9.6 oz (80.6 kg)     Objective:    Vital Signs:  There were no vitals taken for this visit.   VITAL SIGNS:  reviewed GEN:  no acute distress EYES:  sclerae anicteric, EOMI - Extraocular Movements Intact RESPIRATORY:  normal respiratory effort, symmetric expansion NEURO:  alert and oriented x 3, no obvious focal deficit PSYCH:  normal affect  ASSESSMENT & PLAN:    Acute recurrent maxillary sinusitis Started empiric azithromycin as he has persistent symptoms despite symptomatic treatment and considering his chronic medical conditions Continue nasal saline spray or Flonase as needed for nasal congestion Uses Navage for sinus irrigation  Allergic rhinitis Has history of allergic rhinitis Continue Zyrtec or Xyzal Flonase for nasal congestion Advised to use humidifier and/or vaporizer for nasal congestion   I discussed the  assessment and treatment plan with the patient. The patient was provided an opportunity to ask questions, and all were answered. The patient agreed with the plan and demonstrated an understanding of the instructions.   The patient was advised to call back or seek an in-person evaluation if the symptoms worsen or if the condition fails to improve as anticipated.  The above assessment and management plan was discussed with the patient. The patient verbalized understanding of and has agreed to the management plan.   Medication Adjustments/Labs and Tests Ordered: Current medicines are reviewed at length with the patient today.  Concerns regarding medicines are outlined above.   Tests Ordered: No orders of the defined types were placed in this encounter.   Medication Changes: Meds ordered this encounter  Medications   azithromycin (ZITHROMAX) 250 MG tablet    Sig: Take 2 tablets on day 1, then 1 tablet daily on days 2 through 5    Dispense:  6 tablet  Refill:  0     Note: This dictation was prepared with Dragon dictation along with smaller phrase technology. Similar sounding words can be transcribed inadequately or may not be corrected upon review. Any transcriptional errors that result from this process are unintentional.      Disposition:  Follow up  Signed, Suzzane MARLA Blanch, MD  02/13/2024 3:59 PM     [facility]

## 2024-02-13 NOTE — Assessment & Plan Note (Signed)
 Started empiric azithromycin as he has persistent symptoms despite symptomatic treatment and considering his chronic medical conditions Continue nasal saline spray or Flonase as needed for nasal congestion Uses Navage for sinus irrigation

## 2024-02-13 NOTE — Patient Instructions (Signed)
 Please start taking azithromycin as prescribed.  Please continue using nasal saline spray as needed for nasal congestion.

## 2024-02-13 NOTE — Telephone Encounter (Signed)
       Copied from CRM [id number]. Topic: Clinical - Red Word Triage >> Feb 13, 2024  1:38 PM Nathanel BROCKS wrote: Red Word that prompted transfer to Nurse Triage: sinus infection, facial pain and sore throat Reason for Disposition  [1] SEVERE pain AND [2] not improved 2 hours after pain medicine  Answer Assessment - Initial Assessment Questions 1. LOCATION: Where does it hurt?      Face 2. ONSET: When did the sinus pain start?  (e.g., hours, days)      weekend 3. SEVERITY: How bad is the pain?   (Scale 1-10; mild, moderate or severe)   - MILD (1-3): doesn't interfere with normal activities    - MODERATE (4-7): interferes with normal activities (e.g., work or school) or awakens from sleep   - SEVERE (8-10): excruciating pain and patient unable to do any normal activities        12 4. RECURRENT SYMPTOM: Have you ever had sinus problems before? If Yes, ask: When was the last time? and What happened that time?      yes 5. NASAL CONGESTION: Is the nose blocked? If Yes, ask: Can you open it or must you breathe through your mouth?     no 6. NASAL DISCHARGE: Do you have discharge from your nose? If so ask, What color?     clear 7. FEVER: Do you have a fever? If Yes, ask: What is it, how was it measured, and when did it start?      no 8. OTHER SYMPTOMS: Do you have any other symptoms? (e.g., sore throat, cough, earache, difficulty breathing)     Sore throat 9. PREGNANCY: Is there any chance you are pregnant? When was your last menstrual period?     N/a  Protocols used: Sinus Pain or Congestion-A-AH

## 2024-02-20 ENCOUNTER — Institutional Professional Consult (permissible substitution) (HOSPITAL_BASED_OUTPATIENT_CLINIC_OR_DEPARTMENT_OTHER): Admitting: Internal Medicine

## 2024-03-02 ENCOUNTER — Other Ambulatory Visit: Payer: Self-pay | Admitting: Cardiology

## 2024-03-02 DIAGNOSIS — E785 Hyperlipidemia, unspecified: Secondary | ICD-10-CM

## 2024-03-11 ENCOUNTER — Other Ambulatory Visit: Payer: Self-pay | Admitting: Internal Medicine

## 2024-03-11 DIAGNOSIS — I10 Essential (primary) hypertension: Secondary | ICD-10-CM

## 2024-03-19 DIAGNOSIS — D751 Secondary polycythemia: Secondary | ICD-10-CM | POA: Diagnosis not present

## 2024-03-21 ENCOUNTER — Ambulatory Visit: Admitting: Urology

## 2024-03-21 ENCOUNTER — Encounter: Payer: Self-pay | Admitting: Urology

## 2024-03-21 ENCOUNTER — Ambulatory Visit: Payer: Medicare Other | Admitting: Urology

## 2024-03-26 DIAGNOSIS — D751 Secondary polycythemia: Secondary | ICD-10-CM | POA: Diagnosis not present

## 2024-03-30 ENCOUNTER — Encounter: Payer: Self-pay | Admitting: *Deleted

## 2024-04-09 ENCOUNTER — Encounter: Payer: Self-pay | Admitting: Internal Medicine

## 2024-04-09 ENCOUNTER — Ambulatory Visit (INDEPENDENT_AMBULATORY_CARE_PROVIDER_SITE_OTHER): Admitting: Internal Medicine

## 2024-04-09 VITALS — BP 134/84 | HR 101 | Ht 69.5 in | Wt 170.0 lb

## 2024-04-09 DIAGNOSIS — B351 Tinea unguium: Secondary | ICD-10-CM | POA: Insufficient documentation

## 2024-04-09 DIAGNOSIS — E1140 Type 2 diabetes mellitus with diabetic neuropathy, unspecified: Secondary | ICD-10-CM

## 2024-04-09 DIAGNOSIS — F339 Major depressive disorder, recurrent, unspecified: Secondary | ICD-10-CM | POA: Diagnosis not present

## 2024-04-09 DIAGNOSIS — E782 Mixed hyperlipidemia: Secondary | ICD-10-CM

## 2024-04-09 DIAGNOSIS — E1142 Type 2 diabetes mellitus with diabetic polyneuropathy: Secondary | ICD-10-CM | POA: Diagnosis not present

## 2024-04-09 DIAGNOSIS — N1831 Chronic kidney disease, stage 3a: Secondary | ICD-10-CM

## 2024-04-09 DIAGNOSIS — L84 Corns and callosities: Secondary | ICD-10-CM | POA: Diagnosis not present

## 2024-04-09 DIAGNOSIS — I10 Essential (primary) hypertension: Secondary | ICD-10-CM | POA: Diagnosis not present

## 2024-04-09 DIAGNOSIS — Z0001 Encounter for general adult medical examination with abnormal findings: Secondary | ICD-10-CM

## 2024-04-09 DIAGNOSIS — Z794 Long term (current) use of insulin: Secondary | ICD-10-CM

## 2024-04-09 DIAGNOSIS — M4802 Spinal stenosis, cervical region: Secondary | ICD-10-CM

## 2024-04-09 MED ORDER — LOSARTAN POTASSIUM 50 MG PO TABS: 50.0000 mg | ORAL_TABLET | Freq: Every day | ORAL | 3 refills | Status: AC

## 2024-04-09 MED ORDER — GABAPENTIN 300 MG PO CAPS: 300.0000 mg | ORAL_CAPSULE | Freq: Three times a day (TID) | ORAL | 1 refills | Status: AC

## 2024-04-09 NOTE — Assessment & Plan Note (Signed)
Physical exam as documented. Fasting blood tests today. Advised to get Shingrix and Tdap vaccines at local pharmacy. 

## 2024-04-09 NOTE — Assessment & Plan Note (Signed)
 F/u with Spine surgery On Gabapentin  and Cymbalta  for chronic neuropathy pain

## 2024-04-09 NOTE — Assessment & Plan Note (Addendum)
 Lab Results  Component Value Date   HGBA1C 8.8 (H) 07/04/2023   Uncontrolled, check HbA1c On Novolin  70/30 - 60 U QAM and 48 U QHS, and Metformin  500 mg BID, used to f/u with Dr Lenis Needs to take Novolin  regularly - switched to pen form, advised to take 48 Units at evening if blood glucose between 100-150 instead of taking random dose If persistent, will need to switch to Tresiba or Toujeo with ISS Advised to follow diabetic diet -has joined diabetes reversal group Not on any statin due to statin intolerance Diabetic eye exam: Advised to follow up with Ophthalmology for diabetic eye exam

## 2024-04-09 NOTE — Assessment & Plan Note (Addendum)
 Well-controlled, on Cymbalta  now, was on Paxil  Since he has uncontrolled peripheral neuropathy, had switched to Cymbalta  60 mg QD

## 2024-04-09 NOTE — Assessment & Plan Note (Signed)
Continue Zetia and Vascepa Intolerant to statin and Repatha  May try Praluent, discuss with Cardiology 

## 2024-04-09 NOTE — Assessment & Plan Note (Signed)
 Advised to try Lamisil cream for now Due to his history of pancreatitis, would prefer to avoid oral terbinafine for now If he has persistent fungal infection, will cautiously use oral terbinafine

## 2024-04-09 NOTE — Patient Instructions (Signed)
 Please apply Lamisil 1% cream for toenail fungal infection.  Please continue to take medications as prescribed.  Please continue to follow low carb diet and perform moderate exercise/walking at least 150 mins/week.

## 2024-04-09 NOTE — Assessment & Plan Note (Signed)
Last BMP reviewed, stable GFR around 50 Advised to maintain adequate hydration On losartan Avoid nephrotoxic agents

## 2024-04-09 NOTE — Assessment & Plan Note (Addendum)
 Long-standing, severe b/l LE pain at times On Gabapentin  600 mg TID and Norco PRN On Cymbalta  60 mg QD Valium  for chronic pain and anxiety related to neuropathy Has Gabapentin  cream for neuropathy

## 2024-04-09 NOTE — Assessment & Plan Note (Signed)
 BP Readings from Last 1 Encounters:  04/09/24 134/84   Usually well-controlled with Metoprolol , Losartan  and HCTZ Counseled for compliance with the medications Advised DASH diet and moderate exercise/walking, at least 150 mins/week

## 2024-04-09 NOTE — Assessment & Plan Note (Signed)
 Has a callus on left foot Advised to use callus cap or salicylic acid solution If persistent, will require podiatry evaluation

## 2024-04-09 NOTE — Progress Notes (Signed)
 Established Patient Office Visit  Subjective:  Patient ID: [name], male    DOB: [birth year]  Age: 63 y.o. MRN: [id number]  CC:  Chief Complaint  Patient presents with   Medical Management of Chronic Issues    Follow up.    Shoulder Pain    Reports spinal stenosis dx on c5 and c6.     HPI [name] is a 63 y.o. male with past medical history of CAD status post CABG, hypertension, uncontrolled diabetes mellitus-on insulin, severe diabetic neuropathy, chronic pancreatitis, polycythemia vera, hyperlipidemia, cervical spinal stenosis and chronic back pain who presents for f/u of his chronic medical conditions.  HTN: BP is wnl today. Takes medications regularly. Patient denies headache, dizziness, chest pain, dyspnea or palpitations currently.   DM: HbA1C was 8.8 in 11/24. He used to see Dr Lenis, but has stopped seeing him now. His insulin dose was adjusted to 60 U QAM and 48 U QPM in the last visit.  He reports that he still takes lesser dose in the evening at times if blood glucose is close to 150.  He also takes Metformin  500 mg twice daily. Denies any polyuria or polyphagia currently.  He has CGM device, and his blood glucose runs around 150-200 most of the time, except post-meal readings.  Diabetic neuropathy: Takes gabapentin  and as needed Norco for severe pain.  He has tried gabapentin  cream as well for neuropathy. He continues to have severe burning pain in his feet, but Cymbalta  has helped somewhat now.  MDD: He takes Cymbalta  for it.  Currently denies any apathy, SI or HI currently.  He was switched to Cymbalta  from Paxil  for better neuropathic pain coverage.  He reports chronic insomnia, which he attributes to neuropathic pain.  He also reports chronic tinnitus.  Denies any ear pain or discharge.  He has hearing aid, and he reports that it helps with tinnitus at times when he uses hearing aid.  He reports chronic bilateral shoulder pain, has a history of OA of shoulder  and bursitis as well.  Followed by orthopedic surgeon.  He also has h/o cervical spinal stenosis, currently takes gabapentin  for it.  He also reports thick and yellowish discoloration of left great toenail.  He has slight yellowish discoloration of second toenail of the left foot as well.  He has a callus on the left foot base as well.  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  Patent coronary bypass grafts with a free radial graft to PDA and LIMA to the  diag.   Diabetes mellitus    Diabetic peripheral neuropathy (HCC) 05/06/2020   Dyslipidemia    Poorly controlled, probably related to his DM (Some of this is secondary to his inability to afford medications).   ED (erectile dysfunction)    Esophageal yeast infection (HCC)    Gastroparesis    Hypertension    Metaplasia of esophagus    Pancreatitis chronic    Pseudocyst   Polycythemia vera(238.4) 04/26/2012   PONV (postoperative nausea and vomiting)     Past Surgical History:  Procedure Laterality Date   APPENDECTOMY     1980   BIOPSY  11/11/2021   Procedure: BIOPSY;  Surgeon: Golda,  Claudis PENNER, MD;  [facility];  Service: Endoscopy;;   CARDIAC CATHETERIZATION     CHOLECYSTECTOMY     COLONOSCOPY  07/31/1985   COLONOSCOPY N/A 03/11/2016   Procedure: COLONOSCOPY;  Surgeon: Claudis PENNER Rivet, MD;  [facility]: [facility];  Service: Endoscopy;  Laterality: N/A;   COLONOSCOPY WITH PROPOFOL  N/A 11/11/2021   Procedure: COLONOSCOPY WITH PROPOFOL ;  Surgeon: Rivet Claudis PENNER, MD;  Location: [facility];  Service: Endoscopy;  Laterality: N/A;  205   CORONARY ANGIOPLASTY WITH STENT PLACEMENT  08/23/2004   CORONARY ARTERY BYPASS GRAFT  11/24/2004   ESOPHAGOGASTRODUODENOSCOPY  09/19/2001   ESOPHAGOGASTRODUODENOSCOPY N/A  03/11/2016   Procedure: ESOPHAGOGASTRODUODENOSCOPY (EGD);  Surgeon: Claudis PENNER Rivet, MD;  [facility]: [facility];  Service: Endoscopy;  Laterality: N/A;  12:00   ESOPHAGOGASTRODUODENOSCOPY (EGD) WITH PROPOFOL  N/A 11/11/2021   Procedure: ESOPHAGOGASTRODUODENOSCOPY (EGD) WITH PROPOFOL ;  Surgeon: Rivet Claudis PENNER, MD;  [facility]: [facility];  Service: Endoscopy;  Laterality: N/A;   ESOPHAGOGASTRODUODENOSCOPY (EGD) WITH PROPOFOL  N/A 10/25/2023   Procedure: ESOPHAGOGASTRODUODENOSCOPY (EGD) WITH PROPOFOL ;  Surgeon: Eartha Angelia Sieving, MD;  [facility]: [facility];  Service: Gastroenterology;  Laterality: N/A;  10:45am;asa 3   FINGER TENDON REPAIR Bilateral    in middle finger   KNEE SURGERY     left knee   NASAL SEPTUM SURGERY     POLYPECTOMY  11/11/2021   Procedure: POLYPECTOMY;  Surgeon: Rivet Claudis PENNER, MD;  [facility]: [facility];  Service: Endoscopy;;    Family History  Problem Relation Age of Onset   Coronary artery disease Mother        strong family history of   Heart attack Mother        dying of (at age 75)   Diabetes Mother    Diabetes Sister    Diabetes Brother    Pancreatitis Brother    Healthy Daughter    Hyperlipidemia Daughter    Heart disease Brother    Hypertension Brother    Hyperlipidemia Brother     Social History   Socioeconomic History   Marital status: Married    Spouse name: Not on file   Number of children: Not on file   Years of education: Not on file   Highest education level: Not on file  Occupational History   Not on file  Tobacco Use   Smoking status: Never   Smokeless tobacco: Never  Vaping Use   Vaping status: Never Used  Substance and Sexual Activity   Alcohol use: Not Currently   Drug use: No   Sexual activity: Not on file  Other Topics Concern   Not on file  Social History Narrative   The patient lives in [place] with his wife.  He is     disabled secondary to coronary artery disease and pancreatitis.  He does     not  smoke, he does not drink alcohol.  There is no drug use.    Social Drivers of Corporate investment banker Strain: Low Risk  (11/29/2023)   Overall Financial Resource Strain (CARDIA)    Difficulty of Paying Living Expenses: Not hard at all  Food Insecurity: No Food Insecurity (03/26/2024)   Received from [facility]   Hunger Vital Sign    Within the past 12 months, you worried that your food would run out before you got the money to buy more.: Never true    Within the past 12 months, the food you bought just didn't last  and you didn't have money to get more.: Never true  Transportation Needs: No Transportation Needs (03/26/2024)   Received from [facility] - Transportation    Lack of Transportation (Medical): No    Lack of Transportation (Non-Medical): No  Physical Activity: Inactive (11/29/2023)   Exercise Vital Sign    Days of Exercise per Week: 0 days    Minutes of Exercise per Session: 0 min  Stress: No Stress Concern Present (11/29/2023)   [employer] of Occupational Health - Occupational Stress Questionnaire    Feeling of Stress : Only a little  Social Connections: Moderately Integrated (11/29/2023)   Social Connection and Isolation Panel    Frequency of Communication with Friends and Family: More than three times a week    Frequency of Social Gatherings with Friends and Family: More than three times a week    Attends Religious Services: More than 4 times per year    Active Member of [employer] or Organizations: No    Attends Banker Meetings: Never    Marital Status: Married  Catering manager Violence: Not At Risk (03/26/2024)   Received from [facility]   Humiliation, Afraid, Rape, and Kick questionnaire    Within the last year, have you been afraid of your partner or ex-partner?: No    Within the last year, have you been humiliated or emotionally abused in other ways by your partner or ex-partner?: No    Within the last year, have you been kicked,  hit, slapped, or otherwise physically hurt by your partner or ex-partner?: No    Within the last year, have you been raped or forced to have any kind of sexual activity by your partner or ex-partner?: No    Outpatient Medications Prior to Visit  Medication Sig Dispense Refill   Cholecalciferol 125 MCG (5000 UT) TABS Take 10,000 Units by mouth daily.     clopidogrel  (PLAVIX ) 75 MG tablet TAKE 1 TABLET BY MOUTH ONCE DAILY as a blood thinner 180 tablet 2   COMFORT EZ PEN NEEDLES 32G X 4 MM MISC USE AS DIRECTED FOR injecting insulin 100 each 3   Continuous Glucose Receiver (FREESTYLE LIBRE 3 READER) DEVI Use it to check blood glucose as instructed. 1 each 0   Continuous Glucose Sensor (FREESTYLE LIBRE 3 PLUS SENSOR) MISC Change sensor every 15 days. 6 each 3   diazepam  (VALIUM ) 10 MG tablet Take 0.5 tablets (5 mg total) by mouth at bedtime as needed for anxiety or sleep. 30 tablet 0   DULoxetine  (CYMBALTA ) 60 MG capsule TAKE ONE CAPSULE BY MOUTH DAILY 90 capsule 1   ezetimibe  (ZETIA ) 10 MG tablet TAKE 1 TABLET BY MOUTH DAILY 90 tablet 0   hydrochlorothiazide  (HYDRODIURIL ) 25 MG tablet TAKE 1 TABLET BY MOUTH IN THE MORNING 90 tablet 1   HYDROcodone -acetaminophen  (NORCO) 10-325 MG tablet Take 1 tablet by mouth every 6 (six) hours as needed for severe pain (pain score 7-10). 120 tablet 0   icosapent  Ethyl (VASCEPA ) 1 g capsule TAKE TWO CAPSULES BY MOUTH TWICE DAILY 120 capsule 1   insulin isophane & regular human KwikPen (NOVOLIN  70/30 KWIKPEN) (70-30) 100 UNIT/ML KwikPen Inject 60 Units into the skin 2 (two) times daily. 45 mL 5   lansoprazole  (PREVACID ) 30 MG capsule Take 1 capsule (30 mg total) by mouth 2 (two) times daily before a meal. 180 capsule 1   metFORMIN  (GLUCOPHAGE -XR) 500 MG 24 hr tablet TAKE 1 TABLET BY MOUTH TWICE DAILY WITH  A MEAL 180 tablet 1   methocarbamol  (ROBAXIN ) 500 MG tablet Take 1 tablet (500 mg total) by mouth every 8 (eight) hours as needed for muscle spasms. 30 tablet 0    metoprolol  tartrate (LOPRESSOR ) 25 MG tablet TAKE 1 TABLET BY MOUTH TWICE DAILY 180 tablet 1   nitroGLYCERIN  (NITROSTAT ) 0.4 MG SL tablet Place 0.4 mg under the tongue every 5 (five) minutes as needed for chest pain. Reported on 02/26/2016     NONFORMULARY OR COMPOUNDED ITEM Apply 1 application. topically daily as needed (neuropathy). Compounded cream for foot pain. Gets at [facility].     ondansetron  (ZOFRAN ) 4 MG tablet Take 1 tablet (4 mg total) by mouth every 8 (eight) hours as needed for nausea or vomiting. 30 tablet 0   polyethylene glycol (MIRALAX / GLYCOLAX) 17 g packet Take 17 g by mouth daily as needed for moderate constipation.     tadalafil  (CIALIS ) 20 MG tablet Take 1 tablet (20 mg total) by mouth daily as needed. 30 tablet 5   tadalafil  (CIALIS ) 5 MG tablet Take 1 tablet (5 mg total) by mouth daily as needed for erectile dysfunction. 30 tablet 11   gabapentin  (NEURONTIN ) 300 MG capsule TAKE (2) CAPSULES BY MOUTH (3) TIMES DAILY. (Patient taking differently: Take 300 mg by mouth 2 (two) times daily.) 540 capsule 0   insulin NPH-regular Human (NOVOLIN  70/30) (70-30) 100 UNIT/ML injection Inject 60 Units into the skin 2 (two) times daily before a meal. When glucose is above 90 and eating     losartan  (COZAAR ) 50 MG tablet Take 1 tablet (50 mg total) by mouth daily. 30 tablet 4   No facility-administered medications prior to visit.    Allergies  Allergen Reactions   Iodinated Contrast Media Hives, Itching and Nausea Only    Flushing, Burning, itching Other reaction(s): Angioedema (ALLERGY/intolerance)    Iohexol  Hives and Shortness Of Breath     Desc: HIVES,SOB NEEDS PREP.MEDS    Metoclopramide Hcl Hives    Patient became very spastic   Alfuzosin  Hcl Er     Dropped blood pressure   Sulfonamide Derivatives Hives    Large hives   Enalapril  Cough   Penicillins Rash    Has patient had a PCN reaction causing immediate rash, facial/tongue/throat swelling, SOB or lightheadedness  with hypotension: Yes Has patient had a PCN reaction causing severe rash involving mucus membranes or skin necrosis: No Has patient had a PCN reaction that required hospitalization No Has patient had a PCN reaction occurring within the last 10 years: No. If all of the above answers are NO, then may proceed with Cephalosporin use.    Sulfa Antibiotics Rash    Other Reaction: Not Assessed    ROS Review of Systems  Constitutional:  Negative for chills and fever.  HENT:  Positive for congestion and tinnitus.        Hearing difficulty  Eyes:  Negative for pain and discharge.  Respiratory:  Positive for cough. Negative for shortness of breath.   Cardiovascular:  Negative for palpitations.  Gastrointestinal:  Negative for constipation, diarrhea, nausea and vomiting.  Endocrine: Negative for polydipsia and polyuria.  Genitourinary:  Positive for difficulty urinating. Negative for hematuria.  Musculoskeletal:  Positive for arthralgias, back pain and neck pain. Negative for neck stiffness.  Skin:  Negative for rash.  Neurological:  Positive for numbness (B/l LE, intermittent). Negative for dizziness, weakness and headaches.  Psychiatric/Behavioral:  Negative for agitation and behavioral problems.  Objective:    Physical Exam Vitals reviewed.  Constitutional:      General: He is not in acute distress.    Appearance: He is not diaphoretic.  HENT:     Head: Normocephalic and atraumatic.     Nose: Congestion present.     Mouth/Throat:     Mouth: Mucous membranes are moist.  Eyes:     General: No scleral icterus.    Extraocular Movements: Extraocular movements intact.  Cardiovascular:     Rate and Rhythm: Normal rate and regular rhythm.     Heart sounds: Normal heart sounds. No murmur heard. Pulmonary:     Breath sounds: Normal breath sounds. No wheezing or rales.  Musculoskeletal:     Cervical back: Neck supple. No tenderness.     Right lower leg: No edema.     Left lower  leg: No edema.  Skin:    General: Skin is warm.     Findings: No rash.  Neurological:     General: No focal deficit present.     Mental Status: He is alert and oriented to person, place, and time.     Cranial Nerves: No cranial nerve deficit.     Sensory: Sensory deficit (B/l feet) present.     Motor: No weakness.  Psychiatric:        Mood and Affect: Mood normal.        Behavior: Behavior normal.     BP 134/84   Pulse (!) 101   Ht 5' 9.5 (1.765 m)   Wt 170 lb (77.1 kg)   SpO2 98%   BMI 24.74 kg/m  Wt Readings from Last 3 Encounters:  04/09/24 170 lb (77.1 kg)  12/06/23 170 lb 9.6 oz (77.4 kg)  11/29/23 172 lb (78 kg)    Lab Results  Component Value Date   TSH 3.110 12/30/2022   Lab Results  Component Value Date   WBC 6.0 08/12/2023   HGB 18.0 (H) 08/12/2023   HCT 54.1 (H) 08/12/2023   MCV 84.3 08/12/2023   PLT 234 08/12/2023   Lab Results  Component Value Date   NA 140 08/12/2023   K 3.9 08/12/2023   CO2 30 08/12/2023   GLUCOSE 108 08/12/2023   BUN 20 08/12/2023   CREATININE 1.29 08/12/2023   BILITOT 0.6 08/12/2023   ALKPHOS 71 07/04/2023   AST 19 08/12/2023   ALT 15 08/12/2023   PROT 6.7 08/12/2023   ALBUMIN 4.5 07/04/2023   CALCIUM  9.6 08/12/2023   ANIONGAP 13 11/06/2021   EGFR 63 08/12/2023   Lab Results  Component Value Date   CHOL 261 (H) 07/04/2023   Lab Results  Component Value Date   HDL 45 07/04/2023   Lab Results  Component Value Date   LDLCALC 142 (H) 07/04/2023   Lab Results  Component Value Date   TRIG 405 (H) 07/04/2023   Lab Results  Component Value Date   CHOLHDL 5.8 (H) 07/04/2023   Lab Results  Component Value Date   HGBA1C 8.8 (H) 07/04/2023      Assessment & Plan:   Problem List Items Addressed This Visit       Cardiovascular and Mediastinum   Essential hypertension, benign   BP Readings from Last 1 Encounters:  04/09/24 134/84   Usually well-controlled with Metoprolol , Losartan  and HCTZ Counseled  for compliance with the medications Advised DASH diet and moderate exercise/walking, at least 150 mins/week      Relevant Medications   losartan  (COZAAR ) 50 MG  tablet     Endocrine   Diabetic peripheral neuropathy (HCC)   Long-standing, severe b/l LE pain at times On Gabapentin  600 mg TID and Norco PRN On Cymbalta  60 mg QD Valium  for chronic pain and anxiety related to neuropathy Has Gabapentin  cream for neuropathy      Relevant Medications   gabapentin  (NEURONTIN ) 300 MG capsule   losartan  (COZAAR ) 50 MG tablet   Type 2 diabetes mellitus with diabetic neuropathy, with long-term current use of insulin (HCC) - Primary   Lab Results  Component Value Date   HGBA1C 8.8 (H) 07/04/2023   Uncontrolled, check HbA1c On Novolin  70/30 - 60 U QAM and 48 U QHS, and Metformin  500 mg BID, used to f/u with Dr Lenis Needs to take Novolin  regularly - switched to pen form, advised to take 48 Units at evening if blood glucose between 100-150 instead of taking random dose If persistent, will need to switch to Tresiba or Toujeo with ISS Advised to follow diabetic diet -has joined diabetes reversal group Not on any statin due to statin intolerance Diabetic eye exam: Advised to follow up with Ophthalmology for diabetic eye exam      Relevant Medications   losartan  (COZAAR ) 50 MG tablet     Musculoskeletal and Integument   Foot callus   Has a callus on left foot Advised to use callus cap or salicylic acid solution If persistent, will require podiatry evaluation      Onychomycosis   Advised to try Lamisil cream for now Due to his history of pancreatitis, would prefer to avoid oral terbinafine for now If he has persistent fungal infection, will cautiously use oral terbinafine        Genitourinary   Stage 3a chronic kidney disease (HCC)   Last BMP reviewed, stable GFR around 50 Advised to maintain adequate hydration On losartan  Avoid nephrotoxic agents        Other   Cervical spinal  stenosis   F/u with Spine surgery On Gabapentin  and Cymbalta  for chronic neuropathy pain      Mixed hyperlipidemia   Continue Zetia  and Vascepa  Intolerant to statin and Repatha   May try Praluent, discuss with Cardiology      Relevant Medications   losartan  (COZAAR ) 50 MG tablet   Other Relevant Orders   Lipid Profile   Depression, recurrent (HCC)   Well-controlled, on Cymbalta  now, was on Paxil  Since he has uncontrolled peripheral neuropathy, had switched to Cymbalta  60 mg QD      Encounter for general adult medical examination with abnormal findings   Physical exam as documented. Fasting blood tests today. Advised to get Shingrix and Tdap vaccines at local pharmacy.         Meds ordered this encounter  Medications   gabapentin  (NEURONTIN ) 300 MG capsule    Sig: Take 1 capsule (300 mg total) by mouth 3 (three) times daily.    Dispense:  270 capsule    Refill:  1   losartan  (COZAAR ) 50 MG tablet    Sig: Take 1 tablet (50 mg total) by mouth daily.    Dispense:  90 tablet    Refill:  3    Follow-up: Return in about 4 months (around 08/09/2024) for DM.    Suzzane MARLA Blanch, MD

## 2024-04-10 ENCOUNTER — Ambulatory Visit: Payer: Self-pay | Admitting: Internal Medicine

## 2024-04-10 LAB — CBC WITH DIFFERENTIAL/PLATELET
Basophils Absolute: 0.1 x10E3/uL (ref 0.0–0.2)
Basos: 1 %
EOS (ABSOLUTE): 0.3 x10E3/uL (ref 0.0–0.4)
Eos: 3 %
Hematocrit: 54 % — ABNORMAL HIGH (ref 37.5–51.0)
Hemoglobin: 16.2 g/dL (ref 13.0–17.7)
Immature Grans (Abs): 0 x10E3/uL (ref 0.0–0.1)
Immature Granulocytes: 0 %
Lymphocytes Absolute: 2 x10E3/uL (ref 0.7–3.1)
Lymphs: 24 %
MCH: 24 pg — ABNORMAL LOW (ref 26.6–33.0)
MCHC: 30 g/dL — ABNORMAL LOW (ref 31.5–35.7)
MCV: 80 fL (ref 79–97)
Monocytes Absolute: 0.7 x10E3/uL (ref 0.1–0.9)
Monocytes: 8 %
Neutrophils Absolute: 5.2 x10E3/uL (ref 1.4–7.0)
Neutrophils: 64 %
Platelets: 270 x10E3/uL (ref 150–450)
RBC: 6.75 x10E6/uL — ABNORMAL HIGH (ref 4.14–5.80)
RDW: 19.4 % — ABNORMAL HIGH (ref 11.6–15.4)
WBC: 8.2 x10E3/uL (ref 3.4–10.8)

## 2024-04-10 LAB — HEMOGLOBIN A1C
Est. average glucose Bld gHb Est-mCnc: 223 mg/dL
Hgb A1c MFr Bld: 9.4 % — ABNORMAL HIGH (ref 4.8–5.6)

## 2024-04-10 LAB — CMP14+EGFR
ALT: 15 IU/L (ref 0–44)
AST: 23 IU/L (ref 0–40)
Albumin: 4.3 g/dL (ref 3.9–4.9)
Alkaline Phosphatase: 76 IU/L (ref 44–121)
BUN/Creatinine Ratio: 20 (ref 10–24)
BUN: 27 mg/dL (ref 8–27)
Bilirubin Total: 0.7 mg/dL (ref 0.0–1.2)
CO2: 22 mmol/L (ref 20–29)
Calcium: 9.7 mg/dL (ref 8.6–10.2)
Chloride: 93 mmol/L — ABNORMAL LOW (ref 96–106)
Creatinine, Ser: 1.38 mg/dL — ABNORMAL HIGH (ref 0.76–1.27)
Globulin, Total: 2.8 g/dL (ref 1.5–4.5)
Glucose: 271 mg/dL — ABNORMAL HIGH (ref 70–99)
Potassium: 4 mmol/L (ref 3.5–5.2)
Sodium: 132 mmol/L — ABNORMAL LOW (ref 134–144)
Total Protein: 7.1 g/dL (ref 6.0–8.5)
eGFR: 58 mL/min/1.73 — ABNORMAL LOW (ref >59)

## 2024-04-10 LAB — LIPID PANEL
Chol/HDL Ratio: 8.8 ratio — ABNORMAL HIGH (ref 0.0–5.0)
Cholesterol, Total: 299 mg/dL — ABNORMAL HIGH (ref 100–199)
HDL: 34 mg/dL — ABNORMAL LOW (ref >39)
Triglycerides: 904 mg/dL (ref 0–149)

## 2024-04-11 LAB — MICROALBUMIN / CREATININE URINE RATIO
Creatinine, Urine: 240.4 mg/dL
Microalb/Creat Ratio: 122 mg/g{creat} — ABNORMAL HIGH (ref 0–29)
Microalbumin, Urine: 293.4 ug/mL

## 2024-04-20 ENCOUNTER — Ambulatory Visit: Admitting: Cardiology

## 2024-04-26 ENCOUNTER — Encounter: Payer: Self-pay | Admitting: Internal Medicine

## 2024-04-27 ENCOUNTER — Other Ambulatory Visit: Payer: Self-pay | Admitting: Internal Medicine

## 2024-04-27 DIAGNOSIS — E1142 Type 2 diabetes mellitus with diabetic polyneuropathy: Secondary | ICD-10-CM

## 2024-04-27 DIAGNOSIS — M25511 Pain in right shoulder: Secondary | ICD-10-CM

## 2024-04-27 MED ORDER — METHOCARBAMOL 500 MG PO TABS: 500.0000 mg | ORAL_TABLET | Freq: Three times a day (TID) | ORAL | 0 refills | Status: DC | PRN

## 2024-04-27 MED ORDER — HYDROCODONE-ACETAMINOPHEN 10-325 MG PO TABS
1.0000 | ORAL_TABLET | Freq: Four times a day (QID) | ORAL | 0 refills | Status: AC | PRN
Start: 2024-04-27 — End: ?

## 2024-05-04 ENCOUNTER — Encounter (HOSPITAL_COMMUNITY): Payer: Self-pay

## 2024-05-04 ENCOUNTER — Ambulatory Visit: Payer: Self-pay

## 2024-05-04 ENCOUNTER — Other Ambulatory Visit: Payer: Self-pay

## 2024-05-04 ENCOUNTER — Emergency Department (HOSPITAL_COMMUNITY)
Admission: EM | Admit: 2024-05-04 | Discharge: 2024-05-04 | Disposition: A | Discharge status: Home / Self Care | Attending: Emergency Medicine | Admitting: Emergency Medicine

## 2024-05-04 ENCOUNTER — Encounter: Payer: Self-pay | Admitting: Internal Medicine

## 2024-05-04 ENCOUNTER — Telehealth: Admitting: Internal Medicine

## 2024-05-04 DIAGNOSIS — K611 Rectal abscess: Secondary | ICD-10-CM | POA: Diagnosis not present

## 2024-05-04 DIAGNOSIS — R7309 Other abnormal glucose: Secondary | ICD-10-CM | POA: Diagnosis not present

## 2024-05-04 DIAGNOSIS — L0231 Cutaneous abscess of buttock: Secondary | ICD-10-CM | POA: Diagnosis not present

## 2024-05-04 LAB — CBG MONITORING, ED
Glucose-Capillary: 226 mg/dL — ABNORMAL HIGH (ref 70–99)
Glucose-Capillary: 173 mg/dL — ABNORMAL HIGH (ref 70–99)

## 2024-05-04 MED ORDER — LIDOCAINE HCL (PF) 1 % IJ SOLN
10.0000 mL | Freq: Once | INTRAMUSCULAR | Status: AC
  Administered 2024-05-04: 10 mL
  Filled 2024-05-04: qty 10

## 2024-05-04 MED ORDER — HYDROMORPHONE HCL 1 MG/ML IJ SOLN
1.0000 mg | Freq: Once | INTRAMUSCULAR | Status: AC
  Administered 2024-05-04: 1 mg via INTRAMUSCULAR
  Filled 2024-05-04: qty 1

## 2024-05-04 MED ORDER — ONDANSETRON 4 MG PO TBDP
4.0000 mg | ORAL_TABLET | Freq: Once | ORAL | Status: AC
  Administered 2024-05-04: 4 mg via ORAL
  Filled 2024-05-04: qty 1

## 2024-05-04 MED ORDER — DOXYCYCLINE HYCLATE 100 MG PO CAPS: 100.0000 mg | ORAL_CAPSULE | Freq: Two times a day (BID) | ORAL | 0 refills | Status: DC

## 2024-05-04 MED ORDER — DOXYCYCLINE HYCLATE 100 MG PO TABS
100.0000 mg | ORAL_TABLET | Freq: Once | ORAL | Status: AC
  Administered 2024-05-04: 100 mg via ORAL
  Filled 2024-05-04: qty 1

## 2024-05-04 MED ORDER — POVIDONE-IODINE 10 % EX SOLN: CUTANEOUS | Status: DC | PRN

## 2024-05-04 MED ORDER — HYDROCODONE-ACETAMINOPHEN 5-325 MG PO TABS
1.0000 | ORAL_TABLET | Freq: Once | ORAL | Status: DC
  Filled 2024-05-04: qty 1

## 2024-05-04 NOTE — Telephone Encounter (Signed)
 Pt added as a video appt to discuss problem with pcp.

## 2024-05-04 NOTE — ED Triage Notes (Signed)
 Pt comes in for an abscess on left butt cheek. Pt called his PCP and told pt to come in. Pt states the abscess is hard, denies drainage. Pt is unable to sit on his left side. A&Ox4.   Pt tried to drain it at home but only blood came out.

## 2024-05-04 NOTE — Discharge Instructions (Signed)
 The entire course of antibiotics.  I would also like you to sit in a warm tub of water  for 10 minutes 3 times daily or use your hand-held shower for warm water  massage to the site to keep it open and draining.  You may take your hydrocodone  as needed for pain relief.  Plan a recheck if not significantly improving, you should start to see significant improving in the swelling and tenderness within the next 2 to 3 days with antibiotics.  If you have significantly worsening pain or swelling or if you develop fever or any new symptoms I recommend returning here over the weekend for recheck.

## 2024-05-04 NOTE — Telephone Encounter (Signed)
 Patient seen today

## 2024-05-04 NOTE — Progress Notes (Signed)
 Virtual Visit via Video Note   Because of [name]'s co-morbid illnesses, he is at least at moderate risk for complications without adequate follow up.  This format is felt to be most appropriate for this patient at this time.  All issues noted in this document were discussed and addressed.  A limited physical exam was performed with this format.      Evaluation Performed:  Follow-up visit  Date:  05/04/2024   ID:  [name], [name] 06-Mar-1961, MRN [id number]  Patient Location: Home Provider Location: Office/Clinic  Participants: Patient Location of Patient: Home Location of Provider: Telehealth Consent was obtain for visit to be over via telehealth. I verified that I am speaking with the correct person using two identifiers.  PCP:  [name], MD   Chief Complaint: Rectal mass  History of Present Illness:    [name] is a 63 y.o. male who is a video visit for urgent concern for left-sided rectal mass for the last 3 days.  He reports that the mass is oval-shaped, 3 cm X 2 cm, extending from the left buttock to the anal area. He reports severe pain and redness in the area.  Denies any recent fever or chills.  His wife tried to take a look at it, and inserted a needle to try to drain, but was not able to drain it.  He denies any local drainage currently.  The patient does not have symptoms concerning for COVID-19 infection (fever, chills, cough, or new shortness of breath).   Past Medical, Surgical, Social History, Allergies, and Medications have been Reviewed.  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  Patent coronary bypass grafts with a free radial graft to PDA and LIMA to the  diag.   Diabetes mellitus    Diabetic peripheral neuropathy (HCC)  05/06/2020   Dyslipidemia    Poorly controlled, probably related to his DM (Some of this is secondary to his inability to afford medications).   ED (erectile dysfunction)    Esophageal yeast infection (HCC)    Gastroparesis    Hypertension    Metaplasia of esophagus    Pancreatitis chronic    Pseudocyst   Polycythemia vera(238.4) 04/26/2012   PONV (postoperative nausea and vomiting)    Past Surgical History:  Procedure Laterality Date   APPENDECTOMY     1980   BIOPSY  11/11/2021   Procedure: BIOPSY;  Surgeon: Golda Claudis PENNER, MD;  [facility];  Service: Endoscopy;;   CARDIAC CATHETERIZATION     CHOLECYSTECTOMY     COLONOSCOPY  07/31/1985   COLONOSCOPY N/A 03/11/2016   Procedure: COLONOSCOPY;  Surgeon: Claudis PENNER Golda, MD;  [facility]: [facility];  Service: Endoscopy;  Laterality: N/A;   COLONOSCOPY WITH PROPOFOL  N/A 11/11/2021   Procedure: COLONOSCOPY WITH PROPOFOL ;  Surgeon: Golda Claudis PENNER, MD;  [facility]: [facility];  Service: Endoscopy;  Laterality: N/A;  205   CORONARY ANGIOPLASTY WITH STENT PLACEMENT  08/23/2004   CORONARY ARTERY BYPASS GRAFT  11/24/2004   ESOPHAGOGASTRODUODENOSCOPY  09/19/2001   ESOPHAGOGASTRODUODENOSCOPY N/A 03/11/2016   Procedure: ESOPHAGOGASTRODUODENOSCOPY (EGD);  Surgeon: Claudis PENNER Golda, MD;  [facility];  Service: Endoscopy;  Laterality: N/A;  12:00   ESOPHAGOGASTRODUODENOSCOPY (EGD) WITH PROPOFOL  N/A 11/11/2021   Procedure: ESOPHAGOGASTRODUODENOSCOPY (EGD) WITH PROPOFOL ;  Surgeon: Golda Claudis PENNER, MD;  [facility];  Service: Endoscopy;  Laterality: N/A;   ESOPHAGOGASTRODUODENOSCOPY (EGD) WITH PROPOFOL  N/A 10/25/2023   Procedure: ESOPHAGOGASTRODUODENOSCOPY (EGD) WITH PROPOFOL ;  Surgeon: Eartha Angelia Sieving, MD;  [facility]: [facility];  Service: Gastroenterology;  Laterality: N/A;  10:45am;asa 3   FINGER TENDON REPAIR Bilateral    in middle finger   KNEE SURGERY     left knee   NASAL SEPTUM SURGERY      POLYPECTOMY  11/11/2021   Procedure: POLYPECTOMY;  Surgeon: Golda Claudis PENNER, MD;  [facility]: [facility];  Service: Endoscopy;;     No outpatient medications have been marked as taking for the 05/04/24 encounter (Appointment) with [name], MD.     Allergies:   Iodinated contrast media, Iohexol , Metoclopramide hcl, Alfuzosin  hcl er, Sulfonamide derivatives, Enalapril , Penicillins, and Sulfa antibiotics   ROS:   Please see the history of present illness. All other systems reviewed and are negative.   Labs/Other Tests and Data Reviewed:    Recent Labs: 04/09/2024: ALT 15; BUN 27; Creatinine, Ser 1.38; Hemoglobin 16.2; Platelets 270; Potassium 4.0; Sodium 132   Recent Lipid Panel Lab Results  Component Value Date/Time   CHOL 299 (H) 04/09/2024 04:53 PM   TRIG 904 (HH) 04/09/2024 04:53 PM   HDL 34 (L) 04/09/2024 04:53 PM   CHOLHDL 8.8 (H) 04/09/2024 04:53 PM   CHOLHDL NOT REPORTED DUE TO HIGH TRIGLYCERIDES 09/29/2021 03:31 PM   LDLCALC Comment (A) 04/09/2024 04:53 PM   LDLDIRECT 110 08/19/2016 12:59 PM    Wt Readings from Last 3 Encounters:  04/09/24 170 lb (77.1 kg)  12/06/23 170 lb 9.6 oz (77.4 kg)  11/29/23 172 lb (78 kg)     Objective:    Vital Signs:  There were no vitals taken for this visit.   VITAL SIGNS:  reviewed GEN:  no acute distress EYES:  sclerae anicteric, EOMI - Extraocular Movements Intact RESPIRATORY:  normal respiratory effort, symmetric expansion NEURO:  alert and oriented x 3, no obvious focal deficit PSYCH:  normal affect  ASSESSMENT & PLAN:    Peri-rectal abscess Considering his symptoms, concern for perirectal abscess Advised to go to ER for further evaluation, may need I&D and antibiotics Advised to perform sitz bath after ER evaluation    I discussed the assessment and treatment plan with the patient. The patient was provided an opportunity to ask questions, and all were answered. The patient agreed with the plan and  demonstrated an understanding of the instructions.   The patient was advised to call back or seek an in-person evaluation if the symptoms worsen or if the condition fails to improve as anticipated.  The above assessment and management plan was discussed with the patient. The patient verbalized understanding of and has agreed to the management plan.   Medication Adjustments/Labs and Tests Ordered: Current medicines are reviewed at length with the patient today.  Concerns regarding medicines are outlined above.   Tests Ordered: No orders of the defined types were placed in this encounter.   Medication Changes: No orders of the defined types were placed in this encounter.    Note: This dictation was prepared with Dragon dictation along with smaller phrase technology. Similar sounding words can be transcribed inadequately or may not be corrected upon review. Any transcriptional errors that result from this process are unintentional.  Disposition:  Follow up  Signed, Suzzane MARLA Blanch, MD  05/04/2024 10:43 AM     [facility]

## 2024-05-04 NOTE — Assessment & Plan Note (Signed)
 Considering his symptoms, concern for perirectal abscess Advised to go to ER for further evaluation, may need I&D and antibiotics Advised to perform sitz bath after ER evaluation

## 2024-05-04 NOTE — Telephone Encounter (Signed)
 FYI Only or Action Required?: Action required by provider: clinical question for provider.  Patient was last seen in primary care on 05/04/2024 by [name], MD.  Called Nurse Triage reporting Appointment and Abscess.  Symptoms began several days ago.  Interventions attempted: Nothing.  Symptoms are: unchanged.  Triage Disposition: Home Care, Call Transferred to PCP Now  Patient/caregiver understands and will follow disposition?: No, wishes to speak with PCP  Patient upset and verbally aggressive, refusing NT but did provide some context to his chief complaint. Pt repeats concern for some form cancer and is adamant about being worked in today and seen by PCP today. Previous NT routed call to office, will send this note as an update. Pt disconnected call when presented with appt time for other providers in office.  This RN attempted call back to no avail, left vm.   Reason for Disposition  [1] Small swelling or lump AND [2] unexplained AND [3] present < 1 week  Answer Assessment - Initial Assessment Questions 1. APPEARANCE of SWELLING: What does it look like?     Looks like a knot under the skin  2. SIZE: How large is the swelling? (e.g., inches, cm; or compare to size of pinhead, tip of pen, eraser, coin, pea, grape, ping pong ball)      Size of index finger  3. LOCATION: Where is the swelling located?     Buttocks  4. ONSET: When did the swelling start?     2-3 days ago  5. COLOR: What color is it? Is there more than one color?     Redness  6. PAIN: Is there any pain? If Yes, ask: How bad is the pain? (Scale 1-10; or mild, moderate, severe)       Moderate pain  7. ITCH: Does it itch? If Yes, ask: How bad is the itch?      No itching  8. CAUSE: What do you think caused the swelling?     Was helping son do some vinvy flooring and spun on the floor and pinched the skin  9 OTHER SYMPTOMS: Do you have any other symptoms? (e.g., fever)      No  Protocols used: Skin Lump or Localized Swelling-A-AH

## 2024-05-04 NOTE — Patient Instructions (Signed)
 Please go to ER for evaluation of a possible perirectal abscess.

## 2024-05-04 NOTE — Telephone Encounter (Signed)
 FYI Only or Action Required?: FYI only for provider.  Patient was last seen in primary care on 04/09/2024 by [name], MD.  Called Nurse Triage reporting No chief complaint on file..  Symptoms began today.  Interventions attempted: Nothing.  Symptoms are: unchanged.  Triage Disposition: No disposition on file.  Patient/caregiver understands and will follow disposition?:   Declined to have triage completed; states wants to talk to office staff because he wants to be seen today.  Transferred to office.  Copied from CRM #[id number]. Topic: Clinical - Red Word Triage >> May 04, 2024 10:10 AM Donee H wrote: [facility] that prompted transfer to Nurse Triage: Patient is stating has an urgent situation going on but refuses to go in detail with me. He was requesting to speak with pcp to come in for an appointment today.

## 2024-05-06 NOTE — ED Provider Notes (Signed)
 [place] EMERGENCY DEPARTMENT AT [facility] Provider Note   CSN: [id number] Arrival date & time: 05/04/24  1126     Patient presents with: Abscess   [name] is a 63 y.o. male.   The history is provided by the patient.  Abscess Location:  Pelvis Pelvic abscess location:  L buttock Size:  4 cm Abscess quality: induration, painful and redness   Abscess quality: not draining and no fluctuance   Progression:  Worsening Pain details:    Quality:  Sharp and aching   Severity:  Moderate   Timing:  Constant   Progression:  Worsening Chronicity:  New Context: diabetes   Relieved by:  Nothing Worsened by:  Draining/squeezing Ineffective treatments:  Warm compresses Associated symptoms: no fever, no nausea and no vomiting        Prior to Admission medications   Medication Sig Start Date End Date Taking? Authorizing Provider  doxycycline  (VIBRAMYCIN ) 100 MG capsule Take 1 capsule (100 mg total) by mouth 2 (two) times daily. 05/04/24  Yes Delmar Dondero, Mliss, PA-C  Cholecalciferol  125 MCG (5000 UT) TABS Take 10,000 Units by mouth daily.    [provider]  clopidogrel  (PLAVIX ) 75 MG tablet TAKE 1 TABLET BY MOUTH ONCE DAILY as a blood thinner 09/19/23   Tobie, Suzzane POUR, MD  COMFORT EZ PEN NEEDLES 32G X 4 MM MISC USE AS DIRECTED FOR injecting insulin  01/17/24   [name], MD  Continuous Glucose Receiver (FREESTYLE LIBRE 3 READER) DEVI Use it to check blood glucose as instructed. 11/30/23   [name], MD  Continuous Glucose Sensor (FREESTYLE LIBRE 3 PLUS SENSOR) MISC Change sensor every 15 days. 11/30/23   [name], MD  diazepam  (VALIUM ) 10 MG tablet Take 0.5 tablets (5 mg total) by mouth at bedtime as needed for anxiety or sleep. 01/29/24   [name], MD  DULoxetine  (CYMBALTA ) 60 MG capsule TAKE ONE CAPSULE BY MOUTH DAILY 11/21/23   [name], MD  ezetimibe  (ZETIA ) 10 MG tablet TAKE 1 TABLET BY MOUTH DAILY 12/22/23   [name], MD   gabapentin  (NEURONTIN ) 300 MG capsule Take 1 capsule (300 mg total) by mouth 3 (three) times daily. 04/09/24   [name], MD  hydrochlorothiazide  (HYDRODIURIL ) 25 MG tablet TAKE 1 TABLET BY MOUTH IN THE MORNING 02/06/24   [name], MD  HYDROcodone -acetaminophen  (NORCO) 10-325 MG tablet Take 1 tablet by mouth every 6 (six) hours as needed for severe pain (pain score 7-10). 04/27/24   [name], MD  icosapent  Ethyl (VASCEPA ) 1 g capsule TAKE TWO CAPSULES BY MOUTH TWICE DAILY 03/05/24   Lavona Agent, MD  insulin  isophane & regular human KwikPen (NOVOLIN  70/30 KWIKPEN) (70-30) 100 UNIT/ML KwikPen Inject 60 Units into the skin 2 (two) times daily. 12/06/23   [name], MD  lansoprazole  (PREVACID ) 30 MG capsule Take 1 capsule (30 mg total) by mouth 2 (two) times daily before a meal. 08/12/23   Rudy Josette RAMAN, PA-C  losartan  (COZAAR ) 50 MG tablet Take 1 tablet (50 mg total) by mouth daily. 04/09/24   [name], MD  metFORMIN  (GLUCOPHAGE -XR) 500 MG 24 hr tablet TAKE 1 TABLET BY MOUTH TWICE DAILY WITH A MEAL 12/05/23   [name], MD  methocarbamol  (ROBAXIN ) 500 MG tablet Take 1 tablet (500 mg total) by mouth every 8 (eight) hours as needed for muscle spasms. 04/27/24   [name], MD  metoprolol  tartrate (LOPRESSOR ) 25  MG tablet TAKE 1 TABLET BY MOUTH TWICE DAILY 03/12/24   Patel, Rutwik K, MD  nitroGLYCERIN  (NITROSTAT ) 0.4 MG SL tablet Place 0.4 mg under the tongue every 5 (five) minutes as needed for chest pain. Reported on 02/26/2016    [provider]  NONFORMULARY OR COMPOUNDED ITEM Apply 1 application. topically daily as needed (neuropathy). Compounded cream for foot pain. Gets at [facility].    [provider]  ondansetron  (ZOFRAN ) 4 MG tablet Take 1 tablet (4 mg total) by mouth every 8 (eight) hours as needed for nausea or vomiting. 01/20/23   [name], MD  polyethylene glycol (MIRALAX  / GLYCOLAX ) 17 g packet Take 17 g by mouth daily as  needed for moderate constipation.    [provider]  tadalafil  (CIALIS ) 20 MG tablet Take 1 tablet (20 mg total) by mouth daily as needed. 09/21/23   McKenzie, Belvie CROME, MD  tadalafil  (CIALIS ) 5 MG tablet Take 1 tablet (5 mg total) by mouth daily as needed for erectile dysfunction. 09/21/23   McKenzie, Belvie CROME, MD    Allergies: Iodinated contrast media, Iohexol , Metoclopramide hcl, Alfuzosin  hcl er, Sulfonamide derivatives, Enalapril , Penicillins, and Sulfa antibiotics    Review of Systems  Constitutional:  Negative for chills and fever.  Gastrointestinal:  Negative for nausea and vomiting.  Skin:  Positive for color change.  All other systems reviewed and are negative.   Updated Vital Signs BP (!) 142/75   Pulse 82   Temp 98 F (36.7 C)   Resp 16   Ht 5' 9.5 (1.765 m)   Wt 78.9 kg   SpO2 96%   BMI 25.33 kg/m   Physical Exam Vitals and nursing note reviewed. Exam conducted with a chaperone present (RN at bedside).  Constitutional:      Appearance: He is well-developed.  HENT:     Head: Normocephalic and atraumatic.  Cardiovascular:     Rate and Rhythm: Normal rate.  Pulmonary:     Effort: Pulmonary effort is normal.  Genitourinary:    Rectum: No tenderness.     Comments: No rectal tenderness or suggestion of abscess involvement with rectum.  Musculoskeletal:        General: Normal range of motion.     Cervical back: Normal range of motion.  Skin:    General: Skin is warm and dry.     Findings: Abscess present.     Comments: Abscess left buttock near midline perirectal level but not communicating with anus.    Neurological:     Mental Status: He is alert.     (all labs ordered are listed, but only abnormal results are displayed) Labs Reviewed  CBG MONITORING, ED - Abnormal; Notable for the following components:      Result Value   Glucose-Capillary 226 (*)    All other components within normal limits  CBG MONITORING, ED - Abnormal; Notable for the  following components:   Glucose-Capillary 173 (*)    All other components within normal limits    EKG: None  Radiology: No results found.   Procedures    INCISION AND DRAINAGE Performed by: Mliss Narrow Consent: Verbal consent obtained. Risks and benefits: risks, benefits and alternatives were discussed Type: abscess  Body area: left buttock  Anesthesia: local infiltration  Incision was made with a scalpel.  Local anesthetic: lidocaine  1% without epinephrine  Anesthetic total: 6 ml  Complexity: complex Blunt dissection to break up loculations  Drainage: purulent  Drainage amount:  small amount  of purulence mixed with blood  Packing material:   Patient tolerance: Patient tolerated the procedure well with no immediate complications.    Medications Ordered in the ED  lidocaine  (PF) (XYLOCAINE ) 1 % injection 10 mL (10 mLs Other Given 05/04/24 1427)  HYDROmorphone  (DILAUDID ) injection 1 mg (1 mg Intramuscular Given 05/04/24 1452)  ondansetron  (ZOFRAN -ODT) disintegrating tablet 4 mg (4 mg Oral Given 05/04/24 1451)  HYDROmorphone  (DILAUDID ) injection 1 mg (1 mg Intramuscular Given 05/04/24 1612)  doxycycline  (VIBRA -TABS) tablet 100 mg (100 mg Oral Given 05/04/24 1732)                                    Medical Decision Making Patient presenting with several day history of abscess to his left perirectal area with no internal rectal wall involvement.  The site was I&D, patient tolerated procedure well, there was a small amount of purulence, mixed with moderate amount of blood.  The abscess was copiously flushed after probing and deloculated.  He was started on doxycycline , we discussed home treatment including continued warm soaks/shower massage treatments to the site twice daily.  Recheck by PCP or by returning here if if he has any worsening symptoms beyond 2 days of antibiotic use.  He was prescribed a 10-day course of doxycycline .  Amount and/or Complexity of Data  Reviewed Labs: ordered.    Details: CBG 173  Risk Prescription drug management.        Final diagnoses:  Abscess of buttock, left    ED Discharge Orders          Ordered    doxycycline  (VIBRAMYCIN ) 100 MG capsule  2 times daily        05/04/24 1715               Dmitri Pettigrew, PA-C 05/06/24 2313    Towana Ozell BROCKS, MD 05/07/24 [phone]

## 2024-05-07 ENCOUNTER — Encounter: Payer: Self-pay | Admitting: Internal Medicine

## 2024-05-07 ENCOUNTER — Other Ambulatory Visit: Payer: Self-pay | Admitting: Internal Medicine

## 2024-05-07 DIAGNOSIS — K611 Rectal abscess: Secondary | ICD-10-CM

## 2024-05-08 ENCOUNTER — Inpatient Hospital Stay (HOSPITAL_COMMUNITY)
Admission: EM | Admit: 2024-05-08 | Discharge: 2024-05-10 | DRG: 638 | Disposition: A | Discharge status: Home / Self Care | Source: Ambulatory Visit | Attending: Hospitalist | Admitting: Hospitalist

## 2024-05-08 ENCOUNTER — Emergency Department (HOSPITAL_COMMUNITY)

## 2024-05-08 ENCOUNTER — Encounter (HOSPITAL_COMMUNITY): Payer: Self-pay | Admitting: Emergency Medicine

## 2024-05-08 ENCOUNTER — Other Ambulatory Visit: Payer: Self-pay

## 2024-05-08 DIAGNOSIS — L03317 Cellulitis of buttock: Secondary | ICD-10-CM | POA: Diagnosis not present

## 2024-05-08 DIAGNOSIS — E1165 Type 2 diabetes mellitus with hyperglycemia: Secondary | ICD-10-CM | POA: Diagnosis present

## 2024-05-08 DIAGNOSIS — K861 Other chronic pancreatitis: Secondary | ICD-10-CM | POA: Diagnosis present

## 2024-05-08 DIAGNOSIS — Z7902 Long term (current) use of antithrombotics/antiplatelets: Secondary | ICD-10-CM

## 2024-05-08 DIAGNOSIS — E11628 Type 2 diabetes mellitus with other skin complications: Secondary | ICD-10-CM | POA: Diagnosis not present

## 2024-05-08 DIAGNOSIS — Z79899 Other long term (current) drug therapy: Secondary | ICD-10-CM

## 2024-05-08 DIAGNOSIS — L0390 Cellulitis, unspecified: Secondary | ICD-10-CM | POA: Diagnosis not present

## 2024-05-08 DIAGNOSIS — I10 Essential (primary) hypertension: Secondary | ICD-10-CM | POA: Diagnosis present

## 2024-05-08 DIAGNOSIS — E1142 Type 2 diabetes mellitus with diabetic polyneuropathy: Secondary | ICD-10-CM | POA: Diagnosis present

## 2024-05-08 DIAGNOSIS — Z833 Family history of diabetes mellitus: Secondary | ICD-10-CM

## 2024-05-08 DIAGNOSIS — Z7984 Long term (current) use of oral hypoglycemic drugs: Secondary | ICD-10-CM

## 2024-05-08 DIAGNOSIS — Z882 Allergy status to sulfonamides status: Secondary | ICD-10-CM

## 2024-05-08 DIAGNOSIS — K5730 Diverticulosis of large intestine without perforation or abscess without bleeding: Secondary | ICD-10-CM | POA: Diagnosis not present

## 2024-05-08 DIAGNOSIS — Z880 Allergy status to penicillin: Secondary | ICD-10-CM

## 2024-05-08 DIAGNOSIS — Z83438 Family history of other disorder of lipoprotein metabolism and other lipidemia: Secondary | ICD-10-CM

## 2024-05-08 DIAGNOSIS — Z951 Presence of aortocoronary bypass graft: Secondary | ICD-10-CM

## 2024-05-08 DIAGNOSIS — Z91041 Radiographic dye allergy status: Secondary | ICD-10-CM

## 2024-05-08 DIAGNOSIS — K3184 Gastroparesis: Secondary | ICD-10-CM | POA: Diagnosis present

## 2024-05-08 DIAGNOSIS — D45 Polycythemia vera: Secondary | ICD-10-CM | POA: Diagnosis present

## 2024-05-08 DIAGNOSIS — E1143 Type 2 diabetes mellitus with diabetic autonomic (poly)neuropathy: Secondary | ICD-10-CM | POA: Diagnosis present

## 2024-05-08 DIAGNOSIS — E785 Hyperlipidemia, unspecified: Secondary | ICD-10-CM | POA: Diagnosis present

## 2024-05-08 DIAGNOSIS — Z888 Allergy status to other drugs, medicaments and biological substances status: Secondary | ICD-10-CM

## 2024-05-08 DIAGNOSIS — Z955 Presence of coronary angioplasty implant and graft: Secondary | ICD-10-CM

## 2024-05-08 DIAGNOSIS — I2510 Atherosclerotic heart disease of native coronary artery without angina pectoris: Secondary | ICD-10-CM | POA: Diagnosis present

## 2024-05-08 DIAGNOSIS — L0231 Cutaneous abscess of buttock: Secondary | ICD-10-CM | POA: Diagnosis not present

## 2024-05-08 DIAGNOSIS — Z8249 Family history of ischemic heart disease and other diseases of the circulatory system: Secondary | ICD-10-CM

## 2024-05-08 LAB — CBC WITH DIFFERENTIAL/PLATELET
Abs Immature Granulocytes: 0.02 K/uL (ref 0.00–0.07)
Basophils Absolute: 0 K/uL (ref 0.0–0.1)
Basophils Relative: 1 %
Eosinophils Absolute: 0.4 K/uL (ref 0.0–0.5)
Eosinophils Relative: 8 %
HCT: 44.5 % (ref 39.0–52.0)
Hemoglobin: 13.4 g/dL (ref 13.0–17.0)
Immature Granulocytes: 0 %
Lymphocytes Relative: 23 %
Lymphs Abs: 1 K/uL (ref 0.7–4.0)
MCH: 23.9 pg — ABNORMAL LOW (ref 26.0–34.0)
MCHC: 30.1 g/dL (ref 30.0–36.0)
MCV: 79.3 fL — ABNORMAL LOW (ref 80.0–100.0)
Monocytes Absolute: 0.4 K/uL (ref 0.1–1.0)
Monocytes Relative: 8 %
Neutro Abs: 2.7 K/uL (ref 1.7–7.7)
Neutrophils Relative %: 60 %
Platelets: 215 K/uL (ref 150–400)
RBC: 5.61 MIL/uL (ref 4.22–5.81)
RDW: 18.7 % — ABNORMAL HIGH (ref 11.5–15.5)
WBC: 4.5 K/uL (ref 4.0–10.5)
nRBC: 0 % (ref 0.0–0.2)

## 2024-05-08 LAB — COMPREHENSIVE METABOLIC PANEL WITH GFR
ALT: 10 U/L (ref 0–44)
AST: 16 U/L (ref 15–41)
Albumin: 2.8 g/dL — ABNORMAL LOW (ref 3.5–5.0)
Alkaline Phosphatase: 58 U/L (ref 38–126)
Anion gap: 12 (ref 5–15)
BUN: 24 mg/dL — ABNORMAL HIGH (ref 8–23)
CO2: 25 mmol/L (ref 22–32)
Calcium: 9.2 mg/dL (ref 8.9–10.3)
Chloride: 95 mmol/L — ABNORMAL LOW (ref 98–111)
Creatinine, Ser: 1.24 mg/dL (ref 0.61–1.24)
GFR, Estimated: >60 mL/min (ref >60)
Glucose, Bld: 239 mg/dL — ABNORMAL HIGH (ref 70–99)
Potassium: 4.1 mmol/L (ref 3.5–5.1)
Sodium: 132 mmol/L — ABNORMAL LOW (ref 135–145)
Total Bilirubin: 0.8 mg/dL (ref 0.0–1.2)
Total Protein: 6.3 g/dL — ABNORMAL LOW (ref 6.5–8.1)

## 2024-05-08 LAB — LACTIC ACID, PLASMA
Lactic Acid, Venous: 1.6 mmol/L (ref 0.5–1.9)
Lactic Acid, Venous: 1.7 mmol/L (ref 0.5–1.9)

## 2024-05-08 MED ORDER — DULOXETINE HCL 60 MG PO CPEP
60.0000 mg | ORAL_CAPSULE | Freq: Every day | ORAL | Status: DC
  Administered 2024-05-09 – 2024-05-10 (×2): 60 mg via ORAL
  Filled 2024-05-08: qty 2
  Filled 2024-05-08: qty 1

## 2024-05-08 MED ORDER — LOSARTAN POTASSIUM 25 MG PO TABS
25.0000 mg | ORAL_TABLET | Freq: Every day | ORAL | Status: DC
  Administered 2024-05-09 (×2): 25 mg via ORAL
  Filled 2024-05-08 (×2): qty 1

## 2024-05-08 MED ORDER — VITAMIN D 25 MCG (1000 UNIT) PO TABS
10000.0000 [IU] | ORAL_TABLET | Freq: Every day | ORAL | Status: DC
  Filled 2024-05-08: qty 10

## 2024-05-08 MED ORDER — PANTOPRAZOLE SODIUM 40 MG PO TBEC
40.0000 mg | DELAYED_RELEASE_TABLET | Freq: Every day | ORAL | Status: DC
Start: 2024-05-09 — End: 2024-05-10
  Administered 2024-05-09 – 2024-05-10 (×2): 40 mg via ORAL
  Filled 2024-05-08 (×2): qty 1

## 2024-05-08 MED ORDER — GABAPENTIN 300 MG PO CAPS
300.0000 mg | ORAL_CAPSULE | Freq: Every day | ORAL | Status: DC
  Administered 2024-05-09 (×2): 300 mg via ORAL
  Filled 2024-05-08 (×2): qty 1

## 2024-05-08 MED ORDER — POLYETHYLENE GLYCOL 3350 17 G PO PACK: 17.0000 g | PACK | Freq: Every day | ORAL | Status: DC | PRN

## 2024-05-08 MED ORDER — INSULIN ASPART PROT & ASPART (70-30 MIX) 100 UNIT/ML ~~LOC~~ SUSP
40.0000 [IU] | Freq: Two times a day (BID) | SUBCUTANEOUS | Status: DC
  Filled 2024-05-08: qty 10

## 2024-05-08 MED ORDER — HYDROCODONE-ACETAMINOPHEN 10-325 MG PO TABS
1.0000 | ORAL_TABLET | Freq: Four times a day (QID) | ORAL | Status: DC | PRN
Start: 2024-05-08 — End: 2024-05-10
  Administered 2024-05-09 – 2024-05-10 (×4): 1 via ORAL
  Filled 2024-05-08 (×4): qty 1

## 2024-05-08 MED ORDER — SODIUM CHLORIDE 0.9 % IV SOLN
2.0000 g | Freq: Three times a day (TID) | INTRAVENOUS | Status: DC
  Administered 2024-05-09 – 2024-05-10 (×4): 2 g via INTRAVENOUS
  Filled 2024-05-08 (×8): qty 10

## 2024-05-08 MED ORDER — ICOSAPENT ETHYL 1 G PO CAPS
2.0000 g | ORAL_CAPSULE | Freq: Two times a day (BID) | ORAL | Status: DC
  Administered 2024-05-09 – 2024-05-10 (×4): 2 g via ORAL
  Filled 2024-05-08 (×7): qty 2

## 2024-05-08 MED ORDER — MORPHINE SULFATE (PF) 2 MG/ML IV SOLN
2.0000 mg | INTRAVENOUS | Status: DC | PRN
  Administered 2024-05-09 – 2024-05-10 (×4): 2 mg via INTRAVENOUS
  Filled 2024-05-08 (×5): qty 1

## 2024-05-08 MED ORDER — ACETAMINOPHEN 650 MG RE SUPP: 650.0000 mg | Freq: Four times a day (QID) | RECTAL | Status: DC | PRN

## 2024-05-08 MED ORDER — METRONIDAZOLE 500 MG/100ML IV SOLN
500.0000 mg | Freq: Once | INTRAVENOUS | Status: AC
  Administered 2024-05-08: 500 mg via INTRAVENOUS
  Filled 2024-05-08: qty 100

## 2024-05-08 MED ORDER — HYDROCHLOROTHIAZIDE 25 MG PO TABS
25.0000 mg | ORAL_TABLET | Freq: Every morning | ORAL | Status: DC
  Administered 2024-05-09 – 2024-05-10 (×2): 25 mg via ORAL
  Filled 2024-05-08 (×2): qty 1

## 2024-05-08 MED ORDER — ACETAMINOPHEN 325 MG PO TABS: 650.0000 mg | ORAL_TABLET | Freq: Four times a day (QID) | ORAL | Status: DC | PRN

## 2024-05-08 MED ORDER — SODIUM CHLORIDE 0.9 % IV SOLN
2.0000 g | Freq: Once | INTRAVENOUS | Status: AC
  Administered 2024-05-08: 2 g via INTRAVENOUS
  Filled 2024-05-08: qty 10

## 2024-05-08 MED ORDER — ONDANSETRON HCL 4 MG PO TABS: 4.0000 mg | ORAL_TABLET | Freq: Four times a day (QID) | ORAL | Status: DC | PRN

## 2024-05-08 MED ORDER — EZETIMIBE 10 MG PO TABS
10.0000 mg | ORAL_TABLET | Freq: Every day | ORAL | Status: DC
  Administered 2024-05-09 – 2024-05-10 (×2): 10 mg via ORAL
  Filled 2024-05-08 (×2): qty 1

## 2024-05-08 MED ORDER — METRONIDAZOLE 500 MG/100ML IV SOLN
500.0000 mg | Freq: Two times a day (BID) | INTRAVENOUS | Status: DC
  Administered 2024-05-09 – 2024-05-10 (×3): 500 mg via INTRAVENOUS
  Filled 2024-05-08 (×3): qty 100

## 2024-05-08 MED ORDER — INSULIN ASPART PROT & ASPART (70-30 MIX) 100 UNIT/ML ~~LOC~~ SUSP
40.0000 [IU] | Freq: Two times a day (BID) | SUBCUTANEOUS | Status: DC
  Administered 2024-05-09 (×2): 40 [IU] via SUBCUTANEOUS
  Filled 2024-05-08 (×2): qty 10

## 2024-05-08 MED ORDER — ONDANSETRON HCL 4 MG/2ML IJ SOLN
4.0000 mg | Freq: Four times a day (QID) | INTRAMUSCULAR | Status: DC | PRN
  Administered 2024-05-08: 4 mg via INTRAVENOUS
  Filled 2024-05-08 (×2): qty 2

## 2024-05-08 MED ORDER — METOPROLOL TARTRATE 25 MG PO TABS
25.0000 mg | ORAL_TABLET | Freq: Two times a day (BID) | ORAL | Status: DC
  Administered 2024-05-09 – 2024-05-10 (×4): 25 mg via ORAL
  Filled 2024-05-08 (×4): qty 1

## 2024-05-08 MED ORDER — INSULIN ISOPHANE & REGULAR (HUMAN 70-30)100 UNIT/ML KWIKPEN: 40.0000 [IU] | PEN_INJECTOR | Freq: Two times a day (BID) | SUBCUTANEOUS | Status: DC

## 2024-05-08 MED ORDER — HYDROMORPHONE HCL 1 MG/ML IJ SOLN
0.5000 mg | Freq: Once | INTRAMUSCULAR | Status: AC
  Administered 2024-05-08: 0.5 mg via INTRAVENOUS
  Filled 2024-05-08: qty 0.5

## 2024-05-08 MED ORDER — FESOTERODINE FUMARATE ER 4 MG PO TB24
4.0000 mg | ORAL_TABLET | Freq: Every day | ORAL | Status: DC
Start: 2024-05-09 — End: 2024-05-10
  Administered 2024-05-09 – 2024-05-10 (×2): 4 mg via ORAL
  Filled 2024-05-08 (×3): qty 1

## 2024-05-08 NOTE — ED Notes (Addendum)
 pt says he needs his insulin  70/30 night dose, it isn't scheduled to be given until tomorrow am - secure chat sent to Dr Briana and Dr Lawence making aware of request and to please advise

## 2024-05-08 NOTE — ED Triage Notes (Signed)
 Pt states he was seen here x4days ago for a left buttock abscess that we drained. Pt states he has been following his wound care instructions but the abscess is hard, red and swollen. Pts is very uncomfortable.

## 2024-05-08 NOTE — ED Notes (Signed)
 Ramonita, [facility] made aware of need for insulin  aspart protamine- aspart (NOVOLOG  MIX 70/30) injection 40 Units  Still a/w other meds for pt

## 2024-05-08 NOTE — H&P (Signed)
 History and Physical    Patient: [name] FMW:[id number] DOB: 1960-11-08 DOA: 05/08/2024 DOS: the patient was seen and examined on 05/08/2024 PCP: [name], MD  Patient coming from: Home  Chief Complaint:  Chief Complaint  Patient presents with   Abscess   HPI: [name] is a 63 y.o. male with medical history significant of diabetes mellitus type 2, hypertension, polycythemia vera, CAD s/p CABG x4, chronic gastritis, neuropathy, chronic pancreatitis. Patient presented secondary to non-healing buttock infection. Patient reports that a few weeks ago, he was riding on the floor putting down vinyl flooring and pinched his left buttock, developing an abscess. He was seen on 9/12, had an abscess drained and discharged on doxycycline . His infection unfortunately continued to worsen and his PCP referred him to general surgery, who referred him to the emergency department. No fevers, chills, nausea, vomiting, diarrhea, constipation.   Review of Systems: As mentioned in the history of present illness. All other systems reviewed and are negative.  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  Patent coronary bypass grafts with a free radial graft to PDA and LIMA to the  diag.   Diabetes mellitus    Diabetic peripheral neuropathy (HCC) 05/06/2020   Dyslipidemia    Poorly controlled, probably related to his DM (Some of this is secondary to his inability to afford medications).   ED (erectile dysfunction)    Esophageal yeast infection (HCC)    Gastroparesis    Hypertension    Metaplasia of esophagus    Pancreatitis chronic    Pseudocyst   Polycythemia vera(238.4) 04/26/2012   PONV (postoperative nausea and vomiting)    Past Surgical History:  Procedure Laterality Date    APPENDECTOMY     1980   BIOPSY  11/11/2021   Procedure: BIOPSY;  Surgeon: Golda Claudis PENNER, MD;  [facility];  Service: Endoscopy;;   CARDIAC CATHETERIZATION     CHOLECYSTECTOMY     COLONOSCOPY  07/31/1985   COLONOSCOPY N/A 03/11/2016   Procedure: COLONOSCOPY;  Surgeon: Claudis PENNER Golda, MD;  Location: [facility];  Service: Endoscopy;  Laterality: N/A;   COLONOSCOPY WITH PROPOFOL  N/A 11/11/2021   Procedure: COLONOSCOPY WITH PROPOFOL ;  Surgeon: Golda Claudis PENNER, MD;  Location: [facility];  Service: Endoscopy;  Laterality: N/A;  205   CORONARY ANGIOPLASTY WITH STENT PLACEMENT  08/23/2004   CORONARY ARTERY BYPASS GRAFT  11/24/2004   ESOPHAGOGASTRODUODENOSCOPY  09/19/2001   ESOPHAGOGASTRODUODENOSCOPY N/A 03/11/2016   Procedure: ESOPHAGOGASTRODUODENOSCOPY (EGD);  Surgeon: Claudis PENNER Golda, MD;  [facility]: [facility];  Service: Endoscopy;  Laterality: N/A;  12:00   ESOPHAGOGASTRODUODENOSCOPY (EGD) WITH PROPOFOL  N/A 11/11/2021   Procedure: ESOPHAGOGASTRODUODENOSCOPY (EGD) WITH PROPOFOL ;  Surgeon: Golda Claudis PENNER, MD;  [facility]: [facility];  Service: Endoscopy;  Laterality: N/A;   ESOPHAGOGASTRODUODENOSCOPY (EGD) WITH PROPOFOL  N/A 10/25/2023   Procedure: ESOPHAGOGASTRODUODENOSCOPY (EGD) WITH PROPOFOL ;  Surgeon: Eartha Angelia Sieving, MD;  Location: [facility];  Service: Gastroenterology;  Laterality: N/A;  10:45am;asa 3   FINGER TENDON REPAIR Bilateral    in middle finger   KNEE SURGERY     left knee   NASAL SEPTUM SURGERY     POLYPECTOMY  11/11/2021  Procedure: POLYPECTOMY;  Surgeon: Golda Claudis PENNER, MD;  Location: [facility];  Service: Endoscopy;;   Social History:  reports that he has never smoked. He has never used smokeless tobacco. He reports that he does not currently use alcohol. He reports that he does not use drugs.  Allergies  Allergen Reactions   Iodinated Contrast Media Hives, Itching and Nausea Only    Flushing, Burning, itching Other  reaction(s): Angioedema (ALLERGY/intolerance)    Iohexol  Hives and Shortness Of Breath     Desc: HIVES,SOB NEEDS PREP.MEDS    Metoclopramide Hcl Hives    Patient became very spastic   Alfuzosin  Hcl Er     Dropped blood pressure   Sulfonamide Derivatives Hives    Large hives   Enalapril  Cough   Penicillins Rash    Has patient had a PCN reaction causing immediate rash, facial/tongue/throat swelling, SOB or lightheadedness with hypotension: Yes Has patient had a PCN reaction causing severe rash involving mucus membranes or skin necrosis: No Has patient had a PCN reaction that required hospitalization No Has patient had a PCN reaction occurring within the last 10 years: No. If all of the above answers are NO, then may proceed with Cephalosporin use.    Sulfa Antibiotics Rash    Other Reaction: Not Assessed    Family History  Problem Relation Age of Onset   Coronary artery disease Mother        strong family history of   Heart attack Mother        dying of (at age 69)   Diabetes Mother    Diabetes Sister    Diabetes Brother    Pancreatitis Brother    Healthy Daughter    Hyperlipidemia Daughter    Heart disease Brother    Hypertension Brother    Hyperlipidemia Brother     Prior to Admission medications   Medication Sig Start Date End Date Taking? Authorizing Provider  Cholecalciferol  125 MCG (5000 UT) TABS Take 10,000 Units by mouth daily.    [provider]  clopidogrel  (PLAVIX ) 75 MG tablet TAKE 1 TABLET BY MOUTH ONCE DAILY as a blood thinner 09/19/23   [name], MD  COMFORT EZ PEN NEEDLES 32G X 4 MM MISC USE AS DIRECTED FOR injecting insulin  01/17/24   [name], MD  Continuous Glucose Receiver (FREESTYLE LIBRE 3 READER) DEVI Use it to check blood glucose as instructed. 11/30/23   [name], MD  Continuous Glucose Sensor (FREESTYLE LIBRE 3 PLUS SENSOR) MISC Change sensor every 15 days. 11/30/23   [name], MD  diazepam  (VALIUM ) 10 MG tablet  Take 0.5 tablets (5 mg total) by mouth at bedtime as needed for anxiety or sleep. 01/29/24   [name], MD  doxycycline  (VIBRAMYCIN ) 100 MG capsule Take 1 capsule (100 mg total) by mouth 2 (two) times daily. 05/04/24   Idol, Julie, PA-C  DULoxetine  (CYMBALTA ) 60 MG capsule TAKE ONE CAPSULE BY MOUTH DAILY 11/21/23   [name], MD  ezetimibe  (ZETIA ) 10 MG tablet TAKE 1 TABLET BY MOUTH DAILY 12/22/23   [name], MD  gabapentin  (NEURONTIN ) 300 MG capsule Take 1 capsule (300 mg total) by mouth 3 (three) times daily. 04/09/24   [name], MD  hydrochlorothiazide  (HYDRODIURIL ) 25 MG tablet TAKE 1 TABLET BY MOUTH IN THE MORNING 02/06/24   [name], MD  HYDROcodone -acetaminophen  (NORCO) 10-325 MG tablet Take 1 tablet by mouth every 6 (six) hours as needed for severe  pain (pain score 7-10). 04/27/24   [name], MD  icosapent  Ethyl (VASCEPA ) 1 g capsule TAKE TWO CAPSULES BY MOUTH TWICE DAILY 03/05/24   Lavona Agent, MD  insulin  isophane & regular human KwikPen (NOVOLIN  70/30 KWIKPEN) (70-30) 100 UNIT/ML KwikPen Inject 60 Units into the skin 2 (two) times daily. 12/06/23   [name], MD  lansoprazole  (PREVACID ) 30 MG capsule Take 1 capsule (30 mg total) by mouth 2 (two) times daily before a meal. 08/12/23   Rudy Josette RAMAN, PA-C  losartan  (COZAAR ) 50 MG tablet Take 1 tablet (50 mg total) by mouth daily. 04/09/24   [name], MD  metFORMIN  (GLUCOPHAGE -XR) 500 MG 24 hr tablet TAKE 1 TABLET BY MOUTH TWICE DAILY WITH A MEAL 12/05/23   [name], MD  methocarbamol  (ROBAXIN ) 500 MG tablet Take 1 tablet (500 mg total) by mouth every 8 (eight) hours as needed for muscle spasms. 04/27/24   [name], MD  metoprolol  tartrate (LOPRESSOR ) 25 MG tablet TAKE 1 TABLET BY MOUTH TWICE DAILY 03/12/24   [name], MD  nitroGLYCERIN  (NITROSTAT ) 0.4 MG SL tablet Place 0.4 mg under the tongue every 5 (five) minutes as needed for chest pain. Reported on 02/26/2016    [provider]  NONFORMULARY OR COMPOUNDED ITEM Apply 1 application. topically daily as needed (neuropathy). Compounded cream for foot pain. Gets at [employer].    [provider]  ondansetron  (ZOFRAN ) 4 MG tablet Take 1 tablet (4 mg total) by mouth every 8 (eight) hours as needed for nausea or vomiting. 01/20/23   [name], MD  polyethylene glycol (MIRALAX  / GLYCOLAX ) 17 g packet Take 17 g by mouth daily as needed for moderate constipation.    [provider]  tadalafil  (CIALIS ) 20 MG tablet Take 1 tablet (20 mg total) by mouth daily as needed. 09/21/23   McKenzie, Belvie CROME, MD  tadalafil  (CIALIS ) 5 MG tablet Take 1 tablet (5 mg total) by mouth daily as needed for erectile dysfunction. 09/21/23   Sherrilee Belvie CROME, MD    Physical Exam: Vitals:   05/08/24 1333 05/08/24 1334 05/08/24 1350  BP: 127/88    Pulse: 73    Resp: 16    Temp: 98 F (36.7 C)    TempSrc: Oral    SpO2: 99%  99%  Weight:  79 kg   Height:  5' 9.5 (1.765 m)    General exam: Appears calm and comfortable and in no acute distress. Conversant Respiratory: Clear to auscultation. Respiratory effort normal with no intercostal retractions or use of accessory muscles Cardiovascular: S1 & S2 heard, RRR. No murmurs, rubs, gallops or clicks. No BLE edema Gastrointestinal: Abdomen is mildly distended, soft and non-tender. No masses felt. Normal bowel sounds heard Neurologic: No focal neurological deficits Musculoskeletal: No calf tenderness Skin: No cyanosis. Induration with tenderness located on inner right buttock that travels ventrally towards left perineal area with mild erythema Psychiatry: Alert and oriented x4. Memory intact. Mood & affect appropriate  Data Reviewed: There are no new results to review at this time.  Assessment and Plan:  Cellulitis Involving the left buttock and perineum. Patient failed outpatient treatment (doxycycline  monotherapy). CT abdomen/pelvis significant for no  drainable abscess but is significant for fistula tract. EDP spoke with radiology who recommended non-emergent MRI w/wo contrast. -Continue aztreonam  and Flagyl  -Continue analgesics  Diabetes mellitus type 2 Uncontrolled based on last hemoglobin A1C of 9.4% from August 2025. Patient is on metformin  and  Novolin  70/30 as an outpatient. -Resume home Novolin  70/30 at reduced dose of 40 units BID -Hold metformin  while admitted  Primary hypertension -Continue home hydrochlorothiazide  and losartan   Pseudohyponatremia Secondary to hyperglycemia. Corrected sodium of 134-135  CAD Patient is s/p CABG x4. Patient follows with cardiology. -Continue home Zetia , Vascepa  -Hold Plavix  pending surgery recommendations in AM  Peripheral neuropathy -Continue gabapentin  at bedtime  History of polycythemia vera Noted. Patient previously managed with phlebotomy. Hematocrit of 44.5 on admission.   Advance Care Planning:   Code Status: Full Code  Consults: None  Family Communication: Wife at bedside   Author: Elgin Lam, MD 05/08/2024 5:13 PM  For on call review [email or web address].

## 2024-05-08 NOTE — ED Provider Notes (Signed)
 [place] EMERGENCY DEPARTMENT AT [facility] Provider Note   CSN: [id number] Arrival date & time: 05/08/24  1259     Patient presents with: Abscess   [name] is a 63 y.o. male.    Abscess Patient presents with buttock pain.  Seen 4 days ago for perianal abscess.  Drained.  Now more pain.  States difficulty with urination and difficulty having a bowel movement.  No fevers.  States is diabetic and states her sugars been running in the 200s.    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  Patent coronary bypass grafts with a free radial graft to PDA and LIMA to the  diag.   Diabetes mellitus    Diabetic peripheral neuropathy (HCC) 05/06/2020   Dyslipidemia    Poorly controlled, probably related to his DM (Some of this is secondary to his inability to afford medications).   ED (erectile dysfunction)    Esophageal yeast infection (HCC)    Gastroparesis    Hypertension    Metaplasia of esophagus    Pancreatitis chronic    Pseudocyst   Polycythemia vera(238.4) 04/26/2012   PONV (postoperative nausea and vomiting)     Prior to Admission medications   Medication Sig Start Date End Date Taking? Authorizing Provider  Cholecalciferol  125 MCG (5000 UT) TABS Take 10,000 Units by mouth daily.    [provider]  clopidogrel  (PLAVIX ) 75 MG tablet TAKE 1 TABLET BY MOUTH ONCE DAILY as a blood thinner 09/19/23   Tobie, Suzzane POUR, MD  COMFORT EZ PEN NEEDLES 32G X 4 MM MISC USE AS DIRECTED FOR injecting insulin  01/17/24   [name], MD  Continuous Glucose Receiver (FREESTYLE LIBRE 3 READER) DEVI Use it to check blood glucose as instructed. 11/30/23   [name], MD  Continuous Glucose Sensor (FREESTYLE LIBRE 3 PLUS SENSOR) MISC Change sensor every 15  days. 11/30/23   [name], MD  diazepam  (VALIUM ) 10 MG tablet Take 0.5 tablets (5 mg total) by mouth at bedtime as needed for anxiety or sleep. 01/29/24   [name], MD  doxycycline  (VIBRAMYCIN ) 100 MG capsule Take 1 capsule (100 mg total) by mouth 2 (two) times daily. 05/04/24   Idol, Julie, PA-C  DULoxetine  (CYMBALTA ) 60 MG capsule TAKE ONE CAPSULE BY MOUTH DAILY 11/21/23   [name], MD  ezetimibe  (ZETIA ) 10 MG tablet TAKE 1 TABLET BY MOUTH DAILY 12/22/23   [name], MD  gabapentin  (NEURONTIN ) 300 MG capsule Take 1 capsule (300 mg total) by mouth 3 (three) times daily. 04/09/24   [name], MD  hydrochlorothiazide  (HYDRODIURIL ) 25 MG tablet TAKE 1 TABLET BY MOUTH IN THE MORNING 02/06/24   [name], MD  HYDROcodone -acetaminophen  (NORCO) 10-325 MG tablet Take 1 tablet by mouth every 6 (six) hours as needed for severe pain (pain score 7-10). 04/27/24   [name], MD  icosapent  Ethyl (VASCEPA ) 1 g capsule TAKE TWO CAPSULES BY MOUTH TWICE DAILY 03/05/24   Lavona Agent, MD  insulin  isophane & regular human KwikPen (NOVOLIN  70/30 KWIKPEN) (70-30) 100 UNIT/ML KwikPen Inject 60 Units into the skin 2 (two) times daily. 12/06/23   [name], MD  lansoprazole  (PREVACID ) 30 MG capsule Take 1 capsule (30 mg total) by mouth 2 (two) times daily before a meal. 08/12/23   Rudy Josette RAMAN, PA-C  losartan  (COZAAR ) 50 MG tablet Take 1 tablet (50 mg total) by mouth daily. 04/09/24   [name], MD  metFORMIN  (GLUCOPHAGE -XR) 500 MG 24 hr tablet TAKE 1 TABLET BY MOUTH TWICE DAILY WITH A MEAL 12/05/23   [name], MD  methocarbamol  (ROBAXIN ) 500 MG tablet Take 1 tablet (500 mg total) by mouth every 8 (eight) hours as needed for muscle spasms. 04/27/24   [name], MD  metoprolol  tartrate (LOPRESSOR ) 25 MG tablet TAKE 1 TABLET BY MOUTH TWICE DAILY 03/12/24   Patel, Rutwik K, MD  nitroGLYCERIN  (NITROSTAT ) 0.4 MG SL tablet Place 0.4 mg under the tongue every 5 (five)  minutes as needed for chest pain. Reported on 02/26/2016    [provider]  NONFORMULARY OR COMPOUNDED ITEM Apply 1 application. topically daily as needed (neuropathy). Compounded cream for foot pain. Gets at [employer].    [provider]  ondansetron  (ZOFRAN ) 4 MG tablet Take 1 tablet (4 mg total) by mouth every 8 (eight) hours as needed for nausea or vomiting. 01/20/23   [name], MD  polyethylene glycol (MIRALAX  / GLYCOLAX ) 17 g packet Take 17 g by mouth daily as needed for moderate constipation.    [provider]  tadalafil  (CIALIS ) 20 MG tablet Take 1 tablet (20 mg total) by mouth daily as needed. 09/21/23   McKenzie, Belvie CROME, MD  tadalafil  (CIALIS ) 5 MG tablet Take 1 tablet (5 mg total) by mouth daily as needed for erectile dysfunction. 09/21/23   McKenzie, Belvie CROME, MD    Allergies: Iodinated contrast media, Iohexol , Metoclopramide hcl, Alfuzosin  hcl er, Sulfonamide derivatives, Enalapril , Penicillins, and Sulfa antibiotics    Review of Systems  Updated Vital Signs BP 127/88   Pulse 73   Temp 98 F (36.7 C) (Oral)   Resp 16   Ht 5' 9.5 (1.765 m)   Wt 79 kg   SpO2 99%   BMI 25.35 kg/m   Physical Exam Vitals and nursing note reviewed.  Genitourinary:    Comments: Tender indurated area along the left medial buttock inferiorly.  Goes along the perineum to the base of the scrotum.  Approximately 20 cm long.  Does progress down towards the sphincter.  Does not appear to go up into the scrotum. Skin:    Capillary Refill: Capillary refill takes less than 2 seconds.  Neurological:     Mental Status: He is alert and oriented to person, place, and time.     (all labs ordered are listed, but only abnormal results are displayed) Labs Reviewed  COMPREHENSIVE METABOLIC PANEL WITH GFR - Abnormal; Notable for the following components:      Result Value   Sodium 132 (*)    Chloride 95 (*)    Glucose, Bld 239 (*)    BUN 24 (*)    Total Protein  6.3 (*)    Albumin 2.8 (*)    All other components within normal limits  CBC WITH DIFFERENTIAL/PLATELET - Abnormal; Notable for the following components:   MCV 79.3 (*)    MCH 23.9 (*)    RDW 18.7 (*)    All other components within normal limits  CULTURE, BLOOD (ROUTINE X 2)  CULTURE, BLOOD (ROUTINE X 2)  LACTIC ACID, PLASMA  LACTIC ACID, PLASMA    EKG: None  Radiology: CT PELVIS  WO CONTRAST Result Date: 05/08/2024 CLINICAL DATA:  Provided history: Perianal abscess or fistula suspected Recent drainage of left buttock abscess.  Redness and swelling. EXAM: CT PELVIS WITHOUT CONTRAST TECHNIQUE: Multidetector CT imaging of the pelvis was performed following the standard protocol without intravenous contrast. RADIATION DOSE REDUCTION: This exam was performed according to the departmental dose-optimization program which includes automated exposure control, adjustment of the mA and/or kV according to patient size and/or use of iterative reconstruction technique. COMPARISON:  CT 08/27/2018 FINDINGS: Urinary Tract:  No bladder wall thickening or inflammation. Bowel: Left perirectal/perianal soft tissue tract extending to the gluteal skin surface posteriorly, series 2, images 47 and 48. No drainable fluid collection. There is mild anorectal wall thickening. Moderate sigmoid diverticulosis without diverticulitis. No abnormal distension or inflammation of included bowel loops. Vascular/Lymphatic: Iliac atherosclerosis. No enlarged pelvic lymph nodes. Reproductive:  Prominent prostate. Other: Linear soft tissue track extending from the left anorectum posteriorly to the skin gluteal skin surface. This measures up to 6 mm in thickness. No free fluid in the pelvis. Fat in both inguinal canals. Adjacent fat stranding and inflammation. There is no drainable fluid collection. No soft tissue gas. 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 Chronic L5 pars defects with grade  1 anterolisthesis and degenerative change. No acute osseous findings. IMPRESSION: 1. Left perirectal/perianal soft tissue tract extending to the gluteal skin surface posteriorly. No drainable fluid collection. 2. Mild anorectal wall thickening. 3. Sigmoid diverticulosis without diverticulitis. 4. 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 Chronic L5 pars defects with grade 1 anterolisthesis and degenerative change. Electronically Signed   By: Andrea Gasman M.D.   On: 05/08/2024 16:24     Procedures   Medications Ordered in the ED  aztreonam  (AZACTAM ) 2 g in sodium chloride  0.9 % 100 mL IVPB (2 g Intravenous New Bag/Given 05/08/24 1615)    And  metroNIDAZOLE  (FLAGYL ) IVPB 500 mg (0 mg Intravenous Stopped 05/08/24 1630)  HYDROmorphone  (DILAUDID ) injection 0.5 mg (0.5 mg Intravenous Given 05/08/24 1540)                                    Medical Decision Making Amount and/or Complexity of Data Reviewed Radiology: ordered.  Risk Prescription drug management. Decision regarding hospitalization.   Patient with tender indurated swollen area on inferior buttock/perineum.  Differential diagnose includes cellulitis abscess/Fournier's gangrene.  Will get CT scan.  Will start without contrast due to time constraints with his IV contrast allergy.  It sounds as if he has had anaphylaxis in the past.  Normally gets the 13-hour prep.  Also has had what sounds like anaphylaxis to penicillins.  Will give aztreonam  and Flagyl .  Last ate just prior to arrival.  Blood work overall reassuring.  Does not appear septic at this time.  CT scan done without contrast and does show cellulitis but not clear abscess.  Also potential fistula tract but unable to diagnose on CT scan necessarily.  Discussed with Dr. Gasman from radiology and will likely need MRI.  However think patient benefit from admission to the hospital.  Will discuss with hospitalist.      Final diagnoses:   Cellulitis of buttock, left    ED Discharge Orders     None          Patsey Lot, MD 05/08/24 1641

## 2024-05-08 NOTE — ED Notes (Signed)
 Ramonita, [facility] made aware of need for icosapent  Ethyl (VASCEPA ) 1 g capsule 2 g

## 2024-05-09 DIAGNOSIS — Z7902 Long term (current) use of antithrombotics/antiplatelets: Secondary | ICD-10-CM | POA: Diagnosis not present

## 2024-05-09 DIAGNOSIS — E1142 Type 2 diabetes mellitus with diabetic polyneuropathy: Secondary | ICD-10-CM | POA: Diagnosis not present

## 2024-05-09 DIAGNOSIS — Z79899 Other long term (current) drug therapy: Secondary | ICD-10-CM | POA: Diagnosis not present

## 2024-05-09 DIAGNOSIS — L03317 Cellulitis of buttock: Secondary | ICD-10-CM | POA: Diagnosis not present

## 2024-05-09 DIAGNOSIS — E11628 Type 2 diabetes mellitus with other skin complications: Secondary | ICD-10-CM | POA: Diagnosis not present

## 2024-05-09 DIAGNOSIS — E1165 Type 2 diabetes mellitus with hyperglycemia: Secondary | ICD-10-CM | POA: Diagnosis not present

## 2024-05-09 DIAGNOSIS — Z888 Allergy status to other drugs, medicaments and biological substances status: Secondary | ICD-10-CM | POA: Diagnosis not present

## 2024-05-09 DIAGNOSIS — Z880 Allergy status to penicillin: Secondary | ICD-10-CM | POA: Diagnosis not present

## 2024-05-09 DIAGNOSIS — D45 Polycythemia vera: Secondary | ICD-10-CM | POA: Diagnosis not present

## 2024-05-09 DIAGNOSIS — I10 Essential (primary) hypertension: Secondary | ICD-10-CM | POA: Diagnosis not present

## 2024-05-09 DIAGNOSIS — K3184 Gastroparesis: Secondary | ICD-10-CM | POA: Diagnosis not present

## 2024-05-09 DIAGNOSIS — Z833 Family history of diabetes mellitus: Secondary | ICD-10-CM | POA: Diagnosis not present

## 2024-05-09 DIAGNOSIS — Z91041 Radiographic dye allergy status: Secondary | ICD-10-CM | POA: Diagnosis not present

## 2024-05-09 DIAGNOSIS — Z955 Presence of coronary angioplasty implant and graft: Secondary | ICD-10-CM | POA: Diagnosis not present

## 2024-05-09 DIAGNOSIS — Z7984 Long term (current) use of oral hypoglycemic drugs: Secondary | ICD-10-CM | POA: Diagnosis not present

## 2024-05-09 DIAGNOSIS — E1143 Type 2 diabetes mellitus with diabetic autonomic (poly)neuropathy: Secondary | ICD-10-CM | POA: Diagnosis not present

## 2024-05-09 DIAGNOSIS — Z882 Allergy status to sulfonamides status: Secondary | ICD-10-CM | POA: Diagnosis not present

## 2024-05-09 DIAGNOSIS — Z8249 Family history of ischemic heart disease and other diseases of the circulatory system: Secondary | ICD-10-CM | POA: Diagnosis not present

## 2024-05-09 DIAGNOSIS — I2510 Atherosclerotic heart disease of native coronary artery without angina pectoris: Secondary | ICD-10-CM | POA: Diagnosis not present

## 2024-05-09 DIAGNOSIS — E785 Hyperlipidemia, unspecified: Secondary | ICD-10-CM | POA: Diagnosis not present

## 2024-05-09 DIAGNOSIS — K861 Other chronic pancreatitis: Secondary | ICD-10-CM | POA: Diagnosis not present

## 2024-05-09 DIAGNOSIS — Z83438 Family history of other disorder of lipoprotein metabolism and other lipidemia: Secondary | ICD-10-CM | POA: Diagnosis not present

## 2024-05-09 DIAGNOSIS — L0390 Cellulitis, unspecified: Secondary | ICD-10-CM | POA: Diagnosis present

## 2024-05-09 DIAGNOSIS — Z951 Presence of aortocoronary bypass graft: Secondary | ICD-10-CM | POA: Diagnosis not present

## 2024-05-09 DIAGNOSIS — K5730 Diverticulosis of large intestine without perforation or abscess without bleeding: Secondary | ICD-10-CM | POA: Diagnosis not present

## 2024-05-09 DIAGNOSIS — L0231 Cutaneous abscess of buttock: Secondary | ICD-10-CM | POA: Diagnosis not present

## 2024-05-09 LAB — CBC
HCT: 44.1 % (ref 39.0–52.0)
Hemoglobin: 13.4 g/dL (ref 13.0–17.0)
MCH: 24.2 pg — ABNORMAL LOW (ref 26.0–34.0)
MCHC: 30.4 g/dL (ref 30.0–36.0)
MCV: 79.6 fL — ABNORMAL LOW (ref 80.0–100.0)
Platelets: 232 K/uL (ref 150–400)
RBC: 5.54 MIL/uL (ref 4.22–5.81)
RDW: 18.7 % — ABNORMAL HIGH (ref 11.5–15.5)
WBC: 6.5 K/uL (ref 4.0–10.5)
nRBC: 0 % (ref 0.0–0.2)

## 2024-05-09 LAB — HIV ANTIBODY (ROUTINE TESTING W REFLEX): HIV Screen 4th Generation wRfx: NONREACTIVE

## 2024-05-09 LAB — BASIC METABOLIC PANEL WITH GFR
Anion gap: 8 (ref 5–15)
BUN: 25 mg/dL — ABNORMAL HIGH (ref 8–23)
CO2: 29 mmol/L (ref 22–32)
Calcium: 9.3 mg/dL (ref 8.9–10.3)
Chloride: 98 mmol/L (ref 98–111)
Creatinine, Ser: 1.26 mg/dL — ABNORMAL HIGH (ref 0.61–1.24)
GFR, Estimated: >60 mL/min (ref >60)
Glucose, Bld: 179 mg/dL — ABNORMAL HIGH (ref 70–99)
Potassium: 4 mmol/L (ref 3.5–5.1)
Sodium: 135 mmol/L (ref 135–145)

## 2024-05-09 LAB — GLUCOSE, CAPILLARY: Glucose-Capillary: 210 mg/dL — ABNORMAL HIGH (ref 70–99)

## 2024-05-09 LAB — CBG MONITORING, ED: Glucose-Capillary: 147 mg/dL — ABNORMAL HIGH (ref 70–99)

## 2024-05-09 MED ORDER — VITAMIN D 25 MCG (1000 UNIT) PO TABS
1000.0000 [IU] | ORAL_TABLET | Freq: Every day | ORAL | Status: DC
  Administered 2024-05-09 – 2024-05-10 (×2): 1000 [IU] via ORAL
  Filled 2024-05-09 (×3): qty 1

## 2024-05-09 MED ORDER — INSULIN ASPART PROT & ASPART (70-30 MIX) 100 UNIT/ML ~~LOC~~ SUSP
60.0000 [IU] | Freq: Two times a day (BID) | SUBCUTANEOUS | Status: DC
Start: 2024-05-09 — End: 2024-05-10
  Administered 2024-05-09 – 2024-05-10 (×2): 60 [IU] via SUBCUTANEOUS
  Filled 2024-05-09: qty 10

## 2024-05-09 MED ORDER — SILODOSIN 8 MG PO CAPS
8.0000 mg | ORAL_CAPSULE | Freq: Every day | ORAL | Status: DC
  Filled 2024-05-09: qty 1

## 2024-05-09 NOTE — TOC CM/SW Note (Signed)
 Transition of Care [facility]) - Inpatient Brief Assessment   Patient Details  Name: [name] MRN: [id number] Date of Birth: 1960-12-03  [facility] of [facility]) CM/SW Contact:    Lucie Lunger, LCSWA Phone Number: 05/09/2024, 9:16 AM   Clinical Narrative: Transition of Care Department [facility]) has reviewed patient and no TOC needs have been identified at this time. We will continue to monitor patient advancement through interdiciplinary progression rounds. If new patient transition needs arise, please place a TOC consult.  Transition of Care Asessment: Insurance and Status: Insurance coverage has been reviewed Patient has primary care physician: Yes Home environment has been reviewed: From home Prior level of function:: Independent Prior/Current Home Services: No current home services Social Drivers of Health Review: SDOH reviewed no interventions necessary Readmission risk has been reviewed: Yes Transition of care needs: no transition of care needs at this time

## 2024-05-09 NOTE — ED Notes (Signed)
 Pt given crackers and diet drink

## 2024-05-09 NOTE — Consult Note (Addendum)
 [facility] Consult  Reason for Consult: Perineal cellulitis, concern for possible anal fistula Referring Physician: Dr. Briana  Chief Complaint   Abscess     HPI: [name] is a 63 y.o. male who presents to the hospital with worsening perineal swelling and pain.  He initially was placing tiles, and felt that he pinched his buttock while moving around.  A week and a half later, he felt a very tender area on his buttock which caused him to present to the emergency department.  He underwent incision and drainage of a left buttock abscess and was sent home with doxycycline .  His pain initially improved for a day, but then started to worsen and his swelling started to progress up into the perineum.  He denies any significant drainage from anywhere on his perineum, from the previous I&D site, and from his anus.  He had subjective fevers and chills but never measured a temperature.  His PMH is significant for hypertension, diabetes, hyperlipidemia, coronary artery disease status post CABG and subsequent stent placement, and polycythemia vera.  His abdominal surgical history is significant for open appendectomy and cholecystectomy.  In the emergency department, he was noted to be hemodynamically stable.  He has no leukocytosis.  He underwent a CT of the pelvis which demonstrated left perirectal/perianal soft tissue tract extending to the gluteal skin surface posteriorly without a drainable fluid collection.  Surrounding fat stranding and inflammation noted, no soft tissue gas.  This morning, the patient is feeling a little better, though he still complains of swelling and pain in this area.  The pain is better controlled for him.  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   Patent coronary bypass grafts with a free radial graft to PDA and LIMA to the  diag.   Diabetes mellitus    Diabetic peripheral neuropathy (HCC) 05/06/2020   Dyslipidemia    Poorly controlled, probably related to his DM (Some of this is secondary to his inability to afford medications).   ED (erectile dysfunction)    Esophageal yeast infection (HCC)    Gastroparesis    Hypertension    Metaplasia of esophagus    Pancreatitis chronic    Pseudocyst   Polycythemia vera(238.4) 04/26/2012   PONV (postoperative nausea and vomiting)     Past Surgical History:  Procedure Laterality Date   APPENDECTOMY     1980   BIOPSY  11/11/2021   Procedure: BIOPSY;  Surgeon: Golda Claudis PENNER, MD;  [facility];  Service: Endoscopy;;   CARDIAC CATHETERIZATION     CHOLECYSTECTOMY     COLONOSCOPY  07/31/1985   COLONOSCOPY N/A 03/11/2016   Procedure: COLONOSCOPY;  Surgeon: Claudis PENNER Golda, MD;  [facility];  Service: Endoscopy;  Laterality: N/A;   COLONOSCOPY WITH PROPOFOL  N/A 11/11/2021   Procedure: COLONOSCOPY WITH PROPOFOL ;  Surgeon: Golda Claudis PENNER, MD;  [facility];  Service: Endoscopy;  Laterality: N/A;  205   CORONARY ANGIOPLASTY WITH STENT PLACEMENT  08/23/2004   CORONARY ARTERY BYPASS GRAFT  11/24/2004   ESOPHAGOGASTRODUODENOSCOPY  09/19/2001   ESOPHAGOGASTRODUODENOSCOPY N/A 03/11/2016   Procedure: ESOPHAGOGASTRODUODENOSCOPY (EGD);  Surgeon: Claudis PENNER Golda, MD;  [facility];  Service:  Endoscopy;  Laterality: N/A;  12:00   ESOPHAGOGASTRODUODENOSCOPY (EGD) WITH PROPOFOL  N/A 11/11/2021   Procedure: ESOPHAGOGASTRODUODENOSCOPY (EGD) WITH PROPOFOL ;  Surgeon: Golda Claudis PENNER, MD;  [facility];  Service: Endoscopy;  Laterality: N/A;   ESOPHAGOGASTRODUODENOSCOPY (EGD) WITH PROPOFOL  N/A 10/25/2023   Procedure: ESOPHAGOGASTRODUODENOSCOPY (EGD) WITH PROPOFOL ;  Surgeon: Eartha Angelia Sieving, MD;  [facility];  Service:  Gastroenterology;  Laterality: N/A;  10:45am;asa 3   FINGER TENDON REPAIR Bilateral    in middle finger   KNEE SURGERY     left knee   NASAL SEPTUM SURGERY     POLYPECTOMY  11/11/2021   Procedure: POLYPECTOMY;  Surgeon: Golda Claudis PENNER, MD;  [facility];  Service: Endoscopy;;    Family History  Problem Relation Age of Onset   Coronary artery disease Mother        strong family history of   Heart attack Mother        dying of (at age 76)   Diabetes Mother    Diabetes Sister    Diabetes Brother    Pancreatitis Brother    Healthy Daughter    Hyperlipidemia Daughter    Heart disease Brother    Hypertension Brother    Hyperlipidemia Brother     Social History   Tobacco Use   Smoking status: Never   Smokeless tobacco: Never  Vaping Use   Vaping status: Never Used  Substance Use Topics   Alcohol use: Not Currently   Drug use: No    Medications: I have reviewed the patient's current medications.  Allergies  Allergen Reactions   Iodinated Contrast Media Hives, Itching and Nausea Only    Flushing, Burning, Itching, Angioedema    Iohexol  Hives and Shortness Of Breath    Needs prep meds    Metoclopramide Hcl Hives    Patient became very spastic   Alfuzosin  Hcl Er Other (See Comments)    Dropped blood pressure   Sulfonamide Derivatives Hives    Large hives   Enalapril  Cough   Penicillins Rash    Immediate rash, facial/tongue/throat swelling, SOB or lightheadedness with hypotension   Sulfa Antibiotics Rash     ROS:  Pertinent items are noted in HPI.  Blood pressure 122/82, pulse 64, temperature 97.7 F (36.5 C), temperature source Oral, resp. rate 19, height 5' 9.5 (1.765 m), weight 79 kg, SpO2 99%. Physical Exam Vitals reviewed.  Constitutional:      Appearance: Normal appearance.  HENT:     Head: Normocephalic and atraumatic.  Eyes:     Extraocular Movements: Extraocular movements intact.     Pupils: Pupils are equal, round, and reactive to  light.  Cardiovascular:     Rate and Rhythm: Normal rate.  Pulmonary:     Effort: Pulmonary effort is normal.  Abdominal:     General: There is no distension.     Palpations: Abdomen is soft.     Tenderness: There is no abdominal tenderness.  Genitourinary:    Comments: Previous left buttock I&D site noted with surrounding induration that spreads along the left buttock, left perirectal/perianal area, and left perineum up towards the base of the scrotum, no palpable fluctuance or fluid collections; mild erythema; DRE with palpable underlying/deep induration along the left side of the anus without obvious inflammation of the anus or purulent drainage Musculoskeletal:        General: Normal range of motion.     Cervical back: Normal range of motion.  Skin:  General: Skin is warm and dry.  Neurological:     General: No focal deficit present.     Mental Status: He is alert and oriented to person, place, and time.  Psychiatric:        Mood and Affect: Mood normal.        Behavior: Behavior normal.     Results: Results for orders placed or performed during the hospital encounter of 05/08/24 (from the past 48 hours)  Comprehensive metabolic panel     Status: Abnormal   Collection Time: 05/08/24  2:16 PM  Result Value Ref Range   Sodium 132 (L) 135 - 145 mmol/L   Potassium 4.1 3.5 - 5.1 mmol/L   Chloride 95 (L) 98 - 111 mmol/L   CO2 25 22 - 32 mmol/L   Glucose, Bld 239 (H) 70 - 99 mg/dL    Comment: Glucose reference range applies only to samples taken after fasting for at least 8 hours.   BUN 24 (H) 8 - 23 mg/dL   Creatinine, Ser 8.75 0.61 - 1.24 mg/dL   Calcium  9.2 8.9 - 10.3 mg/dL   Total Protein 6.3 (L) 6.5 - 8.1 g/dL   Albumin 2.8 (L) 3.5 - 5.0 g/dL   AST 16 15 - 41 U/L   ALT 10 0 - 44 U/L   Alkaline Phosphatase 58 38 - 126 U/L   Total Bilirubin 0.8 0.0 - 1.2 mg/dL   GFR, Estimated >39 >39 mL/min    Comment: (NOTE) Calculated using the CKD-EPI Creatinine Equation (2021)     Anion gap 12 5 - 15    Comment: Performed at [facility], [address] [place], [place] [address]  CBC with Differential     Status: Abnormal   Collection Time: 05/08/24  2:16 PM  Result Value Ref Range   WBC 4.5 4.0 - 10.5 K/uL   RBC 5.61 4.22 - 5.81 MIL/uL   Hemoglobin 13.4 13.0 - 17.0 g/dL   HCT 55.4 60.9 - 47.9 %   MCV 79.3 (L) 80.0 - 100.0 fL   MCH 23.9 (L) 26.0 - 34.0 pg   MCHC 30.1 30.0 - 36.0 g/dL   RDW 81.2 (H) 88.4 - 84.4 %   Platelets 215 150 - 400 K/uL   nRBC 0.0 0.0 - 0.2 %   Neutrophils Relative % 60 %   Neutro Abs 2.7 1.7 - 7.7 K/uL   Lymphocytes Relative 23 %   Lymphs Abs 1.0 0.7 - 4.0 K/uL   Monocytes Relative 8 %   Monocytes Absolute 0.4 0.1 - 1.0 K/uL   Eosinophils Relative 8 %   Eosinophils Absolute 0.4 0.0 - 0.5 K/uL   Basophils Relative 1 %   Basophils Absolute 0.0 0.0 - 0.1 K/uL   Immature Granulocytes 0 %   Abs Immature Granulocytes 0.02 0.00 - 0.07 K/uL    Comment: Performed at [facility], [address], [place], [place] [address]  Lactic acid, plasma     Status: None   Collection Time: 05/08/24  2:16 PM  Result Value Ref Range   Lactic Acid, Venous 1.6 0.5 - 1.9 mmol/L    Comment: Performed at [facility], [address], [place], [place] [address]  Culture, blood (routine x 2)     Status: None (Preliminary result)   Collection Time: 05/08/24  2:16 PM   Specimen: BLOOD  Result Value Ref Range   Specimen Description      BLOOD BLOOD RIGHT ARM Performed at [facility], [address], [place], [place] [address]    Special Requests      BOTTLES DRAWN AEROBIC AND ANAEROBIC Blood Culture results may not be optimal due to an inadequate volume of blood received in culture bottles Performed at [facility], [address] [place], [place] [address]    Culture      NO GROWTH <12 HOURS Performed at [facility], [address] [place], [place] [address]    Report Status PENDING   HIV Antibody (routine testing w rflx)     Status: None    Collection Time: 05/08/24  2:16 PM  Result Value Ref Range   HIV Screen 4th Generation wRfx Non Reactive Non Reactive    Comment: Performed at [facility], [address], [place], [place] [address]  Lactic acid, plasma     Status: None   Collection Time: 05/08/24  3:19 PM  Result Value Ref Range   Lactic Acid, Venous 1.7 0.5 - 1.9 mmol/L    Comment: Performed at [facility], [address], [place], [place] [address]  Basic metabolic panel     Status: Abnormal   Collection Time: 05/09/24  3:35 AM  Result Value Ref Range   Sodium 135 135 - 145 mmol/L   Potassium 4.0 3.5 - 5.1 mmol/L   Chloride 98 98 - 111 mmol/L   CO2 29 22 - 32 mmol/L   Glucose, Bld 179 (H) 70 - 99 mg/dL    Comment: Glucose reference range applies only to samples taken after fasting for at least 8 hours.   BUN 25 (H) 8 - 23 mg/dL   Creatinine, Ser 8.73 (H) 0.61 - 1.24 mg/dL   Calcium  9.3 8.9 - 10.3 mg/dL   GFR, Estimated >39 >39 mL/min    Comment: (NOTE) Calculated using the CKD-EPI Creatinine Equation (2021)    Anion gap 8 5 - 15    Comment: Performed at [facility], [address] [place], [place] [address]  CBC     Status: Abnormal   Collection Time: 05/09/24  3:35 AM  Result Value Ref Range   WBC 6.5 4.0 - 10.5 K/uL   RBC 5.54 4.22 - 5.81 MIL/uL   Hemoglobin 13.4 13.0 - 17.0 g/dL   HCT 55.8 60.9 - 47.9 %   MCV 79.6 (L) 80.0 - 100.0 fL   MCH 24.2 (L) 26.0 - 34.0 pg   MCHC 30.4 30.0 - 36.0 g/dL   RDW 81.2 (H) 88.4 - 84.4 %   Platelets 232 150 - 400 K/uL   nRBC 0.0 0.0 - 0.2 %    Comment: Performed at [facility], [address], [place], [place] [address]  CBG monitoring, ED     Status: Abnormal   Collection Time: 05/09/24  8:03 AM  Result Value Ref Range   Glucose-Capillary 147 (H) 70 - 99 mg/dL    Comment: Glucose reference range applies only to samples taken after fasting for at least 8 hours.    CT PELVIS WO CONTRAST Result Date: 05/08/2024 CLINICAL DATA:  Provided history: Perianal  abscess or fistula suspected Recent drainage of left buttock abscess.  Redness and swelling. EXAM: CT PELVIS WITHOUT CONTRAST TECHNIQUE: Multidetector CT imaging of the pelvis was performed following the standard protocol without intravenous contrast. RADIATION DOSE REDUCTION: This exam was performed according to the departmental dose-optimization program which includes automated exposure control, adjustment of the mA and/or kV according to patient size and/or use of iterative reconstruction technique. COMPARISON:  CT 08/27/2018 FINDINGS: Urinary  Tract:  No bladder wall thickening or inflammation. Bowel: Left perirectal/perianal soft tissue tract extending to the gluteal skin surface posteriorly, series 2, images 47 and 48. No drainable fluid collection. There is mild anorectal wall thickening. Moderate sigmoid diverticulosis without diverticulitis. No abnormal distension or inflammation of included bowel loops. Vascular/Lymphatic: Iliac atherosclerosis. No enlarged pelvic lymph nodes. Reproductive:  Prominent prostate. Other: Linear soft tissue track extending from the left anorectum posteriorly to the skin gluteal skin surface. This measures up to 6 mm in thickness. No free fluid in the pelvis. Fat in both inguinal canals. Adjacent fat stranding and inflammation. There is no drainable fluid collection. No soft tissue gas. 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 Chronic L5 pars defects with grade 1 anterolisthesis and degenerative change. No acute osseous findings. IMPRESSION: 1. Left perirectal/perianal soft tissue tract extending to the gluteal skin surface posteriorly. No drainable fluid collection. 2. Mild anorectal wall thickening. 3. Sigmoid diverticulosis without diverticulitis. 4. 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 Chronic L5 pars defects with grade 1 anterolisthesis and degenerative change. Electronically Signed   By: Andrea Gasman M.D.   On: 05/08/2024 16:24     Assessment & Plan:  [name] is a 63 y.o. male who was admitted with perineal cellulitis and concern for possible anal fistula.  Imaging and blood work evaluated by myself.  -On the patient's CT imaging, I see significant inflammatory changes in the perineum and left buttock without definitive fluid collections. -Given no evidence of fluid collection on imaging or physical exam, I recommend conservative treatment for the patient regarding the cellulitis and possible fistula with antibiotics and pain control.  Inflammation/induration and infection potentially progressed as doxycyline is a less than ideal treatment of abscesses in this location -Patient will likely need outpatient follow-up with colorectal surgery for anal exam under anesthesia and evaluation of a possible fistula-in-ano vs fistula.  Any treatment for fistula would be conservative with active inflammation without associated abscess -Continue aztreonam  and Flagyl  at this time -PRN pain control and antiemetics -No acute surgical intervention at this time -Discussed with the patient that if the area progresses, there becomes evidence of an abscess, or he clinically worsens, he may require surgical interventions, but I do not feel they are appropriate at this time -Recommend holding Plavix  while he remains inpatient in the event he needs operative intervention -Appreciate hospitalist recommendations  All questions were answered to the satisfaction of the patient.  Note: Portions of this report may have been transcribed using voice recognition software. Every effort has been made to ensure accuracy; however, inadvertent computerized transcription errors may still be present.   -- Dorothyann Brittle, DO [facility] [address] [place], [place] [address] [phone] (office)

## 2024-05-09 NOTE — Progress Notes (Signed)
 PROGRESS NOTE    [name]  FMW:[id number] DOB: Jun 20, 1961 DOA: 05/08/2024 PCP: [name], MD   Brief Narrative:    Assessment & Plan:   Principal Problem:   Cellulitis Active Problems:   Cellulitis of buttock, left   Assessment and Plan:   Cellulitis Involving the left buttock and perineum. Patient failed outpatient treatment (doxycycline  monotherapy). CT abdomen/pelvis significant for no drainable abscess but is significant for fistula tract.  -Continue aztreonam  and Flagyl .  Will add vancomycin to cover MRSA/gram-positive's -Continue analgesics -General Surgery to see today  Diabetes mellitus type 2 Uncontrolled based on last hemoglobin A1C of 9.4% from August 2025. Patient is on metformin  and Novolin  70/30 as an outpatient. -Resume home Novolin  70/30 at reduced dose of 40 units BID -Hold metformin  while admitted   Primary hypertension -Continue home hydrochlorothiazide  and losartan    Pseudohyponatremia Secondary to hyperglycemia. Corrected sodium of 134-135   CAD Patient is s/p CABG x4. Patient follows with cardiology. -Continue home Zetia , Vascepa  -Hold Plavix  pending surgery recommendations    Peripheral neuropathy -Continue gabapentin  at bedtime   History of polycythemia vera Noted. Patient previously managed with phlebotomy. Hematocrit of 44.5 on admission.     Advance Care Planning:   Code Status: Full Code   Consults: None   DVT prophylaxis:  Code Status:   Family Communication:  Disposition Plan: Status is: Observation     Consultants:   Procedures:   Antimicrobials:    Subjective: Patient seen and examined at the bedside.  She is afebrile.  Reports of ongoing pain on his buttocks.  Vital signs have been stable.  Tolerating antibiotics well.  Objective: Vitals:   05/09/24 0800 05/09/24 0857 05/09/24 1028 05/09/24 1053  BP: (!) 120/90  122/82   Pulse: 62  64   Resp: 16  19   Temp:  98.4 F (36.9 C) 97.7 F (36.5 C)    TempSrc:  Oral Oral   SpO2: 95%  99%   Weight:    72.7 kg  Height:        Intake/Output Summary (Last 24 hours) at 05/09/2024 1116 Last data filed at 05/08/2024 1925 Gross per 24 hour  Intake 200 ml  Output --  Net 200 ml   Filed Weights   05/08/24 1334 05/09/24 1053  Weight: 79 kg 72.7 kg    Examination:  General exam: Appears calm and comfortable  Respiratory system: Bilateral decreased breath sounds at bases Cardiovascular system: S1 & S2 heard, Rate controlled Gastrointestinal system: Abdomen is nondistended, soft and nontender. Normal bowel sounds heard.  Buttocks not examined today. Extremities: No cyanosis, clubbing, edema  Central nervous system: Alert and oriented. No focal neurological deficits. Moving extremities Psychiatry: Judgement and insight appear normal. Mood & affect appropriate.     Data Reviewed: I have personally reviewed following labs and imaging studies  CBC: Recent Labs  Lab 05/08/24 1416 05/09/24 0335  WBC 4.5 6.5  NEUTROABS 2.7  --   HGB 13.4 13.4  HCT 44.5 44.1  MCV 79.3* 79.6*  PLT 215 232   Basic Metabolic Panel: Recent Labs  Lab 05/08/24 1416 05/09/24 0335  NA 132* 135  K 4.1 4.0  CL 95* 98  CO2 25 29  GLUCOSE 239* 179*  BUN 24* 25*  CREATININE 1.24 1.26*  CALCIUM  9.2 9.3   GFR: Estimated Creatinine Clearance: 62.6 mL/min (A) (by C-G formula based on SCr of 1.26 mg/dL (H)). Liver Function Tests: Recent Labs  Lab 05/08/24 1416  AST 16  ALT 10  ALKPHOS 58  BILITOT 0.8  PROT 6.3*  ALBUMIN 2.8*   No results for input(s): LIPASE, AMYLASE in the last 168 hours. No results for input(s): AMMONIA in the last 168 hours. Coagulation Profile: No results for input(s): INR, PROTIME in the last 168 hours. Cardiac Enzymes: No results for input(s): CKTOTAL, CKMB, CKMBINDEX, TROPONINI in the last 168 hours. BNP (last 3 results) No results for input(s): PROBNP in the last 8760 hours. HbA1C: No results for  input(s): HGBA1C in the last 72 hours. CBG: Recent Labs  Lab 05/04/24 1455 05/04/24 1620 05/09/24 0803  GLUCAP 226* 173* 147*   Lipid Profile: No results for input(s): CHOL, HDL, LDLCALC, TRIG, CHOLHDL, LDLDIRECT in the last 72 hours. Thyroid  Function Tests: No results for input(s): TSH, T4TOTAL, FREET4, T3FREE, THYROIDAB in the last 72 hours. Anemia Panel: No results for input(s): VITAMINB12, FOLATE, FERRITIN, TIBC, IRON, RETICCTPCT in the last 72 hours. Sepsis Labs: Recent Labs  Lab 05/08/24 1416 05/08/24 1519  LATICACIDVEN 1.6 1.7    Recent Results (from the past 240 hours)  Culture, blood (routine x 2)     Status: None (Preliminary result)   Collection Time: 05/08/24  2:16 PM   Specimen: BLOOD  Result Value Ref Range Status   Specimen Description   Final    BLOOD BLOOD RIGHT ARM Performed at [facility], [address] [place], [place] [address]    Special Requests   Final    BOTTLES DRAWN AEROBIC AND ANAEROBIC Blood Culture results may not be optimal due to an inadequate volume of blood received in culture bottles Performed at [facility], [address], [place], [place] [address]    Culture   Final    NO GROWTH <12 HOURS Performed at [facility], [address] [place], [place] [address]    Report Status PENDING  Incomplete         Radiology Studies: CT PELVIS WO CONTRAST Result Date: 05/08/2024 CLINICAL DATA:  Provided history: Perianal abscess or fistula suspected Recent drainage of left buttock abscess.  Redness and swelling. EXAM: CT PELVIS WITHOUT CONTRAST TECHNIQUE: Multidetector CT imaging of the pelvis was performed following the standard protocol without intravenous contrast. RADIATION DOSE REDUCTION: This exam was performed according to the departmental dose-optimization program which includes automated exposure control, adjustment of the mA and/or kV according to patient size and/or use of iterative  reconstruction technique. COMPARISON:  CT 08/27/2018 FINDINGS: Urinary Tract:  No bladder wall thickening or inflammation. Bowel: Left perirectal/perianal soft tissue tract extending to the gluteal skin surface posteriorly, series 2, images 47 and 48. No drainable fluid collection. There is mild anorectal wall thickening. Moderate sigmoid diverticulosis without diverticulitis. No abnormal distension or inflammation of included bowel loops. Vascular/Lymphatic: Iliac atherosclerosis. No enlarged pelvic lymph nodes. Reproductive:  Prominent prostate. Other: Linear soft tissue track extending from the left anorectum posteriorly to the skin gluteal skin surface. This measures up to 6 mm in thickness. No free fluid in the pelvis. Fat in both inguinal canals. Adjacent fat stranding and inflammation. There is no drainable fluid collection. No soft tissue gas. 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 Chronic L5 pars defects with grade 1 anterolisthesis and degenerative change. No acute osseous findings. IMPRESSION: 1. Left perirectal/perianal soft tissue tract extending to the gluteal skin surface posteriorly. No drainable fluid collection. 2. Mild anorectal wall thickening. 3. Sigmoid diverticulosis without diverticulitis. 4. 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 Chronic  L5 pars defects with grade 1 anterolisthesis and degenerative change. Electronically Signed   By: Andrea Gasman M.D.   On: 05/08/2024 16:24        Scheduled Meds:  cholecalciferol   10,000 Units Oral Daily   DULoxetine   60 mg Oral Daily   ezetimibe   10 mg Oral Daily   fesoterodine   4 mg Oral Daily   gabapentin   300 mg Oral QHS   hydrochlorothiazide   25 mg Oral q morning   icosapent  Ethyl  2 g Oral BID   insulin  aspart protamine- aspart  40 Units Subcutaneous BID WC   losartan   25 mg Oral QHS   metoprolol  tartrate  25 mg Oral BID   pantoprazole   40 mg Oral QAC  breakfast   Continuous Infusions:  aztreonam  Stopped (05/09/24 0148)   metronidazole  Stopped (05/09/24 9340)          Derryl Duval, MD [facility] 05/09/2024, 11:16 AM

## 2024-05-09 NOTE — Plan of Care (Signed)

## 2024-05-10 DIAGNOSIS — L03317 Cellulitis of buttock: Secondary | ICD-10-CM | POA: Diagnosis not present

## 2024-05-10 MED ORDER — METRONIDAZOLE 500 MG PO TABS: 500.0000 mg | ORAL_TABLET | Freq: Three times a day (TID) | ORAL | 0 refills | Status: DC

## 2024-05-10 MED ORDER — CIPROFLOXACIN HCL 500 MG PO TABS: 500.0000 mg | ORAL_TABLET | Freq: Two times a day (BID) | ORAL | 0 refills | Status: AC

## 2024-05-10 NOTE — Discharge Summary (Signed)
 Physician Discharge Summary  TASHEEM ELMS FMW:[id number] DOB: 1961/03/18 DOA: 05/08/2024  PCP: [name], MD  Admit date: 05/08/2024 Discharge date: 05/10/2024  Admitted From: Home Disposition: Home  Recommendations for Outpatient Follow-up:  Follow up with PCP in 1 week  Follow up in ED if symptoms worsen or new appear Follow-up with colorectal surgery as an outpatient.   Home Health: No Equipment/Devices: None  Discharge Condition: Stable CODE STATUS: Full Diet recommendation: Heart healthy  Brief/Interim Summary: [name] is a 63 y.o. male with medical history significant of diabetes mellitus type 2, hypertension, polycythemia vera, CAD s/p CABG x4, chronic gastritis, neuropathy, chronic pancreatitis. Patient presented secondary to non-healing buttock infection. Patient reports that a few weeks ago, he was riding on the floor putting down vinyl flooring and pinched his left buttock, developing an abscess. He was seen on 9/12, had an abscess drained and discharged on doxycycline . His infection unfortunately continued to worsen and his PCP referred him to general surgery, who referred him to the emergency department. No fevers, chills, nausea, vomiting, diarrhea, constipation.   Patient was initiated on broad-spectrum antibiotics linezolid and Flagyl .  He is allergic to penicillin and sulfa.  Vancomycin was added as well.  He was evaluated by general surgery, fortunately no surgical treatment warranted based on imaging and exam findings.  He does not have any abscess on CT.  After overnight treatment with IV antibiotics, he started feeling better.  He is subsequently discharged with outpatient antibiotics.  He is prescribed ciprofloxacin  and Flagyl  for 10 days.  General surgery also recommended referral to colorectal surgery to evaluate for fistula.  His chronic conditions remained stable.  Discharge Diagnoses:  Principal Problem:   Cellulitis Active Problems:    Cellulitis of buttock, left    Discharge Instructions  Discharge Instructions     Ambulatory referral to Colorectal Surgery   Complete by: As directed    Diet - low sodium heart healthy   Complete by: As directed    Discharge instructions   Complete by: As directed    1.  Complete oral ciprofloxacin  and Flagyl  for 10 days after discharge 2.  Return to the ER if worsening pain, swelling, fevers, chills or drainage from the buttock. 3.  Follow-up with colorectal surgery as an outpatient.   Increase activity slowly   Complete by: As directed    No dressing needed   Complete by: As directed       Allergies as of 05/10/2024       Reactions   Iodinated Contrast Media Hives, Itching, Nausea Only   Flushing, Burning, Itching, Angioedema    Iohexol  Hives, Shortness Of Breath   Needs prep meds   Metoclopramide Hcl Hives   Patient became very spastic   Alfuzosin  Hcl Er Other (See Comments)   Dropped blood pressure   Sulfonamide Derivatives Hives   Large hives   Enalapril  Cough   Penicillins Rash   Immediate rash, facial/tongue/throat swelling, SOB or lightheadedness with hypotension   Sulfa Antibiotics Rash        Medication List     STOP taking these medications    doxycycline  100 MG capsule Commonly known as: VIBRAMYCIN        TAKE these medications    Cholecalciferol  125 MCG (5000 UT) Tabs Take 10,000 Units by mouth daily.   ciprofloxacin  500 MG tablet Commonly known as: Cipro  Take 1 tablet (500 mg total) by mouth 2 (two) times daily for 10 days.   clopidogrel  75 MG  tablet Commonly known as: PLAVIX  TAKE 1 TABLET BY MOUTH ONCE DAILY as a blood thinner   Comfort EZ Pen Needles 32G X 4 MM Misc Generic drug: Insulin  Pen Needle USE AS DIRECTED FOR injecting insulin    diazepam  10 MG tablet Commonly known as: VALIUM  Take 0.5 tablets (5 mg total) by mouth at bedtime as needed for anxiety or sleep. What changed: how much to take   DULoxetine  60 MG  capsule Commonly known as: CYMBALTA  TAKE ONE CAPSULE BY MOUTH DAILY   ezetimibe  10 MG tablet Commonly known as: ZETIA  TAKE 1 TABLET BY MOUTH DAILY   FreeStyle Libre 3 Plus Sensor Misc Change sensor every 15 days.   FreeStyle Libre 3 Reader [place] Use it to check blood glucose as instructed.   gabapentin  300 MG capsule Commonly known as: NEURONTIN  Take 1 capsule (300 mg total) by mouth 3 (three) times daily. What changed: when to take this   hydrochlorothiazide  25 MG tablet Commonly known as: HYDRODIURIL  TAKE 1 TABLET BY MOUTH IN THE MORNING   HYDROcodone -acetaminophen  10-325 MG tablet Commonly known as: NORCO Take 1 tablet by mouth every 6 (six) hours as needed for severe pain (pain score 7-10).   icosapent  Ethyl 1 g capsule Commonly known as: VASCEPA  TAKE TWO CAPSULES BY MOUTH TWICE DAILY   lansoprazole  30 MG capsule Commonly known as: PREVACID  Take 1 capsule (30 mg total) by mouth 2 (two) times daily before a meal. What changed: when to take this   losartan  50 MG tablet Commonly known as: COZAAR  Take 1 tablet (50 mg total) by mouth daily. What changed:  how much to take when to take this   metFORMIN  500 MG 24 hr tablet Commonly known as: GLUCOPHAGE -XR TAKE 1 TABLET BY MOUTH TWICE DAILY WITH A MEAL   methocarbamol  500 MG tablet Commonly known as: ROBAXIN  Take 1 tablet (500 mg total) by mouth every 8 (eight) hours as needed for muscle spasms.   metoprolol  tartrate 25 MG tablet Commonly known as: LOPRESSOR  TAKE 1 TABLET BY MOUTH TWICE DAILY   metroNIDAZOLE  500 MG tablet Commonly known as: FLAGYL  Take 1 tablet (500 mg total) by mouth 3 (three) times daily.   nitroGLYCERIN  0.4 MG SL tablet Commonly known as: NITROSTAT  Place 0.4 mg under the tongue every 5 (five) minutes as needed for chest pain. Reported on 02/26/2016   NovoLIN  70/30 Kwikpen (70-30) 100 UNIT/ML KwikPen Generic drug: insulin  isophane & regular human KwikPen Inject 60 Units into the skin 2  (two) times daily.   ondansetron  4 MG tablet Commonly known as: ZOFRAN  Take 1 tablet (4 mg total) by mouth every 8 (eight) hours as needed for nausea or vomiting.   polyethylene glycol 17 g packet Commonly known as: MIRALAX  / GLYCOLAX  Take 17 g by mouth daily as needed for moderate constipation.   PRESCRIPTION MEDICATION Take 1 tablet by mouth in the morning and at bedtime. Motillium (domperidone) 10 mg   solifenacin 10 MG tablet Commonly known as: VESICARE Take 10 mg by mouth daily.   tadalafil  5 MG tablet Commonly known as: CIALIS  Take 1 tablet (5 mg total) by mouth daily as needed for erectile dysfunction.               Discharge Care Instructions  (From admission, onward)           Start     Ordered   05/10/24 0000  No dressing needed        05/10/24 1133  Follow-up Information     [name], MD Follow up in 1 week(s).   Specialty: Internal Medicine Contact information: [address] [place] [address] [phone]                Allergies  Allergen Reactions   Iodinated Contrast Media Hives, Itching and Nausea Only    Flushing, Burning, Itching, Angioedema    Iohexol  Hives and Shortness Of Breath    Needs prep meds    Metoclopramide Hcl Hives    Patient became very spastic   Alfuzosin  Hcl Er Other (See Comments)    Dropped blood pressure   Sulfonamide Derivatives Hives    Large hives   Enalapril  Cough   Penicillins Rash    Immediate rash, facial/tongue/throat swelling, SOB or lightheadedness with hypotension   Sulfa Antibiotics Rash    Consultations:    Procedures/Studies: CT PELVIS WO CONTRAST Result Date: 05/08/2024 CLINICAL DATA:  Provided history: Perianal abscess or fistula suspected Recent drainage of left buttock abscess.  Redness and swelling. EXAM: CT PELVIS WITHOUT CONTRAST TECHNIQUE: Multidetector CT imaging of the pelvis was performed following the standard protocol without intravenous  contrast. RADIATION DOSE REDUCTION: This exam was performed according to the departmental dose-optimization program which includes automated exposure control, adjustment of the mA and/or kV according to patient size and/or use of iterative reconstruction technique. COMPARISON:  CT 08/27/2018 FINDINGS: Urinary Tract:  No bladder wall thickening or inflammation. Bowel: Left perirectal/perianal soft tissue tract extending to the gluteal skin surface posteriorly, series 2, images 47 and 48. No drainable fluid collection. There is mild anorectal wall thickening. Moderate sigmoid diverticulosis without diverticulitis. No abnormal distension or inflammation of included bowel loops. Vascular/Lymphatic: Iliac atherosclerosis. No enlarged pelvic lymph nodes. Reproductive:  Prominent prostate. Other: Linear soft tissue track extending from the left anorectum posteriorly to the skin gluteal skin surface. This measures up to 6 mm in thickness. No free fluid in the pelvis. Fat in both inguinal canals. Adjacent fat stranding and inflammation. There is no drainable fluid collection. No soft tissue gas. 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 Chronic L5 pars defects with grade 1 anterolisthesis and degenerative change. No acute osseous findings. IMPRESSION: 1. Left perirectal/perianal soft tissue tract extending to the gluteal skin surface posteriorly. No drainable fluid collection. 2. Mild anorectal wall thickening. 3. Sigmoid diverticulosis without diverticulitis. 4. 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 Chronic L5 pars defects with grade 1 anterolisthesis and degenerative change. Electronically Signed   By: Andrea Gasman M.D.   On: 05/08/2024 16:24      Subjective:   Discharge Exam: Vitals:   05/10/24 0649 05/10/24 0900  BP: 135/77 128/74  Pulse:  76  Resp:    Temp:    SpO2:  97%    General: Pt is alert, awake, not in acute  distress Cardiovascular: rate controlled, S1/S2 + Respiratory: bilateral decreased breath sounds at bases Abdominal: Soft, NT, ND, bowel sounds + Extremities: no edema, no cyanosis    The results of significant diagnostics from this hospitalization (including imaging, microbiology, ancillary and laboratory) are listed below for reference.     Microbiology: Recent Results (from the past 240 hours)  Culture, blood (routine x 2)     Status: None (Preliminary result)   Collection Time: 05/08/24  2:16 PM   Specimen: BLOOD  Result Value Ref Range Status   Specimen Description BLOOD BLOOD RIGHT ARM  Final   Special Requests  Final    BOTTLES DRAWN AEROBIC AND ANAEROBIC Blood Culture results may not be optimal due to an inadequate volume of blood received in culture bottles   Culture   Final    NO GROWTH 2 DAYS Performed at [facility], [address] [place], [place] [address]    Report Status PENDING  Incomplete  Culture, blood (routine x 2)     Status: None (Preliminary result)   Collection Time: 05/08/24  2:30 PM   Specimen: BLOOD  Result Value Ref Range Status   Specimen Description BLOOD BLOOD LEFT ARM  Final   Special Requests   Final    BOTTLES DRAWN AEROBIC AND ANAEROBIC Blood Culture adequate volume   Culture   Final    NO GROWTH 2 DAYS Performed at [facility], [address] [place], [place] [address]    Report Status PENDING  Incomplete     Labs: BNP (last 3 results) No results for input(s): BNP in the last 8760 hours. Basic Metabolic Panel: Recent Labs  Lab 05/08/24 1416 05/09/24 0335  NA 132* 135  K 4.1 4.0  CL 95* 98  CO2 25 29  GLUCOSE 239* 179*  BUN 24* 25*  CREATININE 1.24 1.26*  CALCIUM  9.2 9.3   Liver Function Tests: Recent Labs  Lab 05/08/24 1416  AST 16  ALT 10  ALKPHOS 58  BILITOT 0.8  PROT 6.3*  ALBUMIN 2.8*   No results for input(s): LIPASE, AMYLASE in the last 168 hours. No results for input(s): AMMONIA in the last 168  hours. CBC: Recent Labs  Lab 05/08/24 1416 05/09/24 0335  WBC 4.5 6.5  NEUTROABS 2.7  --   HGB 13.4 13.4  HCT 44.5 44.1  MCV 79.3* 79.6*  PLT 215 232   Cardiac Enzymes: No results for input(s): CKTOTAL, CKMB, CKMBINDEX, TROPONINI in the last 168 hours. BNP: Invalid input(s): POCBNP CBG: Recent Labs  Lab 05/04/24 1455 05/04/24 1620 05/09/24 0803 05/09/24 1201  GLUCAP 226* 173* 147* 210*   D-Dimer No results for input(s): DDIMER in the last 72 hours. Hgb A1c No results for input(s): HGBA1C in the last 72 hours. Lipid Profile No results for input(s): CHOL, HDL, LDLCALC, TRIG, CHOLHDL, LDLDIRECT in the last 72 hours. Thyroid  function studies No results for input(s): TSH, T4TOTAL, T3FREE, THYROIDAB in the last 72 hours.  Invalid input(s): FREET3 Anemia work up No results for input(s): VITAMINB12, FOLATE, FERRITIN, TIBC, IRON, RETICCTPCT in the last 72 hours. Urinalysis    Component Value Date/Time   COLORURINE YELLOW 09/29/2021 1532   APPEARANCEUR Clear 09/21/2023 1559   LABSPEC 1.016 09/29/2021 1532   PHURINE 5.0 09/29/2021 1532   GLUCOSEU Negative 09/21/2023 1559   HGBUR NEGATIVE 09/29/2021 1532   BILIRUBINUR Negative 09/21/2023 1559   KETONESUR NEGATIVE 09/29/2021 1532   PROTEINUR 1+ (A) 09/21/2023 1559   PROTEINUR NEGATIVE 09/29/2021 1532   UROBILINOGEN 0.2 03/28/2012 1042   NITRITE Negative 09/21/2023 1559   NITRITE NEGATIVE 09/29/2021 1532   LEUKOCYTESUR Negative 09/21/2023 1559   LEUKOCYTESUR NEGATIVE 09/29/2021 1532   Sepsis Labs Recent Labs  Lab 05/08/24 1416 05/09/24 0335  WBC 4.5 6.5   Microbiology Recent Results (from the past 240 hours)  Culture, blood (routine x 2)     Status: None (Preliminary result)   Collection Time: 05/08/24  2:16 PM   Specimen: BLOOD  Result Value Ref Range Status   Specimen Description BLOOD BLOOD RIGHT ARM  Final   Special Requests   Final    BOTTLES DRAWN  AEROBIC  AND ANAEROBIC Blood Culture results may not be optimal due to an inadequate volume of blood received in culture bottles   Culture   Final    NO GROWTH 2 DAYS Performed at [facility], [address] [place], [place] [address]    Report Status PENDING  Incomplete  Culture, blood (routine x 2)     Status: None (Preliminary result)   Collection Time: 05/08/24  2:30 PM   Specimen: BLOOD  Result Value Ref Range Status   Specimen Description BLOOD BLOOD LEFT ARM  Final   Special Requests   Final    BOTTLES DRAWN AEROBIC AND ANAEROBIC Blood Culture adequate volume   Culture   Final    NO GROWTH 2 DAYS Performed at [facility], [address], [place], [place] [address]    Report Status PENDING  Incomplete     Time coordinating discharge: 35 minutes  SIGNED:   Derryl Duval, MD  [facility] 05/10/2024, 11:57 AM

## 2024-05-10 NOTE — Progress Notes (Signed)
 [facility] Progress Note     Subjective: Patient seen and examined.  He is resting comfortably in bed.  He feels that the swelling has improved around his buttock/perineum.  He denies significant drainage.  He confirms pain still present.  Objective: Vital signs in last 24 hours: Temp:  [97.1 F (36.2 C)-98.5 F (36.9 C)] 97.6 F (36.4 C) (09/18 0413) Pulse Rate:  [64-78] 76 (09/18 0900) Resp:  [17-19] 18 (09/18 0413) BP: (117-160)/(65-89) 128/74 (09/18 0900) SpO2:  [97 %-100 %] 97 % (09/18 0900) Weight:  [72.7 kg] 72.7 kg (09/17 1053) Last BM Date : 05/10/24 (early this AM)  Intake/Output from previous day: 09/17 0701 - 09/18 0700 In: 391.9 [IV Piggyback:391.9] Out: -  Intake/Output this shift: Total I/O In: -  Out: 200 [Urine:200]  General appearance: alert, cooperative, and no distress Skin: Previous left I&D site noted with surrounding induration that spreads along the left buttock, left perirectal area, and left perineum, improved swelling, mild erythema, no evidence of fluctuance or palpable fluid collections  Lab Results:  Recent Labs    05/08/24 1416 05/09/24 0335  WBC 4.5 6.5  HGB 13.4 13.4  HCT 44.5 44.1  PLT 215 232   BMET Recent Labs    05/08/24 1416 05/09/24 0335  NA 132* 135  K 4.1 4.0  CL 95* 98  CO2 25 29  GLUCOSE 239* 179*  BUN 24* 25*  CREATININE 1.24 1.26*  CALCIUM  9.2 9.3   PT/INR No results for input(s): LABPROT, INR in the last 72 hours.  Studies/Results: CT PELVIS WO CONTRAST Result Date: 05/08/2024 CLINICAL DATA:  Provided history: Perianal abscess or fistula suspected Recent drainage of left buttock abscess.  Redness and swelling. EXAM: CT PELVIS WITHOUT CONTRAST TECHNIQUE: Multidetector CT imaging of the pelvis was performed following the standard protocol without intravenous contrast. RADIATION DOSE REDUCTION: This exam was performed according to the departmental dose-optimization program which includes  automated exposure control, adjustment of the mA and/or kV according to patient size and/or use of iterative reconstruction technique. COMPARISON:  CT 08/27/2018 FINDINGS: Urinary Tract:  No bladder wall thickening or inflammation. Bowel: Left perirectal/perianal soft tissue tract extending to the gluteal skin surface posteriorly, series 2, images 47 and 48. No drainable fluid collection. There is mild anorectal wall thickening. Moderate sigmoid diverticulosis without diverticulitis. No abnormal distension or inflammation of included bowel loops. Vascular/Lymphatic: Iliac atherosclerosis. No enlarged pelvic lymph nodes. Reproductive:  Prominent prostate. Other: Linear soft tissue track extending from the left anorectum posteriorly to the skin gluteal skin surface. This measures up to 6 mm in thickness. No free fluid in the pelvis. Fat in both inguinal canals. Adjacent fat stranding and inflammation. There is no drainable fluid collection. No soft tissue gas. 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 Chronic L5 pars defects with grade 1 anterolisthesis and degenerative change. No acute osseous findings. IMPRESSION: 1. Left perirectal/perianal soft tissue tract extending to the gluteal skin surface posteriorly. No drainable fluid collection. 2. Mild anorectal wall thickening. 3. Sigmoid diverticulosis without diverticulitis. 4. 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 Chronic L5 pars defects with grade 1 anterolisthesis and degenerative change. Electronically Signed   By: Andrea Gasman M.D.   On: 05/08/2024 16:24    Anti-infectives: Anti-infectives (From admission, onward)    Start     Dose/Rate Route Frequency Ordered Stop   05/09/24 0600  metroNIDAZOLE  (FLAGYL ) IVPB 500 mg        500 mg  100 mL/hr over 60 Minutes Intravenous Every 12 hours 05/08/24 1829     05/09/24 0000  aztreonam  (AZACTAM ) 2 g in sodium chloride  0.9 % 100 mL IVPB         2 g 200 mL/hr over 30 Minutes Intravenous Every 8 hours 05/08/24 1848     05/08/24 1515  aztreonam  (AZACTAM ) 2 g in sodium chloride  0.9 % 100 mL IVPB       Placed in And Linked Group   2 g 200 mL/hr over 30 Minutes Intravenous  Once 05/08/24 1511 05/08/24 1925   05/08/24 1515  metroNIDAZOLE  (FLAGYL ) IVPB 500 mg       Placed in And Linked Group   500 mg 100 mL/hr over 60 Minutes Intravenous  Once 05/08/24 1511 05/08/24 1630       Assessment/Plan:  Patient is a 63 year old male who was admitted with perineal cellulitis, recent history of I&D performed in the emergency department, and possible anal fistula.  -Discussed with the patient that he currently does not have any obvious fluid collections that would able to be drained at this time -Recommend conservative treatment for his perineal cellulitis and possible anal fistula with antibiotics -No plans for acute surgical intervention at this time -We further discussed that the cellulitis could organize and form a fluid collection that requires drainage in the future.  If he begins to have worsening pain, fever, swelling, or drainage, he should present to the emergency department for evaluation -Given that the patient has had some improvement in swelling and pain, I feel he is stable for discharge from surgical standpoint -Recommend that he be discharged home with broader spectrum antibiotics to cover gram-negative's and anaerobes -If patient requires no further intervention in the immediate acute period, recommend he follow-up with colorectal surgery for evaluation for possible anal fistula   LOS: 1 day    [name] 05/10/2024  Note: Portions of this report may have been transcribed using voice recognition software. Every effort has been made to ensure accuracy; however, inadvertent computerized transcription errors may still be present.

## 2024-05-10 NOTE — Plan of Care (Signed)
  Problem: Clinical Measurements: Goal: Ability to maintain clinical measurements within normal limits will improve Outcome: Progressing Goal: Will remain free from infection Outcome: Progressing Goal: Diagnostic test results will improve Outcome: Progressing Goal: Respiratory complications will improve Outcome: Progressing Goal: Cardiovascular complication will be avoided Outcome: Progressing   Problem: Activity: Goal: Risk for activity intolerance will decrease Outcome: Progressing   Problem: Coping: Goal: Level of anxiety will decrease Outcome: Progressing   Problem: Nutrition: Goal: Adequate nutrition will be maintained Outcome: Progressing

## 2024-05-13 LAB — CULTURE, BLOOD (ROUTINE X 2)
Culture: NO GROWTH
Culture: NO GROWTH
Special Requests: ADEQUATE

## 2024-05-15 ENCOUNTER — Telehealth: Payer: Self-pay

## 2024-05-15 NOTE — Telephone Encounter (Signed)
 Copied from CRM #[id number]. Topic: General - Other >> May 15, 2024  8:09 AM Donna BRAVO wrote: Reason for CRM: [name] [phone] Created by: [employer]   Patient declined to participate transition of care of abscess. Declined ALL care management form [employer] , not to mail out any care management information.

## 2024-05-16 ENCOUNTER — Encounter: Payer: Self-pay | Admitting: Internal Medicine

## 2024-05-22 ENCOUNTER — Institutional Professional Consult (permissible substitution) (HOSPITAL_BASED_OUTPATIENT_CLINIC_OR_DEPARTMENT_OTHER): Admitting: Internal Medicine

## 2024-05-27 DIAGNOSIS — E118 Type 2 diabetes mellitus with unspecified complications: Secondary | ICD-10-CM | POA: Insufficient documentation

## 2024-05-27 NOTE — Progress Notes (Deleted)
  Cardiology Office Note:   Date:  05/27/2024  ID:  [name] May 12, 1961, MRN [id number] PCP: [name], MD  [facility] Providers Cardiologist:  Lynwood Schilling, MD {  History of Present Illness:   [name] is a 63 y.o. male who presents for followup of his known coronary disease.  In 2016 he had chest pain but stress perfusion study demonstrated an EF of 60% with no ischemia.  He had an echo in Jan 2021.  He had a perfusion study in Jan 2023 . This was normal.   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    Since I last saw him ***   ***  He says he has been doing well.  He has been doing swimming and walking and biking at times.  If he does a lot of yard work he might get some chest discomfort.  He gets emotionally stressed he gets some chest discomfort.  However, he thinks this is the same or better than it was when he had his low risk stress perfusion study in 2023.  He has taken occasional nitroglycerin  but he says this is a reduced number compared to what he was taking.    ROS: ***  Studies Reviewed:    EKG:       ***  Risk Assessment/Calculations:   {Does this patient have ATRIAL FIBRILLATION?:[id number]} No BP recorded.  {Refresh Note OR Click here to enter BP  :1}***        Physical Exam:   VS:  There were no vitals taken for this visit.   Wt Readings from Last 3 Encounters:  05/09/24 160 lb 4.8 oz (72.7 kg)  05/04/24 174 lb (78.9 kg)  04/09/24 170 lb (77.1 kg)     GEN: Well nourished, well developed in no acute distress NECK: No JVD; No carotid bruits CARDIAC: ***RR, *** murmurs, rubs, gallops RESPIRATORY:  Clear to auscultation without rales, wheezing or rhonchi  ABDOMEN: Soft, non-tender, non-distended EXTREMITIES:  No edema; No deformity   ASSESSMENT AND PLAN:   CAD:  ***  The patient has no new sypmtoms.  No further cardiovascular testing is indicated.  We will continue with aggressive  risk reduction and meds as listed.   HTN:   The blood pressure is ***  controlled.  No change in therapy.     HYPERLIPIDEMIA:     He has not tolerated PCSK9 inhibitors or statins. ***    He has significant hypertriglyceridemia.  We could not get him bempedoic acid .  He is taking Zetia .  He said he has dramatically changed his diet and he has lost about 9 pounds.  I am going to go ahead and check a lipid profile again in about 2 months if his triglycerides are still high he would agree to see Dr. Mona.     DM: His last A1C was *** 7.7.  This down from 8.6.  Plan per his primary provider     Follow up ***  Signed, Lynwood Schilling, MD

## 2024-05-30 ENCOUNTER — Encounter: Payer: Self-pay | Admitting: Cardiology

## 2024-05-30 ENCOUNTER — Ambulatory Visit: Admitting: Urology

## 2024-05-30 ENCOUNTER — Ambulatory Visit: Admitting: Cardiology

## 2024-05-30 ENCOUNTER — Encounter: Payer: Self-pay | Admitting: Urology

## 2024-05-30 DIAGNOSIS — I2510 Atherosclerotic heart disease of native coronary artery without angina pectoris: Secondary | ICD-10-CM

## 2024-05-30 DIAGNOSIS — I10 Essential (primary) hypertension: Secondary | ICD-10-CM

## 2024-05-30 DIAGNOSIS — E785 Hyperlipidemia, unspecified: Secondary | ICD-10-CM

## 2024-05-30 DIAGNOSIS — E118 Type 2 diabetes mellitus with unspecified complications: Secondary | ICD-10-CM

## 2024-05-30 NOTE — Progress Notes (Signed)
 [name]                                          MRN: [id number]   05/30/2024   The [facility] Quality Team Specialist reviewed this patient medical record for the purposes of chart review for care gap closure. The following were reviewed: chart review for care gap closure-glycemic status assessment.    [facility] Quality Team

## 2024-05-31 ENCOUNTER — Encounter: Payer: Self-pay | Admitting: *Deleted

## 2024-06-03 ENCOUNTER — Other Ambulatory Visit: Payer: Self-pay | Admitting: Internal Medicine

## 2024-06-03 DIAGNOSIS — E1142 Type 2 diabetes mellitus with diabetic polyneuropathy: Secondary | ICD-10-CM

## 2024-06-03 DIAGNOSIS — F339 Major depressive disorder, recurrent, unspecified: Secondary | ICD-10-CM

## 2024-06-04 NOTE — Telephone Encounter (Signed)
 Patient has been r/s for 06/27/24.

## 2024-06-06 ENCOUNTER — Encounter (INDEPENDENT_AMBULATORY_CARE_PROVIDER_SITE_OTHER): Payer: Self-pay | Admitting: Gastroenterology

## 2024-06-08 ENCOUNTER — Other Ambulatory Visit: Payer: Self-pay | Admitting: Internal Medicine

## 2024-06-08 ENCOUNTER — Encounter: Payer: Self-pay | Admitting: Internal Medicine

## 2024-06-08 DIAGNOSIS — L03317 Cellulitis of buttock: Secondary | ICD-10-CM

## 2024-06-08 DIAGNOSIS — K603 Anal fistula, unspecified: Secondary | ICD-10-CM

## 2024-06-08 DIAGNOSIS — K611 Rectal abscess: Secondary | ICD-10-CM

## 2024-06-08 MED ORDER — CIPROFLOXACIN HCL 500 MG PO TABS: 500.0000 mg | ORAL_TABLET | Freq: Two times a day (BID) | ORAL | 0 refills | Status: AC

## 2024-06-12 ENCOUNTER — Other Ambulatory Visit: Payer: Self-pay | Admitting: Cardiology

## 2024-06-12 DIAGNOSIS — E785 Hyperlipidemia, unspecified: Secondary | ICD-10-CM

## 2024-06-13 ENCOUNTER — Other Ambulatory Visit: Payer: Self-pay | Admitting: Internal Medicine

## 2024-06-13 DIAGNOSIS — E782 Mixed hyperlipidemia: Secondary | ICD-10-CM

## 2024-06-18 ENCOUNTER — Institutional Professional Consult (permissible substitution) (HOSPITAL_BASED_OUTPATIENT_CLINIC_OR_DEPARTMENT_OTHER): Admitting: Internal Medicine

## 2024-06-24 NOTE — Progress Notes (Deleted)
  Cardiology Office Note:   Date:  06/24/2024  ID:  Rocklin, Soderquist Apr 27, 1961, MRN [id number] PCP: [name], MD  [facility] Providers Cardiologist:  Lynwood Schilling, MD {  History of Present Illness:   [name] is a 64 y.o. male who presents for followup of his known coronary disease.  In 2016 he had chest pain but stress perfusion study demonstrated an EF of 60% with no ischemia.  He had an echo in Jan 2021.  He had a perfusion study IN jAN 2023 This was normal.   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  Although he does have a low iron diet and currently is not needing this..     Since he was last seen ***   ***   He says he has been doing well.  He has been doing swimming and walking and biking at times.  If he does a lot of yard work he might get some chest discomfort.  He gets emotionally stressed he gets some chest discomfort.  However, he thinks this is the same or better than it was when he had his low risk stress perfusion study in 2023.  He has taken occasional nitroglycerin  but he says this is a reduced number compared to what he was taking.  ROS: ***  Studies Reviewed:    EKG:       ***  Risk Assessment/Calculations:   {Does this patient have ATRIAL FIBRILLATION?:[phone]} No BP recorded.  {Refresh Note OR Click here to enter BP  :1}***        Physical Exam:   VS:  There were no vitals taken for this visit.   Wt Readings from Last 3 Encounters:  05/09/24 160 lb 4.8 oz (72.7 kg)  05/04/24 174 lb (78.9 kg)  04/09/24 170 lb (77.1 kg)     GEN: Well nourished, well developed in no acute distress NECK: No JVD; No carotid bruits CARDIAC: ***RR, *** murmurs, rubs, gallops RESPIRATORY:  Clear to auscultation without rales, wheezing or rhonchi  ABDOMEN: Soft, non-tender, non-distended EXTREMITIES:  No edema; No deformity   ASSESSMENT AND PLAN:   CAD:  ***  The patient has no new sypmtoms.  No  further cardiovascular testing is indicated.  We will continue with aggressive risk reduction and meds as listed.   HTN:   The blood pressure is ***   controlled.  No change in therapy.     HYPERLIPIDEMIA:    ***   He has not tolerated PCSK9 inhibitors or statins.  He has significant hypertriglyceridemia.  We could not get him bempedoic acid .  He is taking Zetia .  He said he has dramatically changed his diet and he has lost about 9 pounds.  I am going to go ahead and check a lipid profile again in about 2 months if his triglycerides are still high he would agree to see Dr. Mona.     DM: His last A1C was ***  7.7.  This down from 8.6.  Plan per his primary provider       Follow up ***  Signed, Lynwood Schilling, MD

## 2024-06-26 ENCOUNTER — Telehealth: Payer: Self-pay

## 2024-06-26 NOTE — Telephone Encounter (Signed)
Left voice message to call back regarding upcoming appointment

## 2024-06-26 NOTE — Telephone Encounter (Signed)
-----   Message from Lynwood Schilling sent at 06/25/2024  9:00 PM EST ----- Please call.  He has no showed for many appts recently.  I will need to discharge from my practice if he no shows again.  Thanks.

## 2024-06-27 ENCOUNTER — Ambulatory Visit: Admitting: Cardiology

## 2024-06-27 DIAGNOSIS — I2510 Atherosclerotic heart disease of native coronary artery without angina pectoris: Secondary | ICD-10-CM

## 2024-06-27 DIAGNOSIS — E118 Type 2 diabetes mellitus with unspecified complications: Secondary | ICD-10-CM

## 2024-06-27 DIAGNOSIS — I10 Essential (primary) hypertension: Secondary | ICD-10-CM

## 2024-06-27 DIAGNOSIS — E785 Hyperlipidemia, unspecified: Secondary | ICD-10-CM

## 2024-07-17 ENCOUNTER — Other Ambulatory Visit: Payer: Self-pay | Admitting: Internal Medicine

## 2024-07-17 DIAGNOSIS — E1140 Type 2 diabetes mellitus with diabetic neuropathy, unspecified: Secondary | ICD-10-CM

## 2024-08-06 ENCOUNTER — Other Ambulatory Visit: Payer: Self-pay | Admitting: Internal Medicine

## 2024-08-06 DIAGNOSIS — E1142 Type 2 diabetes mellitus with diabetic polyneuropathy: Secondary | ICD-10-CM

## 2024-08-09 ENCOUNTER — Ambulatory Visit: Admitting: Internal Medicine

## 2024-08-09 ENCOUNTER — Encounter: Payer: Self-pay | Admitting: Internal Medicine

## 2024-08-09 VITALS — BP 136/88 | HR 102 | Resp 20 | Ht 69.5 in | Wt 168.4 lb

## 2024-08-09 DIAGNOSIS — R079 Chest pain, unspecified: Secondary | ICD-10-CM | POA: Insufficient documentation

## 2024-08-09 DIAGNOSIS — N1831 Chronic kidney disease, stage 3a: Secondary | ICD-10-CM

## 2024-08-09 DIAGNOSIS — J209 Acute bronchitis, unspecified: Secondary | ICD-10-CM | POA: Insufficient documentation

## 2024-08-09 DIAGNOSIS — I25118 Atherosclerotic heart disease of native coronary artery with other forms of angina pectoris: Secondary | ICD-10-CM | POA: Diagnosis not present

## 2024-08-09 DIAGNOSIS — D45 Polycythemia vera: Secondary | ICD-10-CM

## 2024-08-09 DIAGNOSIS — I10 Essential (primary) hypertension: Secondary | ICD-10-CM

## 2024-08-09 DIAGNOSIS — E1140 Type 2 diabetes mellitus with diabetic neuropathy, unspecified: Secondary | ICD-10-CM | POA: Diagnosis not present

## 2024-08-09 DIAGNOSIS — Z794 Long term (current) use of insulin: Secondary | ICD-10-CM | POA: Diagnosis not present

## 2024-08-09 MED ORDER — METHYLPREDNISOLONE 4 MG PO TBPK: ORAL_TABLET | ORAL | 0 refills | Status: DC

## 2024-08-09 NOTE — Progress Notes (Signed)
 Established Patient Office Visit  Subjective:  Patient ID: [name], male    DOB: 1961/08/10  Age: 63 y.o. MRN: [id number]  CC:  Chief Complaint  Patient presents with   Diabetes    4 follow up    Chest Pain    Pt complains of chest pain with deep breath and cough. Complains of mild SOB with exertion x1 week     HPI [name] is a 63 y.o. male with past medical history of CAD status post CABG, hypertension, uncontrolled diabetes mellitus-on insulin , severe diabetic neuropathy, chronic pancreatitis, polycythemia vera, hyperlipidemia, cervical spinal stenosis and chronic back pain who presents for f/u of his chronic medical conditions.  He reports intermittent dyspnea and chest tightness for the last 1 week, lasting for few minutes.  He feels chest pain with deep breaths.  He also reports nasal congestion and sinus pressure related headache recently.  Of note, he has history of chronic sinusitis.  Denies any palpitations or left-sided arm heaviness.  He states that it is different than his anginal chest pain.  Denies any fever or chills recently.  HTN: BP is wnl today. Takes medications regularly. Patient denies headache, dizziness or palpitations currently.   DM: HbA1C was 9.4 in 08/25. He used to see Dr Lenis, but has stopped seeing him now. His insulin  dose was adjusted to 60 U QAM and 48 U QPM in the last visit.  He reports that he still takes lesser dose in the evening at times if blood glucose is close to 150.  He also takes Metformin  500 mg twice daily. Denies any polyuria or polyphagia currently.  He has CGM device, and his blood glucose runs around 150-200 most of the time, except post-meal readings.  Diabetic neuropathy: Takes gabapentin  and as needed Norco for severe pain.  He has tried gabapentin  cream as well for neuropathy. He continues to have severe burning pain in his feet, but Cymbalta  has helped somewhat now.  MDD: He takes Cymbalta  for it.  Currently denies  any apathy, SI or HI currently.  He was switched to Cymbalta  from Paxil  for better neuropathic pain coverage.  He reports chronic insomnia, which he attributes to neuropathic pain.  He also reports chronic tinnitus.  Denies any ear pain or discharge.  He has hearing aid, and he reports that it helps with tinnitus at times when he uses hearing aid.  He reports chronic bilateral shoulder pain, has a history of OA of shoulder and bursitis as well.  Followed by orthopedic surgeon.  He also has h/o cervical spinal stenosis, currently takes gabapentin  for it.    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  Patent coronary bypass grafts with a free radial graft to PDA and LIMA to the  diag.   Diabetes mellitus    Diabetic peripheral neuropathy (HCC) 05/06/2020   Dyslipidemia    Poorly controlled, probably related to his DM (Some of this is secondary to his inability to afford medications).   ED (erectile dysfunction)    Esophageal yeast infection (HCC)    Gastroparesis    GERD (gastroesophageal reflux disease) 2001   Hyperlipidemia 1990   Hypertension  2001   Metaplasia of esophagus    Pancreatitis chronic    Pseudocyst   Polycythemia vera(238.4) 04/26/2012   PONV (postoperative nausea and vomiting)    Sleep apnea    No longer use sleep apnea machine    Past Surgical History:  Procedure Laterality Date   APPENDECTOMY  08/1978   1980   BIOPSY  11/11/2021   Procedure: BIOPSY;  Surgeon: Golda Claudis PENNER, MD;  [facility];  Service: Endoscopy;;   CARDIAC CATHETERIZATION     CHOLECYSTECTOMY  09/1998   COLONOSCOPY  07/31/1985   COLONOSCOPY N/A 03/11/2016   Procedure: COLONOSCOPY;  Surgeon: Claudis PENNER Golda, MD;  Location: [facility];  Service:  Endoscopy;  Laterality: N/A;   COLONOSCOPY WITH PROPOFOL  N/A 11/11/2021   Procedure: COLONOSCOPY WITH PROPOFOL ;  Surgeon: Golda Claudis PENNER, MD;  [facility]: [facility];  Service: Endoscopy;  Laterality: N/A;  205   CORONARY ANGIOPLASTY WITH STENT PLACEMENT  08/23/2004   CORONARY ARTERY BYPASS GRAFT  11/24/2004   ESOPHAGOGASTRODUODENOSCOPY  09/19/2001   ESOPHAGOGASTRODUODENOSCOPY N/A 03/11/2016   Procedure: ESOPHAGOGASTRODUODENOSCOPY (EGD);  Surgeon: Claudis PENNER Golda, MD;  [facility];  Service: Endoscopy;  Laterality: N/A;  12:00   ESOPHAGOGASTRODUODENOSCOPY (EGD) WITH PROPOFOL  N/A 11/11/2021   Procedure: ESOPHAGOGASTRODUODENOSCOPY (EGD) WITH PROPOFOL ;  Surgeon: Golda Claudis PENNER, MD;  [facility];  Service: Endoscopy;  Laterality: N/A;   ESOPHAGOGASTRODUODENOSCOPY (EGD) WITH PROPOFOL  N/A 10/25/2023   Procedure: ESOPHAGOGASTRODUODENOSCOPY (EGD) WITH PROPOFOL ;  Surgeon: Eartha Angelia Sieving, MD;  [facility]: [facility];  Service: Gastroenterology;  Laterality: N/A;  10:45am;asa 3   FINGER TENDON REPAIR Bilateral    in middle finger   KNEE SURGERY     left knee   NASAL SEPTUM SURGERY     POLYPECTOMY  11/11/2021   Procedure: POLYPECTOMY;  Surgeon: Golda Claudis PENNER, MD;  [facility]: [facility];  Service: Endoscopy;;    Family History  Problem Relation Age of Onset   Coronary artery disease Mother        strong family history of   Heart attack Mother        dying of (at age 109)   Diabetes Mother    Early death Mother    Heart disease Mother    Diabetes Sister    Hypertension Sister    Diabetes Brother    Pancreatitis Brother    Early death Brother    Heart disease Brother    Hypertension Brother    Hyperlipidemia Daughter    Hypertension Daughter    Kidney disease Daughter    Heart disease Brother    Cancer Brother    Hypertension Brother    Hyperlipidemia Brother    Early death Brother    Heart disease Brother     Social History    Socioeconomic History   Marital status: Married    Spouse name: Not on file   Number of children: Not on file   Years of education: Not on file   Highest education level: Not on file  Occupational History   Not on file  Tobacco Use   Smoking status: Never   Smokeless tobacco: Never  Vaping Use   Vaping status: Never Used  Substance and Sexual Activity   Alcohol use: Not Currently   Drug use: No   Sexual activity: Not on file  Other Topics Concern   Not on file  Social History Narrative   The patient lives in [place] with his wife.  He  is     disabled secondary to coronary artery disease and pancreatitis.  He does     not smoke, he does not drink alcohol.  There is no drug use.    Social Drivers of Health   Tobacco Use: Low Risk (08/09/2024)   Patient History    Smoking Tobacco Use: Never    Smokeless Tobacco Use: Never    Passive Exposure: Not on file  Financial Resource Strain: Low Risk (11/29/2023)   Overall Financial Resource Strain (CARDIA)    Difficulty of Paying Living Expenses: Not hard at all  Food Insecurity: No Food Insecurity (05/09/2024)   Epic    Worried About Programme Researcher, Broadcasting/film/video in the Last Year: Never true    Ran Out of Food in the Last Year: Never true  Transportation Needs: No Transportation Needs (05/09/2024)   Epic    Lack of Transportation (Medical): No    Lack of Transportation (Non-Medical): No  Physical Activity: Inactive (11/29/2023)   Exercise Vital Sign    Days of Exercise per Week: 0 days    Minutes of Exercise per Session: 0 min  Stress: No Stress Concern Present (11/29/2023)   [employer] of Occupational Health - Occupational Stress Questionnaire    Feeling of Stress : Only a little  Social Connections: Moderately Integrated (11/29/2023)   Social Connection and Isolation Panel    Frequency of Communication with Friends and Family: More than three times a week    Frequency of Social Gatherings with Friends and Family: More than three  times a week    Attends Religious Services: More than 4 times per year    Active Member of [employer] or Organizations: No    Attends Banker Meetings: Never    Marital Status: Married  Catering Manager Violence: Not At Risk (05/09/2024)   Epic    Fear of Current or Ex-Partner: No    Emotionally Abused: No    Physically Abused: No    Sexually Abused: No  Depression (PHQ2-9): Low Risk (04/09/2024)   Depression (PHQ2-9)    PHQ-2 Score: 0  Alcohol Screen: Low Risk (11/29/2023)   Alcohol Screen    Last Alcohol Screening Score (AUDIT): 0  Housing: Low Risk (05/09/2024)   Epic    Unable to Pay for Housing in the Last Year: No    Number of Times Moved in the Last Year: 0    Homeless in the Last Year: No  Utilities: Not At Risk (05/09/2024)   Epic    Threatened with loss of utilities: No  Health Literacy: Adequate Health Literacy (11/29/2023)   B1300 Health Literacy    Frequency of need for help with medical instructions: Never    Outpatient Medications Prior to Visit  Medication Sig Dispense Refill   Cholecalciferol  125 MCG (5000 UT) TABS Take 10,000 Units by mouth daily.     clopidogrel  (PLAVIX ) 75 MG tablet TAKE 1 TABLET BY MOUTH ONCE DAILY as a blood thinner 180 tablet 2   COMFORT EZ PEN NEEDLES 32G X 4 MM MISC USE AS DIRECTED FOR injecting insulin  100 each 3   Continuous Glucose Receiver (FREESTYLE LIBRE 3 READER) DEVI Use it to check blood glucose as instructed. 1 each 0   Continuous Glucose Sensor (FREESTYLE LIBRE 3 PLUS SENSOR) MISC Change sensor every 15 days. 6 each 3   diazepam  (VALIUM ) 10 MG tablet Take 0.5 tablets (5 mg total) by mouth at bedtime as needed for anxiety or sleep. (Patient taking differently: Take 5-15  mg by mouth at bedtime as needed for anxiety or sleep.) 30 tablet 0   DULoxetine  (CYMBALTA ) 60 MG capsule TAKE ONE CAPSULE BY MOUTH DAILY 90 capsule 1   ezetimibe  (ZETIA ) 10 MG tablet TAKE 1 TABLET BY MOUTH DAILY 90 tablet 0   gabapentin  (NEURONTIN ) 300 MG  capsule Take 1 capsule (300 mg total) by mouth 3 (three) times daily. (Patient taking differently: Take 300 mg by mouth at bedtime.) 270 capsule 1   hydrochlorothiazide  (HYDRODIURIL ) 25 MG tablet TAKE 1 TABLET BY MOUTH IN THE MORNING 90 tablet 1   HYDROcodone -acetaminophen  (NORCO) 10-325 MG tablet TAKE 1 TABLET BY MOUTH EVERY 6 HOURS AS NEEDED FOR SEVERE PAIN 120 tablet 0   icosapent  Ethyl (VASCEPA ) 1 g capsule TAKE TWO CAPSULES BY MOUTH TWICE DAILY 360 capsule 0   insulin  isophane & regular human KwikPen (NOVOLIN  70/30 KWIKPEN) (70-30) 100 UNIT/ML KwikPen Inject 60 Units into the skin 2 (two) times daily. 45 mL 5   lansoprazole  (PREVACID ) 30 MG capsule Take 1 capsule (30 mg total) by mouth 2 (two) times daily before a meal. (Patient taking differently: Take 30 mg by mouth daily before breakfast.) 180 capsule 1   losartan  (COZAAR ) 50 MG tablet Take 1 tablet (50 mg total) by mouth daily. (Patient taking differently: Take 25 mg by mouth at bedtime.) 90 tablet 3   metFORMIN  (GLUCOPHAGE -XR) 500 MG 24 hr tablet TAKE 1 TABLET BY MOUTH TWICE DAILY WITH A MEAL 180 tablet 1   methocarbamol  (ROBAXIN ) 500 MG tablet Take 1 tablet (500 mg total) by mouth every 8 (eight) hours as needed for muscle spasms. 30 tablet 0   metoprolol  tartrate (LOPRESSOR ) 25 MG tablet TAKE 1 TABLET BY MOUTH TWICE DAILY 180 tablet 1   nitroGLYCERIN  (NITROSTAT ) 0.4 MG SL tablet Place 0.4 mg under the tongue every 5 (five) minutes as needed for chest pain. Reported on 02/26/2016     ondansetron  (ZOFRAN ) 4 MG tablet Take 1 tablet (4 mg total) by mouth every 8 (eight) hours as needed for nausea or vomiting. 30 tablet 0   polyethylene glycol (MIRALAX  / GLYCOLAX ) 17 g packet Take 17 g by mouth daily as needed for moderate constipation.     PRESCRIPTION MEDICATION Take 1 tablet by mouth in the morning and at bedtime. Motillium (domperidone) 10 mg     solifenacin (VESICARE) 10 MG tablet Take 10 mg by mouth daily.     tadalafil  (CIALIS ) 5 MG tablet  Take 1 tablet (5 mg total) by mouth daily as needed for erectile dysfunction. 30 tablet 11   metroNIDAZOLE  (FLAGYL ) 500 MG tablet Take 1 tablet (500 mg total) by mouth 3 (three) times daily. 30 tablet 0   No facility-administered medications prior to visit.    Allergies  Allergen Reactions   Iodinated Contrast Media Hives, Itching and Nausea Only    Flushing, Burning, Itching, Angioedema    Iohexol  Hives and Shortness Of Breath    Needs prep meds    Metoclopramide Hcl Hives    Patient became very spastic   Alfuzosin  Hcl Er Other (See Comments)    Dropped blood pressure   Sulfonamide Derivatives Hives    Large hives   Enalapril  Cough   Penicillins Rash    Immediate rash, facial/tongue/throat swelling, SOB or lightheadedness with hypotension   Sulfa Antibiotics Rash    ROS Review of Systems  Constitutional:  Negative for chills and fever.  HENT:  Positive for congestion and tinnitus.        Hearing  difficulty  Eyes:  Negative for pain and discharge.  Respiratory:  Positive for cough, chest tightness and shortness of breath.   Cardiovascular:  Negative for palpitations.  Gastrointestinal:  Negative for diarrhea, nausea and vomiting.  Endocrine: Negative for polydipsia and polyuria.  Genitourinary:  Positive for difficulty urinating. Negative for hematuria.  Musculoskeletal:  Positive for arthralgias, back pain and neck pain. Negative for neck stiffness.  Skin:  Negative for rash.  Neurological:  Positive for numbness (B/l LE, intermittent). Negative for dizziness, weakness and headaches.  Psychiatric/Behavioral:  Negative for agitation and behavioral problems.       Objective:    Physical Exam Vitals reviewed.  Constitutional:      General: He is not in acute distress.    Appearance: He is not diaphoretic.  HENT:     Head: Normocephalic and atraumatic.     Nose: Congestion present.     Mouth/Throat:     Mouth: Mucous membranes are moist.  Eyes:     General: No  scleral icterus.    Extraocular Movements: Extraocular movements intact.  Cardiovascular:     Rate and Rhythm: Normal rate and regular rhythm.     Heart sounds: Normal heart sounds. No murmur heard. Pulmonary:     Breath sounds: Wheezing (Mild, bilaterally) present. No rales.  Musculoskeletal:     Cervical back: Neck supple. No tenderness.     Right lower leg: No edema.     Left lower leg: No edema.  Skin:    General: Skin is warm.     Findings: No rash.  Neurological:     General: No focal deficit present.     Mental Status: He is alert and oriented to person, place, and time.     Sensory: Sensory deficit (B/l feet) present.     Motor: No weakness.  Psychiatric:        Mood and Affect: Mood normal.        Behavior: Behavior normal.     BP 136/88 (BP Location: Left Arm)   Pulse (!) 102   Resp 20   Ht 5' 9.5 (1.765 m)   Wt 168 lb 6.4 oz (76.4 kg)   SpO2 98%   BMI 24.51 kg/m  Wt Readings from Last 3 Encounters:  08/09/24 168 lb 6.4 oz (76.4 kg)  05/09/24 160 lb 4.8 oz (72.7 kg)  05/04/24 174 lb (78.9 kg)    Lab Results  Component Value Date   TSH 3.110 12/30/2022   Lab Results  Component Value Date   WBC 6.5 05/09/2024   HGB 13.4 05/09/2024   HCT 44.1 05/09/2024   MCV 79.6 (L) 05/09/2024   PLT 232 05/09/2024   Lab Results  Component Value Date   NA 135 05/09/2024   K 4.0 05/09/2024   CO2 29 05/09/2024   GLUCOSE 179 (H) 05/09/2024   BUN 25 (H) 05/09/2024   CREATININE 1.26 (H) 05/09/2024   BILITOT 0.8 05/08/2024   ALKPHOS 58 05/08/2024   AST 16 05/08/2024   ALT 10 05/08/2024   PROT 6.3 (L) 05/08/2024   ALBUMIN 2.8 (L) 05/08/2024   CALCIUM  9.3 05/09/2024   ANIONGAP 8 05/09/2024   EGFR 58 (L) 04/09/2024   Lab Results  Component Value Date   CHOL 299 (H) 04/09/2024   Lab Results  Component Value Date   HDL 34 (L) 04/09/2024   Lab Results  Component Value Date   LDLCALC Comment (A) 04/09/2024   Lab Results  Component Value Date  TRIG 904  (HH) 04/09/2024   Lab Results  Component Value Date   CHOLHDL 8.8 (H) 04/09/2024   Lab Results  Component Value Date   HGBA1C 9.4 (H) 04/09/2024      Assessment & Plan:   Problem List Items Addressed This Visit       Cardiovascular and Mediastinum   Essential hypertension, benign   BP Readings from Last 1 Encounters:  05/10/24 128/74   Usually well-controlled with Metoprolol , Losartan  and HCTZ Counseled for compliance with the medications Advised DASH diet and moderate exercise/walking, at least 150 mins/week      Relevant Orders   CBC with Differential/Platelet   CMP14+EGFR   Coronary artery disease of native artery of native heart with stable angina pectoris - Primary   On Plavix  and B-blocker Nitroglycerin  PRN for chest pain Follows up with Cardiologist  EKG today: Sinus rhythm.  No signs of active ischemia.  Aberrant changes due to difficulty with lead replacement.      Relevant Medications   methylPREDNISolone  (MEDROL  DOSEPAK) 4 MG TBPK tablet   Other Relevant Orders   B Nat Peptide     Respiratory   Acute bronchitis   Pleuritic chest pain and dyspnea likely due to acute bronchitis Started Medrol  Dosepak Albuterol as needed for dyspnea or wheezing Nasal saline spray as needed for nasal congestion Uses Navage for nasal rinse      Relevant Medications   methylPREDNISolone  (MEDROL  DOSEPAK) 4 MG TBPK tablet   albuterol (VENTOLIN HFA) 108 (90 Base) MCG/ACT inhaler     Endocrine   Type 2 diabetes mellitus with diabetic neuropathy, with long-term current use of insulin  (HCC)   Lab Results  Component Value Date   HGBA1C 9.4 (H) 04/09/2024   Uncontrolled, check HbA1c On Novolin  70/30 - 60 U QAM and 48 U QHS, and Metformin  500 mg BID, used to f/u with Dr Lenis Needs to take Novolin  regularly - switched to pen form, advised to take 48 Units at evening if blood glucose between 100-150 instead of taking random dose If persistent hyperglycemia, will need to  switch to Tresiba or Toujeo with ISS Advised to follow diabetic diet -has joined diabetes reversal group Not on any statin due to statin intolerance Diabetic eye exam: Advised to follow up with Ophthalmology for diabetic eye exam      Relevant Orders   CMP14+EGFR   Hemoglobin A1c     Genitourinary   Stage 3a chronic kidney disease (HCC)   Last BMP reviewed, usually GFR around 50 Advised to maintain adequate hydration On losartan  Avoid nephrotoxic agents        Other   Polycythemia vera (HCC)   JAK2 negative Gets venipuncture every 3-4 months F/u Heme/Onc. Check CBC His Hb has been stable recently without venipuncture, denies any recent signs of bleeding      Relevant Medications   methylPREDNISolone  (MEDROL  DOSEPAK) 4 MG TBPK tablet   Chest pain   Likely pleuritic chest pain Will treat for acute bronchitis EKG today showed sinus rhythm, no signs of active ischemia.  Aberrant changes due to lead placement.      Relevant Orders   EKG 12-Lead (Completed)       Meds ordered this encounter  Medications   methylPREDNISolone  (MEDROL  DOSEPAK) 4 MG TBPK tablet    Sig: Take as package instructions.    Dispense:  1 each    Refill:  0   albuterol (VENTOLIN HFA) 108 (90 Base) MCG/ACT inhaler    Sig: Inhale 2  puffs into the lungs every 6 (six) hours as needed for wheezing or shortness of breath.    Dispense:  18 g    Refill:  0    Okay to substitute to generic/formulary Albuterol.    Follow-up: Return in about 3 months (around 11/07/2024).    Suzzane MARLA Blanch, MD

## 2024-08-09 NOTE — Patient Instructions (Signed)
 Please start taking Prednisone  as prescribed.  Please use Albuterol as needed for shortness of breath.  Please get chest x-ray done at [facility].

## 2024-08-09 NOTE — Assessment & Plan Note (Signed)
 BP Readings from Last 1 Encounters:  05/10/24 128/74   Usually well-controlled with Metoprolol , Losartan  and HCTZ Counseled for compliance with the medications Advised DASH diet and moderate exercise/walking, at least 150 mins/week

## 2024-08-09 NOTE — Assessment & Plan Note (Addendum)
 Last BMP reviewed, usually GFR around 50 Advised to maintain adequate hydration On losartan  Avoid nephrotoxic agents

## 2024-08-09 NOTE — Assessment & Plan Note (Addendum)
 Lab Results  Component Value Date   HGBA1C 9.4 (H) 04/09/2024   Uncontrolled, check HbA1c On Novolin  70/30 - 60 U QAM and 48 U QHS, and Metformin  500 mg BID, used to f/u with Dr Lenis Needs to take Novolin  regularly - switched to pen form, advised to take 48 Units at evening if blood glucose between 100-150 instead of taking random dose If persistent hyperglycemia, will need to switch to Tresiba or Toujeo with ISS Advised to follow diabetic diet -has joined diabetes reversal group Not on any statin due to statin intolerance Diabetic eye exam: Advised to follow up with Ophthalmology for diabetic eye exam

## 2024-08-09 NOTE — Assessment & Plan Note (Addendum)
 On Plavix  and B-blocker Nitroglycerin  PRN for chest pain Follows up with Cardiologist  EKG today: Sinus rhythm.  No signs of active ischemia.  Aberrant changes due to difficulty with lead replacement.

## 2024-08-09 NOTE — Assessment & Plan Note (Signed)
 Likely pleuritic chest pain Will treat for acute bronchitis EKG today showed sinus rhythm, no signs of active ischemia.  Aberrant changes due to lead placement.

## 2024-08-09 NOTE — Assessment & Plan Note (Signed)
 JAK2 negative Gets venipuncture every 3-4 months F/u Heme/Onc. Check CBC His Hb has been stable recently without venipuncture, denies any recent signs of bleeding

## 2024-08-09 NOTE — Assessment & Plan Note (Signed)
 Pleuritic chest pain and dyspnea likely due to acute bronchitis Started Medrol  Dosepak Albuterol as needed for dyspnea or wheezing Nasal saline spray as needed for nasal congestion Uses Navage for nasal rinse

## 2024-08-10 ENCOUNTER — Ambulatory Visit: Payer: Self-pay | Admitting: Internal Medicine

## 2024-08-16 LAB — HEMOGLOBIN A1C
Est. average glucose Bld gHb Est-mCnc: 223 mg/dL
Hgb A1c MFr Bld: 9.4 % — ABNORMAL HIGH (ref 4.8–5.6)

## 2024-08-16 LAB — CBC WITH DIFFERENTIAL/PLATELET
Basophils Absolute: 0 x10E3/uL (ref 0.0–0.2)
Basos: 1 %
EOS (ABSOLUTE): 0.1 x10E3/uL (ref 0.0–0.4)
Eos: 2 %
Hematocrit: 52.3 % — ABNORMAL HIGH (ref 37.5–51.0)
Hemoglobin: 16.8 g/dL (ref 13.0–17.7)
Immature Grans (Abs): 0 x10E3/uL (ref 0.0–0.1)
Immature Granulocytes: 0 %
Lymphocytes Absolute: 1.4 x10E3/uL (ref 0.7–3.1)
Lymphs: 24 %
MCH: 25.1 pg — ABNORMAL LOW (ref 26.6–33.0)
MCHC: 32.1 g/dL (ref 31.5–35.7)
MCV: 78 fL — ABNORMAL LOW (ref 79–97)
Monocytes Absolute: 0.5 x10E3/uL (ref 0.1–0.9)
Monocytes: 8 %
Neutrophils Absolute: 3.8 x10E3/uL (ref 1.4–7.0)
Neutrophils: 65 %
Platelets: 251 x10E3/uL (ref 150–450)
RBC: 6.68 x10E6/uL — ABNORMAL HIGH (ref 4.14–5.80)
RDW: 18.6 % — ABNORMAL HIGH (ref 11.6–15.4)
WBC: 5.8 x10E3/uL (ref 3.4–10.8)

## 2024-08-16 LAB — CMP14+EGFR
ALT: 13 IU/L (ref 0–44)
AST: 17 IU/L (ref 0–40)
Albumin: 4.2 g/dL (ref 3.9–4.9)
Alkaline Phosphatase: 79 IU/L (ref 47–123)
BUN/Creatinine Ratio: 17 (ref 10–24)
BUN: 22 mg/dL (ref 8–27)
Bilirubin Total: 0.8 mg/dL (ref 0.0–1.2)
CO2: 23 mmol/L (ref 20–29)
Calcium: 9.7 mg/dL (ref 8.6–10.2)
Chloride: 96 mmol/L (ref 96–106)
Creatinine, Ser: 1.29 mg/dL — ABNORMAL HIGH (ref 0.76–1.27)
Globulin, Total: 2.8 g/dL (ref 1.5–4.5)
Glucose: 304 mg/dL — ABNORMAL HIGH (ref 70–99)
Potassium: 4.2 mmol/L (ref 3.5–5.2)
Sodium: 136 mmol/L (ref 134–144)
Total Protein: 7 g/dL (ref 6.0–8.5)
eGFR: 63 mL/min/1.73

## 2024-08-16 LAB — BRAIN NATRIURETIC PEPTIDE: BNP: 58.7 pg/mL (ref 0.0–100.0)

## 2024-08-27 ENCOUNTER — Encounter: Payer: Self-pay | Admitting: Internal Medicine

## 2024-08-28 ENCOUNTER — Other Ambulatory Visit: Payer: Self-pay | Admitting: Internal Medicine

## 2024-08-28 DIAGNOSIS — K603 Anal fistula, unspecified: Secondary | ICD-10-CM

## 2024-08-28 DIAGNOSIS — K611 Rectal abscess: Secondary | ICD-10-CM

## 2024-08-31 ENCOUNTER — Encounter: Payer: Self-pay | Admitting: Internal Medicine

## 2024-09-01 ENCOUNTER — Other Ambulatory Visit: Payer: Self-pay | Admitting: Internal Medicine

## 2024-09-01 DIAGNOSIS — I10 Essential (primary) hypertension: Secondary | ICD-10-CM

## 2024-09-03 ENCOUNTER — Encounter: Payer: Self-pay | Admitting: Internal Medicine

## 2024-09-03 ENCOUNTER — Ambulatory Visit: Admitting: Internal Medicine

## 2024-09-03 VITALS — BP 138/84 | HR 102 | Ht 70.0 in | Wt 170.0 lb

## 2024-09-03 DIAGNOSIS — T783XXD Angioneurotic edema, subsequent encounter: Secondary | ICD-10-CM | POA: Diagnosis not present

## 2024-09-03 DIAGNOSIS — Z09 Encounter for follow-up examination after completed treatment for conditions other than malignant neoplasm: Secondary | ICD-10-CM | POA: Diagnosis not present

## 2024-09-03 DIAGNOSIS — J0101 Acute recurrent maxillary sinusitis: Secondary | ICD-10-CM

## 2024-09-03 DIAGNOSIS — T783XXA Angioneurotic edema, initial encounter: Secondary | ICD-10-CM | POA: Insufficient documentation

## 2024-09-03 MED ORDER — AZITHROMYCIN 250 MG PO TABS: ORAL_TABLET | ORAL | 0 refills | Status: DC

## 2024-09-03 MED ORDER — EPINEPHRINE 0.3 MG/0.3ML IJ SOAJ: 0.3000 mg | INTRAMUSCULAR | 2 refills | Status: AC | PRN

## 2024-09-03 NOTE — Assessment & Plan Note (Signed)
 Although his lower nose and upper lip swelling is likely due to angioedema, but he appears to have acute sinusitis as well Started empiric azithromycin  as he has worsening of symptoms recently Continue nasal saline spray or Flonase as needed for nasal congestion Uses Navage for sinus irrigation

## 2024-09-03 NOTE — Assessment & Plan Note (Signed)
 ER chart reviewed, including imaging Likely had angioedema like reaction

## 2024-09-03 NOTE — Assessment & Plan Note (Addendum)
 Acute onset lower lip and upper lip swelling likely due to angioedema Reports having a new dog at home Does not recall any new or unusual food intake Although he is on losartan , he has other more likely etiologic factors for angioedema recently Advised to complete oral prednisone  course - 40 mg QD X 5 days EpiPen  prescribed Referred to allergy clinic for further evaluation

## 2024-09-03 NOTE — Progress Notes (Signed)
 "  Acute Office Visit  Subjective:    Patient ID: [name], male    DOB: 1961-08-20, 64 y.o.   MRN: [id number]  Chief Complaint  Patient presents with   Lip and nose swelling    Has issue with his lip and nose. Nose/lip is causing pain.    HPI Patient is in today for complaint of acute onset upper lip and lower nose swelling since 08/31/24.  He initially went to urgent care, and was later sent to ER.  He had CT of facial bones, which showed swelling of the lower nose and upper lip area.  He was given oral prednisone  40 mg from the ER.  He has been taking Benadryl  and Zyrtec, has felt improvement in the lip swelling, but still reports nasal burning and throbbing in the lip area.  He also reports left-sided facial pain.  Has history of chronic sinusitis.  Denies any fever or chills.  He had dyspnea and odynophagia initially, but has improved now.  He reports having a new puppy at home in the last month.  He also has used cleaning liquid recently to clean the surfaces after getting the puppy.  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  Patent coronary bypass grafts with a free radial graft to PDA and LIMA to the  diag.   Diabetes mellitus    Diabetic peripheral neuropathy (HCC) 05/06/2020   Dyslipidemia    Poorly controlled, probably related to his DM (Some of this is secondary to his inability to afford medications).   ED (erectile dysfunction)    Esophageal yeast infection (HCC)    Gastroparesis    GERD (gastroesophageal reflux disease) 2001   Hyperlipidemia 1990   Hypertension 2001   Metaplasia of esophagus    Pancreatitis chronic    Pseudocyst   Polycythemia vera(238.4) 04/26/2012   PONV (postoperative nausea and vomiting)    Sleep apnea    No  longer use sleep apnea machine    Past Surgical History:  Procedure Laterality Date   APPENDECTOMY  08/1978   1980   BIOPSY  11/11/2021   Procedure: BIOPSY;  Surgeon: Golda Claudis PENNER, MD;  [facility]: [facility];  Service: Endoscopy;;   CARDIAC CATHETERIZATION     CHOLECYSTECTOMY  09/1998   COLONOSCOPY  07/31/1985   COLONOSCOPY N/A 03/11/2016   Procedure: COLONOSCOPY;  Surgeon: Claudis PENNER Golda, MD;  Location: [facility];  Service: Endoscopy;  Laterality: N/A;   COLONOSCOPY WITH PROPOFOL  N/A 11/11/2021   Procedure: COLONOSCOPY WITH PROPOFOL ;  Surgeon: Golda Claudis PENNER, MD;  Location: [facility];  Service: Endoscopy;  Laterality: N/A;  205   CORONARY ANGIOPLASTY WITH STENT PLACEMENT  08/23/2004   CORONARY ARTERY BYPASS GRAFT  11/24/2004   ESOPHAGOGASTRODUODENOSCOPY  09/19/2001   ESOPHAGOGASTRODUODENOSCOPY N/A 03/11/2016   Procedure: ESOPHAGOGASTRODUODENOSCOPY (EGD);  Surgeon: Claudis PENNER Golda, MD;  [facility]: [facility];  Service: Endoscopy;  Laterality: N/A;  12:00   ESOPHAGOGASTRODUODENOSCOPY (EGD) WITH PROPOFOL  N/A 11/11/2021   Procedure: ESOPHAGOGASTRODUODENOSCOPY (EGD) WITH PROPOFOL ;  Surgeon: Golda Claudis PENNER, MD;  [facility]: [facility];  Service: Endoscopy;  Laterality: N/A;   ESOPHAGOGASTRODUODENOSCOPY (EGD) WITH PROPOFOL  N/A 10/25/2023  Procedure: ESOPHAGOGASTRODUODENOSCOPY (EGD) WITH PROPOFOL ;  Surgeon: Eartha Angelia Sieving, MD;  Location: [facility];  Service: Gastroenterology;  Laterality: N/A;  10:45am;asa 3   FINGER TENDON REPAIR Bilateral    in middle finger   KNEE SURGERY     left knee   NASAL SEPTUM SURGERY     POLYPECTOMY  11/11/2021   Procedure: POLYPECTOMY;  Surgeon: Golda Claudis PENNER, MD;  Location: [facility];  Service: Endoscopy;;    Family History  Problem Relation Age of Onset   Coronary artery disease Mother        strong family history of   Heart attack Mother        dying of (at age 72)   Diabetes Mother    Early death Mother     Heart disease Mother    Diabetes Sister    Hypertension Sister    Diabetes Brother    Pancreatitis Brother    Early death Brother    Heart disease Brother    Hypertension Brother    Hyperlipidemia Daughter    Hypertension Daughter    Kidney disease Daughter    Heart disease Brother    Cancer Brother    Hypertension Brother    Hyperlipidemia Brother    Early death Brother    Heart disease Brother     Social History   Socioeconomic History   Marital status: Married    Spouse name: Not on file   Number of children: Not on file   Years of education: Not on file   Highest education level: Not on file  Occupational History   Not on file  Tobacco Use   Smoking status: Never   Smokeless tobacco: Never  Vaping Use   Vaping status: Never Used  Substance and Sexual Activity   Alcohol use: Not Currently   Drug use: No   Sexual activity: Not on file  Other Topics Concern   Not on file  Social History Narrative   The patient lives in [place] with his wife.  He is     disabled secondary to coronary artery disease and pancreatitis.  He does     not smoke, he does not drink alcohol.  There is no drug use.    Social Drivers of Health   Tobacco Use: Low Risk (09/03/2024)   Patient History    Smoking Tobacco Use: Never    Smokeless Tobacco Use: Never    Passive Exposure: Not on file  Financial Resource Strain: Low Risk (11/29/2023)   Overall Financial Resource Strain (CARDIA)    Difficulty of Paying Living Expenses: Not hard at all  Food Insecurity: No Food Insecurity (05/09/2024)   Epic    Worried About Programme Researcher, Broadcasting/film/video in the Last Year: Never true    Ran Out of Food in the Last Year: Never true  Transportation Needs: No Transportation Needs (05/09/2024)   Epic    Lack of Transportation (Medical): No    Lack of Transportation (Non-Medical): No  Physical Activity: Inactive (11/29/2023)   Exercise Vital Sign    Days of Exercise per Week: 0 days    Minutes of Exercise  per Session: 0 min  Stress: No Stress Concern Present (11/29/2023)   [employer] of Occupational Health - Occupational Stress Questionnaire    Feeling of Stress : Only a little  Social Connections: Moderately Integrated (11/29/2023)   Social Connection and Isolation Panel    Frequency of Communication with Friends and Family: More than three times a  week    Frequency of Social Gatherings with Friends and Family: More than three times a week    Attends Religious Services: More than 4 times per year    Active Member of Clubs or Organizations: No    Attends Banker Meetings: Never    Marital Status: Married  Catering Manager Violence: Not At Risk (05/09/2024)   Epic    Fear of Current or Ex-Partner: No    Emotionally Abused: No    Physically Abused: No    Sexually Abused: No  Depression (PHQ2-9): Low Risk (09/03/2024)   Depression (PHQ2-9)    PHQ-2 Score: 0  Alcohol Screen: Low Risk (11/29/2023)   Alcohol Screen    Last Alcohol Screening Score (AUDIT): 0  Housing: Unknown (08/20/2024)   Received from [facility]   Epic    Unable to Pay for Housing in the Last Year: Not on file    Number of Times Moved in the Last Year: Not on file    At any time in the past 12 months, were you homeless or living in a shelter (including now)?: No  Utilities: Not At Risk (05/09/2024)   Epic    Threatened with loss of utilities: No  Health Literacy: Adequate Health Literacy (11/29/2023)   B1300 Health Literacy    Frequency of need for help with medical instructions: Never    Outpatient Medications Prior to Visit  Medication Sig Dispense Refill   albuterol (VENTOLIN HFA) 108 (90 Base) MCG/ACT inhaler Inhale 2 puffs into the lungs every 6 (six) hours as needed for wheezing or shortness of breath. 18 g 0   Cholecalciferol  125 MCG (5000 UT) TABS Take 10,000 Units by mouth daily.     clopidogrel  (PLAVIX ) 75 MG tablet TAKE 1 TABLET BY MOUTH ONCE DAILY as a blood thinner 180  tablet 2   COMFORT EZ PEN NEEDLES 32G X 4 MM MISC USE AS DIRECTED FOR injecting insulin  100 each 3   Continuous Glucose Receiver (FREESTYLE LIBRE 3 READER) DEVI Use it to check blood glucose as instructed. 1 each 0   Continuous Glucose Sensor (FREESTYLE LIBRE 3 PLUS SENSOR) MISC Change sensor every 15 days. 6 each 3   diazepam  (VALIUM ) 10 MG tablet Take 0.5 tablets (5 mg total) by mouth at bedtime as needed for anxiety or sleep. (Patient taking differently: Take 5-15 mg by mouth at bedtime as needed for anxiety or sleep.) 30 tablet 0   DULoxetine  (CYMBALTA ) 60 MG capsule TAKE ONE CAPSULE BY MOUTH DAILY 90 capsule 1   ezetimibe  (ZETIA ) 10 MG tablet TAKE 1 TABLET BY MOUTH DAILY 90 tablet 0   gabapentin  (NEURONTIN ) 300 MG capsule Take 1 capsule (300 mg total) by mouth 3 (three) times daily. (Patient taking differently: Take 300 mg by mouth at bedtime.) 270 capsule 1   hydrochlorothiazide  (HYDRODIURIL ) 25 MG tablet TAKE 1 TABLET BY MOUTH IN THE MORNING 90 tablet 1   HYDROcodone -acetaminophen  (NORCO) 10-325 MG tablet TAKE 1 TABLET BY MOUTH EVERY 6 HOURS AS NEEDED FOR SEVERE PAIN 120 tablet 0   icosapent  Ethyl (VASCEPA ) 1 g capsule TAKE TWO CAPSULES BY MOUTH TWICE DAILY 360 capsule 0   insulin  isophane & regular human KwikPen (NOVOLIN  70/30 KWIKPEN) (70-30) 100 UNIT/ML KwikPen Inject 60 Units into the skin 2 (two) times daily. 45 mL 5   lansoprazole  (PREVACID ) 30 MG capsule Take 1 capsule (30 mg total) by mouth 2 (two) times daily before a meal. (Patient taking differently: Take 30 mg by  mouth daily before breakfast.) 180 capsule 1   losartan  (COZAAR ) 50 MG tablet Take 1 tablet (50 mg total) by mouth daily. (Patient taking differently: Take 25 mg by mouth at bedtime.) 90 tablet 3   metFORMIN  (GLUCOPHAGE -XR) 500 MG 24 hr tablet TAKE 1 TABLET BY MOUTH TWICE DAILY WITH A MEAL 180 tablet 1   methocarbamol  (ROBAXIN ) 500 MG tablet Take 1 tablet (500 mg total) by mouth every 8 (eight) hours as needed for muscle  spasms. 30 tablet 0   metoprolol  tartrate (LOPRESSOR ) 25 MG tablet TAKE 1 TABLET BY MOUTH TWICE DAILY 180 tablet 1   nitroGLYCERIN  (NITROSTAT ) 0.4 MG SL tablet Place 0.4 mg under the tongue every 5 (five) minutes as needed for chest pain. Reported on 02/26/2016     ondansetron  (ZOFRAN ) 4 MG tablet Take 1 tablet (4 mg total) by mouth every 8 (eight) hours as needed for nausea or vomiting. 30 tablet 0   polyethylene glycol (MIRALAX  / GLYCOLAX ) 17 g packet Take 17 g by mouth daily as needed for moderate constipation.     PRESCRIPTION MEDICATION Take 1 tablet by mouth in the morning and at bedtime. Motillium (domperidone) 10 mg     solifenacin (VESICARE) 10 MG tablet Take 10 mg by mouth daily.     tadalafil  (CIALIS ) 5 MG tablet Take 1 tablet (5 mg total) by mouth daily as needed for erectile dysfunction. 30 tablet 11   methylPREDNISolone  (MEDROL  DOSEPAK) 4 MG TBPK tablet Take as package instructions. 1 each 0   No facility-administered medications prior to visit.    Allergies[1]  Review of Systems  Constitutional:  Negative for chills and fever.  HENT:  Positive for congestion, postnasal drip, sinus pain and tinnitus.        Hearing difficulty  Eyes:  Negative for pain and discharge.  Respiratory:  Positive for cough and shortness of breath (Intermittent).   Cardiovascular:  Negative for palpitations.  Gastrointestinal:  Negative for diarrhea, nausea and vomiting.  Endocrine: Negative for polydipsia and polyuria.  Genitourinary:  Positive for difficulty urinating. Negative for hematuria.  Musculoskeletal:  Positive for arthralgias, back pain and neck pain. Negative for neck stiffness.  Skin:  Negative for rash.  Neurological:  Positive for numbness (B/l LE, intermittent). Negative for dizziness, weakness and headaches.  Psychiatric/Behavioral:  Negative for agitation and behavioral problems.        Objective:    Physical Exam Vitals reviewed.  Constitutional:      General: He is not in  acute distress.    Appearance: He is not diaphoretic.  HENT:     Head: Normocephalic and atraumatic.     Nose: Mucosal edema and congestion present.     Right Sinus: No maxillary sinus tenderness or frontal sinus tenderness.     Left Sinus: Maxillary sinus tenderness present. No frontal sinus tenderness.     Mouth/Throat:     Mouth: Mucous membranes are moist.     Comments: Mild upper lip swelling Eyes:     General: No scleral icterus.    Extraocular Movements: Extraocular movements intact.  Cardiovascular:     Rate and Rhythm: Normal rate and regular rhythm.     Heart sounds: Normal heart sounds. No murmur heard. Pulmonary:     Breath sounds: No wheezing or rales.  Musculoskeletal:     Cervical back: Neck supple. No tenderness.     Right lower leg: No edema.     Left lower leg: No edema.  Skin:    General:  Skin is warm.     Findings: No rash.  Neurological:     General: No focal deficit present.     Mental Status: He is alert and oriented to person, place, and time.     Sensory: Sensory deficit (B/l feet) present.     Motor: No weakness.  Psychiatric:        Mood and Affect: Mood normal.        Behavior: Behavior normal.     BP 138/84 (BP Location: Left Arm)   Pulse (!) 102   Ht 5' 10 (1.778 m)   Wt 170 lb (77.1 kg)   SpO2 99%   BMI 24.39 kg/m  Wt Readings from Last 3 Encounters:  09/03/24 170 lb (77.1 kg)  08/09/24 168 lb 6.4 oz (76.4 kg)  05/09/24 160 lb 4.8 oz (72.7 kg)        Assessment & Plan:   Problem List Items Addressed This Visit       Respiratory   Acute recurrent maxillary sinusitis   Although his lower nose and upper lip swelling is likely due to angioedema, but he appears to have acute sinusitis as well Started empiric azithromycin  as he has worsening of symptoms recently Continue nasal saline spray or Flonase as needed for nasal congestion Uses Navage for sinus irrigation      Relevant Medications   azithromycin  (ZITHROMAX ) 250 MG  tablet     Other   Angio-edema - Primary   Acute onset lower lip and upper lip swelling likely due to angioedema Reports having a new dog at home Does not recall any new or unusual food intake Although he is on losartan , he has other more likely etiologic factors for angioedema recently Advised to complete oral prednisone  course - 40 mg QD X 5 days EpiPen  prescribed Referred to allergy clinic for further evaluation      Relevant Medications   EPINEPHrine  (EPIPEN  2-PAK) 0.3 mg/0.3 mL IJ SOAJ injection   Other Relevant Orders   Ambulatory referral to Allergy   Encounter for examination following treatment at hospital   ER chart reviewed, including imaging Likely had angioedema like reaction        Meds ordered this encounter  Medications   EPINEPHrine  (EPIPEN  2-PAK) 0.3 mg/0.3 mL IJ SOAJ injection    Sig: Inject 0.3 mg into the muscle as needed for anaphylaxis.    Dispense:  1 each    Refill:  2   azithromycin  (ZITHROMAX ) 250 MG tablet    Sig: Take 2 tablets on day 1, then 1 tablet daily on days 2 through 5    Dispense:  6 tablet    Refill:  0     Kobey Sides MARLA Blanch, MD     [1]  Allergies Allergen Reactions   Iodinated Contrast Media Hives, Itching and Nausea Only    Flushing, Burning, Itching, Angioedema    Iohexol  Hives and Shortness Of Breath    Needs prep meds    Metoclopramide Hcl Hives    Patient became very spastic   Alfuzosin  Hcl Er Other (See Comments)    Dropped blood pressure   Sulfonamide Derivatives Hives    Large hives   Enalapril  Cough   Penicillins Rash    Immediate rash, facial/tongue/throat swelling, SOB or lightheadedness with hypotension   Sulfa Antibiotics Rash   "

## 2024-09-03 NOTE — Patient Instructions (Addendum)
 Please complete course of Prednisone .  Please start taking Azithromycin  as prescribed.  Please use Epipen  if you notice worsening of lip swelling or difficulty breathing.

## 2024-09-08 ENCOUNTER — Other Ambulatory Visit: Payer: Self-pay

## 2024-09-08 ENCOUNTER — Emergency Department (HOSPITAL_COMMUNITY)

## 2024-09-08 ENCOUNTER — Inpatient Hospital Stay (HOSPITAL_COMMUNITY)
Admission: EM | Admit: 2024-09-08 | Discharge: 2024-09-11 | DRG: 872 | Disposition: A | Discharge status: Home / Self Care | Attending: Internal Medicine | Admitting: Internal Medicine

## 2024-09-08 ENCOUNTER — Encounter (HOSPITAL_COMMUNITY): Payer: Self-pay

## 2024-09-08 DIAGNOSIS — Z888 Allergy status to other drugs, medicaments and biological substances status: Secondary | ICD-10-CM

## 2024-09-08 DIAGNOSIS — E118 Type 2 diabetes mellitus with unspecified complications: Secondary | ICD-10-CM

## 2024-09-08 DIAGNOSIS — M25511 Pain in right shoulder: Secondary | ICD-10-CM

## 2024-09-08 DIAGNOSIS — E1165 Type 2 diabetes mellitus with hyperglycemia: Secondary | ICD-10-CM | POA: Diagnosis present

## 2024-09-08 DIAGNOSIS — L03211 Cellulitis of face: Principal | ICD-10-CM | POA: Diagnosis present

## 2024-09-08 DIAGNOSIS — A419 Sepsis, unspecified organism: Principal | ICD-10-CM | POA: Diagnosis present

## 2024-09-08 DIAGNOSIS — K5900 Constipation, unspecified: Secondary | ICD-10-CM | POA: Diagnosis present

## 2024-09-08 DIAGNOSIS — F419 Anxiety disorder, unspecified: Secondary | ICD-10-CM | POA: Diagnosis present

## 2024-09-08 DIAGNOSIS — K861 Other chronic pancreatitis: Secondary | ICD-10-CM | POA: Diagnosis present

## 2024-09-08 DIAGNOSIS — L97529 Non-pressure chronic ulcer of other part of left foot with unspecified severity: Secondary | ICD-10-CM | POA: Diagnosis present

## 2024-09-08 DIAGNOSIS — B370 Candidal stomatitis: Secondary | ICD-10-CM | POA: Diagnosis present

## 2024-09-08 DIAGNOSIS — L03115 Cellulitis of right lower limb: Secondary | ICD-10-CM | POA: Diagnosis present

## 2024-09-08 DIAGNOSIS — X58XXXA Exposure to other specified factors, initial encounter: Secondary | ICD-10-CM | POA: Diagnosis present

## 2024-09-08 DIAGNOSIS — K8681 Exocrine pancreatic insufficiency: Secondary | ICD-10-CM | POA: Diagnosis present

## 2024-09-08 DIAGNOSIS — Z22322 Carrier or suspected carrier of Methicillin resistant Staphylococcus aureus: Secondary | ICD-10-CM

## 2024-09-08 DIAGNOSIS — Z7984 Long term (current) use of oral hypoglycemic drugs: Secondary | ICD-10-CM

## 2024-09-08 DIAGNOSIS — R6510 Systemic inflammatory response syndrome (SIRS) of non-infectious origin without acute organ dysfunction: Principal | ICD-10-CM | POA: Diagnosis present

## 2024-09-08 DIAGNOSIS — T783XXA Angioneurotic edema, initial encounter: Secondary | ICD-10-CM | POA: Diagnosis present

## 2024-09-08 DIAGNOSIS — Z8249 Family history of ischemic heart disease and other diseases of the circulatory system: Secondary | ICD-10-CM

## 2024-09-08 DIAGNOSIS — F39 Unspecified mood [affective] disorder: Secondary | ICD-10-CM | POA: Diagnosis present

## 2024-09-08 DIAGNOSIS — J329 Chronic sinusitis, unspecified: Secondary | ICD-10-CM | POA: Diagnosis present

## 2024-09-08 DIAGNOSIS — I10 Essential (primary) hypertension: Secondary | ICD-10-CM | POA: Diagnosis present

## 2024-09-08 DIAGNOSIS — Z955 Presence of coronary angioplasty implant and graft: Secondary | ICD-10-CM

## 2024-09-08 DIAGNOSIS — Z880 Allergy status to penicillin: Secondary | ICD-10-CM

## 2024-09-08 DIAGNOSIS — K219 Gastro-esophageal reflux disease without esophagitis: Secondary | ICD-10-CM | POA: Diagnosis present

## 2024-09-08 DIAGNOSIS — G8929 Other chronic pain: Secondary | ICD-10-CM | POA: Diagnosis present

## 2024-09-08 DIAGNOSIS — Z951 Presence of aortocoronary bypass graft: Secondary | ICD-10-CM

## 2024-09-08 DIAGNOSIS — Z833 Family history of diabetes mellitus: Secondary | ICD-10-CM

## 2024-09-08 DIAGNOSIS — Z794 Long term (current) use of insulin: Secondary | ICD-10-CM

## 2024-09-08 DIAGNOSIS — Z91041 Radiographic dye allergy status: Secondary | ICD-10-CM

## 2024-09-08 DIAGNOSIS — I2510 Atherosclerotic heart disease of native coronary artery without angina pectoris: Secondary | ICD-10-CM | POA: Diagnosis present

## 2024-09-08 DIAGNOSIS — Z882 Allergy status to sulfonamides status: Secondary | ICD-10-CM

## 2024-09-08 DIAGNOSIS — Z9049 Acquired absence of other specified parts of digestive tract: Secondary | ICD-10-CM

## 2024-09-08 DIAGNOSIS — Z79899 Other long term (current) drug therapy: Secondary | ICD-10-CM

## 2024-09-08 DIAGNOSIS — R0602 Shortness of breath: Secondary | ICD-10-CM

## 2024-09-08 DIAGNOSIS — E1142 Type 2 diabetes mellitus with diabetic polyneuropathy: Secondary | ICD-10-CM | POA: Diagnosis present

## 2024-09-08 DIAGNOSIS — N179 Acute kidney failure, unspecified: Secondary | ICD-10-CM | POA: Diagnosis present

## 2024-09-08 DIAGNOSIS — Z7902 Long term (current) use of antithrombotics/antiplatelets: Secondary | ICD-10-CM

## 2024-09-08 LAB — URINALYSIS, ROUTINE W REFLEX MICROSCOPIC
Bilirubin Urine: NEGATIVE
Glucose, UA: NEGATIVE mg/dL
Hgb urine dipstick: NEGATIVE
Ketones, ur: NEGATIVE mg/dL
Nitrite: NEGATIVE
Protein, ur: >=300 mg/dL — AB
Specific Gravity, Urine: 1.024 (ref 1.005–1.030)
WBC, UA: >50 WBC/hpf (ref 0–5)
pH: 5 (ref 5.0–8.0)

## 2024-09-08 LAB — COMPREHENSIVE METABOLIC PANEL WITH GFR
ALT: 18 U/L (ref 0–44)
AST: 32 U/L (ref 15–41)
Albumin: 4.1 g/dL (ref 3.5–5.0)
Alkaline Phosphatase: 99 U/L (ref 38–126)
Anion gap: 20 — ABNORMAL HIGH (ref 5–15)
BUN: 32 mg/dL — ABNORMAL HIGH (ref 8–23)
CO2: 26 mmol/L (ref 22–32)
Calcium: 10 mg/dL (ref 8.9–10.3)
Chloride: 93 mmol/L — ABNORMAL LOW (ref 98–111)
Creatinine, Ser: 1.95 mg/dL — ABNORMAL HIGH (ref 0.61–1.24)
GFR, Estimated: 38 mL/min — ABNORMAL LOW
Glucose, Bld: 76 mg/dL (ref 70–99)
Potassium: 3.9 mmol/L (ref 3.5–5.1)
Sodium: 139 mmol/L (ref 135–145)
Total Bilirubin: 0.7 mg/dL (ref 0.0–1.2)
Total Protein: 7.9 g/dL (ref 6.5–8.1)

## 2024-09-08 LAB — CBC WITH DIFFERENTIAL/PLATELET
Abs Immature Granulocytes: 0.73 K/uL — ABNORMAL HIGH (ref 0.00–0.07)
Basophils Absolute: 0.2 K/uL — ABNORMAL HIGH (ref 0.0–0.1)
Basophils Relative: 1 %
Eosinophils Absolute: 0.1 K/uL (ref 0.0–0.5)
Eosinophils Relative: 0 %
HCT: 60.8 % — ABNORMAL HIGH (ref 39.0–52.0)
Hemoglobin: 18.6 g/dL — ABNORMAL HIGH (ref 13.0–17.0)
Immature Granulocytes: 4 %
Lymphocytes Relative: 21 %
Lymphs Abs: 3.8 K/uL (ref 0.7–4.0)
MCH: 24.4 pg — ABNORMAL LOW (ref 26.0–34.0)
MCHC: 30.6 g/dL (ref 30.0–36.0)
MCV: 79.8 fL — ABNORMAL LOW (ref 80.0–100.0)
Monocytes Absolute: 1.5 K/uL — ABNORMAL HIGH (ref 0.1–1.0)
Monocytes Relative: 9 %
Neutro Abs: 11.4 K/uL — ABNORMAL HIGH (ref 1.7–7.7)
Neutrophils Relative %: 65 %
Platelets: 422 K/uL — ABNORMAL HIGH (ref 150–400)
RBC: 7.62 MIL/uL — ABNORMAL HIGH (ref 4.22–5.81)
RDW: 20.5 % — ABNORMAL HIGH (ref 11.5–15.5)
WBC: 17.5 K/uL — ABNORMAL HIGH (ref 4.0–10.5)
nRBC: 0.1 % (ref 0.0–0.2)

## 2024-09-08 LAB — LIPASE, BLOOD: Lipase: 13 U/L (ref 11–51)

## 2024-09-08 LAB — PRO BRAIN NATRIURETIC PEPTIDE: Pro Brain Natriuretic Peptide: 1216 pg/mL — ABNORMAL HIGH

## 2024-09-08 LAB — GLUCOSE, CAPILLARY: Glucose-Capillary: 70 mg/dL (ref 70–99)

## 2024-09-08 LAB — CBG MONITORING, ED: Glucose-Capillary: 106 mg/dL — ABNORMAL HIGH (ref 70–99)

## 2024-09-08 LAB — TROPONIN T, HIGH SENSITIVITY
Troponin T High Sensitivity: 106 ng/L (ref 0–19)
Troponin T High Sensitivity: 83 ng/L — ABNORMAL HIGH (ref 0–19)

## 2024-09-08 MED ORDER — CLINDAMYCIN PHOSPHATE 600 MG/50ML IV SOLN
600.0000 mg | Freq: Once | INTRAVENOUS | Status: AC
  Administered 2024-09-08: 600 mg via INTRAVENOUS
  Filled 2024-09-08: qty 50

## 2024-09-08 MED ORDER — LORAZEPAM 2 MG/ML IJ SOLN: 2.0000 mg | Freq: Once | INTRAMUSCULAR | Status: DC

## 2024-09-08 MED ORDER — HYDROMORPHONE HCL 1 MG/ML IJ SOLN
1.0000 mg | Freq: Once | INTRAMUSCULAR | Status: AC
  Administered 2024-09-08: 1 mg via INTRAVENOUS
  Filled 2024-09-08: qty 1

## 2024-09-08 MED ORDER — LORAZEPAM 2 MG/ML IJ SOLN
1.0000 mg | Freq: Once | INTRAMUSCULAR | Status: AC
  Administered 2024-09-08: 1 mg via INTRAVENOUS
  Filled 2024-09-08: qty 1

## 2024-09-08 NOTE — ED Provider Triage Note (Signed)
 Emergency Medicine Provider Triage Evaluation Note  PHUONG MOFFATT , a 64 y.o. male  was evaluated in triage.  Pt complains of shortness of breath, facial swelling, abdominal pain over the past week.  Started since taking prednisone  in the setting of URI symptoms.  Review of Systems  Positive:  Negative:   Physical Exam  BP 116/88   Pulse 96   SpO2 (!) 87%  Gen:   Awake, Resp:  increased effort  MSK:   Moves extremities without difficulty  Other:  Some swelling noted of upper lip, no significant tongue swelling  Medical Decision Making  Medically screening exam initiated at 6:51 PM.  Appropriate orders placed.  IMRAAN WENDELL was informed that the remainder of the evaluation will be completed by another provider, this initial triage assessment does not replace that evaluation, and the importance of remaining in the ED until their evaluation is complete.     Donnajean Lynwood DEL, PA-C 09/08/24 1853

## 2024-09-08 NOTE — ED Provider Notes (Signed)
 " [place] EMERGENCY DEPARTMENT AT [facility] Provider Note   CSN: [id number] Arrival date & time: 09/08/24  1840     Patient presents with: Allergic Reaction   [name] is a 64 y.o. male.  He is here with a complaint of some facial swelling, shortness of breath.  He said he saw his PCP for this and was put on a Z-Pak and prednisone .  Facial swelling got worse and was seen in the ED and it was thought to be possibly allergic.  Has been taking Benadryl .  Facial swelling is getting worse and painful, primarily located on the upper lip and under her nose.  Also feels very short of breath.  He said the pain in his face is exacerbating his chronic pain and his hydrocodone  has not been effective.  {Add pertinent medical, surgical, social history, OB history to YEP:67052} The history is provided by the patient.  Facial Injury Location:  Nose and mouth Mouth location:  Lip(s) Time since incident:  1 week Pain details:    Quality:  Throbbing   Severity:  Severe   Timing:  Constant   Progression:  Worsening Associated symptoms: difficulty breathing   Associated symptoms: no epistaxis, no malocclusion, no nausea and no vomiting        Prior to Admission medications  Medication Sig Start Date End Date Taking? Authorizing Provider  albuterol (VENTOLIN HFA) 108 (90 Base) MCG/ACT inhaler Inhale 2 puffs into the lungs every 6 (six) hours as needed for wheezing or shortness of breath. 08/09/24   [name], MD  azithromycin  (ZITHROMAX ) 250 MG tablet Take 2 tablets on day 1, then 1 tablet daily on days 2 through 5 09/03/24 09/08/24  [name], MD  Cholecalciferol  125 MCG (5000 UT) TABS Take 10,000 Units by mouth daily.    [provider]  clopidogrel  (PLAVIX ) 75 MG tablet TAKE 1 TABLET BY MOUTH ONCE DAILY as a blood thinner 09/19/23   Tobie, Suzzane POUR, MD  COMFORT EZ PEN NEEDLES 32G X 4 MM MISC USE AS DIRECTED FOR injecting insulin  01/17/24   [name], MD   Continuous Glucose Receiver (FREESTYLE LIBRE 3 READER) DEVI Use it to check blood glucose as instructed. 11/30/23   [name], MD  Continuous Glucose Sensor (FREESTYLE LIBRE 3 PLUS SENSOR) MISC Change sensor every 15 days. 11/30/23   [name], MD  diazepam  (VALIUM ) 10 MG tablet Take 0.5 tablets (5 mg total) by mouth at bedtime as needed for anxiety or sleep. Patient taking differently: Take 5-15 mg by mouth at bedtime as needed for anxiety or sleep. 01/29/24   [name], MD  DULoxetine  (CYMBALTA ) 60 MG capsule TAKE ONE CAPSULE BY MOUTH DAILY 06/04/24   [name], MD  EPINEPHrine  (EPIPEN  2-PAK) 0.3 mg/0.3 mL IJ SOAJ injection Inject 0.3 mg into the muscle as needed for anaphylaxis. 09/03/24   [name], MD  ezetimibe  (ZETIA ) 10 MG tablet TAKE 1 TABLET BY MOUTH DAILY 06/14/24   [name], MD  gabapentin  (NEURONTIN ) 300 MG capsule Take 1 capsule (300 mg total) by mouth 3 (three) times daily. Patient taking differently: Take 300 mg by mouth at bedtime. 04/09/24   [name], MD  hydrochlorothiazide  (HYDRODIURIL ) 25 MG tablet TAKE 1 TABLET BY MOUTH IN THE MORNING 09/03/24   [name], MD  HYDROcodone -acetaminophen  (NORCO) 10-325 MG tablet TAKE 1 TABLET BY MOUTH EVERY 6 HOURS AS NEEDED FOR SEVERE PAIN 08/06/24  [name], MD  icosapent  Ethyl (VASCEPA ) 1 g capsule TAKE TWO CAPSULES BY MOUTH TWICE DAILY 06/14/24   Lavona Agent, MD  insulin  isophane & regular human KwikPen (NOVOLIN  70/30 KWIKPEN) (70-30) 100 UNIT/ML KwikPen Inject 60 Units into the skin 2 (two) times daily. 12/06/23   [name], MD  lansoprazole  (PREVACID ) 30 MG capsule Take 1 capsule (30 mg total) by mouth 2 (two) times daily before a meal. Patient taking differently: Take 30 mg by mouth daily before breakfast. 08/12/23   Rudy Josette RAMAN, PA-C  losartan  (COZAAR ) 50 MG tablet Take 1 tablet (50 mg total) by mouth daily. Patient taking differently: Take 25 mg by mouth at bedtime. 04/09/24    [name], MD  metFORMIN  (GLUCOPHAGE -XR) 500 MG 24 hr tablet TAKE 1 TABLET BY MOUTH TWICE DAILY WITH A MEAL 07/17/24   [name], MD  methocarbamol  (ROBAXIN ) 500 MG tablet Take 1 tablet (500 mg total) by mouth every 8 (eight) hours as needed for muscle spasms. 04/27/24   [name], MD  metoprolol  tartrate (LOPRESSOR ) 25 MG tablet TAKE 1 TABLET BY MOUTH TWICE DAILY 03/12/24   Patel, Rutwik K, MD  nitroGLYCERIN  (NITROSTAT ) 0.4 MG SL tablet Place 0.4 mg under the tongue every 5 (five) minutes as needed for chest pain. Reported on 02/26/2016    [provider]  ondansetron  (ZOFRAN ) 4 MG tablet Take 1 tablet (4 mg total) by mouth every 8 (eight) hours as needed for nausea or vomiting. 01/20/23   [name], MD  polyethylene glycol (MIRALAX  / GLYCOLAX ) 17 g packet Take 17 g by mouth daily as needed for moderate constipation.    [provider]  PRESCRIPTION MEDICATION Take 1 tablet by mouth in the morning and at bedtime. Motillium (domperidone) 10 mg    [provider]  solifenacin (VESICARE) 10 MG tablet Take 10 mg by mouth daily.    [provider]  tadalafil  (CIALIS ) 5 MG tablet Take 1 tablet (5 mg total) by mouth daily as needed for erectile dysfunction. 09/21/23   McKenzie, Belvie CROME, MD    Allergies: Iodinated contrast media, Iohexol , Metoclopramide hcl, Alfuzosin  hcl er, Sulfonamide derivatives, Enalapril , Penicillins, and Sulfa antibiotics    Review of Systems  HENT:  Negative for nosebleeds.   Gastrointestinal:  Negative for nausea and vomiting.    Updated Vital Signs BP 112/84   Pulse 97   Resp 17   SpO2 98%   Physical Exam Vitals and nursing note reviewed.  Constitutional:      Appearance: He is well-developed.  HENT:     Head: Normocephalic and atraumatic.     Nose:     Comments: He has some erythema under his nose and significant fullness and tenderness of his upper lip.  Possibly some fluctuance during intraoral palpation.   No trismus.  Speaking in full sentences.  No neck crepitus. Eyes:     Conjunctiva/sclera: Conjunctivae normal.  Cardiovascular:     Rate and Rhythm: Normal rate and regular rhythm.     Heart sounds: No murmur heard. Pulmonary:     Effort: Pulmonary effort is normal. No respiratory distress.     Breath sounds: Normal breath sounds.  Abdominal:     Palpations: Abdomen is soft.     Tenderness: There is no abdominal tenderness.  Musculoskeletal:        General: No deformity.     Cervical back: Neck supple.  Skin:    General: Skin is warm and  dry.  Neurological:     General: No focal deficit present.     Mental Status: He is alert.     GCS: GCS eye subscore is 4. GCS verbal subscore is 5. GCS motor subscore is 6.     (all labs ordered are listed, but only abnormal results are displayed) Labs Reviewed  CBC WITH DIFFERENTIAL/PLATELET  COMPREHENSIVE METABOLIC PANEL WITH GFR  PRO BRAIN NATRIURETIC PEPTIDE  LIPASE, BLOOD  URINALYSIS, ROUTINE W REFLEX MICROSCOPIC  TROPONIN T, HIGH SENSITIVITY    EKG: EKG Interpretation Date/Time:  Saturday September 08 2024 18:53:22 EST Ventricular Rate:  100 PR Interval:  118 QRS Duration:  103 QT Interval:  340 QTC Calculation: 439 R Axis:   93  Text Interpretation: Sinus tachycardia Consider right ventricular hypertrophy Inferior infarct, old increased rate and nonspecific STs from prior 4/22 Confirmed by Towana Sharper [phone]) on 09/08/2024 6:56:17 PM  Radiology: No results found.  {Document cardiac monitor, telemetry assessment procedure when appropriate:32947} Procedures   Medications Ordered in the ED  HYDROmorphone  (DILAUDID ) injection 1 mg (has no administration in time range)  clindamycin  (CLEOCIN ) IVPB 600 mg (has no administration in time range)      {Click here for ABCD2, HEART and other calculators REFRESH Note before signing:1}                              Medical Decision Making Amount and/or Complexity of Data  Reviewed Radiology: ordered.  Risk Prescription drug management.   This patient complains of ***; this involves an extensive number of treatment Options and is a complaint that carries with it a high risk of complications and morbidity. The differential includes ***  I ordered, reviewed and interpreted labs, which included *** I ordered medication *** and reviewed PMP when indicated. I ordered imaging studies which included *** and I independently    visualized and interpreted imaging which showed *** Additional history obtained from *** Previous records obtained and reviewed *** I consulted *** and discussed lab and imaging findings and discussed disposition.  Cardiac monitoring reviewed, *** Social determinants considered, *** Critical Interventions: ***  After the interventions stated above, I reevaluated the patient and found *** Admission and further testing considered, ***   {Document critical care time when appropriate  Document review of labs and clinical decision tools ie CHADS2VASC2, etc  Document your independent review of radiology images and any outside records  Document your discussion with family members, caretakers and with consultants  Document social determinants of health affecting pt's care  Document your decision making why or why not admission, treatments were needed:32947:::1}   Final diagnoses:  None    ED Discharge Orders     None        "

## 2024-09-08 NOTE — ED Triage Notes (Signed)
 Pt comes in for an allergic rxn. Pt believes its prednisone . Pt has sob, sore throat, swelling of upper lip and nose. A&Ox4 in triage.   Swelling started after pt started to take prednisone 

## 2024-09-09 ENCOUNTER — Other Ambulatory Visit: Payer: Self-pay | Admitting: Internal Medicine

## 2024-09-09 ENCOUNTER — Observation Stay (HOSPITAL_COMMUNITY)

## 2024-09-09 ENCOUNTER — Other Ambulatory Visit (HOSPITAL_COMMUNITY): Payer: Self-pay | Admitting: *Deleted

## 2024-09-09 DIAGNOSIS — L03115 Cellulitis of right lower limb: Secondary | ICD-10-CM | POA: Diagnosis present

## 2024-09-09 DIAGNOSIS — Z8249 Family history of ischemic heart disease and other diseases of the circulatory system: Secondary | ICD-10-CM | POA: Diagnosis not present

## 2024-09-09 DIAGNOSIS — R7989 Other specified abnormal findings of blood chemistry: Secondary | ICD-10-CM | POA: Diagnosis not present

## 2024-09-09 DIAGNOSIS — E1165 Type 2 diabetes mellitus with hyperglycemia: Secondary | ICD-10-CM | POA: Diagnosis present

## 2024-09-09 DIAGNOSIS — G8929 Other chronic pain: Secondary | ICD-10-CM | POA: Diagnosis present

## 2024-09-09 DIAGNOSIS — R0789 Other chest pain: Secondary | ICD-10-CM | POA: Diagnosis not present

## 2024-09-09 DIAGNOSIS — T783XXA Angioneurotic edema, initial encounter: Secondary | ICD-10-CM | POA: Diagnosis present

## 2024-09-09 DIAGNOSIS — Z833 Family history of diabetes mellitus: Secondary | ICD-10-CM | POA: Diagnosis not present

## 2024-09-09 DIAGNOSIS — E1140 Type 2 diabetes mellitus with diabetic neuropathy, unspecified: Secondary | ICD-10-CM

## 2024-09-09 DIAGNOSIS — F419 Anxiety disorder, unspecified: Secondary | ICD-10-CM | POA: Diagnosis present

## 2024-09-09 DIAGNOSIS — R6510 Systemic inflammatory response syndrome (SIRS) of non-infectious origin without acute organ dysfunction: Secondary | ICD-10-CM

## 2024-09-09 DIAGNOSIS — X58XXXA Exposure to other specified factors, initial encounter: Secondary | ICD-10-CM | POA: Diagnosis present

## 2024-09-09 DIAGNOSIS — L97529 Non-pressure chronic ulcer of other part of left foot with unspecified severity: Secondary | ICD-10-CM | POA: Diagnosis present

## 2024-09-09 DIAGNOSIS — B370 Candidal stomatitis: Secondary | ICD-10-CM | POA: Diagnosis present

## 2024-09-09 DIAGNOSIS — E1142 Type 2 diabetes mellitus with diabetic polyneuropathy: Secondary | ICD-10-CM | POA: Diagnosis present

## 2024-09-09 DIAGNOSIS — A419 Sepsis, unspecified organism: Secondary | ICD-10-CM | POA: Diagnosis present

## 2024-09-09 DIAGNOSIS — K861 Other chronic pancreatitis: Secondary | ICD-10-CM | POA: Diagnosis present

## 2024-09-09 DIAGNOSIS — J329 Chronic sinusitis, unspecified: Secondary | ICD-10-CM | POA: Diagnosis present

## 2024-09-09 DIAGNOSIS — K219 Gastro-esophageal reflux disease without esophagitis: Secondary | ICD-10-CM | POA: Diagnosis present

## 2024-09-09 DIAGNOSIS — I10 Essential (primary) hypertension: Secondary | ICD-10-CM | POA: Diagnosis present

## 2024-09-09 DIAGNOSIS — N179 Acute kidney failure, unspecified: Secondary | ICD-10-CM | POA: Diagnosis present

## 2024-09-09 DIAGNOSIS — L03211 Cellulitis of face: Secondary | ICD-10-CM | POA: Diagnosis present

## 2024-09-09 DIAGNOSIS — F39 Unspecified mood [affective] disorder: Secondary | ICD-10-CM | POA: Diagnosis present

## 2024-09-09 DIAGNOSIS — I2510 Atherosclerotic heart disease of native coronary artery without angina pectoris: Secondary | ICD-10-CM | POA: Diagnosis present

## 2024-09-09 DIAGNOSIS — Z7902 Long term (current) use of antithrombotics/antiplatelets: Secondary | ICD-10-CM | POA: Diagnosis not present

## 2024-09-09 DIAGNOSIS — Z794 Long term (current) use of insulin: Secondary | ICD-10-CM | POA: Diagnosis not present

## 2024-09-09 DIAGNOSIS — Z7984 Long term (current) use of oral hypoglycemic drugs: Secondary | ICD-10-CM | POA: Diagnosis not present

## 2024-09-09 DIAGNOSIS — K8681 Exocrine pancreatic insufficiency: Secondary | ICD-10-CM | POA: Diagnosis present

## 2024-09-09 DIAGNOSIS — K5900 Constipation, unspecified: Secondary | ICD-10-CM | POA: Diagnosis present

## 2024-09-09 LAB — ECHOCARDIOGRAM COMPLETE
Area-P 1/2: 3.91 cm2
Height: 69.5 in
S' Lateral: 2.3 cm
Single Plane A2C EF: 55.6 %
Weight: 2645.52 [oz_av]

## 2024-09-09 LAB — CBC WITH DIFFERENTIAL/PLATELET
Abs Immature Granulocytes: 0.35 K/uL — ABNORMAL HIGH (ref 0.00–0.07)
Basophils Absolute: 0.1 K/uL (ref 0.0–0.1)
Basophils Relative: 1 %
Eosinophils Absolute: 0.1 K/uL (ref 0.0–0.5)
Eosinophils Relative: 1 %
HCT: 58.8 % — ABNORMAL HIGH (ref 39.0–52.0)
Hemoglobin: 17.9 g/dL — ABNORMAL HIGH (ref 13.0–17.0)
Immature Granulocytes: 3 %
Lymphocytes Relative: 24 %
Lymphs Abs: 3.3 K/uL (ref 0.7–4.0)
MCH: 24.5 pg — ABNORMAL LOW (ref 26.0–34.0)
MCHC: 30.4 g/dL (ref 30.0–36.0)
MCV: 80.4 fL (ref 80.0–100.0)
Monocytes Absolute: 0.8 K/uL (ref 0.1–1.0)
Monocytes Relative: 6 %
Neutro Abs: 8.9 K/uL — ABNORMAL HIGH (ref 1.7–7.7)
Neutrophils Relative %: 65 %
Platelets: 334 K/uL (ref 150–400)
RBC: 7.31 MIL/uL — ABNORMAL HIGH (ref 4.22–5.81)
RDW: 20.8 % — ABNORMAL HIGH (ref 11.5–15.5)
WBC: 13.6 K/uL — ABNORMAL HIGH (ref 4.0–10.5)
nRBC: 0 % (ref 0.0–0.2)

## 2024-09-09 LAB — BASIC METABOLIC PANEL WITH GFR
Anion gap: 18 — ABNORMAL HIGH (ref 5–15)
BUN: 35 mg/dL — ABNORMAL HIGH (ref 8–23)
CO2: 25 mmol/L (ref 22–32)
Calcium: 9.2 mg/dL (ref 8.9–10.3)
Chloride: 90 mmol/L — ABNORMAL LOW (ref 98–111)
Creatinine, Ser: 1.58 mg/dL — ABNORMAL HIGH (ref 0.61–1.24)
GFR, Estimated: 49 mL/min — ABNORMAL LOW
Glucose, Bld: 273 mg/dL — ABNORMAL HIGH (ref 70–99)
Potassium: 3.6 mmol/L (ref 3.5–5.1)
Sodium: 134 mmol/L — ABNORMAL LOW (ref 135–145)

## 2024-09-09 LAB — PHOSPHORUS: Phosphorus: 3.9 mg/dL (ref 2.5–4.6)

## 2024-09-09 LAB — MAGNESIUM: Magnesium: 2 mg/dL (ref 1.7–2.4)

## 2024-09-09 LAB — GLUCOSE, CAPILLARY: Glucose-Capillary: 368 mg/dL — ABNORMAL HIGH (ref 70–99)

## 2024-09-09 LAB — MRSA NEXT GEN BY PCR, NASAL: MRSA by PCR Next Gen: DETECTED — AB

## 2024-09-09 MED ORDER — LACTATED RINGERS IV SOLN: INTRAVENOUS | Status: DC

## 2024-09-09 MED ORDER — ENOXAPARIN SODIUM 40 MG/0.4ML IJ SOSY: 40.0000 mg | PREFILLED_SYRINGE | INTRAMUSCULAR | Status: DC

## 2024-09-09 MED ORDER — FAMOTIDINE 20 MG PO TABS
20.0000 mg | ORAL_TABLET | Freq: Every day | ORAL | Status: DC
  Administered 2024-09-09 – 2024-09-10 (×2): 20 mg via ORAL
  Filled 2024-09-09 (×2): qty 1

## 2024-09-09 MED ORDER — INSULIN ASPART 100 UNIT/ML IJ SOLN
0.0000 [IU] | Freq: Every day | INTRAMUSCULAR | Status: DC
  Administered 2024-09-09: 5 [IU] via SUBCUTANEOUS
  Administered 2024-09-10: 2 [IU] via SUBCUTANEOUS
  Filled 2024-09-09 (×2): qty 1

## 2024-09-09 MED ORDER — MELATONIN 3 MG PO TABS: 6.0000 mg | ORAL_TABLET | Freq: Every evening | ORAL | Status: DC | PRN

## 2024-09-09 MED ORDER — LINEZOLID 600 MG/300ML IV SOLN
600.0000 mg | Freq: Two times a day (BID) | INTRAVENOUS | Status: DC
  Administered 2024-09-09 – 2024-09-11 (×6): 600 mg via INTRAVENOUS
  Filled 2024-09-09 (×8): qty 300

## 2024-09-09 MED ORDER — MENTHOL 3 MG MT LOZG
1.0000 | LOZENGE | OROMUCOSAL | Status: DC | PRN
  Administered 2024-09-09 (×2): 3 mg via ORAL
  Filled 2024-09-09 (×2): qty 9

## 2024-09-09 MED ORDER — OXYCODONE HCL 5 MG PO TABS
5.0000 mg | ORAL_TABLET | Freq: Four times a day (QID) | ORAL | Status: DC | PRN
  Administered 2024-09-09 – 2024-09-11 (×5): 5 mg via ORAL
  Filled 2024-09-09 (×5): qty 1

## 2024-09-09 MED ORDER — DIAZEPAM 5 MG PO TABS: 5.0000 mg | ORAL_TABLET | Freq: Every evening | ORAL | Status: DC | PRN

## 2024-09-09 MED ORDER — SODIUM CHLORIDE 0.9 % IV SOLN
2.0000 g | INTRAVENOUS | Status: DC
  Administered 2024-09-09 – 2024-09-11 (×3): 2 g via INTRAVENOUS
  Filled 2024-09-09 (×3): qty 20

## 2024-09-09 MED ORDER — METOPROLOL TARTRATE 25 MG PO TABS
12.5000 mg | ORAL_TABLET | Freq: Two times a day (BID) | ORAL | Status: DC
  Administered 2024-09-09 – 2024-09-11 (×6): 12.5 mg via ORAL
  Filled 2024-09-09 (×6): qty 1

## 2024-09-09 MED ORDER — PANTOPRAZOLE SODIUM 20 MG PO TBEC
20.0000 mg | DELAYED_RELEASE_TABLET | Freq: Every day | ORAL | Status: DC
  Filled 2024-09-09: qty 1

## 2024-09-09 MED ORDER — ENOXAPARIN SODIUM 40 MG/0.4ML IJ SOSY
40.0000 mg | PREFILLED_SYRINGE | INTRAMUSCULAR | Status: DC
  Administered 2024-09-09 – 2024-09-11 (×3): 40 mg via SUBCUTANEOUS
  Filled 2024-09-09 (×3): qty 0.4

## 2024-09-09 MED ORDER — LACTATED RINGERS IV SOLN: INTRAVENOUS | Status: AC

## 2024-09-09 MED ORDER — DULOXETINE HCL 60 MG PO CPEP
60.0000 mg | ORAL_CAPSULE | Freq: Every day | ORAL | Status: DC
  Administered 2024-09-09 – 2024-09-11 (×3): 60 mg via ORAL
  Filled 2024-09-09 (×3): qty 1

## 2024-09-09 MED ORDER — HYDROMORPHONE HCL 1 MG/ML IJ SOLN
0.5000 mg | INTRAMUSCULAR | Status: DC | PRN
  Administered 2024-09-09 – 2024-09-11 (×2): 0.5 mg via INTRAVENOUS
  Filled 2024-09-09 (×2): qty 0.5

## 2024-09-09 MED ORDER — INSULIN ASPART 100 UNIT/ML IJ SOLN
0.0000 [IU] | Freq: Three times a day (TID) | INTRAMUSCULAR | Status: DC
  Administered 2024-09-10: 7 [IU] via SUBCUTANEOUS
  Administered 2024-09-10: 5 [IU] via SUBCUTANEOUS
  Administered 2024-09-11: 2 [IU] via SUBCUTANEOUS
  Filled 2024-09-09 (×2): qty 1

## 2024-09-09 MED ORDER — PROCHLORPERAZINE EDISYLATE 10 MG/2ML IJ SOLN
5.0000 mg | Freq: Four times a day (QID) | INTRAMUSCULAR | Status: DC | PRN
  Administered 2024-09-10: 5 mg via INTRAVENOUS
  Filled 2024-09-09: qty 2

## 2024-09-09 MED ORDER — EZETIMIBE 10 MG PO TABS
10.0000 mg | ORAL_TABLET | Freq: Every day | ORAL | Status: DC
  Administered 2024-09-09 – 2024-09-11 (×3): 10 mg via ORAL
  Filled 2024-09-09 (×3): qty 1

## 2024-09-09 MED ORDER — POLYETHYLENE GLYCOL 3350 17 G PO PACK: 17.0000 g | PACK | Freq: Every day | ORAL | Status: DC | PRN

## 2024-09-09 MED ORDER — PANTOPRAZOLE SODIUM 40 MG PO TBEC
40.0000 mg | DELAYED_RELEASE_TABLET | Freq: Two times a day (BID) | ORAL | Status: DC
  Administered 2024-09-09 – 2024-09-11 (×4): 40 mg via ORAL
  Filled 2024-09-09 (×4): qty 1

## 2024-09-09 MED ORDER — PANTOPRAZOLE SODIUM 40 MG PO TBEC
40.0000 mg | DELAYED_RELEASE_TABLET | Freq: Every day | ORAL | Status: DC
  Administered 2024-09-09: 40 mg via ORAL
  Filled 2024-09-09: qty 1

## 2024-09-09 MED ORDER — HYDROMORPHONE HCL 1 MG/ML IJ SOLN
0.5000 mg | INTRAMUSCULAR | Status: AC | PRN
  Administered 2024-09-09 (×4): 0.5 mg via INTRAVENOUS
  Filled 2024-09-09 (×4): qty 0.5

## 2024-09-09 MED ORDER — ACETAMINOPHEN 325 MG PO TABS
650.0000 mg | ORAL_TABLET | Freq: Four times a day (QID) | ORAL | Status: DC | PRN
  Administered 2024-09-09: 650 mg via ORAL
  Filled 2024-09-09: qty 2

## 2024-09-09 MED ORDER — ASPIRIN 325 MG PO TABS
325.0000 mg | ORAL_TABLET | Freq: Once | ORAL | Status: AC
  Administered 2024-09-09: 325 mg via ORAL
  Filled 2024-09-09: qty 1

## 2024-09-09 NOTE — Progress Notes (Signed)
 Patient placed in OBSERVATION for SIRS with Diabetic ulcer on Rt toe, Inpatient Care Manager (ICM) conducted chart review and visited with patient at bedside to complete brief admission assessment and issue MOON form  Patient  confirmed he lives in 2-LVL home with bedroom on 1st floor with his wife, Dtr & SIL, he is retired, continues to drive, and endorses completely independent with ADLs, but has a RW, Medical Laboratory Scientific Officer, Paediatric nurse, and BSC.    No immediate discharge needs identified at this time. However, IPCM team will continue to follow along and monitor patient advancement through interdisciplinary progression rounds. If transition of care needs arise, please enter a ICM consult to prompt IPCM team to follow up.    09/09/24 1645  TOC Brief Assessment  Insurance and Status Reviewed  Patient has primary care physician Yes  Home environment has been reviewed Home with Spouse, Dtr & SIL  Prior level of function: Independent  Prior/Current Home Services No current home services  Social Drivers of Health Review SDOH reviewed no interventions necessary  Readmission risk has been reviewed Yes  Transition of care needs no transition of care needs at this time

## 2024-09-09 NOTE — H&P (Incomplete)
 " History and Physical  [name] FMW:[id number] DOB: 07-Jan-1961 DOA: 09/08/2024  Referring physician: Dr. Towana, EDP  PCP: [name], MD  Outpatient Specialists: None Patient coming from: Home, lives with his wife.  Chief Complaint: Philtrum edema  HPI: [name] is a 64 y.o. male with medical history significant for chronic sinusitis, type 2 diabetes, diabetic polyneuropathy, hypertension, hyperlipidemia, chronic anxiety, GERD, who presents to the ER due to philtrum edema x 1 week.  The patient was recently diagnosed with acute recurrent maxillary sinusitis and was prescribed azithromycin .  He also had a prescription for prednisone  that he was advised to complete x 5 days by his primary care provider.    Endorses dull, moderate pain, in his chest radiating to his abdomen after getting out of the shower today.  Denies diarrhea.  Admits to constipation and abdominal cramps.  Due to concern for possible allergy to prednisone , the patient presented to the ER.    In the ER, no skin rashes noted on exam.  No lip, tongue, or facial swelling noted.  He endorses a right lateral foot diabetic wound.  He has minimal sensation in his feet.  CT maxillofacial without contrast was obtained to evaluate philtrum edema.  It reveals no fluid collection to suggest abscess.  Mild edema of the upper lip and the lower part of the nose.  Due to concern for cellulitis, patient received a dose of IV clindamycin  in the ER.  Right foot x-ray for diabetic wound is pending.  High-sensitivity troponin 106, repeat 83.  proBNP 1216.  His chest pain had improved.  Admitted by [facility], hospitalist service.  ED Course: Temperature 97.5.  BP 141/90, pulse 99, respiratory rate 18, O2 saturation 97% on 2 L [place].  Review of Systems: Review of systems as noted in the HPI. All other systems reviewed and are negative.   Past Medical History:  Diagnosis Date   Allergy    Anemia    Anxiety 1998   Bypass graft  stenosis    '06   Cancer (HCC)    Polycythemia vera   Cervical spinal stenosis 05/06/2020   C5-6 level   Chronic gastritis    Coronary artery disease    Patent Stent to Circ.  Patent coronary bypass grafts with a free radial graft to PDA and LIMA to the  diag.   Diabetes mellitus    Diabetic peripheral neuropathy (HCC) 05/06/2020   Dyslipidemia    Poorly controlled, probably related to his DM (Some of this is secondary to his inability to afford medications).   ED (erectile dysfunction)    Esophageal yeast infection (HCC)    Gastroparesis    GERD (gastroesophageal reflux disease) 2001   Hyperlipidemia 1990   Hypertension 2001   Metaplasia of esophagus    Pancreatitis chronic    Pseudocyst   Polycythemia vera(238.4) 04/26/2012   PONV (postoperative nausea and vomiting)    Sleep apnea    No longer use sleep apnea machine   Past Surgical History:  Procedure Laterality Date   APPENDECTOMY  08/1978   1980   BIOPSY  11/11/2021   Procedure: BIOPSY;  Surgeon: Golda Claudis PENNER, MD;  [facility];  Service: Endoscopy;;   CARDIAC CATHETERIZATION     CHOLECYSTECTOMY  09/1998   COLONOSCOPY  07/31/1985   COLONOSCOPY N/A 03/11/2016   Procedure: COLONOSCOPY;  Surgeon: Claudis PENNER Golda, MD;  [facility]: [facility];  Service: Endoscopy;  Laterality: N/A;   COLONOSCOPY WITH PROPOFOL  N/A  11/11/2021   Procedure: COLONOSCOPY WITH PROPOFOL ;  Surgeon: Golda Claudis PENNER, MD;  [facility];  Service: Endoscopy;  Laterality: N/A;  205   CORONARY ANGIOPLASTY WITH STENT PLACEMENT  08/23/2004   CORONARY ARTERY BYPASS GRAFT  11/24/2004   ESOPHAGOGASTRODUODENOSCOPY  09/19/2001   ESOPHAGOGASTRODUODENOSCOPY N/A 03/11/2016   Procedure: ESOPHAGOGASTRODUODENOSCOPY (EGD);  Surgeon: Claudis PENNER Golda, MD;  [facility];  Service: Endoscopy;  Laterality: N/A;  12:00   ESOPHAGOGASTRODUODENOSCOPY (EGD) WITH PROPOFOL  N/A 11/11/2021   Procedure: ESOPHAGOGASTRODUODENOSCOPY (EGD) WITH  PROPOFOL ;  Surgeon: Golda Claudis PENNER, MD;  [facility];  Service: Endoscopy;  Laterality: N/A;   ESOPHAGOGASTRODUODENOSCOPY (EGD) WITH PROPOFOL  N/A 10/25/2023   Procedure: ESOPHAGOGASTRODUODENOSCOPY (EGD) WITH PROPOFOL ;  Surgeon: Eartha Angelia Sieving, MD;  [facility];  Service: Gastroenterology;  Laterality: N/A;  10:45am;asa 3   FINGER TENDON REPAIR Bilateral    in middle finger   KNEE SURGERY     left knee   NASAL SEPTUM SURGERY     POLYPECTOMY  11/11/2021   Procedure: POLYPECTOMY;  Surgeon: Golda Claudis PENNER, MD;  [facility]: [facility];  Service: Endoscopy;;    Social History:  reports that he has never smoked. He has never used smokeless tobacco. He reports that he does not currently use alcohol. He reports that he does not use drugs.   Allergies[1]  Family History  Problem Relation Age of Onset   Coronary artery disease Mother        strong family history of   Heart attack Mother        dying of (at age 72)   Diabetes Mother    Early death Mother    Heart disease Mother    Diabetes Sister    Hypertension Sister    Diabetes Brother    Pancreatitis Brother    Early death Brother    Heart disease Brother    Hypertension Brother    Hyperlipidemia Daughter    Hypertension Daughter    Kidney disease Daughter    Heart disease Brother    Cancer Brother    Hypertension Brother    Hyperlipidemia Brother    Early death Brother    Heart disease Brother       Prior to Admission medications  Medication Sig Start Date End Date Taking? Authorizing Provider  albuterol (VENTOLIN HFA) 108 (90 Base) MCG/ACT inhaler Inhale 2 puffs into the lungs every 6 (six) hours as needed for wheezing or shortness of breath. 08/09/24   [name], MD  Cholecalciferol  125 MCG (5000 UT) TABS Take 10,000 Units by mouth daily.    [provider]  clopidogrel  (PLAVIX ) 75 MG tablet TAKE 1 TABLET BY MOUTH ONCE DAILY as a blood thinner 09/19/23   Tobie, Suzzane POUR, MD  COMFORT EZ PEN NEEDLES 32G X 4 MM MISC USE AS DIRECTED FOR injecting insulin  01/17/24   [name], MD  Continuous Glucose Receiver (FREESTYLE LIBRE 3 READER) DEVI Use it to check blood glucose as instructed. 11/30/23   [name], MD  Continuous Glucose Sensor (FREESTYLE LIBRE 3 PLUS SENSOR) MISC Change sensor every 15 days. 11/30/23   [name], MD  diazepam  (VALIUM ) 10 MG tablet Take 0.5 tablets (5 mg total) by mouth at bedtime as needed for anxiety or sleep. Patient taking differently: Take 5-15 mg by mouth at bedtime as needed for anxiety or sleep. 01/29/24   [name], MD  DULoxetine  (CYMBALTA ) 60 MG capsule TAKE ONE  CAPSULE BY MOUTH DAILY 06/04/24   [name], MD  EPINEPHrine  (EPIPEN  2-PAK) 0.3 mg/0.3 mL IJ SOAJ injection Inject 0.3 mg into the muscle as needed for anaphylaxis. 09/03/24   [name], MD  ezetimibe  (ZETIA ) 10 MG tablet TAKE 1 TABLET BY MOUTH DAILY 06/14/24   [name], MD  gabapentin  (NEURONTIN ) 300 MG capsule Take 1 capsule (300 mg total) by mouth 3 (three) times daily. Patient taking differently: Take 300 mg by mouth at bedtime. 04/09/24   [name], MD  hydrochlorothiazide  (HYDRODIURIL ) 25 MG tablet TAKE 1 TABLET BY MOUTH IN THE MORNING 09/03/24   [name], MD  HYDROcodone -acetaminophen  (NORCO) 10-325 MG tablet TAKE 1 TABLET BY MOUTH EVERY 6 HOURS AS NEEDED FOR SEVERE PAIN 08/06/24   Patel, Rutwik K, MD  icosapent  Ethyl (VASCEPA ) 1 g capsule TAKE TWO CAPSULES BY MOUTH TWICE DAILY 06/14/24   Lavona Agent, MD  insulin  isophane & regular human KwikPen (NOVOLIN  70/30 KWIKPEN) (70-30) 100 UNIT/ML KwikPen Inject 60 Units into the skin 2 (two) times daily. 12/06/23   [name], MD  lansoprazole  (PREVACID ) 30 MG capsule Take 1 capsule (30 mg total) by mouth 2 (two) times daily before a meal. Patient taking differently: Take 30 mg by mouth daily before breakfast. 08/12/23   Rudy Josette RAMAN, PA-C  losartan  (COZAAR ) 50 MG  tablet Take 1 tablet (50 mg total) by mouth daily. Patient taking differently: Take 25 mg by mouth at bedtime. 04/09/24   [name], MD  metFORMIN  (GLUCOPHAGE -XR) 500 MG 24 hr tablet TAKE 1 TABLET BY MOUTH TWICE DAILY WITH A MEAL 07/17/24   [name], MD  methocarbamol  (ROBAXIN ) 500 MG tablet Take 1 tablet (500 mg total) by mouth every 8 (eight) hours as needed for muscle spasms. 04/27/24   [name], MD  metoprolol  tartrate (LOPRESSOR ) 25 MG tablet TAKE 1 TABLET BY MOUTH TWICE DAILY 03/12/24   Patel, Rutwik K, MD  nitroGLYCERIN  (NITROSTAT ) 0.4 MG SL tablet Place 0.4 mg under the tongue every 5 (five) minutes as needed for chest pain. Reported on 02/26/2016    [provider]  ondansetron  (ZOFRAN ) 4 MG tablet Take 1 tablet (4 mg total) by mouth every 8 (eight) hours as needed for nausea or vomiting. 01/20/23   [name], MD  polyethylene glycol (MIRALAX  / GLYCOLAX ) 17 g packet Take 17 g by mouth daily as needed for moderate constipation.    [provider]  PRESCRIPTION MEDICATION Take 1 tablet by mouth in the morning and at bedtime. Motillium (domperidone) 10 mg    [provider]  solifenacin (VESICARE) 10 MG tablet Take 10 mg by mouth daily.    [provider]  tadalafil  (CIALIS ) 5 MG tablet Take 1 tablet (5 mg total) by mouth daily as needed for erectile dysfunction. 09/21/23   McKenzie, Belvie CROME, MD    Physical Exam: BP (!) 141/90 (BP Location: Left Arm)   Pulse 99   Temp (!) 97.5 F (36.4 C) (Oral)   Resp 18   Ht 5' 9.5 (1.765 m)   Wt 75 kg   SpO2 97%   BMI 24.07 kg/m   General: 64 y.o. year-old male well developed well nourished in no acute distress.  Alert and oriented x3. Cardiovascular: Regular rate and rhythm with no rubs or gallops.  No thyromegaly or JVD noted.  No lower extremity edema. 2/4 pulses in all 4 extremities. Respiratory: Clear to auscultation with no wheezes or  rales. Good inspiratory effort. Abdomen: Soft  nontender nondistended with normal bowel sounds x4 quadrants. Muskuloskeletal: No cyanosis, clubbing or edema noted bilaterally Neuro: CN II-XII intact, strength, sensation, reflexes Skin: No ulcerative lesions noted or rashes.  Mild edema involving philtrum. Psychiatry: Judgement and insight appear normal. Mood is appropriate for condition and setting          Labs on Admission:  Basic Metabolic Panel: Recent Labs  Lab 09/08/24 1856  NA 139  K 3.9  CL 93*  CO2 26  GLUCOSE 76  BUN 32*  CREATININE 1.95*  CALCIUM  10.0   Liver Function Tests: Recent Labs  Lab 09/08/24 1856  AST 32  ALT 18  ALKPHOS 99  BILITOT 0.7  PROT 7.9  ALBUMIN 4.1   Recent Labs  Lab 09/08/24 1856  LIPASE 13   No results for input(s): AMMONIA in the last 168 hours. CBC: Recent Labs  Lab 09/08/24 1856  WBC 17.5*  NEUTROABS 11.4*  HGB 18.6*  HCT 60.8*  MCV 79.8*  PLT 422*   Cardiac Enzymes: No results for input(s): CKTOTAL, CKMB, CKMBINDEX, TROPONINI in the last 168 hours.  BNP (last 3 results) Recent Labs    08/09/24 1647  BNP 58.7    ProBNP (last 3 results) Recent Labs    09/08/24 1856  PROBNP 1,216.0*    CBG: Recent Labs  Lab 09/08/24 1925 09/08/24 2350  GLUCAP 106* 70    Radiological Exams on Admission: CT Maxillofacial Wo Contrast Result Date: 09/08/2024 EXAM: CT OF THE FACE WITHOUT CONTRAST 09/08/2024 09:17:43 PM TECHNIQUE: CT of the face was performed without the administration of intravenous contrast. Multiplanar reformatted images are provided for review. Automated exposure control, iterative reconstruction, and/or weight based adjustment of the mA/kV was utilized to reduce the radiation dose to as low as reasonably achievable. COMPARISON: None available. CLINICAL HISTORY: facial abscess FINDINGS: FACIAL BONES: No acute facial fracture. No mandibular dislocation. No suspicious bone lesion. ORBITS: Globes are intact. No acute traumatic injury. No  inflammatory change. SINUSES AND MASTOIDS: No acute abnormality. SOFT TISSUES: Mild edema of the upper lip and the lower part of the nose. No fluid collection. IMPRESSION: 1. No fluid collection to suggest abscess. 2. Mild edema of the upper lip and the lower part of the nose. Electronically signed by: Franky Stanford MD 09/08/2024 09:26 PM EST RP Workstation: [id number]   DG Chest Port 1 View Result Date: 09/08/2024 EXAM: 1 VIEW(S) XRAY OF THE CHEST 09/08/2024 07:33:52 PM COMPARISON: CT of the chest 05/23/2006. CLINICAL HISTORY: SOB (shortness of breath). FINDINGS: LUNGS AND PLEURA: No focal pulmonary opacity. No pleural effusion. No pneumothorax. HEART AND MEDIASTINUM: Status post cardiac surgery. Mild hiatal hernia seen in the paraesophageal region, unchanged. BONES AND SOFT TISSUES: No acute osseous abnormality. IMPRESSION: 1. No acute findings. Electronically signed by: Greig Pique MD 09/08/2024 07:42 PM EST RP Workstation: [id number]    EKG: I independently viewed the EKG done and my findings are as followed: Sinus tachycardia rate of 100.  QTc 439.  Assessment/Plan Present on Admission:  SIRS (systemic inflammatory response syndrome) (HCC)  Principal Problem:   SIRS (systemic inflammatory response syndrome) (HCC)  SIRS criteria concern for cellulitis of philtrum, likely causing the edema. Leukocytosis 17.5 K, tachycardia 110 CT maxillofacial without contrast was obtained to evaluate philtrum edema.  It reveals no fluid collection to suggest abscess.  Mild edema of the upper lip and the lower part of the nose.  Due to concern for cellulitis, patient received a dose of IV  clindamycin  in the ER. Currently on prednisone , prior to admission, which likely contributed to leukocytosis Currently afebrile, continue to monitor WBC and fever. IV linezolid , Rocephin . IV fluid LR 75 cc/h x 10 hours Pain control as needed Follow peripheral blood cultures x 2 Follow MRSA screening test.  Right lateral  foot diabetic wound, POA Follow x-ray of right foot. Continue antibiotics as stated above. Wound care specialist Local wound care with wound care specialist guidance.  Atypical chest pain Aspirin  325 mg x 1 Resume home Zetia  High-sensitivity troponin 106, repeat 83. No evidence of acute ischemia on 12-lead EKG Follow transthoracic echocardiogram Monitor on telemetry.  AKI, suspect prerenal in the setting of dehydration Baseline creatinine 1.2 with GFR greater than 60 Presented with creatinine 1.95 with GFR of 38 Monitor urine output Avoid hepatotoxic agents, dehydration, and hypotension Currently on LR at 75 cc/h x 10 hours Repeat BMP in the morning. Closely monitor volume status while on IV fluid  Recently diagnosed with acute recurrent maxillary sinusitis, POA Continue antibiotics as stated above.  Type 2 diabetes with hyperlipidemia Last hemoglobin A1c 9.4 on 08/09/2024 Insulin  coverage Heart healthy carb modified diet.  Diabetic polyneuropathy Resume home regimen. Fall precautions.  Hypertension. Restart home Lopressor  Hold off home losartan  Closely monitor vital signs  Hyperlipidemia Resume home regimen.  GERD Resume home regimen.  Chronic anxiety Resume home regimen.   Time: 75 minutes.    DVT prophylaxis: Subcu Lovenox  daily.  Code Status: Full code.  Family Communication: The patient's wife at bedside.  Disposition Plan: Admitted to telemetry unit.  Consults called: None.  Admission status: Observation status.   Status is: Observation    Terry LOISE Hurst MD [facility] Pager [phone]  If 7PM-7AM, please contact night-coverage [email or web address] Password [email or web address]  09/09/2024, 12:01 AM      [1]  Allergies Allergen Reactions   Iodinated Contrast Media Hives, Itching and Nausea Only    Flushing, Burning, Itching, Angioedema    Iohexol  Hives and Shortness Of Breath    Needs prep meds    Metoclopramide Hcl Hives    Patient  became very spastic   Alfuzosin  Hcl Er Other (See Comments)    Dropped blood pressure   Sulfonamide Derivatives Hives    Large hives   Enalapril  Cough   Penicillins Rash    Immediate rash, facial/tongue/throat swelling, SOB or lightheadedness with hypotension   Sulfa Antibiotics Rash   "

## 2024-09-09 NOTE — Progress Notes (Signed)
*  PRELIMINARY RESULTS* Echocardiogram 2D Echocardiogram has been performed.  Teresa Aida PARAS 09/09/2024, 11:15 AM

## 2024-09-09 NOTE — Care Management Obs Status (Signed)
 MEDICARE OBSERVATION STATUS NOTIFICATION   Patient Details  Name: [name] MRN: [id number] Date of Birth: 1961/04/10   Medicare Observation Status Notification Given:  Yes    Ronnald MARLA Sil, RN 09/09/2024, 2:18 PM

## 2024-09-09 NOTE — Consult Note (Signed)
 WOC team consulted for R lateral foot wound.  Secure chat to primary team requesting photo be uploaded for consult.   Please note that the [facility] nursing team is utilizing a standardized work plan to manage patient consults. We are triaging consults and will try to see the patients within 24 hours. Wound photos in the patient's chart allow us  to consult on the patient in the most efficient and timely manner.    Thank you,    Powell Bar MSN, RN-BC, [employer]

## 2024-09-09 NOTE — Progress Notes (Signed)
 Patient seen and examined; admitted after midnight secondary to atypical chest pain facial swelling and concern for right foot ulcer.  Patient reporting overall improvement in his facial swelling/type II angioedema.  No fever, no nausea, no vomiting, no chest pain or shortness of breath.  Good saturation appreciated on room air.  Patient x-ray demonstrated no osteomyelitis.  Please refer to H&P written by Dr. Shona on 09/09/24 for further info/details on admission.  Plan: - Continue IV antibiotics - Maintain adequate hydration and follow clinical response - Will continue treatment with steroids and the use of Pepcid . Will also use PRN cepacol for complaints of sore throat. -Appreciate assistance and recommendation by wound care services. -Continue as needed analgesics and check uric acid level.  Eric Nunnery MD [phone]

## 2024-09-09 NOTE — Plan of Care (Signed)
" °  Problem: Education: Goal: Knowledge of General Education information will improve Description: Including pain rating scale, medication(s)/side effects and non-pharmacologic comfort measures Outcome: Progressing   Problem: Health Behavior/Discharge Planning: Goal: Ability to manage health-related needs will improve Outcome: Progressing   Problem: Clinical Measurements: Goal: Ability to maintain clinical measurements within normal limits will improve Outcome: Progressing Goal: Respiratory complications will improve Outcome: Progressing Goal: Cardiovascular complication will be avoided Outcome: Progressing   Problem: Activity: Goal: Risk for activity intolerance will decrease Outcome: Progressing   Problem: Elimination: Goal: Will not experience complications related to bowel motility Outcome: Progressing Goal: Will not experience complications related to urinary retention Outcome: Progressing   Problem: Pain Managment: Goal: General experience of comfort will improve and/or be controlled Outcome: Progressing   Problem: Safety: Goal: Ability to remain free from injury will improve Outcome: Progressing   Problem: Skin Integrity: Goal: Risk for impaired skin integrity will decrease Outcome: Progressing   Problem: Coping: Goal: Level of anxiety will decrease Outcome: Not Progressing   "

## 2024-09-09 NOTE — Consult Note (Signed)
 WOC Nurse Consult Note: Reason for Consult: Right lateral foot diabetic wound, noted in photos to also have left lateral foot ulceration Patient admitted for allergic reaction with facial swelling. He has a history of DM with his last HA1C at 9.4.   Wound type: Neuropathic malleolar foot wounds, lateral, bilateral Pressure Injury POA: NA Measurement: see nursing flow sheets Wound azi:anuy appear chronic and clean Drainage (amount, consistency, odor) see nursing flowsheets Periwound: slightly macerated Dressing procedure/placement/frequency: Cleanse each wound with saline, cover with foam dressing.    Re consult if needed, will not follow at this time. Thanks  [name] [employer], RN,CWOCN, CNS, [employer] [phone]

## 2024-09-10 DIAGNOSIS — A419 Sepsis, unspecified organism: Secondary | ICD-10-CM

## 2024-09-10 DIAGNOSIS — L03115 Cellulitis of right lower limb: Secondary | ICD-10-CM

## 2024-09-10 DIAGNOSIS — Z794 Long term (current) use of insulin: Secondary | ICD-10-CM | POA: Diagnosis not present

## 2024-09-10 DIAGNOSIS — N179 Acute kidney failure, unspecified: Secondary | ICD-10-CM

## 2024-09-10 DIAGNOSIS — B370 Candidal stomatitis: Secondary | ICD-10-CM

## 2024-09-10 DIAGNOSIS — E1165 Type 2 diabetes mellitus with hyperglycemia: Secondary | ICD-10-CM

## 2024-09-10 DIAGNOSIS — R6510 Systemic inflammatory response syndrome (SIRS) of non-infectious origin without acute organ dysfunction: Secondary | ICD-10-CM | POA: Diagnosis not present

## 2024-09-10 DIAGNOSIS — K219 Gastro-esophageal reflux disease without esophagitis: Secondary | ICD-10-CM

## 2024-09-10 LAB — CBC
HCT: 50.3 % (ref 39.0–52.0)
Hemoglobin: 15.2 g/dL (ref 13.0–17.0)
MCH: 24.6 pg — ABNORMAL LOW (ref 26.0–34.0)
MCHC: 30.2 g/dL (ref 30.0–36.0)
MCV: 81.5 fL (ref 80.0–100.0)
Platelets: 217 K/uL (ref 150–400)
RBC: 6.17 MIL/uL — ABNORMAL HIGH (ref 4.22–5.81)
RDW: 19.6 % — ABNORMAL HIGH (ref 11.5–15.5)
WBC: 6.3 K/uL (ref 4.0–10.5)
nRBC: 0 % (ref 0.0–0.2)

## 2024-09-10 LAB — GLUCOSE, CAPILLARY
Glucose-Capillary: 331 mg/dL — ABNORMAL HIGH (ref 70–99)
Glucose-Capillary: 411 mg/dL — ABNORMAL HIGH (ref 70–99)
Glucose-Capillary: 283 mg/dL — ABNORMAL HIGH (ref 70–99)
Glucose-Capillary: 213 mg/dL — ABNORMAL HIGH (ref 70–99)

## 2024-09-10 LAB — URIC ACID: Uric Acid, Serum: 6.8 mg/dL (ref 3.7–8.6)

## 2024-09-10 LAB — BASIC METABOLIC PANEL WITH GFR
Anion gap: 11 (ref 5–15)
BUN: 30 mg/dL — ABNORMAL HIGH (ref 8–23)
CO2: 29 mmol/L (ref 22–32)
Calcium: 8.4 mg/dL — ABNORMAL LOW (ref 8.9–10.3)
Chloride: 92 mmol/L — ABNORMAL LOW (ref 98–111)
Creatinine, Ser: 1.32 mg/dL — ABNORMAL HIGH (ref 0.61–1.24)
GFR, Estimated: >60 mL/min
Glucose, Bld: 347 mg/dL — ABNORMAL HIGH (ref 70–99)
Potassium: 4.2 mmol/L (ref 3.5–5.1)
Sodium: 132 mmol/L — ABNORMAL LOW (ref 135–145)

## 2024-09-10 MED ORDER — NYSTATIN 100000 UNIT/ML MT SUSP
5.0000 mL | Freq: Four times a day (QID) | OROMUCOSAL | Status: DC
  Administered 2024-09-10 – 2024-09-11 (×6): 500000 [IU] via ORAL
  Filled 2024-09-10 (×6): qty 5

## 2024-09-10 MED ORDER — FLUCONAZOLE IN SODIUM CHLORIDE 200-0.9 MG/100ML-% IV SOLN
200.0000 mg | INTRAVENOUS | Status: AC
  Administered 2024-09-10 – 2024-09-11 (×2): 200 mg via INTRAVENOUS
  Filled 2024-09-10 (×2): qty 100

## 2024-09-10 MED ORDER — INSULIN GLARGINE 100 UNIT/ML ~~LOC~~ SOLN
40.0000 [IU] | Freq: Every day | SUBCUTANEOUS | Status: DC
  Administered 2024-09-10 – 2024-09-11 (×2): 40 [IU] via SUBCUTANEOUS
  Filled 2024-09-10 (×3): qty 0.4

## 2024-09-10 MED ORDER — CHLORHEXIDINE GLUCONATE CLOTH 2 % EX PADS
6.0000 | MEDICATED_PAD | Freq: Every day | CUTANEOUS | Status: DC
  Administered 2024-09-11: 6 via TOPICAL

## 2024-09-10 MED ORDER — MUPIROCIN 2 % EX OINT
1.0000 | TOPICAL_OINTMENT | Freq: Two times a day (BID) | CUTANEOUS | Status: DC
  Administered 2024-09-10 – 2024-09-11 (×4): 1 via NASAL
  Filled 2024-09-10: qty 22

## 2024-09-10 MED ORDER — INSULIN ASPART 100 UNIT/ML IJ SOLN
15.0000 [IU] | Freq: Once | INTRAMUSCULAR | Status: AC
  Administered 2024-09-10: 15 [IU] via SUBCUTANEOUS
  Filled 2024-09-10: qty 1

## 2024-09-10 NOTE — Progress Notes (Signed)
 MD [facility] notified face to face of pt's elevated CBG >400 and need for updated insulin  coverage. MD states will place orders for long acting insulin  and adjust SS insulin .

## 2024-09-10 NOTE — Progress Notes (Signed)
 " PROGRESS NOTE    [name]  FMW:[id number] DOB: 24-Aug-1960 DOA: 09/08/2024  PCP: [name], MD    Chief Complaint  Patient presents with   Allergic Reaction    Brief admission narrative:  As per H&P written by Dr. Shona on 09/09/2024 [name] is a 64 y.o. male with medical history significant for chronic sinusitis, type 2 diabetes, diabetic polyneuropathy, hypertension, hyperlipidemia, chronic anxiety, GERD, who presents to the ER due to philtrum edema x 1 week.  The patient was recently diagnosed with acute recurrent maxillary sinusitis and was prescribed azithromycin .  He also had a prescription for prednisone  that he was advised to complete x 5 days by his primary care provider.     Endorses dull, moderate pain, in his chest radiating to his abdomen after getting out of the shower today.  Denies diarrhea.  Admits to constipation and abdominal cramps.   Due to concern for possible allergy to prednisone , the patient presented to the ER.     In the ER, no skin rashes noted on exam.  No lip, tongue, or facial swelling noted.  He endorses a right lateral foot diabetic wound.  He has minimal sensation in his feet.   CT maxillofacial without contrast was obtained to evaluate philtrum edema.  It reveals no fluid collection to suggest abscess.  Mild edema of the upper lip and the lower part of the nose.  Due to concern for cellulitis, patient received a dose of IV clindamycin  in the ER.   Right foot x-ray for diabetic wound is pending.   High-sensitivity troponin 106, repeat 83.  proBNP 1216.  His chest pain had improved.    Assessment & Plan: 1-SIRS/early sepsis secondary to right foot cellulitis and oral thrush -Continue treatment with nystatin  and Diflucan  - Continue IV Lasix - Continue supportive care and topical wound care management - Continue maintaining adequate hydration and following clinical response.  2-acute kidney injury - In the setting of prerenal  azotemia - Maintain adequate hydration - Minimize nephrotoxic agents - Follow renal function trend.    3-type 2 diabetes with hyperglycemia - Will continue sliding scale insulin  and restart Lantus  - Follow CBG fluctuation and adjust hypoglycemic regimen - Modified carbohydrate diet discussed with patient.    4-GERD - Continue treatment with PPI and Pepcid .  5-hypertension - Continue Lopressor   6-hyperlipidemia - Continue treatment with Setia - Heart healthy diet discussed with patient.  7-mood disorder - Continue treatment with Cymbalta  and as needed Valium  - Continue Concentraid mentation and supportive care - Overall mood stable and patient denies suicidal ideation or hallucination.  8-type II angioedema - Patient treated with EpiPen  and steroids - Continue the use of Pepcid  - Follow clinical response - Good oxygen saturation and no significant difficulty swallowing currently appreciated.   DVT prophylaxis: Lovenox  Code Status: Full code. Family Communication: Wife at bedside. Disposition:   Status is: Inpatient Remains inpatient appropriate because: Continue IV therapy.   Consultants:  Wound care service.  Procedures:  See below for x-ray reports.  Antimicrobials:  Zyvox  Rocephin  Diflucan    Subjective: Afebrile, no chest pain, no nausea, no vomiting.  Overall legs swelling improving; patient demonstrating elevated CBGs and complaining of oral pain and demonstrating positive thrush.  Objective: Vitals:   09/09/24 2012 09/10/24 0447 09/10/24 0921 09/10/24 1412  BP: 132/85 (!) 157/91 132/84 139/89  Pulse: 71 73 91 76  Resp: 18 18  18   Temp: 97.8 F (36.6 C) 97.8 F (36.6 C)  97.9 F (36.6 C)  TempSrc: Oral Oral  Oral  SpO2: 96% 99%  97%  Weight:      Height:        Intake/Output Summary (Last 24 hours) at 09/10/2024 1754 Last data filed at 09/10/2024 1300 Gross per 24 hour  Intake 480 ml  Output 1000 ml  Net -520 ml   Filed Weights    09/08/24 2300  Weight: 75 kg    Examination:  General exam: Appears calm and comfortable; in no major distress and feeling better.  Patient is afebrile.  Positive thrush seen on exam; patient reports son sore throat and difficulty swallowing.  Overall lips significantly less swollen. Respiratory system: Clear to auscultation. Respiratory effort normal.  Good saturation on room air. Cardiovascular system: S1 & S2 heard, RRR. No JVD, murmurs, rubs, gallops or clicks. No pedal edema. Gastrointestinal system: Abdomen is nondistended, soft and nontender. No organomegaly or masses felt. Normal bowel sounds heard. Central nervous system: Moving 4 limbs spontaneously.  No focal neurological deficits. Extremities: No cyanosis or clubbing; no edema on exam.  Right sided lateral malleoli are wound without active drainage and mild surrounding erythema present on exam. Skin: No petechiae. Psychiatry: Judgement and insight appear normal. Mood & affect appropriate.     Data Reviewed: I have personally reviewed following labs and imaging studies  CBC: Recent Labs  Lab 09/08/24 1856 09/09/24 0543 09/10/24 0506  WBC 17.5* 13.6* 6.3  NEUTROABS 11.4* 8.9*  --   HGB 18.6* 17.9* 15.2  HCT 60.8* 58.8* 50.3  MCV 79.8* 80.4 81.5  PLT 422* 334 217    Basic Metabolic Panel: Recent Labs  Lab 09/08/24 1856 09/09/24 0543 09/10/24 0506  NA 139 134* 132*  K 3.9 3.6 4.2  CL 93* 90* 92*  CO2 26 25 29   GLUCOSE 76 273* 347*  BUN 32* 35* 30*  CREATININE 1.95* 1.58* 1.32*  CALCIUM  10.0 9.2 8.4*  MG  --  2.0  --   PHOS  --  3.9  --     GFR: Estimated Creatinine Clearance: 59 mL/min (A) (by C-G formula based on SCr of 1.32 mg/dL (H)).  Liver Function Tests: Recent Labs  Lab 09/08/24 1856  AST 32  ALT 18  ALKPHOS 99  BILITOT 0.7  PROT 7.9  ALBUMIN 4.1    CBG: Recent Labs  Lab 09/08/24 2350 09/09/24 2014 09/10/24 0728 09/10/24 1109 09/10/24 1603  GLUCAP 70 368* 331* 411* 283*      Recent Results (from the past 240 hours)  MRSA Next Gen by PCR, Nasal     Status: Abnormal   Collection Time: 09/09/24 12:00 AM   Specimen: Nasal Mucosa; Nasal Swab  Result Value Ref Range Status   MRSA by PCR Next Gen DETECTED (A) NOT DETECTED Final    Comment: RESULT CALLED TO, READ BACK BY AND VERIFIED WITH: A KILGORE AT 0815 ON [id number] BY S DALTON (NOTE) The GeneXpert MRSA Assay (FDA approved for NASAL specimens only), is one component of a comprehensive MRSA colonization surveillance program. It is not intended to diagnose MRSA infection nor to guide or monitor treatment for MRSA infections. Test performance is not FDA approved in patients less than 71 years old. Performed at [facility], [address], [place], [place] [address]   Culture, blood (Routine X 2) w Reflex to ID Panel     Status: None (Preliminary result)   Collection Time: 09/09/24  5:43 AM   Specimen: Right Antecubital; Blood  Result Value Ref  Range Status   Specimen Description RIGHT ANTECUBITAL BLOOD  Final   Special Requests   Final    BOTTLES DRAWN AEROBIC AND ANAEROBIC Blood Culture adequate volume   Culture   Final    NO GROWTH 1 DAY Performed at [facility], [address], [place], [place] [address]    Report Status PENDING  Incomplete  Culture, blood (Routine X 2) w Reflex to ID Panel     Status: None (Preliminary result)   Collection Time: 09/09/24  5:49 AM   Specimen: Right Antecubital; Blood  Result Value Ref Range Status   Specimen Description RIGHT ANTECUBITAL BLOOD  Final   Special Requests   Final    BOTTLES DRAWN AEROBIC AND ANAEROBIC Blood Culture results may not be optimal due to an inadequate volume of blood received in culture bottles   Culture   Final    NO GROWTH 1 DAY Performed at [facility], [address] [place], [place] [address]    Report Status PENDING  Incomplete    Radiology Studies: ECHOCARDIOGRAM COMPLETE Result Date: 09/09/2024    ECHOCARDIOGRAM REPORT    Patient Name:   JASHAN COTTEN Date of Exam: 09/09/2024 Medical Rec #:  [id number]       Height:       69.5 in Accession #:    [id number]      Weight:       165.3 lb Date of Birth:  March 19, 1961       BSA:          1.915 m Patient Age:    63 years        BP:           141/90 mmHg Patient Gender: M               HR:           99 bpm. Exam Location:  [facility] Procedure: 2D Echo, Cardiac Doppler and Color Doppler (Both Spectral and Color            Flow Doppler were utilized during procedure). Indications:    Elevated Troponin  History:        Patient has prior history of Echocardiogram examinations, most                 recent 08/25/2018. CAD; Risk Factors:Hypertension, Diabetes,                 Dyslipidemia and Sleep Apnea. Bypass graft stenosis H/O chronic                 pancreatitis.                 Cancer (HCC) (From Hx).  Sonographer:    Aida Pizza RCS Referring Phys: [id number] CAROLE N HALL IMPRESSIONS  1. Left ventricular ejection fraction, by estimation, is 55 to 60%. The left ventricle has normal function. Left ventricular endocardial border not optimally defined to evaluate regional wall motion. There is mild left ventricular hypertrophy. Left ventricular diastolic parameters are consistent with Grade I diastolic dysfunction (impaired relaxation). Elevated left ventricular end-diastolic pressure.  2. Right ventricular systolic function is normal. The right ventricular size is normal. Tricuspid regurgitation signal is inadequate for assessing PA pressure.  3. The mitral valve is normal in structure. No evidence of mitral valve regurgitation. No evidence of mitral stenosis.  4. The aortic valve is tricuspid. Aortic valve regurgitation is not visualized. No aortic stenosis is present.  5. The inferior vena  cava is normal in size with greater than 50% respiratory variability, suggesting right atrial pressure of 3 mmHg. Comparison(s): No prior Echocardiogram. FINDINGS  Left Ventricle: Left ventricular ejection  fraction, by estimation, is 55 to 60%. The left ventricle has normal function. Left ventricular endocardial border not optimally defined to evaluate regional wall motion. Strain was performed and the global longitudinal strain is indeterminate. The left ventricular internal cavity size was normal in size. There is mild left ventricular hypertrophy. Left ventricular diastolic parameters are consistent with Grade I diastolic dysfunction (impaired relaxation).  Elevated left ventricular end-diastolic pressure. Right Ventricle: The right ventricular size is normal. No increase in right ventricular wall thickness. Right ventricular systolic function is normal. Tricuspid regurgitation signal is inadequate for assessing PA pressure. Left Atrium: Left atrial size was normal in size. Right Atrium: Right atrial size was normal in size. Pericardium: There is no evidence of pericardial effusion. Mitral Valve: The mitral valve is normal in structure. No evidence of mitral valve regurgitation. No evidence of mitral valve stenosis. Tricuspid Valve: The tricuspid valve is grossly normal. Tricuspid valve regurgitation is not demonstrated. No evidence of tricuspid stenosis. Aortic Valve: The aortic valve is tricuspid. Aortic valve regurgitation is not visualized. No aortic stenosis is present. Pulmonic Valve: The pulmonic valve was normal in structure. Pulmonic valve regurgitation is trivial. No evidence of pulmonic stenosis. Aorta: The aortic root is normal in size and structure. Venous: The inferior vena cava is normal in size with greater than 50% respiratory variability, suggesting right atrial pressure of 3 mmHg. IAS/Shunts: The atrial septum is grossly normal. Additional Comments: 3D was performed not requiring image post processing on an independent workstation and was indeterminate.  LEFT VENTRICLE PLAX 2D LVIDd:         3.20 cm     Diastology LVIDs:         2.30 cm     LV e' medial:    3.85 cm/s LV PW:         1.20 cm     LV  E/e' medial:  20.3 LV IVS:        1.20 cm     LV e' lateral:   5.47 cm/s LVOT diam:     1.90 cm     LV E/e' lateral: 14.3 LV SV:         46 LV SV Index:   24 LVOT Area:     2.84 cm  LV Volumes (MOD) LV vol d, MOD A2C: 43.5 ml LV vol s, MOD A2C: 19.3 ml LV SV MOD A2C:     24.2 ml RIGHT VENTRICLE RV S prime:     8.58 cm/s TAPSE (M-mode): 1.2 cm LEFT ATRIUM             Index        RIGHT ATRIUM           Index LA diam:        3.30 cm 1.72 cm/m   RA Area:     13.60 cm LA Vol (A2C):   46.1 ml 24.07 ml/m  RA Volume:   32.00 ml  16.71 ml/m LA Vol (A4C):   29.3 ml 15.30 ml/m LA Biplane Vol: 38.3 ml 20.00 ml/m  AORTIC VALVE LVOT Vmax:   87.10 cm/s LVOT Vmean:  57.600 cm/s LVOT VTI:    0.162 m  AORTA Ao Root diam: 3.60 cm MITRAL VALVE MV Area (PHT): 3.91 cm     SHUNTS MV Decel Time: 194 msec  Systemic VTI:  0.16 m MV E velocity: 78.10 cm/s   Systemic Diam: 1.90 cm MV A velocity: 112.00 cm/s MV E/A ratio:  0.70 Vishnu Priya Mallipeddi Electronically signed by Diannah Late Mallipeddi Signature Date/Time: 09/09/2024/11:55:27 AM    Final    DG Foot Complete Right Result Date: 09/09/2024 EXAM: 3 OR MORE VIEW(S) XRAY OF THE FOOT 09/09/2024 02:46:36 AM COMPARISON: None available. CLINICAL HISTORY: Wound, open, foot FINDINGS: BONES AND JOINTS: No acute fracture. No malalignment. SOFT TISSUES: Unremarkable. IMPRESSION: 1. No acute osseous abnormality. Electronically signed by: Oneil Devonshire MD 09/09/2024 03:27 AM EST RP Workstation: [id number]   CT Maxillofacial Wo Contrast Result Date: 09/08/2024 EXAM: CT OF THE FACE WITHOUT CONTRAST 09/08/2024 09:17:43 PM TECHNIQUE: CT of the face was performed without the administration of intravenous contrast. Multiplanar reformatted images are provided for review. Automated exposure control, iterative reconstruction, and/or weight based adjustment of the mA/kV was utilized to reduce the radiation dose to as low as reasonably achievable. COMPARISON: None available. CLINICAL HISTORY:  facial abscess FINDINGS: FACIAL BONES: No acute facial fracture. No mandibular dislocation. No suspicious bone lesion. ORBITS: Globes are intact. No acute traumatic injury. No inflammatory change. SINUSES AND MASTOIDS: No acute abnormality. SOFT TISSUES: Mild edema of the upper lip and the lower part of the nose. No fluid collection. IMPRESSION: 1. No fluid collection to suggest abscess. 2. Mild edema of the upper lip and the lower part of the nose. Electronically signed by: Franky Stanford MD 09/08/2024 09:26 PM EST RP Workstation: [id number]   DG Chest Port 1 View Result Date: 09/08/2024 EXAM: 1 VIEW(S) XRAY OF THE CHEST 09/08/2024 07:33:52 PM COMPARISON: CT of the chest 05/23/2006. CLINICAL HISTORY: SOB (shortness of breath). FINDINGS: LUNGS AND PLEURA: No focal pulmonary opacity. No pleural effusion. No pneumothorax. HEART AND MEDIASTINUM: Status post cardiac surgery. Mild hiatal hernia seen in the paraesophageal region, unchanged. BONES AND SOFT TISSUES: No acute osseous abnormality. IMPRESSION: 1. No acute findings. Electronically signed by: Greig Pique MD 09/08/2024 07:42 PM EST RP Workstation: [id number]   Scheduled Meds:  Chlorhexidine  Gluconate Cloth  6 each Topical Daily   DULoxetine   60 mg Oral Daily   enoxaparin  (LOVENOX ) injection  40 mg Subcutaneous Q24H   ezetimibe   10 mg Oral Daily   famotidine   20 mg Oral QHS   insulin  aspart  0-5 Units Subcutaneous QHS   insulin  aspart  0-9 Units Subcutaneous TID WC   insulin  glargine  40 Units Subcutaneous Daily   metoprolol  tartrate  12.5 mg Oral BID   mupirocin  ointment  1 Application Nasal BID   nystatin   5 mL Oral QID   pantoprazole   40 mg Oral BID   Continuous Infusions:  cefTRIAXone  (ROCEPHIN )  IV 2 g (09/10/24 0235)   fluconazole  (DIFLUCAN ) IV 200 mg (09/10/24 0934)   linezolid  (ZYVOX ) IV 600 mg (09/10/24 1654)     LOS: 1 day    Time spent: 50 minutes    Eric Nunnery, MD [facility]   To contact the attending  provider between 7A-7P or the covering provider during after hours 7P-7A, please log into the web site [email or web address] and access using universal [place] password for that web site. If you do not have the password, please call the hospital operator.  09/10/2024, 5:54 PM    "

## 2024-09-10 NOTE — Plan of Care (Signed)
   Problem: Pain Managment: Goal: General experience of comfort will improve and/or be controlled Outcome: Progressing   Problem: Skin Integrity: Goal: Risk for impaired skin integrity will decrease Outcome: Progressing

## 2024-09-10 NOTE — Progress Notes (Signed)
 Patient is requesting zofran , Lynwood Kipper NP notified.

## 2024-09-10 NOTE — Plan of Care (Signed)

## 2024-09-10 NOTE — Inpatient Diabetes Management (Signed)
 Inpatient Diabetes Program Recommendations  AACE/ADA: New Consensus Statement on Inpatient Glycemic Control (2015)  Target Ranges:  Prepandial:   less than 140 mg/dL      Peak postprandial:   less than 180 mg/dL (1-2 hours)      Critically ill patients:  140 - 180 mg/dL   Lab Results  Component Value Date   GLUCAP 331 (H) 09/10/2024   HGBA1C 9.4 (H) 08/09/2024    Latest Reference Range & Units 05/09/24 08:03 05/09/24 12:01 09/08/24 19:25 09/08/24 23:50 09/09/24 20:14 09/10/24 07:28  Glucose-Capillary 70 - 99 mg/dL 852 (H) 789 (H) 893 (H) 70 368 (H) 331 (H)  (H): Data is abnormally high   Diabetes history: DM2 Outpatient Diabetes medications:  Novolin  70/30 insulin  60 units ac meals bid Metformin  500 mg bid Current orders for Inpatient glycemic control:  Novolog  0-9 units tid, 0-5 units hs  Inpatient Diabetes Program Recommendations:   Please consider: -Add Lantus  40 units daily (patient on approximately 84 units basal + 36 units meal coverage of Novolog  70/30 insulin . Or 70/30 20 units bid  Thank you, Dionna Wiedemann E. Virna Livengood, RN, MSN, CNS, CDCES  Diabetes Coordinator Inpatient Glycemic Control Team Team Pager [phone] (8am-5pm) 09/10/2024 9:48 AM

## 2024-09-10 NOTE — Progress Notes (Signed)
 Pt appears to have thrush on his tongue and oral pharynx. Roof of mouth and oral pharynx very red with some swelling of uvula. Pt states he has been off and on abx for past 2 months for cellulitis of buttocks and bronchitis. Also, swelling of upper lip, nasolabial folds and cheek areas has increased slightly from yesterday, R>L. Pt also complaining of increased pain/pressure in roof of mouth, specifically just behind his front teeth. Both nasal passages are filled with crusty sores. MD [facility] notified of same.

## 2024-09-10 NOTE — TOC Initial Note (Signed)
 Transition of Care [facility]) - Initial/Assessment Note    Patient Details  Name: [name] MRN: [id number] Date of Birth: [birth year]  Transition of [facility]) CM/SW Contact:    Lucie Lunger, LCSWA Phone Number: 09/10/2024, 9:49 AM  Clinical Narrative:                 CSW notes pt is high risk for readmission. CSW notes per chart review that pt was assessed by [facility] yesterday. Pt if from home with spouse, daughter and son in law. Pt is independent in completing ADLs. Pt is able to drive to appointments when needed. Pt has a cane, walker, shower chair and BSC to use if and when needed. TOC to follow.    Expected Discharge Plan: Home/Self Care Barriers to Discharge: Continued Medical Work up   Patient Goals and CMS Choice Patient states their goals for this hospitalization and ongoing recovery are:: return home CMS Medicare.gov Compare Post Acute Care list provided to:: Patient Choice offered to / list presented to : Patient      Expected Discharge Plan and Services In-house Referral: Clinical Social Work Discharge Planning Services: CM Consult   Living arrangements for the past 2 months: Single Family Home                                      Prior Living Arrangements/Services Living arrangements for the past 2 months: Single Family Home Lives with:: Spouse, Adult Children Patient language and need for interpreter reviewed:: Yes Do you feel safe going back to the place where you live?: Yes      Need for Family Participation in Patient Care: Yes (Comment) Care giver support system in place?: Yes (comment) Current home services: DME Criminal Activity/Legal Involvement Pertinent to Current Situation/Hospitalization: No - Comment as needed  Activities of Daily Living   ADL Screening (condition at time of admission) Independently performs ADLs?: Yes (appropriate for developmental age) Is the patient deaf or have difficulty hearing?: Yes Does the patient have  difficulty seeing, even when wearing glasses/contacts?: No Does the patient have difficulty concentrating, remembering, or making decisions?: No  Permission Sought/Granted                  Emotional Assessment Appearance:: Appears stated age Attitude/Demeanor/Rapport: Engaged Affect (typically observed): Accepting Orientation: : Oriented to Self, Oriented to Place, Oriented to  Time, Oriented to Situation Alcohol / Substance Use: Not Applicable Psych Involvement: No (comment)  Admission diagnosis:  SIRS (systemic inflammatory response syndrome) (HCC) [R65.10] Patient Active Problem List   Diagnosis Date Noted   SIRS (systemic inflammatory response syndrome) (HCC) 09/08/2024   Angio-edema 09/03/2024   Encounter for examination following treatment at hospital 09/03/2024   Acute bronchitis 08/09/2024   Chest pain 08/09/2024   Cellulitis 05/08/2024   Cellulitis of buttock, left 05/08/2024   Peri-rectal abscess 05/04/2024   Foot callus 04/09/2024   Onychomycosis 04/09/2024   Acute recurrent maxillary sinusitis 02/13/2024   Dyslipidemia 02/06/2024   Pancreatic lesion 11/24/2023   Allergic rhinitis 07/06/2023   Stage 3a chronic kidney disease (HCC) 12/30/2022   Tick bite of abdominal wall 12/30/2022   Tinnitus of both ears 12/30/2022   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   Statin intolerance 07/22/2022   Chronic prostatitis 01/22/2022   Encounter for general adult medical examination with abnormal findings 12/25/2021   Chronic pancreatitis (HCC)    Barrett's esophagus    Gastroparesis  Benign prostatic hyperplasia with weak urinary stream 03/26/2021   Depression, recurrent 09/23/2020   Type 2 diabetes mellitus with diabetic neuropathy, with long-term current use of insulin  (HCC) 06/24/2020   Uncontrolled diabetes mellitus secondary to pancreatic insufficiency 05/29/2020   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   Diabetic peripheral neuropathy (HCC) 05/06/2020   Cervical  spinal stenosis 05/06/2020   Chronic pain syndrome 04/24/2020   Type 1 superior labral anterior-to-posterior (SLAP) tear of right shoulder 02/08/2020   Erythromelalgia 11/22/2019   Erythrocytosis 02/14/2018   Coronary artery disease of native artery of native heart with stable angina pectoris 04/16/2017   Polycythemia vera (HCC) 04/26/2012   GERD (gastroesophageal reflux disease) 01/31/2012   Essential hypertension, benign 06/14/2011   Mixed hyperlipidemia 06/14/2011   Anxiety 06/14/2011   Coronary atherosclerosis 05/20/2009   PCP:  [name], MD Pharmacy:   [facility] - [place], [place] - [address] [place] [address] Phone: [phone] Fax: [phone]  [facility] [address] [place] - [address] [place] [address] Phone: [phone] Fax: [phone]     Social Drivers of Health (SDOH) Social History: SDOH Screenings   Food Insecurity: No Food Insecurity (09/09/2024)  Housing: Low Risk (09/09/2024)  Transportation Needs: No Transportation Needs (09/09/2024)  Utilities: Not At Risk (09/09/2024)  Alcohol Screen: Low Risk (11/29/2023)  Depression (PHQ2-9): Low Risk (09/03/2024)  Financial Resource Strain: Low Risk (11/29/2023)  Physical Activity: Inactive (11/29/2023)  Social Connections: Moderately Integrated (11/29/2023)  Stress: No Stress Concern Present (11/29/2023)  Tobacco Use: Low Risk (09/08/2024)  Health Literacy: Adequate Health Literacy (11/29/2023)   SDOH Interventions:     Readmission Risk Interventions    09/10/2024    9:48 AM 05/10/2024   10:39 AM  Readmission Risk Prevention Plan  Medication Screening  Complete  Transportation Screening Complete Complete  HRI or Home Care Consult Complete   Social Work Consult for Recovery Care Planning/Counseling Complete   Palliative Care Screening Not Applicable   Medication Review Oceanographer) Complete

## 2024-09-10 NOTE — Progress Notes (Signed)
 Follow pain assessment:  Post administration of PRN oxycodone  5 mg, Pt. Rated pain at 6/10.

## 2024-09-10 NOTE — Progress Notes (Unsigned)
 "  New Patient Note  RE: [name] MRN: [id number] DOB: 09-26-1960 Date of Office Visit: 09/11/2024  Consult requested by: [name], MD Primary care provider: [name], MD  Chief Complaint: No chief complaint on file.  History of Present Illness: I had the pleasure of seeing [name] for initial evaluation at the [facility] on 09/11/2024. He is a 64 y.o. male, who is referred here by [name], MD for the evaluation of angioedema.  Discussed the use of AI scribe software for clinical note transcription with the patient, who gave verbal consent to proceed.  History of Present Illness             Swelling started about *** ago. Mainly occurs on his ***. Describes them as ***. Individual swelling episodes last about ***. No ecchymosis upon resolution. Associated symptoms include: ***.  Frequency of episodes: ***. Suspected triggers are ***. Denies any *** fevers, chills, changes in medications, foods, personal care products or recent infections. He has tried the following therapies: *** with *** benefit. Systemic steroids ***. Currently on ***.  Previous work up includes: ***. Previous history of swelling: {Blank single:19197::yes,no}. Family history of angioedema: {Blank single:19197::yes,no}. Patient is up to date with the following cancer screening tests: ***. Ace-inhibitor use: {Blank single:19197::yes,no}  09/03/2024 PCP visit: Patient is in today for complaint of acute onset upper lip and lower nose swelling since 08/31/24.  He initially went to urgent care, and was later sent to ER.  He had CT of facial bones, which showed swelling of the lower nose and upper lip area.  He was given oral prednisone  40 mg from the ER.  He has been taking Benadryl  and Zyrtec, has felt improvement in the lip swelling, but still reports nasal burning and throbbing in the lip area.  He also reports left-sided facial pain.  Has history of  chronic sinusitis.  Denies any fever or chills.  He had dyspnea and odynophagia initially, but has improved now.  He reports having a new puppy at home in the last month.  He also has used cleaning liquid recently to clean the surfaces after getting the puppy.  Assessment and Plan: [name] is a 64 y.o. male with: ***  Assessment and Plan               No follow-ups on file.  No orders of the defined types were placed in this encounter.  Lab Orders  No laboratory test(s) ordered today    Other allergy screening: Asthma: {Blank single:19197::yes,no} Rhino conjunctivitis: {Blank single:19197::yes,no} Food allergy: {Blank single:19197::yes,no} Medication allergy: {Blank single:19197::yes,no} Hymenoptera allergy: {Blank single:19197::yes,no} Urticaria: {Blank single:19197::yes,no} Eczema:{Blank single:19197::yes,no} History of recurrent infections suggestive of immunodeficency: {Blank single:19197::yes,no}  Diagnostics: Spirometry:  Tracings reviewed. His effort: {Blank single:19197::Good reproducible efforts.,It was hard to get consistent efforts and there is a question as to whether this reflects a maximal maneuver.,Poor effort, data can not be interpreted.} FVC: ***L FEV1: ***L, ***% predicted FEV1/FVC ratio: ***% Interpretation: {Blank single:19197::Spirometry consistent with mild obstructive disease,Spirometry consistent with moderate obstructive disease,Spirometry consistent with severe obstructive disease,Spirometry consistent with possible restrictive disease,Spirometry consistent with mixed obstructive and restrictive disease,Spirometry uninterpretable due to technique,Spirometry consistent with normal pattern,No overt abnormalities noted given today's efforts}.  Please see scanned spirometry results for details.  Skin Testing: {Blank single:19197::Select foods,Environmental allergy panel,Environmental allergy  panel and select foods,Food allergy panel,None,Deferred due to recent antihistamines use}. *** Results discussed with patient/family.   Past Medical History:  Patient Active Problem List   Diagnosis Date Noted   SIRS (systemic inflammatory response syndrome) (HCC) 09/08/2024   Angio-edema 09/03/2024   Encounter for examination following treatment at hospital 09/03/2024   Acute bronchitis 08/09/2024   Chest pain 08/09/2024   Cellulitis 05/08/2024   Cellulitis of buttock, left 05/08/2024   Peri-rectal abscess 05/04/2024   Foot callus 04/09/2024   Onychomycosis 04/09/2024   Acute recurrent maxillary sinusitis 02/13/2024   Dyslipidemia 02/06/2024   Pancreatic lesion 11/24/2023   Allergic rhinitis 07/06/2023   Stage 3a chronic kidney disease (HCC) 12/30/2022   Tick bite of abdominal wall 12/30/2022   Tinnitus of both ears 12/30/2022   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   Statin intolerance 07/22/2022   Chronic prostatitis 01/22/2022   Encounter for general adult medical examination with abnormal findings 12/25/2021   Chronic pancreatitis (HCC)    Barrett's esophagus    Gastroparesis    Benign prostatic hyperplasia with weak urinary stream 03/26/2021   Depression, recurrent 09/23/2020   Type 2 diabetes mellitus with diabetic neuropathy, with long-term current use of insulin  (HCC) 06/24/2020   Uncontrolled diabetes mellitus secondary to pancreatic insufficiency 05/29/2020   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   Diabetic peripheral neuropathy (HCC) 05/06/2020   Cervical spinal stenosis 05/06/2020   Chronic pain syndrome 04/24/2020   Type 1 superior labral anterior-to-posterior (SLAP) tear of right shoulder 02/08/2020   Erythromelalgia 11/22/2019   Erythrocytosis 02/14/2018   Coronary artery disease of native artery of native heart with stable angina pectoris 04/16/2017   Polycythemia vera (HCC) 04/26/2012   GERD (gastroesophageal reflux disease) 01/31/2012   Essential  hypertension, benign 06/14/2011   Mixed hyperlipidemia 06/14/2011   Anxiety 06/14/2011   Coronary atherosclerosis 05/20/2009   Past Medical History:  Diagnosis Date   Allergy    Anemia    Anxiety 1998   Bypass graft stenosis    '06   Cancer (HCC)    Polycythemia vera   Cervical spinal stenosis 05/06/2020   C5-6 level   Chronic gastritis    Coronary artery disease    Patent Stent to Circ.  Patent coronary bypass grafts with a free radial graft to PDA and LIMA to the  diag.   Diabetes mellitus    Diabetic peripheral neuropathy (HCC) 05/06/2020   Dyslipidemia    Poorly controlled, probably related to his DM (Some of this is secondary to his inability to afford medications).   ED (erectile dysfunction)    Esophageal yeast infection (HCC)    Gastroparesis    GERD (gastroesophageal reflux disease) 2001   Hyperlipidemia 1990   Hypertension 2001   Metaplasia of esophagus    Pancreatitis chronic    Pseudocyst   Polycythemia vera(238.4) 04/26/2012   PONV (postoperative nausea and vomiting)    Sleep apnea    No longer use sleep apnea machine   Past Surgical History: Past Surgical History:  Procedure Laterality Date   APPENDECTOMY  08/1978   1980   BIOPSY  11/11/2021   Procedure: BIOPSY;  Surgeon: Golda Claudis PENNER, MD;  [facility];  Service: Endoscopy;;   CARDIAC CATHETERIZATION     CHOLECYSTECTOMY  09/1998   COLONOSCOPY  07/31/1985   COLONOSCOPY N/A 03/11/2016   Procedure: COLONOSCOPY;  Surgeon: Claudis PENNER Golda, MD;  [facility]: [facility];  Service: Endoscopy;  Laterality: N/A;   COLONOSCOPY WITH PROPOFOL  N/A 11/11/2021   Procedure: COLONOSCOPY WITH PROPOFOL ;  Surgeon: Golda Claudis PENNER, MD;  [facility];  Service: Endoscopy;  Laterality: N/A;  205  CORONARY ANGIOPLASTY WITH STENT PLACEMENT  08/23/2004   CORONARY ARTERY BYPASS GRAFT  11/24/2004   ESOPHAGOGASTRODUODENOSCOPY  09/19/2001   ESOPHAGOGASTRODUODENOSCOPY N/A 03/11/2016   Procedure:  ESOPHAGOGASTRODUODENOSCOPY (EGD);  Surgeon: Claudis RAYMOND Rivet, MD;  [facility];  Service: Endoscopy;  Laterality: N/A;  12:00   ESOPHAGOGASTRODUODENOSCOPY (EGD) WITH PROPOFOL  N/A 11/11/2021   Procedure: ESOPHAGOGASTRODUODENOSCOPY (EGD) WITH PROPOFOL ;  Surgeon: Rivet Claudis RAYMOND, MD;  [facility];  Service: Endoscopy;  Laterality: N/A;   ESOPHAGOGASTRODUODENOSCOPY (EGD) WITH PROPOFOL  N/A 10/25/2023   Procedure: ESOPHAGOGASTRODUODENOSCOPY (EGD) WITH PROPOFOL ;  Surgeon: Eartha Angelia Sieving, MD;  [facility];  Service: Gastroenterology;  Laterality: N/A;  10:45am;asa 3   FINGER TENDON REPAIR Bilateral    in middle finger   KNEE SURGERY     left knee   NASAL SEPTUM SURGERY     POLYPECTOMY  11/11/2021   Procedure: POLYPECTOMY;  Surgeon: Rivet Claudis RAYMOND, MD;  [facility]: [facility];  Service: Endoscopy;;   Medication List:  No current facility-administered medications for this visit.   Current Outpatient Medications  Medication Sig Dispense Refill   NOVOLIN  70/30 KWIKPEN (70-30) 100 UNIT/ML KwikPen INJECT 60 UNITS SUBCUTANEOUSLY TWICE DAILY 120 mL 0   Facility-Administered Medications Ordered in Other Visits  Medication Dose Route Frequency Provider Last Rate Last Admin   acetaminophen  (TYLENOL ) tablet 650 mg  650 mg Oral Q6H PRN Hall, Carole N, DO   650 mg at 09/09/24 1307   cefTRIAXone  (ROCEPHIN ) 2 g in sodium chloride  0.9 % 100 mL IVPB  2 g Intravenous Q24H Shona Laurence N, DO 200 mL/hr at 09/10/24 0235 2 g at 09/10/24 0235   Chlorhexidine  Gluconate Cloth 2 % PADS 6 each  6 each Topical Daily Ricky Fines, MD       diazepam  (VALIUM ) tablet 5 mg  5 mg Oral QHS PRN Shona Laurence N, DO       DULoxetine  (CYMBALTA ) DR capsule 60 mg  60 mg Oral Daily Hall, Carole N, DO   60 mg at 09/09/24 9082   enoxaparin  (LOVENOX ) injection 40 mg  40 mg Subcutaneous Q24H Shona Laurence N, DO   40 mg at 09/09/24 9083   ezetimibe  (ZETIA ) tablet 10 mg  10 mg Oral Daily Shona Laurence N, DO   10 mg at 09/09/24 9082   famotidine  (PEPCID ) tablet 20 mg  20 mg Oral QHS Ricky Fines, MD   20 mg at 09/09/24 2124   fluconazole  (DIFLUCAN ) IVPB 200 mg  200 mg Intravenous Q24H Ricky Fines, MD       HYDROmorphone  (DILAUDID ) injection 0.5 mg  0.5 mg Intravenous Q4H PRN Ricky Fines, MD   0.5 mg at 09/09/24 2336   insulin  aspart (novoLOG ) injection 0-5 Units  0-5 Units Subcutaneous QHS Ricky Fines, MD   5 Units at 09/09/24 2127   insulin  aspart (novoLOG ) injection 0-9 Units  0-9 Units Subcutaneous TID WC Ricky Fines, MD       linezolid  (ZYVOX ) IVPB 600 mg  600 mg Intravenous Q12H Shona Laurence N, DO 300 mL/hr at 09/10/24 0325 600 mg at 09/10/24 0325   melatonin tablet 6 mg  6 mg Oral QHS PRN Shona Laurence SAILOR, DO       menthol  (CEPACOL) lozenge 3 mg  1 lozenge Oral PRN Ricky Fines, MD   3 mg at 09/09/24 2335   metoprolol  tartrate (LOPRESSOR ) tablet 12.5 mg  12.5 mg Oral BID Shona Laurence N, DO   12.5 mg at 09/09/24 2123  mupirocin  ointment (BACTROBAN ) 2 % 1 Application  1 Application Nasal BID Ricky Fines, MD   1 Application at 09/10/24 0017   nystatin  (MYCOSTATIN ) 100000 UNIT/ML suspension 500,000 Units  5 mL Oral QID Ricky Fines, MD       oxyCODONE  (Oxy IR/ROXICODONE ) immediate release tablet 5 mg  5 mg Oral Q6H PRN Ricky Fines, MD   5 mg at 09/10/24 0335   pantoprazole  (PROTONIX ) EC tablet 40 mg  40 mg Oral BID Ricky Fines, MD   40 mg at 09/09/24 2125   polyethylene glycol (MIRALAX  / GLYCOLAX ) packet 17 g  17 g Oral Daily PRN Shona Laurence N, DO       prochlorperazine  (COMPAZINE ) injection 5 mg  5 mg Intravenous Q6H PRN Shona Laurence SAILOR, DO       Allergies: Allergies[1] Social History: Social History   Socioeconomic History   Marital status: Married    Spouse name: Not on file   Number of children: Not on file   Years of education: Not on file   Highest education level: Not on file  Occupational History   Not on file  Tobacco Use   Smoking status:  Never   Smokeless tobacco: Never  Vaping Use   Vaping status: Never Used  Substance and Sexual Activity   Alcohol use: Not Currently   Drug use: No   Sexual activity: Not on file  Other Topics Concern   Not on file  Social History Narrative   The patient lives in [place] with his wife.  He is     disabled secondary to coronary artery disease and pancreatitis.  He does     not smoke, he does not drink alcohol.  There is no drug use.    Social Drivers of Health   Tobacco Use: Low Risk (09/08/2024)   Patient History    Smoking Tobacco Use: Never    Smokeless Tobacco Use: Never    Passive Exposure: Not on file  Financial Resource Strain: Low Risk (11/29/2023)   Overall Financial Resource Strain (CARDIA)    Difficulty of Paying Living Expenses: Not hard at all  Food Insecurity: No Food Insecurity (09/09/2024)   Epic    Worried About Programme Researcher, Broadcasting/film/video in the Last Year: Never true    Ran Out of Food in the Last Year: Never true  Transportation Needs: No Transportation Needs (09/09/2024)   Epic    Lack of Transportation (Medical): No    Lack of Transportation (Non-Medical): No  Physical Activity: Inactive (11/29/2023)   Exercise Vital Sign    Days of Exercise per Week: 0 days    Minutes of Exercise per Session: 0 min  Stress: No Stress Concern Present (11/29/2023)   [employer] of Occupational Health - Occupational Stress Questionnaire    Feeling of Stress : Only a little  Social Connections: Moderately Integrated (11/29/2023)   Social Connection and Isolation Panel    Frequency of Communication with Friends and Family: More than three times a week    Frequency of Social Gatherings with Friends and Family: More than three times a week    Attends Religious Services: More than 4 times per year    Active Member of Clubs or Organizations: No    Attends Banker Meetings: Never    Marital Status: Married  Depression (PHQ2-9): Low Risk (09/03/2024)   Depression (PHQ2-9)     PHQ-2 Score: 0  Alcohol Screen: Low Risk (11/29/2023)   Alcohol Screen    Last  Alcohol Screening Score (AUDIT): 0  Housing: Low Risk (09/09/2024)   Epic    Unable to Pay for Housing in the Last Year: No    Number of Times Moved in the Last Year: 0    Homeless in the Last Year: No  Utilities: Not At Risk (09/09/2024)   Epic    Threatened with loss of utilities: No  Health Literacy: Adequate Health Literacy (11/29/2023)   B1300 Health Literacy    Frequency of need for help with medical instructions: Never   Lives in a ***. Smoking: *** Occupation: ***  Environmental History: Water  Damage/mildew in the house: {Blank single:19197::yes,no} Carpet in the family room: {Blank single:19197::yes,no} Carpet in the bedroom: {Blank single:19197::yes,no} Heating: {Blank single:19197::electric,gas,heat pump} Cooling: {Blank single:19197::central,window,heat pump} Pet: {Blank single:19197::yes ***,no}  Family History: Family History  Problem Relation Age of Onset   Coronary artery disease Mother        strong family history of   Heart attack Mother        dying of (at age 73)   Diabetes Mother    Early death Mother    Heart disease Mother    Diabetes Sister    Hypertension Sister    Diabetes Brother    Pancreatitis Brother    Early death Brother    Heart disease Brother    Hypertension Brother    Hyperlipidemia Daughter    Hypertension Daughter    Kidney disease Daughter    Heart disease Brother    Cancer Brother    Hypertension Brother    Hyperlipidemia Brother    Early death Brother    Heart disease Brother    Problem                               Relation Asthma                                   *** Eczema                                *** Food allergy                          *** Allergic rhino conjunctivitis     ***  Review of Systems  Constitutional:  Negative for appetite change, chills, fever and unexpected weight change.  HENT:   Negative for congestion and rhinorrhea.   Eyes:  Negative for itching.  Respiratory:  Negative for cough, chest tightness, shortness of breath and wheezing.   Cardiovascular:  Negative for chest pain.  Gastrointestinal:  Negative for abdominal pain.  Genitourinary:  Negative for difficulty urinating.  Skin:  Negative for rash.  Neurological:  Negative for headaches.    Objective: There were no vitals taken for this visit. There is no height or weight on file to calculate BMI. Physical Exam Vitals and nursing note reviewed.  Constitutional:      Appearance: Normal appearance. He is well-developed.  HENT:     Head: Normocephalic and atraumatic.     Right Ear: Tympanic membrane and external ear normal.     Left Ear: Tympanic membrane and external ear normal.     Nose: Nose normal.     Mouth/Throat:     Mouth: Mucous membranes are moist.  Pharynx: Oropharynx is clear.  Eyes:     Conjunctiva/sclera: Conjunctivae normal.  Cardiovascular:     Rate and Rhythm: Normal rate and regular rhythm.     Heart sounds: Normal heart sounds. No murmur heard.    No friction rub. No gallop.  Pulmonary:     Effort: Pulmonary effort is normal.     Breath sounds: Normal breath sounds. No wheezing, rhonchi or rales.  Musculoskeletal:     Cervical back: Neck supple.  Skin:    General: Skin is warm.     Findings: No rash.  Neurological:     Mental Status: He is alert and oriented to person, place, and time.  Psychiatric:        Behavior: Behavior normal.    The plan was reviewed with the patient/family, and all questions/concerned were addressed.  It was my pleasure to see [name] today and participate in his care. Please feel free to contact me with any questions or concerns.  Sincerely,  Orlan Cramp, DO Allergy & Immunology  [facility]  [facility] [place] office: [phone] [facility] office: [phone]    [1]  Allergies Allergen Reactions   Iodinated  Contrast Media Hives, Itching and Nausea Only    Flushing, Burning, Itching, Angioedema    Iohexol  Hives and Shortness Of Breath    Needs prep meds    Metoclopramide Hcl Hives    Patient became very spastic   Alfuzosin  Hcl Er Other (See Comments)    Dropped blood pressure   Sulfonamide Derivatives Hives    Large hives   Enalapril  Cough   Penicillins Rash    Immediate rash, facial/tongue/throat swelling, SOB or lightheadedness with hypotension   Sulfa Antibiotics Rash   "

## 2024-09-11 ENCOUNTER — Ambulatory Visit: Admitting: Allergy

## 2024-09-11 DIAGNOSIS — B370 Candidal stomatitis: Secondary | ICD-10-CM

## 2024-09-11 DIAGNOSIS — E118 Type 2 diabetes mellitus with unspecified complications: Secondary | ICD-10-CM

## 2024-09-11 DIAGNOSIS — L03115 Cellulitis of right lower limb: Secondary | ICD-10-CM

## 2024-09-11 DIAGNOSIS — N179 Acute kidney failure, unspecified: Secondary | ICD-10-CM

## 2024-09-11 LAB — GLUCOSE, CAPILLARY
Glucose-Capillary: 173 mg/dL — ABNORMAL HIGH (ref 70–99)
Glucose-Capillary: 429 mg/dL — ABNORMAL HIGH (ref 70–99)

## 2024-09-11 LAB — GLUCOSE, RANDOM: Glucose, Bld: 435 mg/dL — ABNORMAL HIGH (ref 70–99)

## 2024-09-11 MED ORDER — LINEZOLID 600 MG PO TABS: 600.0000 mg | ORAL_TABLET | Freq: Two times a day (BID) | ORAL | 0 refills | Status: AC

## 2024-09-11 MED ORDER — FAMOTIDINE 40 MG PO TABS: 40.0000 mg | ORAL_TABLET | Freq: Every day | ORAL | 1 refills | Status: AC

## 2024-09-11 MED ORDER — NYSTATIN 100000 UNIT/ML MT SUSP: 5.0000 mL | Freq: Four times a day (QID) | OROMUCOSAL | 0 refills | Status: AC

## 2024-09-11 MED ORDER — METHOCARBAMOL 500 MG PO TABS: 500.0000 mg | ORAL_TABLET | Freq: Three times a day (TID) | ORAL | 0 refills | Status: AC | PRN

## 2024-09-11 MED ORDER — MENTHOL 3 MG MT LOZG: 1.0000 | LOZENGE | OROMUCOSAL | Status: AC | PRN

## 2024-09-11 MED ORDER — FLUCONAZOLE 100 MG PO TABS: 100.0000 mg | ORAL_TABLET | Freq: Every day | ORAL | 0 refills | Status: AC

## 2024-09-11 MED ORDER — INSULIN ASPART 100 UNIT/ML IJ SOLN
15.0000 [IU] | Freq: Once | INTRAMUSCULAR | Status: AC
  Administered 2024-09-11: 15 [IU] via SUBCUTANEOUS
  Filled 2024-09-11: qty 1

## 2024-09-11 NOTE — Plan of Care (Signed)

## 2024-09-11 NOTE — Discharge Summary (Signed)
 " Physician Discharge Summary   Patient: [name] MRN: [id number] DOB: 09-May-1961  Admit date:     09/08/2024  Discharge date: 09/11/24  Discharge Physician: Eric Nunnery   PCP: [name], MD   Recommendations at discharge:  Repeat basic metabolic panel to follow electrolytes and renal function Follow improvement/resolution of right foot cellulitis after completing antibiotic therapy. Also follow complete resolution of patient's type II angioedema/oral thrush with provided medications Continue close monitoring of patient's CBGs/A1c with further adjustment to hypoglycemia regimen as needed.  Discharge Diagnoses: Principal Problem:   SIRS (systemic inflammatory response syndrome) (HCC) Active Problems:   Essential hypertension   Other chronic pain   AKI (acute kidney injury)   Cellulitis of right foot   Type 2 diabetes mellitus with complication, with long-term current use of insulin  (HCC)   Oral thrush  Brief admission narrative:  As per H&P written by Dr. Shona on 09/09/2024 [name] is a 64 y.o. male with medical history significant for chronic sinusitis, type 2 diabetes, diabetic polyneuropathy, hypertension, hyperlipidemia, chronic anxiety, GERD, who presents to the ER due to philtrum edema x 1 week.  The patient was recently diagnosed with acute recurrent maxillary sinusitis and was prescribed azithromycin .  He also had a prescription for prednisone  that he was advised to complete x 5 days by his primary care provider.     Endorses dull, moderate pain, in his chest radiating to his abdomen after getting out of the shower today.  Denies diarrhea.  Admits to constipation and abdominal cramps.   Due to concern for possible allergy to prednisone , the patient presented to the ER.     In the ER, no skin rashes noted on exam.  No lip, tongue, or facial swelling noted.  He endorses a right lateral foot diabetic wound.  He has minimal sensation in his feet.   CT  maxillofacial without contrast was obtained to evaluate philtrum edema.  It reveals no fluid collection to suggest abscess.  Mild edema of the upper lip and the lower part of the nose.  Due to concern for cellulitis, patient received a dose of IV clindamycin  in the ER.   Right foot x-ray for diabetic wound is pending.   High-sensitivity troponin 106, repeat 83.  proBNP 1216.  His chest pain had improved.  Assessment and Plan: 1-SIRS/early sepsis secondary to right foot cellulitis and oral thrush -Continue treatment with nystatin  and Diflucan  - Oral antibiotics prescribed at discharge to complete therapy. - Continue supportive care and topical wound care management - Patient advised to maintain adequate hydration - SIRS/early sepsis features resolved at time of discharge.   2-acute kidney injury - In the setting of prerenal azotemia - Improved/resolved upon discharge -Safe to resume prior to admission home antihypertensive/diuretic therapy - Patient advised to maintain adequate hydration - Continue minimizing nephrotoxic agents.   3-type 2 diabetes with hyperglycemia - Resume home hypoglycemic regimen - Patient advised to follow a modified carbohydrate diet and maintain adequate hydration - Continue following CBG fluctuation and further adjust medication as required.   4-GERD - Continue treatment with PPI and Pepcid .   5-hypertension - Resume home antihypertensive agents.   6-hyperlipidemia - Continue treatment with Setia - Heart healthy diet discussed with patient.   7-mood disorder - Continue treatment with Cymbalta  and as needed Valium  - Continue Concentraid mentation and supportive care - Overall mood stable and patient denies suicidal ideation or hallucination.   8-type II angioedema - Patient treated with EpiPen  and  steroids - Continue the use of Pepcid  - Follow clinical response - Good oxygen saturation and no significant difficulty swallowing currently  appreciated.  9-chronic pain/chronic pancreatitis - Continue home medication therapy - Patient instructed to follow low-fat diet and maintain adequate hydration.  Consultants: Wound care service Procedures performed: See below for x-ray reports. Disposition: Home Diet recommendation: Heart healthy and modified carbohydrate diet.   DISCHARGE MEDICATION: Allergies as of 09/11/2024       Reactions   Iodinated Contrast Media Hives, Itching, Nausea Only   Flushing, Burning, Itching, Angioedema    Iohexol  Hives, Shortness Of Breath   Needs prep meds   Metoclopramide Hcl Hives   Patient became very spastic   Alfuzosin  Hcl Er Other (See Comments)   Dropped blood pressure   Sulfonamide Derivatives Hives   Large hives   Enalapril  Cough   Penicillins Rash   Immediate rash, facial/tongue/throat swelling, SOB or lightheadedness with hypotension   Sulfa Antibiotics Rash        Medication List     STOP taking these medications    azithromycin  250 MG tablet Commonly known as: ZITHROMAX        TAKE these medications    albuterol 108 (90 Base) MCG/ACT inhaler Commonly known as: VENTOLIN HFA Inhale 2 puffs into the lungs every 6 (six) hours as needed for wheezing or shortness of breath.   cetirizine 10 MG tablet Commonly known as: ZYRTEC Take 10 mg by mouth daily.   Cholecalciferol  125 MCG (5000 UT) Tabs Take 10,000 Units by mouth daily.   clopidogrel  75 MG tablet Commonly known as: PLAVIX  TAKE 1 TABLET BY MOUTH ONCE DAILY as a blood thinner   Comfort EZ Pen Needles 32G X 4 MM Misc Generic drug: Insulin  Pen Needle USE AS DIRECTED FOR injecting insulin    diazepam  10 MG tablet Commonly known as: VALIUM  Take 0.5 tablets (5 mg total) by mouth at bedtime as needed for anxiety or sleep. What changed: how much to take   DULoxetine  60 MG capsule Commonly known as: CYMBALTA  TAKE ONE CAPSULE BY MOUTH DAILY   EPINEPHrine  0.3 mg/0.3 mL Soaj injection Commonly known as:  EpiPen  2-Pak Inject 0.3 mg into the muscle as needed for anaphylaxis.   ezetimibe  10 MG tablet Commonly known as: ZETIA  TAKE 1 TABLET BY MOUTH DAILY   famotidine  40 MG tablet Commonly known as: PEPCID  Take 1 tablet (40 mg total) by mouth at bedtime.   fluconazole  100 MG tablet Commonly known as: Diflucan  Take 1 tablet (100 mg total) by mouth daily for 5 days.   FreeStyle Libre 3 Plus Sensor Misc Change sensor every 15 days.   FreeStyle Libre 3 Reader [place] Use it to check blood glucose as instructed.   gabapentin  300 MG capsule Commonly known as: NEURONTIN  Take 1 capsule (300 mg total) by mouth 3 (three) times daily. What changed: when to take this   hydrochlorothiazide  25 MG tablet Commonly known as: HYDRODIURIL  TAKE 1 TABLET BY MOUTH IN THE MORNING   HYDROcodone -acetaminophen  10-325 MG tablet Commonly known as: NORCO TAKE 1 TABLET BY MOUTH EVERY 6 HOURS AS NEEDED FOR SEVERE PAIN   icosapent  Ethyl 1 g capsule Commonly known as: VASCEPA  TAKE TWO CAPSULES BY MOUTH TWICE DAILY   lansoprazole  30 MG capsule Commonly known as: PREVACID  Take 1 capsule (30 mg total) by mouth 2 (two) times daily before a meal. What changed: when to take this   linezolid  600 MG tablet Commonly known as: ZYVOX  Take 1 tablet (600 mg total) by  mouth 2 (two) times daily for 5 days.   losartan  50 MG tablet Commonly known as: COZAAR  Take 1 tablet (50 mg total) by mouth daily. What changed:  how much to take when to take this   menthol  3 MG lozenge Commonly known as: CEPACOL Take 1 lozenge (3 mg total) by mouth as needed for sore throat.   metFORMIN  500 MG 24 hr tablet Commonly known as: GLUCOPHAGE -XR TAKE 1 TABLET BY MOUTH TWICE DAILY WITH A MEAL   methocarbamol  500 MG tablet Commonly known as: ROBAXIN  Take 1 tablet (500 mg total) by mouth every 8 (eight) hours as needed for muscle spasms.   metoprolol  tartrate 25 MG tablet Commonly known as: LOPRESSOR  TAKE 1 TABLET BY MOUTH TWICE  DAILY   nitroGLYCERIN  0.4 MG SL tablet Commonly known as: NITROSTAT  Place 0.4 mg under the tongue every 5 (five) minutes as needed for chest pain. Reported on 02/26/2016   NovoLIN  70/30 Kwikpen (70-30) 100 UNIT/ML KwikPen Generic drug: insulin  isophane & regular human KwikPen INJECT 60 UNITS SUBCUTANEOUSLY TWICE DAILY What changed: See the new instructions.   nystatin  100000 UNIT/ML suspension Commonly known as: MYCOSTATIN  Take 5 mLs (500,000 Units total) by mouth 4 (four) times daily for 7 days.   ondansetron  4 MG tablet Commonly known as: ZOFRAN  Take 1 tablet (4 mg total) by mouth every 8 (eight) hours as needed for nausea or vomiting.   polyethylene glycol 17 g packet Commonly known as: MIRALAX  / GLYCOLAX  Take 17 g by mouth daily as needed for moderate constipation.   PRESCRIPTION MEDICATION Take 1 tablet by mouth in the morning and at bedtime. Motillium (domperidone) 10 mg   solifenacin 10 MG tablet Commonly known as: VESICARE Take 10 mg by mouth daily.   tadalafil  5 MG tablet Commonly known as: CIALIS  Take 1 tablet (5 mg total) by mouth daily as needed for erectile dysfunction.               Discharge Care Instructions  (From admission, onward)           Start     Ordered   09/11/24 0000  Discharge wound care:       Comments: Keep wound clean and dry; daily cleans with saline solution recommended; cover with phone dressing and change every 3 days or as needed for swelling.  Avoid tight footwear that can cause friction and skin breakdown.   09/11/24 1350            Follow-up Information     [name], MD. Schedule an appointment as soon as possible for a visit in 10 day(s).   Specialty: Internal Medicine Contact information: [address] [place] [address] [phone]                Discharge Exam: [name]   09/08/24 2300  Weight: 75 kg   General exam: Appears calm and comfortable; in no major distress and feeling  better.  Patient is afebrile.  Positive thrush seen on exam; patient reports son sore throat and difficulty swallowing.  Overall lips significantly less swollen. Respiratory system: Clear to auscultation. Respiratory effort normal.  Good saturation on room air. Cardiovascular system: S1 & S2 heard, RRR. No JVD, murmurs, rubs, gallops or clicks. No pedal edema. Gastrointestinal system: Abdomen is nondistended, soft and nontender. No organomegaly or masses felt. Normal bowel sounds heard. Central nervous system: Moving 4 limbs spontaneously.  No focal neurological deficits. Extremities: No cyanosis or clubbing; no edema on exam.  Right sided lateral malleoli wound without active drainage and mild surrounding erythema present on exam. Skin: No petechiae. Psychiatry: Judgement and insight appear normal. Mood & affect appropriate.   Condition at discharge: Stable and improved.  The results of significant diagnostics from this hospitalization (including imaging, microbiology, ancillary and laboratory) are listed below for reference.   Imaging Studies: ECHOCARDIOGRAM COMPLETE Result Date: 09/09/2024    ECHOCARDIOGRAM REPORT   Patient Name:   [name] Date of Exam: 09/09/2024 Medical Rec #:  [id number]       Height:       69.5 in Accession #:    [id number]      Weight:       165.3 lb Date of Birth:  1961/06/23       BSA:          1.915 m Patient Age:    63 years        BP:           141/90 mmHg Patient Gender: M               HR:           99 bpm. Exam Location:  [facility] Procedure: 2D Echo, Cardiac Doppler and Color Doppler (Both Spectral and Color            Flow Doppler were utilized during procedure). Indications:    Elevated Troponin  History:        Patient has prior history of Echocardiogram examinations, most                 recent 08/25/2018. CAD; Risk Factors:Hypertension, Diabetes,                 Dyslipidemia and Sleep Apnea. Bypass graft stenosis H/O chronic                 pancreatitis.                  Cancer (HCC) (From Hx).  Sonographer:    Aida Pizza RCS Referring Phys: [id number] CAROLE N HALL IMPRESSIONS  1. Left ventricular ejection fraction, by estimation, is 55 to 60%. The left ventricle has normal function. Left ventricular endocardial border not optimally defined to evaluate regional wall motion. There is mild left ventricular hypertrophy. Left ventricular diastolic parameters are consistent with Grade I diastolic dysfunction (impaired relaxation). Elevated left ventricular end-diastolic pressure.  2. Right ventricular systolic function is normal. The right ventricular size is normal. Tricuspid regurgitation signal is inadequate for assessing PA pressure.  3. The mitral valve is normal in structure. No evidence of mitral valve regurgitation. No evidence of mitral stenosis.  4. The aortic valve is tricuspid. Aortic valve regurgitation is not visualized. No aortic stenosis is present.  5. The inferior vena cava is normal in size with greater than 50% respiratory variability, suggesting right atrial pressure of 3 mmHg. Comparison(s): No prior Echocardiogram. FINDINGS  Left Ventricle: Left ventricular ejection fraction, by estimation, is 55 to 60%. The left ventricle has normal function. Left ventricular endocardial border not optimally defined to evaluate regional wall motion. Strain was performed and the global longitudinal strain is indeterminate. The left ventricular internal cavity size was normal in size. There is mild left ventricular hypertrophy. Left ventricular diastolic parameters are consistent with Grade I diastolic dysfunction (impaired relaxation).  Elevated left ventricular end-diastolic pressure. Right Ventricle: The right ventricular size is normal. No increase in right ventricular wall thickness. Right ventricular systolic function is normal.  Tricuspid regurgitation signal is inadequate for assessing PA pressure. Left Atrium: Left atrial size was normal in size. Right Atrium:  Right atrial size was normal in size. Pericardium: There is no evidence of pericardial effusion. Mitral Valve: The mitral valve is normal in structure. No evidence of mitral valve regurgitation. No evidence of mitral valve stenosis. Tricuspid Valve: The tricuspid valve is grossly normal. Tricuspid valve regurgitation is not demonstrated. No evidence of tricuspid stenosis. Aortic Valve: The aortic valve is tricuspid. Aortic valve regurgitation is not visualized. No aortic stenosis is present. Pulmonic Valve: The pulmonic valve was normal in structure. Pulmonic valve regurgitation is trivial. No evidence of pulmonic stenosis. Aorta: The aortic root is normal in size and structure. Venous: The inferior vena cava is normal in size with greater than 50% respiratory variability, suggesting right atrial pressure of 3 mmHg. IAS/Shunts: The atrial septum is grossly normal. Additional Comments: 3D was performed not requiring image post processing on an independent workstation and was indeterminate.  LEFT VENTRICLE PLAX 2D LVIDd:         3.20 cm     Diastology LVIDs:         2.30 cm     LV e' medial:    3.85 cm/s LV PW:         1.20 cm     LV E/e' medial:  20.3 LV IVS:        1.20 cm     LV e' lateral:   5.47 cm/s LVOT diam:     1.90 cm     LV E/e' lateral: 14.3 LV SV:         46 LV SV Index:   24 LVOT Area:     2.84 cm  LV Volumes (MOD) LV vol d, MOD A2C: 43.5 ml LV vol s, MOD A2C: 19.3 ml LV SV MOD A2C:     24.2 ml RIGHT VENTRICLE RV S prime:     8.58 cm/s TAPSE (M-mode): 1.2 cm LEFT ATRIUM             Index        RIGHT ATRIUM           Index LA diam:        3.30 cm 1.72 cm/m   RA Area:     13.60 cm LA Vol (A2C):   46.1 ml 24.07 ml/m  RA Volume:   32.00 ml  16.71 ml/m LA Vol (A4C):   29.3 ml 15.30 ml/m LA Biplane Vol: 38.3 ml 20.00 ml/m  AORTIC VALVE LVOT Vmax:   87.10 cm/s LVOT Vmean:  57.600 cm/s LVOT VTI:    0.162 m  AORTA Ao Root diam: 3.60 cm MITRAL VALVE MV Area (PHT): 3.91 cm     SHUNTS MV Decel Time: 194  msec     Systemic VTI:  0.16 m MV E velocity: 78.10 cm/s   Systemic Diam: 1.90 cm MV A velocity: 112.00 cm/s MV E/A ratio:  0.70 Vishnu Priya Mallipeddi Electronically signed by Diannah Late Mallipeddi Signature Date/Time: 09/09/2024/11:55:27 AM    Final    DG Foot Complete Right Result Date: 09/09/2024 EXAM: 3 OR MORE VIEW(S) XRAY OF THE FOOT 09/09/2024 02:46:36 AM COMPARISON: None available. CLINICAL HISTORY: Wound, open, foot FINDINGS: BONES AND JOINTS: No acute fracture. No malalignment. SOFT TISSUES: Unremarkable. IMPRESSION: 1. No acute osseous abnormality. Electronically signed by: Oneil Devonshire MD 09/09/2024 03:27 AM EST RP Workstation: [id number]   CT Maxillofacial Wo Contrast Result Date: 09/08/2024 EXAM: CT OF THE  FACE WITHOUT CONTRAST 09/08/2024 09:17:43 PM TECHNIQUE: CT of the face was performed without the administration of intravenous contrast. Multiplanar reformatted images are provided for review. Automated exposure control, iterative reconstruction, and/or weight based adjustment of the mA/kV was utilized to reduce the radiation dose to as low as reasonably achievable. COMPARISON: None available. CLINICAL HISTORY: facial abscess FINDINGS: FACIAL BONES: No acute facial fracture. No mandibular dislocation. No suspicious bone lesion. ORBITS: Globes are intact. No acute traumatic injury. No inflammatory change. SINUSES AND MASTOIDS: No acute abnormality. SOFT TISSUES: Mild edema of the upper lip and the lower part of the nose. No fluid collection. IMPRESSION: 1. No fluid collection to suggest abscess. 2. Mild edema of the upper lip and the lower part of the nose. Electronically signed by: Franky Stanford MD 09/08/2024 09:26 PM EST RP Workstation: [id number]   DG Chest Port 1 View Result Date: 09/08/2024 EXAM: 1 VIEW(S) XRAY OF THE CHEST 09/08/2024 07:33:52 PM COMPARISON: CT of the chest 05/23/2006. CLINICAL HISTORY: SOB (shortness of breath). FINDINGS: LUNGS AND PLEURA: No focal pulmonary opacity.  No pleural effusion. No pneumothorax. HEART AND MEDIASTINUM: Status post cardiac surgery. Mild hiatal hernia seen in the paraesophageal region, unchanged. BONES AND SOFT TISSUES: No acute osseous abnormality. IMPRESSION: 1. No acute findings. Electronically signed by: Greig Pique MD 09/08/2024 07:42 PM EST RP Workstation: [id number]    Microbiology: Results for orders placed or performed during the hospital encounter of 09/08/24  MRSA Next Gen by PCR, Nasal     Status: Abnormal   Collection Time: 09/09/24 12:00 AM   Specimen: Nasal Mucosa; Nasal Swab  Result Value Ref Range Status   MRSA by PCR Next Gen DETECTED (A) NOT DETECTED Final    Comment: RESULT CALLED TO, READ BACK BY AND VERIFIED WITH: A KILGORE AT 0815 ON [id number] BY S DALTON (NOTE) The GeneXpert MRSA Assay (FDA approved for NASAL specimens only), is one component of a comprehensive MRSA colonization surveillance program. It is not intended to diagnose MRSA infection nor to guide or monitor treatment for MRSA infections. Test performance is not FDA approved in patients less than 85 years old. Performed at [facility], [address] [place], [place] [address]   Culture, blood (Routine X 2) w Reflex to ID Panel     Status: None (Preliminary result)   Collection Time: 09/09/24  5:43 AM   Specimen: Right Antecubital; Blood  Result Value Ref Range Status   Specimen Description RIGHT ANTECUBITAL BLOOD  Final   Special Requests   Final    BOTTLES DRAWN AEROBIC AND ANAEROBIC Blood Culture adequate volume   Culture   Final    NO GROWTH 2 DAYS Performed at [facility], [address], [place], [place] [address]    Report Status PENDING  Incomplete  Culture, blood (Routine X 2) w Reflex to ID Panel     Status: None (Preliminary result)   Collection Time: 09/09/24  5:49 AM   Specimen: Right Antecubital; Blood  Result Value Ref Range Status   Specimen Description RIGHT ANTECUBITAL BLOOD  Final   Special Requests   Final     BOTTLES DRAWN AEROBIC AND ANAEROBIC Blood Culture results may not be optimal due to an inadequate volume of blood received in culture bottles   Culture   Final    NO GROWTH 2 DAYS Performed at [facility], [address], [place], [place] [address]    Report Status PENDING  Incomplete    Labs: CBC: Recent Labs  Lab 09/08/24 1856  09/09/24 0543 09/10/24 0506  WBC 17.5* 13.6* 6.3  NEUTROABS 11.4* 8.9*  --   HGB 18.6* 17.9* 15.2  HCT 60.8* 58.8* 50.3  MCV 79.8* 80.4 81.5  PLT 422* 334 217   Basic Metabolic Panel: Recent Labs  Lab 09/08/24 1856 09/09/24 0543 09/10/24 0506 09/11/24 1158  NA 139 134* 132*  --   K 3.9 3.6 4.2  --   CL 93* 90* 92*  --   CO2 26 25 29   --   GLUCOSE 76 273* 347* 435*  BUN 32* 35* 30*  --   CREATININE 1.95* 1.58* 1.32*  --   CALCIUM  10.0 9.2 8.4*  --   MG  --  2.0  --   --   PHOS  --  3.9  --   --    Liver Function Tests: Recent Labs  Lab 09/08/24 1856  AST 32  ALT 18  ALKPHOS 99  BILITOT 0.7  PROT 7.9  ALBUMIN 4.1   CBG: Recent Labs  Lab 09/10/24 1109 09/10/24 1603 09/10/24 2109 09/11/24 0750 09/11/24 1116  GLUCAP 411* 283* 213* 173* 429*    Discharge time spent:  35 minutes.  Signed: Eric Nunnery, MD [facility] 09/11/2024 "

## 2024-09-11 NOTE — Progress Notes (Signed)
 Mobility Specialist Progress Note:    09/11/24 1000  Mobility  Activity Ambulated with assistance  Level of Assistance Independent  Assistive Device None  Distance Ambulated (ft) 480 ft  Range of Motion/Exercises Active;All extremities  Activity Response Tolerated well  Mobility Referral Yes  Mobility visit 1 Mobility  Mobility Specialist Start Time (ACUTE ONLY) 1000  Mobility Specialist Stop Time (ACUTE ONLY) 1020  Mobility Specialist Time Calculation (min) (ACUTE ONLY) 20 min   Pt received in bed, agreeable to mobility. Independently able to stand and ambulate with no AD. Tolerated well, asx throughout. Returned supine, all needs met.  Candise Crabtree Mobility Specialist Please contact via Special Educational Needs Teacher or  Rehab office at [phone]

## 2024-09-11 NOTE — Care Management Important Message (Signed)
 Important Message  Patient Details  Name: [name] MRN: [id number] Date of Birth: October 12, 1960   Important Message Given:  N/A - LOS <3 / Initial given by admissions     [name] Ada 09/11/2024, 2:30 PM

## 2024-09-11 NOTE — Plan of Care (Signed)
 " Problem: Education: Goal: Knowledge of General Education information will improve Description: Including pain rating scale, medication(s)/side effects and non-pharmacologic comfort measures 09/11/2024 1405 by Mathews Norleen POUR, RN Outcome: Adequate for Discharge 09/11/2024 1001 by Mathews Norleen POUR, RN Outcome: Progressing   Problem: Health Behavior/Discharge Planning: Goal: Ability to manage health-related needs will improve 09/11/2024 1405 by Mathews Norleen POUR, RN Outcome: Adequate for Discharge 09/11/2024 1001 by Mathews Norleen POUR, RN Outcome: Progressing   Problem: Clinical Measurements: Goal: Ability to maintain clinical measurements within normal limits will improve 09/11/2024 1405 by Mathews Norleen POUR, RN Outcome: Adequate for Discharge 09/11/2024 1001 by Mathews Norleen POUR, RN Outcome: Progressing Goal: Will remain free from infection 09/11/2024 1405 by Mathews Norleen POUR, RN Outcome: Adequate for Discharge 09/11/2024 1001 by Mathews Norleen POUR, RN Outcome: Progressing Goal: Diagnostic test results will improve 09/11/2024 1405 by Mathews Norleen POUR, RN Outcome: Adequate for Discharge 09/11/2024 1001 by Mathews Norleen POUR, RN Outcome: Progressing Goal: Respiratory complications will improve 09/11/2024 1405 by Mathews Norleen POUR, RN Outcome: Adequate for Discharge 09/11/2024 1001 by Mathews Norleen POUR, RN Outcome: Progressing Goal: Cardiovascular complication will be avoided 09/11/2024 1405 by Mathews Norleen POUR, RN Outcome: Adequate for Discharge 09/11/2024 1001 by Mathews Norleen POUR, RN Outcome: Progressing   Problem: Activity: Goal: Risk for activity intolerance will decrease 09/11/2024 1405 by Mathews Norleen POUR, RN Outcome: Adequate for Discharge 09/11/2024 1001 by Mathews Norleen POUR, RN Outcome: Progressing   Problem: Nutrition: Goal: Adequate nutrition will be maintained 09/11/2024 1405 by Mathews Norleen POUR, RN Outcome: Adequate for Discharge 09/11/2024 1001 by Mathews Norleen POUR, RN Outcome: Progressing   Problem: Coping: Goal: Level of anxiety  will decrease 09/11/2024 1405 by Mathews Norleen POUR, RN Outcome: Adequate for Discharge 09/11/2024 1001 by Mathews Norleen POUR, RN Outcome: Progressing   Problem: Elimination: Goal: Will not experience complications related to bowel motility 09/11/2024 1405 by Mathews Norleen POUR, RN Outcome: Adequate for Discharge 09/11/2024 1001 by Mathews Norleen POUR, RN Outcome: Progressing Goal: Will not experience complications related to urinary retention 09/11/2024 1405 by Mathews Norleen POUR, RN Outcome: Adequate for Discharge 09/11/2024 1001 by Mathews Norleen POUR, RN Outcome: Progressing   Problem: Pain Managment: Goal: General experience of comfort will improve and/or be controlled 09/11/2024 1405 by Mathews Norleen POUR, RN Outcome: Adequate for Discharge 09/11/2024 1001 by Mathews Norleen POUR, RN Outcome: Progressing   Problem: Safety: Goal: Ability to remain free from injury will improve 09/11/2024 1405 by Mathews Norleen POUR, RN Outcome: Adequate for Discharge 09/11/2024 1001 by Mathews Norleen POUR, RN Outcome: Progressing   Problem: Skin Integrity: Goal: Risk for impaired skin integrity will decrease 09/11/2024 1405 by Mathews Norleen POUR, RN Outcome: Adequate for Discharge 09/11/2024 1001 by Mathews Norleen POUR, RN Outcome: Progressing   Problem: Education: Goal: Ability to describe self-care measures that may prevent or decrease complications (Diabetes Survival Skills Education) will improve 09/11/2024 1405 by Mathews Norleen POUR, RN Outcome: Adequate for Discharge 09/11/2024 1001 by Mathews Norleen POUR, RN Outcome: Progressing Goal: Individualized Educational Video(s) 09/11/2024 1405 by Mathews Norleen POUR, RN Outcome: Adequate for Discharge 09/11/2024 1001 by Mathews Norleen POUR, RN Outcome: Progressing   Problem: Coping: Goal: Ability to adjust to condition or change in health will improve 09/11/2024 1405 by Mathews Norleen POUR, RN Outcome: Adequate for Discharge 09/11/2024 1001 by Mathews Norleen POUR, RN Outcome: Progressing   Problem: Fluid Volume: Goal: Ability to maintain  a balanced intake and output will improve 09/11/2024 1405 by Mathews Norleen POUR, RN Outcome: Adequate for Discharge  09/11/2024 1001 by Mathews Norleen POUR, RN Outcome: Progressing   Problem: Health Behavior/Discharge Planning: Goal: Ability to identify and utilize available resources and services will improve 09/11/2024 1405 by Mathews Norleen POUR, RN Outcome: Adequate for Discharge 09/11/2024 1001 by Mathews Norleen POUR, RN Outcome: Progressing Goal: Ability to manage health-related needs will improve 09/11/2024 1405 by Mathews Norleen POUR, RN Outcome: Adequate for Discharge 09/11/2024 1001 by Mathews Norleen POUR, RN Outcome: Progressing   Problem: Metabolic: Goal: Ability to maintain appropriate glucose levels will improve 09/11/2024 1405 by Mathews Norleen POUR, RN Outcome: Adequate for Discharge 09/11/2024 1001 by Mathews Norleen POUR, RN Outcome: Progressing   Problem: Nutritional: Goal: Maintenance of adequate nutrition will improve 09/11/2024 1405 by Mathews Norleen POUR, RN Outcome: Adequate for Discharge 09/11/2024 1001 by Mathews Norleen POUR, RN Outcome: Progressing Goal: Progress toward achieving an optimal weight will improve 09/11/2024 1405 by Mathews Norleen POUR, RN Outcome: Adequate for Discharge 09/11/2024 1001 by Mathews Norleen POUR, RN Outcome: Progressing   Problem: Skin Integrity: Goal: Risk for impaired skin integrity will decrease 09/11/2024 1405 by Mathews Norleen POUR, RN Outcome: Adequate for Discharge 09/11/2024 1001 by Mathews Norleen POUR, RN Outcome: Progressing   Problem: Tissue Perfusion: Goal: Adequacy of tissue perfusion will improve 09/11/2024 1405 by Mathews Norleen POUR, RN Outcome: Adequate for Discharge 09/11/2024 1001 by Mathews Norleen POUR, RN Outcome: Progressing   "

## 2024-09-14 LAB — CULTURE, BLOOD (ROUTINE X 2)
Culture: NO GROWTH
Culture: NO GROWTH
Special Requests: ADEQUATE

## 2024-09-25 ENCOUNTER — Other Ambulatory Visit: Payer: Self-pay | Admitting: Internal Medicine

## 2024-09-25 DIAGNOSIS — R1013 Epigastric pain: Secondary | ICD-10-CM

## 2024-09-26 ENCOUNTER — Encounter: Payer: Self-pay | Admitting: Internal Medicine

## 2024-09-26 NOTE — Telephone Encounter (Signed)
 Called patient left voicemail and sent mychart message schedule an appointment.

## 2024-10-02 ENCOUNTER — Ambulatory Visit: Payer: Self-pay | Admitting: Internal Medicine

## 2024-10-03 ENCOUNTER — Ambulatory Visit: Admitting: Urology

## 2024-10-09 ENCOUNTER — Institutional Professional Consult (permissible substitution) (HOSPITAL_BASED_OUTPATIENT_CLINIC_OR_DEPARTMENT_OTHER): Admitting: Internal Medicine

## 2024-10-17 ENCOUNTER — Ambulatory Visit: Admitting: Cardiology

## 2024-11-14 ENCOUNTER — Ambulatory Visit: Payer: Self-pay | Admitting: Internal Medicine

## 2024-12-03 ENCOUNTER — Ambulatory Visit
